# Patient Record
Sex: Female | Born: 1975
Health system: Southern US, Community
[De-identification: ages and names within clinical notes are randomized; demographics above are authoritative.]

## PROBLEM LIST (undated history)

## (undated) DIAGNOSIS — F988 Other specified behavioral and emotional disorders with onset usually occurring in childhood and adolescence: Secondary | ICD-10-CM

## (undated) DIAGNOSIS — B019 Varicella without complication: Secondary | ICD-10-CM

## (undated) DIAGNOSIS — U071 COVID-19: Secondary | ICD-10-CM

## (undated) DIAGNOSIS — F32A Depression, unspecified: Secondary | ICD-10-CM

## (undated) DIAGNOSIS — N393 Stress incontinence (female) (male): Secondary | ICD-10-CM

## (undated) DIAGNOSIS — M869 Osteomyelitis, unspecified: Secondary | ICD-10-CM

## (undated) DIAGNOSIS — J45909 Unspecified asthma, uncomplicated: Secondary | ICD-10-CM

## (undated) DIAGNOSIS — F329 Major depressive disorder, single episode, unspecified: Secondary | ICD-10-CM

## (undated) DIAGNOSIS — F419 Anxiety disorder, unspecified: Secondary | ICD-10-CM

## (undated) DIAGNOSIS — G7100 Muscular dystrophy, unspecified: Secondary | ICD-10-CM

## (undated) DIAGNOSIS — J309 Allergic rhinitis, unspecified: Secondary | ICD-10-CM

## (undated) DIAGNOSIS — S93326A Dislocation of tarsometatarsal joint of unspecified foot, initial encounter: Secondary | ICD-10-CM

## (undated) DIAGNOSIS — G600 Hereditary motor and sensory neuropathy: Secondary | ICD-10-CM

## (undated) HISTORY — DX: Osteomyelitis, unspecified: M86.9

## (undated) HISTORY — DX: Anxiety disorder, unspecified: F41.9

## (undated) HISTORY — DX: Unspecified asthma, uncomplicated: J45.909

## (undated) HISTORY — DX: Major depressive disorder, single episode, unspecified: F32.9

## (undated) HISTORY — DX: Other specified behavioral and emotional disorders with onset usually occurring in childhood and adolescence: F98.8

## (undated) HISTORY — DX: COVID-19: U07.1

## (undated) HISTORY — DX: Allergic rhinitis, unspecified: J30.9

## (undated) HISTORY — DX: Varicella without complication: B01.9

## (undated) HISTORY — DX: Hereditary motor and sensory neuropathy: G60.0

## (undated) HISTORY — DX: Dislocation of tarsometatarsal joint of unspecified foot, initial encounter: S93.326A

## (undated) HISTORY — DX: Depression, unspecified: F32.A

## (undated) HISTORY — DX: Muscular dystrophy, unspecified: G71.00

## (undated) HISTORY — DX: Stress incontinence (female) (male): N39.3

---
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
## 1993-01-18 HISTORY — PX: ECTOPIC PREGNANCY SURGERY: SHX613

## 2003-01-19 HISTORY — PX: TUBAL LIGATION: SHX77

## 2004-03-13 ENCOUNTER — Ambulatory Visit: Payer: Self-pay | Admitting: Internal Medicine

## 2004-07-19 ENCOUNTER — Emergency Department: Payer: Self-pay | Admitting: Emergency Medicine

## 2004-12-24 ENCOUNTER — Ambulatory Visit: Payer: Self-pay | Admitting: Unknown Physician Specialty

## 2005-01-25 ENCOUNTER — Ambulatory Visit: Payer: Self-pay | Admitting: Pain Medicine

## 2005-03-03 ENCOUNTER — Ambulatory Visit: Payer: Self-pay | Admitting: Pain Medicine

## 2007-02-22 ENCOUNTER — Emergency Department: Payer: Self-pay | Admitting: Emergency Medicine

## 2007-11-03 ENCOUNTER — Emergency Department: Payer: Self-pay | Admitting: Emergency Medicine

## 2008-06-05 ENCOUNTER — Emergency Department: Payer: Self-pay | Admitting: Emergency Medicine

## 2008-07-25 ENCOUNTER — Emergency Department: Payer: Self-pay | Admitting: Emergency Medicine

## 2008-08-01 ENCOUNTER — Emergency Department: Payer: Self-pay | Admitting: Unknown Physician Specialty

## 2008-11-21 ENCOUNTER — Ambulatory Visit: Payer: Self-pay | Admitting: Internal Medicine

## 2008-12-05 ENCOUNTER — Ambulatory Visit: Payer: Self-pay | Admitting: Internal Medicine

## 2008-12-20 ENCOUNTER — Ambulatory Visit: Payer: Self-pay | Admitting: Podiatrist

## 2009-03-31 ENCOUNTER — Ambulatory Visit: Payer: Self-pay | Admitting: Family Medicine

## 2009-04-18 HISTORY — PX: OTHER SURGICAL HISTORY: SHX169

## 2009-05-12 ENCOUNTER — Inpatient Hospital Stay: Payer: Self-pay | Admitting: Internal Medicine

## 2009-07-12 ENCOUNTER — Emergency Department: Payer: Self-pay | Admitting: Unknown Physician Specialty

## 2009-07-15 ENCOUNTER — Ambulatory Visit: Payer: Self-pay | Admitting: Podiatrist

## 2009-11-14 ENCOUNTER — Emergency Department: Payer: Self-pay | Admitting: Emergency Medicine

## 2010-03-17 ENCOUNTER — Other Ambulatory Visit: Payer: Self-pay | Admitting: Podiatrist

## 2010-03-17 DIAGNOSIS — M869 Osteomyelitis, unspecified: Secondary | ICD-10-CM

## 2010-03-17 DIAGNOSIS — S92309A Fracture of unspecified metatarsal bone(s), unspecified foot, initial encounter for closed fracture: Secondary | ICD-10-CM

## 2010-03-20 ENCOUNTER — Ambulatory Visit
Admission: RE | Admit: 2010-03-20 | Discharge: 2010-03-20 | Disposition: A | Discharge status: Home / Self Care | Payer: PRIVATE HEALTH INSURANCE | Source: Ambulatory Visit | Attending: Podiatrist | Admitting: Podiatrist

## 2010-03-20 DIAGNOSIS — M869 Osteomyelitis, unspecified: Secondary | ICD-10-CM

## 2010-03-20 DIAGNOSIS — S92309A Fracture of unspecified metatarsal bone(s), unspecified foot, initial encounter for closed fracture: Secondary | ICD-10-CM

## 2010-03-20 MED ORDER — GADOBENATE DIMEGLUMINE 529 MG/ML IV SOLN: 6.0000 mL | Freq: Once | INTRAVENOUS | Status: AC | PRN

## 2010-07-06 ENCOUNTER — Emergency Department: Payer: Self-pay

## 2010-07-16 ENCOUNTER — Ambulatory Visit: Payer: Self-pay

## 2010-07-21 ENCOUNTER — Ambulatory Visit: Payer: No Typology Code available for payment source | Admitting: Family Medicine

## 2010-07-21 DIAGNOSIS — Z0289 Encounter for other administrative examinations: Secondary | ICD-10-CM

## 2010-10-24 ENCOUNTER — Emergency Department: Payer: Self-pay | Admitting: Emergency Medicine

## 2010-12-20 ENCOUNTER — Emergency Department: Payer: Self-pay | Admitting: Emergency Medicine

## 2010-12-21 DIAGNOSIS — D499 Neoplasm of unspecified behavior of unspecified site: Secondary | ICD-10-CM | POA: Insufficient documentation

## 2010-12-21 DIAGNOSIS — G130 Paraneoplastic neuromyopathy and neuropathy: Secondary | ICD-10-CM

## 2010-12-21 DIAGNOSIS — L97519 Non-pressure chronic ulcer of other part of right foot with unspecified severity: Secondary | ICD-10-CM | POA: Insufficient documentation

## 2010-12-21 DIAGNOSIS — L97509 Non-pressure chronic ulcer of other part of unspecified foot with unspecified severity: Secondary | ICD-10-CM | POA: Insufficient documentation

## 2011-03-04 DIAGNOSIS — M2060 Acquired deformities of toe(s), unspecified, unspecified foot: Secondary | ICD-10-CM | POA: Insufficient documentation

## 2011-03-18 DIAGNOSIS — M1460 Charcot's joint, unspecified site: Secondary | ICD-10-CM | POA: Insufficient documentation

## 2011-03-18 DIAGNOSIS — S93326A Dislocation of tarsometatarsal joint of unspecified foot, initial encounter: Secondary | ICD-10-CM | POA: Insufficient documentation

## 2011-04-08 DIAGNOSIS — G622 Polyneuropathy due to other toxic agents: Secondary | ICD-10-CM

## 2011-04-08 DIAGNOSIS — G619 Inflammatory polyneuropathy, unspecified: Secondary | ICD-10-CM | POA: Insufficient documentation

## 2011-07-23 ENCOUNTER — Emergency Department: Payer: Self-pay | Admitting: Emergency Medicine

## 2011-07-23 LAB — CBC WITH DIFFERENTIAL/PLATELET
Basophil #: 0.1 10*3/uL (ref 0.0–0.1)
Basophil %: 0.6 %
Eosinophil #: 0 10*3/uL (ref 0.0–0.7)
Eosinophil %: 0.4 %
HCT: 38.7 % (ref 35.0–47.0)
HGB: 12.4 g/dL (ref 12.0–16.0)
Lymphocyte #: 2.3 10*3/uL (ref 1.0–3.6)
Lymphocyte %: 25.1 %
MCH: 29.3 pg (ref 26.0–34.0)
MCHC: 32.1 g/dL (ref 32.0–36.0)
MCV: 91 fL (ref 80–100)
Monocyte #: 0.4 x10 3/mm (ref 0.2–0.9)
Monocyte %: 4.8 %
Neutrophil #: 6.3 10*3/uL (ref 1.4–6.5)
Neutrophil %: 69.1 %
Platelet: 296 10*3/uL (ref 150–440)
RBC: 4.24 10*6/uL (ref 3.80–5.20)
RDW: 18.3 % — ABNORMAL HIGH (ref 11.5–14.5)
WBC: 9.1 10*3/uL (ref 3.6–11.0)

## 2011-07-23 LAB — BASIC METABOLIC PANEL
Anion Gap: 3 — ABNORMAL LOW (ref 7–16)
BUN: 4 mg/dL — ABNORMAL LOW (ref 7–18)
Calcium, Total: 8.7 mg/dL (ref 8.5–10.1)
Chloride: 103 mmol/L (ref 98–107)
Co2: 29 mmol/L (ref 21–32)
Creatinine: 0.6 mg/dL (ref 0.60–1.30)
EGFR (African American): >60
EGFR (Non-African Amer.): >60
Glucose: 115 mg/dL — ABNORMAL HIGH (ref 65–99)
Osmolality: 268 (ref 275–301)
Potassium: 3.4 mmol/L — ABNORMAL LOW (ref 3.5–5.1)
Sodium: 135 mmol/L — ABNORMAL LOW (ref 136–145)

## 2011-07-23 LAB — SEDIMENTATION RATE: Erythrocyte Sed Rate: 33 mm/hr — ABNORMAL HIGH (ref 0–20)

## 2011-08-04 ENCOUNTER — Emergency Department: Payer: Self-pay | Admitting: *Deleted

## 2011-08-04 ENCOUNTER — Ambulatory Visit: Payer: Self-pay | Admitting: Family Medicine

## 2011-09-30 DIAGNOSIS — Z79899 Other long term (current) drug therapy: Secondary | ICD-10-CM | POA: Insufficient documentation

## 2011-10-21 DIAGNOSIS — IMO0002 Reserved for concepts with insufficient information to code with codable children: Secondary | ICD-10-CM | POA: Insufficient documentation

## 2011-10-21 DIAGNOSIS — L03119 Cellulitis of unspecified part of limb: Secondary | ICD-10-CM

## 2011-10-21 DIAGNOSIS — L02619 Cutaneous abscess of unspecified foot: Secondary | ICD-10-CM | POA: Insufficient documentation

## 2011-10-22 ENCOUNTER — Emergency Department: Payer: Self-pay | Admitting: Internal Medicine

## 2011-10-22 LAB — CBC WITH DIFFERENTIAL/PLATELET
Basophil #: 0.1 10*3/uL (ref 0.0–0.1)
Basophil %: 1.3 %
Eosinophil #: 0.1 10*3/uL (ref 0.0–0.7)
Eosinophil %: 1 %
HCT: 36.2 % (ref 35.0–47.0)
HGB: 11.8 g/dL — ABNORMAL LOW (ref 12.0–16.0)
Lymphocyte #: 2.2 10*3/uL (ref 1.0–3.6)
Lymphocyte %: 29.4 %
MCH: 27.7 pg (ref 26.0–34.0)
MCHC: 32.7 g/dL (ref 32.0–36.0)
MCV: 85 fL (ref 80–100)
Monocyte #: 0.6 x10 3/mm (ref 0.2–0.9)
Monocyte %: 8.4 %
Neutrophil #: 4.4 10*3/uL (ref 1.4–6.5)
Neutrophil %: 59.9 %
Platelet: 411 10*3/uL (ref 150–440)
RBC: 4.27 10*6/uL (ref 3.80–5.20)
RDW: 17.3 % — ABNORMAL HIGH (ref 11.5–14.5)
WBC: 7.4 10*3/uL (ref 3.6–11.0)

## 2011-10-22 LAB — BASIC METABOLIC PANEL
Anion Gap: 9 (ref 7–16)
BUN: 4 mg/dL — ABNORMAL LOW (ref 7–18)
Calcium, Total: 8.9 mg/dL (ref 8.5–10.1)
Chloride: 104 mmol/L (ref 98–107)
Co2: 25 mmol/L (ref 21–32)
Creatinine: 0.53 mg/dL — ABNORMAL LOW (ref 0.60–1.30)
EGFR (African American): >60
EGFR (Non-African Amer.): >60
Glucose: 141 mg/dL — ABNORMAL HIGH (ref 65–99)
Osmolality: 275 (ref 275–301)
Potassium: 3.8 mmol/L (ref 3.5–5.1)
Sodium: 138 mmol/L (ref 136–145)

## 2012-06-25 ENCOUNTER — Emergency Department: Payer: Self-pay | Admitting: Unknown Physician Specialty

## 2012-08-11 ENCOUNTER — Emergency Department: Payer: Self-pay | Admitting: Emergency Medicine

## 2012-08-25 DIAGNOSIS — G8929 Other chronic pain: Secondary | ICD-10-CM | POA: Insufficient documentation

## 2012-08-25 DIAGNOSIS — M79673 Pain in unspecified foot: Secondary | ICD-10-CM | POA: Insufficient documentation

## 2012-09-18 ENCOUNTER — Emergency Department: Payer: Self-pay | Admitting: Emergency Medicine

## 2013-01-25 DIAGNOSIS — M545 Low back pain, unspecified: Secondary | ICD-10-CM | POA: Diagnosis not present

## 2013-01-25 DIAGNOSIS — G7109 Other specified muscular dystrophies: Secondary | ICD-10-CM | POA: Diagnosis not present

## 2013-01-25 DIAGNOSIS — M79609 Pain in unspecified limb: Secondary | ICD-10-CM | POA: Diagnosis not present

## 2013-01-25 DIAGNOSIS — F17200 Nicotine dependence, unspecified, uncomplicated: Secondary | ICD-10-CM | POA: Diagnosis not present

## 2013-01-25 DIAGNOSIS — Z79899 Other long term (current) drug therapy: Secondary | ICD-10-CM | POA: Diagnosis not present

## 2013-01-25 DIAGNOSIS — Z87891 Personal history of nicotine dependence: Secondary | ICD-10-CM | POA: Diagnosis not present

## 2013-02-07 DIAGNOSIS — F17200 Nicotine dependence, unspecified, uncomplicated: Secondary | ICD-10-CM | POA: Diagnosis not present

## 2013-02-07 DIAGNOSIS — M545 Low back pain, unspecified: Secondary | ICD-10-CM | POA: Diagnosis not present

## 2013-02-07 DIAGNOSIS — M79609 Pain in unspecified limb: Secondary | ICD-10-CM | POA: Diagnosis not present

## 2013-02-07 DIAGNOSIS — Z87891 Personal history of nicotine dependence: Secondary | ICD-10-CM | POA: Diagnosis not present

## 2013-02-07 DIAGNOSIS — Z79899 Other long term (current) drug therapy: Secondary | ICD-10-CM | POA: Diagnosis not present

## 2013-02-07 DIAGNOSIS — G7109 Other specified muscular dystrophies: Secondary | ICD-10-CM | POA: Diagnosis not present

## 2013-02-27 DIAGNOSIS — Z87891 Personal history of nicotine dependence: Secondary | ICD-10-CM | POA: Diagnosis not present

## 2013-02-27 DIAGNOSIS — Z79899 Other long term (current) drug therapy: Secondary | ICD-10-CM | POA: Diagnosis not present

## 2013-02-27 DIAGNOSIS — F17200 Nicotine dependence, unspecified, uncomplicated: Secondary | ICD-10-CM | POA: Diagnosis not present

## 2013-02-27 DIAGNOSIS — M79609 Pain in unspecified limb: Secondary | ICD-10-CM | POA: Diagnosis not present

## 2013-02-27 DIAGNOSIS — G7109 Other specified muscular dystrophies: Secondary | ICD-10-CM | POA: Diagnosis not present

## 2013-02-27 DIAGNOSIS — M545 Low back pain, unspecified: Secondary | ICD-10-CM | POA: Diagnosis not present

## 2013-03-19 ENCOUNTER — Ambulatory Visit: Payer: Self-pay | Admitting: Physician Assistant

## 2013-03-19 DIAGNOSIS — R059 Cough, unspecified: Secondary | ICD-10-CM | POA: Diagnosis not present

## 2013-03-19 DIAGNOSIS — J020 Streptococcal pharyngitis: Secondary | ICD-10-CM | POA: Diagnosis not present

## 2013-03-19 DIAGNOSIS — M869 Osteomyelitis, unspecified: Secondary | ICD-10-CM | POA: Diagnosis not present

## 2013-03-19 DIAGNOSIS — R05 Cough: Secondary | ICD-10-CM | POA: Diagnosis not present

## 2013-03-19 DIAGNOSIS — J069 Acute upper respiratory infection, unspecified: Secondary | ICD-10-CM | POA: Diagnosis not present

## 2013-03-19 DIAGNOSIS — Z79899 Other long term (current) drug therapy: Secondary | ICD-10-CM | POA: Diagnosis not present

## 2013-03-19 LAB — RAPID STREP-A WITH REFLX: Micro Text Report: POSITIVE

## 2013-03-21 DIAGNOSIS — M545 Low back pain, unspecified: Secondary | ICD-10-CM | POA: Diagnosis not present

## 2013-03-21 DIAGNOSIS — M79609 Pain in unspecified limb: Secondary | ICD-10-CM | POA: Diagnosis not present

## 2013-03-21 DIAGNOSIS — Z87891 Personal history of nicotine dependence: Secondary | ICD-10-CM | POA: Diagnosis not present

## 2013-03-21 DIAGNOSIS — G7109 Other specified muscular dystrophies: Secondary | ICD-10-CM | POA: Diagnosis not present

## 2013-03-21 DIAGNOSIS — F17200 Nicotine dependence, unspecified, uncomplicated: Secondary | ICD-10-CM | POA: Diagnosis not present

## 2013-03-21 DIAGNOSIS — Z79899 Other long term (current) drug therapy: Secondary | ICD-10-CM | POA: Diagnosis not present

## 2013-03-23 DIAGNOSIS — M79609 Pain in unspecified limb: Secondary | ICD-10-CM | POA: Diagnosis not present

## 2013-03-23 DIAGNOSIS — G7109 Other specified muscular dystrophies: Secondary | ICD-10-CM | POA: Diagnosis not present

## 2013-03-23 DIAGNOSIS — M545 Low back pain, unspecified: Secondary | ICD-10-CM | POA: Diagnosis not present

## 2013-03-23 DIAGNOSIS — Z79899 Other long term (current) drug therapy: Secondary | ICD-10-CM | POA: Diagnosis not present

## 2013-03-23 DIAGNOSIS — Z87891 Personal history of nicotine dependence: Secondary | ICD-10-CM | POA: Diagnosis not present

## 2013-03-23 DIAGNOSIS — F17200 Nicotine dependence, unspecified, uncomplicated: Secondary | ICD-10-CM | POA: Diagnosis not present

## 2013-04-02 DIAGNOSIS — M545 Low back pain, unspecified: Secondary | ICD-10-CM | POA: Diagnosis not present

## 2013-04-02 DIAGNOSIS — F17200 Nicotine dependence, unspecified, uncomplicated: Secondary | ICD-10-CM | POA: Diagnosis not present

## 2013-04-02 DIAGNOSIS — M79609 Pain in unspecified limb: Secondary | ICD-10-CM | POA: Diagnosis not present

## 2013-04-02 DIAGNOSIS — Z87891 Personal history of nicotine dependence: Secondary | ICD-10-CM | POA: Diagnosis not present

## 2013-04-02 DIAGNOSIS — Z79899 Other long term (current) drug therapy: Secondary | ICD-10-CM | POA: Diagnosis not present

## 2013-04-02 DIAGNOSIS — G894 Chronic pain syndrome: Secondary | ICD-10-CM | POA: Diagnosis not present

## 2013-04-02 DIAGNOSIS — G7109 Other specified muscular dystrophies: Secondary | ICD-10-CM | POA: Diagnosis not present

## 2013-04-05 DIAGNOSIS — G8929 Other chronic pain: Secondary | ICD-10-CM | POA: Diagnosis not present

## 2013-04-05 DIAGNOSIS — L989 Disorder of the skin and subcutaneous tissue, unspecified: Secondary | ICD-10-CM | POA: Diagnosis not present

## 2013-04-05 DIAGNOSIS — Z91199 Patient's noncompliance with other medical treatment and regimen due to unspecified reason: Secondary | ICD-10-CM | POA: Diagnosis not present

## 2013-04-05 DIAGNOSIS — L97509 Non-pressure chronic ulcer of other part of unspecified foot with unspecified severity: Secondary | ICD-10-CM | POA: Diagnosis not present

## 2013-04-05 DIAGNOSIS — Z9119 Patient's noncompliance with other medical treatment and regimen: Secondary | ICD-10-CM | POA: Diagnosis not present

## 2013-04-15 ENCOUNTER — Emergency Department: Payer: Self-pay | Admitting: Internal Medicine

## 2013-04-15 DIAGNOSIS — L98499 Non-pressure chronic ulcer of skin of other sites with unspecified severity: Secondary | ICD-10-CM | POA: Diagnosis not present

## 2013-04-15 DIAGNOSIS — R110 Nausea: Secondary | ICD-10-CM | POA: Diagnosis not present

## 2013-04-15 DIAGNOSIS — S91309A Unspecified open wound, unspecified foot, initial encounter: Secondary | ICD-10-CM | POA: Diagnosis not present

## 2013-04-15 DIAGNOSIS — L97509 Non-pressure chronic ulcer of other part of unspecified foot with unspecified severity: Secondary | ICD-10-CM | POA: Diagnosis not present

## 2013-04-15 LAB — CBC WITH DIFFERENTIAL/PLATELET
Basophil #: 0.1 10*3/uL (ref 0.0–0.1)
Basophil %: 1 %
Eosinophil #: 0.1 10*3/uL (ref 0.0–0.7)
Eosinophil %: 0.9 %
HCT: 37.9 % (ref 35.0–47.0)
HGB: 12.6 g/dL (ref 12.0–16.0)
Lymphocyte #: 3.6 10*3/uL (ref 1.0–3.6)
Lymphocyte %: 38.1 %
MCH: 26.4 pg (ref 26.0–34.0)
MCHC: 33.2 g/dL (ref 32.0–36.0)
MCV: 79 fL — ABNORMAL LOW (ref 80–100)
Monocyte #: 0.5 x10 3/mm (ref 0.2–0.9)
Monocyte %: 5.5 %
Neutrophil #: 5.1 10*3/uL (ref 1.4–6.5)
Neutrophil %: 54.5 %
Platelet: 277 10*3/uL (ref 150–440)
RBC: 4.77 10*6/uL (ref 3.80–5.20)
RDW: 19.9 % — ABNORMAL HIGH (ref 11.5–14.5)
WBC: 9.4 10*3/uL (ref 3.6–11.0)

## 2013-04-15 LAB — COMPREHENSIVE METABOLIC PANEL
Albumin: 3.2 g/dL — ABNORMAL LOW (ref 3.4–5.0)
Alkaline Phosphatase: 64 U/L
Anion Gap: 4 — ABNORMAL LOW (ref 7–16)
BUN: 5 mg/dL — ABNORMAL LOW (ref 7–18)
Bilirubin,Total: 0.4 mg/dL (ref 0.2–1.0)
Calcium, Total: 8.7 mg/dL (ref 8.5–10.1)
Chloride: 101 mmol/L (ref 98–107)
Co2: 28 mmol/L (ref 21–32)
Creatinine: 0.57 mg/dL — ABNORMAL LOW (ref 0.60–1.30)
EGFR (African American): >60
EGFR (Non-African Amer.): >60
Glucose: 96 mg/dL (ref 65–99)
Osmolality: 263 (ref 275–301)
Potassium: 3.7 mmol/L (ref 3.5–5.1)
SGOT(AST): 8 U/L — ABNORMAL LOW (ref 15–37)
SGPT (ALT): 13 U/L (ref 12–78)
Sodium: 133 mmol/L — ABNORMAL LOW (ref 136–145)
Total Protein: 7.5 g/dL (ref 6.4–8.2)

## 2013-04-18 DIAGNOSIS — G600 Hereditary motor and sensory neuropathy: Secondary | ICD-10-CM | POA: Diagnosis not present

## 2013-04-24 DIAGNOSIS — Z79899 Other long term (current) drug therapy: Secondary | ICD-10-CM | POA: Diagnosis not present

## 2013-04-24 DIAGNOSIS — Z87891 Personal history of nicotine dependence: Secondary | ICD-10-CM | POA: Diagnosis not present

## 2013-04-24 DIAGNOSIS — F17200 Nicotine dependence, unspecified, uncomplicated: Secondary | ICD-10-CM | POA: Diagnosis not present

## 2013-04-24 DIAGNOSIS — M79609 Pain in unspecified limb: Secondary | ICD-10-CM | POA: Diagnosis not present

## 2013-04-24 DIAGNOSIS — M545 Low back pain, unspecified: Secondary | ICD-10-CM | POA: Diagnosis not present

## 2013-04-24 DIAGNOSIS — G7109 Other specified muscular dystrophies: Secondary | ICD-10-CM | POA: Diagnosis not present

## 2013-04-28 DIAGNOSIS — M948X9 Other specified disorders of cartilage, unspecified sites: Secondary | ICD-10-CM | POA: Diagnosis not present

## 2013-04-28 DIAGNOSIS — B958 Unspecified staphylococcus as the cause of diseases classified elsewhere: Secondary | ICD-10-CM | POA: Diagnosis not present

## 2013-04-28 DIAGNOSIS — M79609 Pain in unspecified limb: Secondary | ICD-10-CM | POA: Diagnosis not present

## 2013-04-28 DIAGNOSIS — T797XXA Traumatic subcutaneous emphysema, initial encounter: Secondary | ICD-10-CM | POA: Diagnosis not present

## 2013-04-28 DIAGNOSIS — S91109A Unspecified open wound of unspecified toe(s) without damage to nail, initial encounter: Secondary | ICD-10-CM | POA: Diagnosis not present

## 2013-04-28 DIAGNOSIS — M869 Osteomyelitis, unspecified: Secondary | ICD-10-CM | POA: Diagnosis not present

## 2013-04-28 DIAGNOSIS — S98119A Complete traumatic amputation of unspecified great toe, initial encounter: Secondary | ICD-10-CM | POA: Diagnosis not present

## 2013-04-28 DIAGNOSIS — I739 Peripheral vascular disease, unspecified: Secondary | ICD-10-CM | POA: Diagnosis not present

## 2013-04-28 DIAGNOSIS — L98499 Non-pressure chronic ulcer of skin of other sites with unspecified severity: Secondary | ICD-10-CM | POA: Diagnosis not present

## 2013-04-28 DIAGNOSIS — F17200 Nicotine dependence, unspecified, uncomplicated: Secondary | ICD-10-CM | POA: Diagnosis not present

## 2013-04-28 DIAGNOSIS — Z79899 Other long term (current) drug therapy: Secondary | ICD-10-CM | POA: Diagnosis not present

## 2013-04-28 DIAGNOSIS — L089 Local infection of the skin and subcutaneous tissue, unspecified: Secondary | ICD-10-CM | POA: Diagnosis not present

## 2013-05-08 DIAGNOSIS — F17200 Nicotine dependence, unspecified, uncomplicated: Secondary | ICD-10-CM | POA: Diagnosis not present

## 2013-05-08 DIAGNOSIS — Z87891 Personal history of nicotine dependence: Secondary | ICD-10-CM | POA: Diagnosis not present

## 2013-05-08 DIAGNOSIS — Z79899 Other long term (current) drug therapy: Secondary | ICD-10-CM | POA: Diagnosis not present

## 2013-05-08 DIAGNOSIS — M545 Low back pain, unspecified: Secondary | ICD-10-CM | POA: Diagnosis not present

## 2013-05-08 DIAGNOSIS — M79609 Pain in unspecified limb: Secondary | ICD-10-CM | POA: Diagnosis not present

## 2013-05-08 DIAGNOSIS — G7109 Other specified muscular dystrophies: Secondary | ICD-10-CM | POA: Diagnosis not present

## 2013-06-13 DIAGNOSIS — M545 Low back pain, unspecified: Secondary | ICD-10-CM | POA: Diagnosis not present

## 2013-06-13 DIAGNOSIS — Z87891 Personal history of nicotine dependence: Secondary | ICD-10-CM | POA: Diagnosis not present

## 2013-06-13 DIAGNOSIS — M79609 Pain in unspecified limb: Secondary | ICD-10-CM | POA: Diagnosis not present

## 2013-06-13 DIAGNOSIS — G7109 Other specified muscular dystrophies: Secondary | ICD-10-CM | POA: Diagnosis not present

## 2013-06-13 DIAGNOSIS — Z79899 Other long term (current) drug therapy: Secondary | ICD-10-CM | POA: Diagnosis not present

## 2013-06-13 DIAGNOSIS — F17200 Nicotine dependence, unspecified, uncomplicated: Secondary | ICD-10-CM | POA: Diagnosis not present

## 2013-07-09 ENCOUNTER — Emergency Department: Payer: Self-pay | Admitting: Emergency Medicine

## 2013-07-09 DIAGNOSIS — S98119A Complete traumatic amputation of unspecified great toe, initial encounter: Secondary | ICD-10-CM | POA: Diagnosis not present

## 2013-07-09 DIAGNOSIS — G7109 Other specified muscular dystrophies: Secondary | ICD-10-CM | POA: Diagnosis present

## 2013-07-09 DIAGNOSIS — R110 Nausea: Secondary | ICD-10-CM | POA: Diagnosis not present

## 2013-07-09 DIAGNOSIS — R9431 Abnormal electrocardiogram [ECG] [EKG]: Secondary | ICD-10-CM | POA: Diagnosis not present

## 2013-07-09 DIAGNOSIS — F329 Major depressive disorder, single episode, unspecified: Secondary | ICD-10-CM | POA: Diagnosis present

## 2013-07-09 DIAGNOSIS — Z8614 Personal history of Methicillin resistant Staphylococcus aureus infection: Secondary | ICD-10-CM | POA: Diagnosis not present

## 2013-07-09 DIAGNOSIS — I4510 Unspecified right bundle-branch block: Secondary | ICD-10-CM | POA: Diagnosis not present

## 2013-07-09 DIAGNOSIS — L97409 Non-pressure chronic ulcer of unspecified heel and midfoot with unspecified severity: Secondary | ICD-10-CM | POA: Diagnosis not present

## 2013-07-09 DIAGNOSIS — M869 Osteomyelitis, unspecified: Secondary | ICD-10-CM | POA: Diagnosis not present

## 2013-07-09 DIAGNOSIS — I96 Gangrene, not elsewhere classified: Secondary | ICD-10-CM | POA: Diagnosis present

## 2013-07-09 DIAGNOSIS — G8918 Other acute postprocedural pain: Secondary | ICD-10-CM | POA: Diagnosis not present

## 2013-07-09 DIAGNOSIS — L089 Local infection of the skin and subcutaneous tissue, unspecified: Secondary | ICD-10-CM | POA: Diagnosis not present

## 2013-07-09 DIAGNOSIS — G600 Hereditary motor and sensory neuropathy: Secondary | ICD-10-CM | POA: Diagnosis not present

## 2013-07-09 DIAGNOSIS — F3289 Other specified depressive episodes: Secondary | ICD-10-CM | POA: Diagnosis present

## 2013-07-09 DIAGNOSIS — F17200 Nicotine dependence, unspecified, uncomplicated: Secondary | ICD-10-CM | POA: Diagnosis not present

## 2013-07-09 DIAGNOSIS — L97509 Non-pressure chronic ulcer of other part of unspecified foot with unspecified severity: Secondary | ICD-10-CM | POA: Diagnosis not present

## 2013-07-09 DIAGNOSIS — M948X9 Other specified disorders of cartilage, unspecified sites: Secondary | ICD-10-CM | POA: Diagnosis not present

## 2013-07-09 DIAGNOSIS — Z79899 Other long term (current) drug therapy: Secondary | ICD-10-CM | POA: Diagnosis not present

## 2013-07-09 DIAGNOSIS — L02619 Cutaneous abscess of unspecified foot: Secondary | ICD-10-CM | POA: Diagnosis present

## 2013-07-09 DIAGNOSIS — M79609 Pain in unspecified limb: Secondary | ICD-10-CM | POA: Diagnosis not present

## 2013-07-09 DIAGNOSIS — Z880 Allergy status to penicillin: Secondary | ICD-10-CM | POA: Diagnosis not present

## 2013-07-09 DIAGNOSIS — L03039 Cellulitis of unspecified toe: Secondary | ICD-10-CM | POA: Diagnosis present

## 2013-07-09 DIAGNOSIS — G8929 Other chronic pain: Secondary | ICD-10-CM | POA: Diagnosis present

## 2013-07-09 DIAGNOSIS — L03119 Cellulitis of unspecified part of limb: Secondary | ICD-10-CM | POA: Diagnosis not present

## 2013-07-09 LAB — CBC WITH DIFFERENTIAL/PLATELET
Basophil #: 0.1 10*3/uL (ref 0.0–0.1)
Basophil %: 1.4 %
Eosinophil #: 0 10*3/uL (ref 0.0–0.7)
Eosinophil %: 0.5 %
HCT: 37.6 % (ref 35.0–47.0)
HGB: 12.3 g/dL (ref 12.0–16.0)
Lymphocyte #: 2.5 10*3/uL (ref 1.0–3.6)
Lymphocyte %: 25.9 %
MCH: 25.4 pg — ABNORMAL LOW (ref 26.0–34.0)
MCHC: 32.6 g/dL (ref 32.0–36.0)
MCV: 78 fL — ABNORMAL LOW (ref 80–100)
Monocyte #: 0.5 x10 3/mm (ref 0.2–0.9)
Monocyte %: 4.9 %
Neutrophil #: 6.5 10*3/uL (ref 1.4–6.5)
Neutrophil %: 67.3 %
Platelet: 341 10*3/uL (ref 150–440)
RBC: 4.83 10*6/uL (ref 3.80–5.20)
RDW: 18.2 % — ABNORMAL HIGH (ref 11.5–14.5)
WBC: 9.6 10*3/uL (ref 3.6–11.0)

## 2013-07-09 LAB — COMPREHENSIVE METABOLIC PANEL
Albumin: 2.7 g/dL — ABNORMAL LOW (ref 3.4–5.0)
Alkaline Phosphatase: 75 U/L
Anion Gap: 3 — ABNORMAL LOW (ref 7–16)
BUN: 3 mg/dL — ABNORMAL LOW (ref 7–18)
Bilirubin,Total: 0.3 mg/dL (ref 0.2–1.0)
Calcium, Total: 9.2 mg/dL (ref 8.5–10.1)
Chloride: 104 mmol/L (ref 98–107)
Co2: 28 mmol/L (ref 21–32)
Creatinine: 0.56 mg/dL — ABNORMAL LOW (ref 0.60–1.30)
EGFR (African American): >60
EGFR (Non-African Amer.): >60
Glucose: 94 mg/dL (ref 65–99)
Osmolality: 266 (ref 275–301)
Potassium: 4.8 mmol/L (ref 3.5–5.1)
SGOT(AST): 13 U/L — ABNORMAL LOW (ref 15–37)
SGPT (ALT): 9 U/L — ABNORMAL LOW (ref 12–78)
Sodium: 135 mmol/L — ABNORMAL LOW (ref 136–145)
Total Protein: 7.6 g/dL (ref 6.4–8.2)

## 2013-07-11 DIAGNOSIS — Z720 Tobacco use: Secondary | ICD-10-CM | POA: Insufficient documentation

## 2013-07-16 DIAGNOSIS — M869 Osteomyelitis, unspecified: Secondary | ICD-10-CM

## 2013-07-16 HISTORY — DX: Osteomyelitis, unspecified: M86.9

## 2013-07-18 DIAGNOSIS — G988 Other disorders of nervous system: Secondary | ICD-10-CM | POA: Diagnosis not present

## 2013-07-18 DIAGNOSIS — E1159 Type 2 diabetes mellitus with other circulatory complications: Secondary | ICD-10-CM | POA: Diagnosis not present

## 2013-07-18 DIAGNOSIS — T8740 Infection of amputation stump, unspecified extremity: Secondary | ICD-10-CM | POA: Diagnosis not present

## 2013-07-18 DIAGNOSIS — F17200 Nicotine dependence, unspecified, uncomplicated: Secondary | ICD-10-CM | POA: Diagnosis not present

## 2013-07-18 DIAGNOSIS — S98119A Complete traumatic amputation of unspecified great toe, initial encounter: Secondary | ICD-10-CM | POA: Diagnosis not present

## 2013-07-18 DIAGNOSIS — G600 Hereditary motor and sensory neuropathy: Secondary | ICD-10-CM | POA: Diagnosis not present

## 2013-07-18 DIAGNOSIS — I798 Other disorders of arteries, arterioles and capillaries in diseases classified elsewhere: Secondary | ICD-10-CM | POA: Diagnosis not present

## 2013-07-19 DIAGNOSIS — G988 Other disorders of nervous system: Secondary | ICD-10-CM | POA: Diagnosis not present

## 2013-07-19 DIAGNOSIS — G600 Hereditary motor and sensory neuropathy: Secondary | ICD-10-CM | POA: Diagnosis not present

## 2013-07-19 DIAGNOSIS — F17200 Nicotine dependence, unspecified, uncomplicated: Secondary | ICD-10-CM | POA: Diagnosis not present

## 2013-07-19 DIAGNOSIS — E1159 Type 2 diabetes mellitus with other circulatory complications: Secondary | ICD-10-CM | POA: Diagnosis not present

## 2013-07-19 DIAGNOSIS — T8740 Infection of amputation stump, unspecified extremity: Secondary | ICD-10-CM | POA: Diagnosis not present

## 2013-07-19 DIAGNOSIS — I798 Other disorders of arteries, arterioles and capillaries in diseases classified elsewhere: Secondary | ICD-10-CM | POA: Diagnosis not present

## 2013-07-20 DIAGNOSIS — G600 Hereditary motor and sensory neuropathy: Secondary | ICD-10-CM | POA: Diagnosis not present

## 2013-07-20 DIAGNOSIS — E1159 Type 2 diabetes mellitus with other circulatory complications: Secondary | ICD-10-CM | POA: Diagnosis not present

## 2013-07-20 DIAGNOSIS — G988 Other disorders of nervous system: Secondary | ICD-10-CM | POA: Diagnosis not present

## 2013-07-20 DIAGNOSIS — I798 Other disorders of arteries, arterioles and capillaries in diseases classified elsewhere: Secondary | ICD-10-CM | POA: Diagnosis not present

## 2013-07-20 DIAGNOSIS — F17200 Nicotine dependence, unspecified, uncomplicated: Secondary | ICD-10-CM | POA: Diagnosis not present

## 2013-07-20 DIAGNOSIS — T8740 Infection of amputation stump, unspecified extremity: Secondary | ICD-10-CM | POA: Diagnosis not present

## 2013-07-23 DIAGNOSIS — G600 Hereditary motor and sensory neuropathy: Secondary | ICD-10-CM | POA: Diagnosis not present

## 2013-07-23 DIAGNOSIS — G988 Other disorders of nervous system: Secondary | ICD-10-CM | POA: Diagnosis not present

## 2013-07-23 DIAGNOSIS — E1159 Type 2 diabetes mellitus with other circulatory complications: Secondary | ICD-10-CM | POA: Diagnosis not present

## 2013-07-23 DIAGNOSIS — T8740 Infection of amputation stump, unspecified extremity: Secondary | ICD-10-CM | POA: Diagnosis not present

## 2013-07-23 DIAGNOSIS — I798 Other disorders of arteries, arterioles and capillaries in diseases classified elsewhere: Secondary | ICD-10-CM | POA: Diagnosis not present

## 2013-07-23 DIAGNOSIS — F17200 Nicotine dependence, unspecified, uncomplicated: Secondary | ICD-10-CM | POA: Diagnosis not present

## 2013-07-25 DIAGNOSIS — G988 Other disorders of nervous system: Secondary | ICD-10-CM | POA: Diagnosis not present

## 2013-07-25 DIAGNOSIS — F17200 Nicotine dependence, unspecified, uncomplicated: Secondary | ICD-10-CM | POA: Diagnosis not present

## 2013-07-25 DIAGNOSIS — G600 Hereditary motor and sensory neuropathy: Secondary | ICD-10-CM | POA: Diagnosis not present

## 2013-07-25 DIAGNOSIS — I798 Other disorders of arteries, arterioles and capillaries in diseases classified elsewhere: Secondary | ICD-10-CM | POA: Diagnosis not present

## 2013-07-25 DIAGNOSIS — E1159 Type 2 diabetes mellitus with other circulatory complications: Secondary | ICD-10-CM | POA: Diagnosis not present

## 2013-07-25 DIAGNOSIS — T8740 Infection of amputation stump, unspecified extremity: Secondary | ICD-10-CM | POA: Diagnosis not present

## 2013-07-27 DIAGNOSIS — G600 Hereditary motor and sensory neuropathy: Secondary | ICD-10-CM | POA: Diagnosis not present

## 2013-07-27 DIAGNOSIS — T8740 Infection of amputation stump, unspecified extremity: Secondary | ICD-10-CM | POA: Diagnosis not present

## 2013-07-27 DIAGNOSIS — E1159 Type 2 diabetes mellitus with other circulatory complications: Secondary | ICD-10-CM | POA: Diagnosis not present

## 2013-07-27 DIAGNOSIS — F17200 Nicotine dependence, unspecified, uncomplicated: Secondary | ICD-10-CM | POA: Diagnosis not present

## 2013-07-27 DIAGNOSIS — G988 Other disorders of nervous system: Secondary | ICD-10-CM | POA: Diagnosis not present

## 2013-07-27 DIAGNOSIS — I798 Other disorders of arteries, arterioles and capillaries in diseases classified elsewhere: Secondary | ICD-10-CM | POA: Diagnosis not present

## 2013-07-30 DIAGNOSIS — F17200 Nicotine dependence, unspecified, uncomplicated: Secondary | ICD-10-CM | POA: Diagnosis not present

## 2013-07-30 DIAGNOSIS — E1159 Type 2 diabetes mellitus with other circulatory complications: Secondary | ICD-10-CM | POA: Diagnosis not present

## 2013-07-30 DIAGNOSIS — T8740 Infection of amputation stump, unspecified extremity: Secondary | ICD-10-CM | POA: Diagnosis not present

## 2013-07-30 DIAGNOSIS — I798 Other disorders of arteries, arterioles and capillaries in diseases classified elsewhere: Secondary | ICD-10-CM | POA: Diagnosis not present

## 2013-07-30 DIAGNOSIS — G988 Other disorders of nervous system: Secondary | ICD-10-CM | POA: Diagnosis not present

## 2013-07-30 DIAGNOSIS — G600 Hereditary motor and sensory neuropathy: Secondary | ICD-10-CM | POA: Diagnosis not present

## 2013-08-01 DIAGNOSIS — L97509 Non-pressure chronic ulcer of other part of unspecified foot with unspecified severity: Secondary | ICD-10-CM | POA: Diagnosis not present

## 2013-08-03 DIAGNOSIS — E1159 Type 2 diabetes mellitus with other circulatory complications: Secondary | ICD-10-CM | POA: Diagnosis not present

## 2013-08-03 DIAGNOSIS — G988 Other disorders of nervous system: Secondary | ICD-10-CM | POA: Diagnosis not present

## 2013-08-03 DIAGNOSIS — I798 Other disorders of arteries, arterioles and capillaries in diseases classified elsewhere: Secondary | ICD-10-CM | POA: Diagnosis not present

## 2013-08-03 DIAGNOSIS — T8740 Infection of amputation stump, unspecified extremity: Secondary | ICD-10-CM | POA: Diagnosis not present

## 2013-08-03 DIAGNOSIS — G600 Hereditary motor and sensory neuropathy: Secondary | ICD-10-CM | POA: Diagnosis not present

## 2013-08-03 DIAGNOSIS — F17200 Nicotine dependence, unspecified, uncomplicated: Secondary | ICD-10-CM | POA: Diagnosis not present

## 2013-08-08 DIAGNOSIS — G600 Hereditary motor and sensory neuropathy: Secondary | ICD-10-CM | POA: Diagnosis not present

## 2013-08-08 DIAGNOSIS — T8789 Other complications of amputation stump: Secondary | ICD-10-CM | POA: Diagnosis not present

## 2013-08-08 DIAGNOSIS — M869 Osteomyelitis, unspecified: Secondary | ICD-10-CM | POA: Diagnosis not present

## 2013-08-08 DIAGNOSIS — G589 Mononeuropathy, unspecified: Secondary | ICD-10-CM | POA: Diagnosis not present

## 2013-08-08 DIAGNOSIS — L97509 Non-pressure chronic ulcer of other part of unspecified foot with unspecified severity: Secondary | ICD-10-CM | POA: Diagnosis not present

## 2013-08-13 DIAGNOSIS — F17200 Nicotine dependence, unspecified, uncomplicated: Secondary | ICD-10-CM | POA: Diagnosis not present

## 2013-08-13 DIAGNOSIS — G988 Other disorders of nervous system: Secondary | ICD-10-CM | POA: Diagnosis not present

## 2013-08-13 DIAGNOSIS — T8740 Infection of amputation stump, unspecified extremity: Secondary | ICD-10-CM | POA: Diagnosis not present

## 2013-08-13 DIAGNOSIS — E1159 Type 2 diabetes mellitus with other circulatory complications: Secondary | ICD-10-CM | POA: Diagnosis not present

## 2013-08-13 DIAGNOSIS — G600 Hereditary motor and sensory neuropathy: Secondary | ICD-10-CM | POA: Diagnosis not present

## 2013-08-13 DIAGNOSIS — I798 Other disorders of arteries, arterioles and capillaries in diseases classified elsewhere: Secondary | ICD-10-CM | POA: Diagnosis not present

## 2013-08-15 DIAGNOSIS — T8740 Infection of amputation stump, unspecified extremity: Secondary | ICD-10-CM | POA: Diagnosis not present

## 2013-08-16 DIAGNOSIS — L8990 Pressure ulcer of unspecified site, unspecified stage: Secondary | ICD-10-CM | POA: Diagnosis not present

## 2013-08-16 DIAGNOSIS — G5790 Unspecified mononeuropathy of unspecified lower limb: Secondary | ICD-10-CM | POA: Diagnosis not present

## 2013-08-16 DIAGNOSIS — T8789 Other complications of amputation stump: Secondary | ICD-10-CM | POA: Diagnosis not present

## 2013-08-16 DIAGNOSIS — L97509 Non-pressure chronic ulcer of other part of unspecified foot with unspecified severity: Secondary | ICD-10-CM | POA: Diagnosis not present

## 2013-08-16 DIAGNOSIS — L89899 Pressure ulcer of other site, unspecified stage: Secondary | ICD-10-CM | POA: Diagnosis not present

## 2013-08-16 DIAGNOSIS — Z8739 Personal history of other diseases of the musculoskeletal system and connective tissue: Secondary | ICD-10-CM | POA: Diagnosis not present

## 2013-08-16 DIAGNOSIS — Z8619 Personal history of other infectious and parasitic diseases: Secondary | ICD-10-CM | POA: Diagnosis not present

## 2013-08-16 DIAGNOSIS — G600 Hereditary motor and sensory neuropathy: Secondary | ICD-10-CM | POA: Diagnosis not present

## 2013-08-21 DIAGNOSIS — F17200 Nicotine dependence, unspecified, uncomplicated: Secondary | ICD-10-CM | POA: Diagnosis not present

## 2013-08-21 DIAGNOSIS — G8918 Other acute postprocedural pain: Secondary | ICD-10-CM | POA: Diagnosis not present

## 2013-08-21 DIAGNOSIS — G894 Chronic pain syndrome: Secondary | ICD-10-CM | POA: Diagnosis not present

## 2013-08-21 DIAGNOSIS — M79609 Pain in unspecified limb: Secondary | ICD-10-CM | POA: Diagnosis not present

## 2013-08-21 DIAGNOSIS — Z79899 Other long term (current) drug therapy: Secondary | ICD-10-CM | POA: Diagnosis not present

## 2013-08-21 DIAGNOSIS — Z87891 Personal history of nicotine dependence: Secondary | ICD-10-CM | POA: Diagnosis not present

## 2013-08-25 ENCOUNTER — Emergency Department: Payer: Self-pay | Admitting: Emergency Medicine

## 2013-08-25 DIAGNOSIS — Z880 Allergy status to penicillin: Secondary | ICD-10-CM | POA: Diagnosis not present

## 2013-08-25 DIAGNOSIS — S98119A Complete traumatic amputation of unspecified great toe, initial encounter: Secondary | ICD-10-CM | POA: Diagnosis not present

## 2013-08-25 DIAGNOSIS — M869 Osteomyelitis, unspecified: Secondary | ICD-10-CM | POA: Diagnosis not present

## 2013-08-25 DIAGNOSIS — M79609 Pain in unspecified limb: Secondary | ICD-10-CM | POA: Diagnosis not present

## 2013-08-25 DIAGNOSIS — S88919A Complete traumatic amputation of unspecified lower leg, level unspecified, initial encounter: Secondary | ICD-10-CM | POA: Diagnosis not present

## 2013-08-25 DIAGNOSIS — M7730 Calcaneal spur, unspecified foot: Secondary | ICD-10-CM | POA: Diagnosis not present

## 2013-08-25 DIAGNOSIS — M25579 Pain in unspecified ankle and joints of unspecified foot: Secondary | ICD-10-CM | POA: Diagnosis not present

## 2013-08-25 DIAGNOSIS — F17200 Nicotine dependence, unspecified, uncomplicated: Secondary | ICD-10-CM | POA: Diagnosis not present

## 2013-08-25 LAB — BASIC METABOLIC PANEL
Anion Gap: 6 — ABNORMAL LOW (ref 7–16)
BUN: 5 mg/dL — ABNORMAL LOW (ref 7–18)
Calcium, Total: 8.5 mg/dL (ref 8.5–10.1)
Chloride: 105 mmol/L (ref 98–107)
Co2: 26 mmol/L (ref 21–32)
Creatinine: 0.5 mg/dL — ABNORMAL LOW (ref 0.60–1.30)
EGFR (African American): >60
EGFR (Non-African Amer.): >60
Glucose: 98 mg/dL (ref 65–99)
Osmolality: 271 (ref 275–301)
Potassium: 3.8 mmol/L (ref 3.5–5.1)
Sodium: 137 mmol/L (ref 136–145)

## 2013-08-25 LAB — CBC WITH DIFFERENTIAL/PLATELET
Basophil #: 0.1 10*3/uL (ref 0.0–0.1)
Basophil %: 0.5 %
Eosinophil #: 0 10*3/uL (ref 0.0–0.7)
Eosinophil %: 0.1 %
HCT: 36.1 % (ref 35.0–47.0)
HGB: 11.6 g/dL — ABNORMAL LOW (ref 12.0–16.0)
Lymphocyte #: 1.4 10*3/uL (ref 1.0–3.6)
Lymphocyte %: 10.1 %
MCH: 24.5 pg — ABNORMAL LOW (ref 26.0–34.0)
MCHC: 32.2 g/dL (ref 32.0–36.0)
MCV: 76 fL — ABNORMAL LOW (ref 80–100)
Monocyte #: 0.6 x10 3/mm (ref 0.2–0.9)
Monocyte %: 4.4 %
Neutrophil #: 12.2 10*3/uL — ABNORMAL HIGH (ref 1.4–6.5)
Neutrophil %: 84.9 %
Platelet: 290 10*3/uL (ref 150–440)
RBC: 4.75 10*6/uL (ref 3.80–5.20)
RDW: 19 % — ABNORMAL HIGH (ref 11.5–14.5)
WBC: 14.3 10*3/uL — ABNORMAL HIGH (ref 3.6–11.0)

## 2013-08-27 DIAGNOSIS — L97509 Non-pressure chronic ulcer of other part of unspecified foot with unspecified severity: Secondary | ICD-10-CM | POA: Diagnosis not present

## 2013-08-27 DIAGNOSIS — G600 Hereditary motor and sensory neuropathy: Secondary | ICD-10-CM | POA: Diagnosis not present

## 2013-08-27 DIAGNOSIS — G8929 Other chronic pain: Secondary | ICD-10-CM | POA: Diagnosis not present

## 2013-08-27 DIAGNOSIS — F17200 Nicotine dependence, unspecified, uncomplicated: Secondary | ICD-10-CM | POA: Diagnosis not present

## 2013-08-30 DIAGNOSIS — F17200 Nicotine dependence, unspecified, uncomplicated: Secondary | ICD-10-CM | POA: Diagnosis not present

## 2013-08-30 DIAGNOSIS — A5211 Tabes dorsalis: Secondary | ICD-10-CM | POA: Diagnosis not present

## 2013-08-30 DIAGNOSIS — G894 Chronic pain syndrome: Secondary | ICD-10-CM | POA: Diagnosis not present

## 2013-08-30 DIAGNOSIS — Z79899 Other long term (current) drug therapy: Secondary | ICD-10-CM | POA: Diagnosis not present

## 2013-08-30 DIAGNOSIS — F4322 Adjustment disorder with anxiety: Secondary | ICD-10-CM | POA: Diagnosis not present

## 2013-08-30 DIAGNOSIS — R32 Unspecified urinary incontinence: Secondary | ICD-10-CM | POA: Diagnosis not present

## 2013-08-30 DIAGNOSIS — F321 Major depressive disorder, single episode, moderate: Secondary | ICD-10-CM | POA: Diagnosis not present

## 2013-09-12 ENCOUNTER — Ambulatory Visit: Payer: Self-pay | Admitting: Physician Assistant

## 2013-09-12 DIAGNOSIS — S93609A Unspecified sprain of unspecified foot, initial encounter: Secondary | ICD-10-CM | POA: Diagnosis not present

## 2013-09-12 DIAGNOSIS — M7730 Calcaneal spur, unspecified foot: Secondary | ICD-10-CM | POA: Diagnosis not present

## 2013-09-13 DIAGNOSIS — Z87891 Personal history of nicotine dependence: Secondary | ICD-10-CM | POA: Diagnosis not present

## 2013-09-13 DIAGNOSIS — G894 Chronic pain syndrome: Secondary | ICD-10-CM | POA: Diagnosis not present

## 2013-09-13 DIAGNOSIS — M79609 Pain in unspecified limb: Secondary | ICD-10-CM | POA: Diagnosis not present

## 2013-09-13 DIAGNOSIS — F17200 Nicotine dependence, unspecified, uncomplicated: Secondary | ICD-10-CM | POA: Diagnosis not present

## 2013-09-13 DIAGNOSIS — Z79899 Other long term (current) drug therapy: Secondary | ICD-10-CM | POA: Diagnosis not present

## 2013-09-13 DIAGNOSIS — G8918 Other acute postprocedural pain: Secondary | ICD-10-CM | POA: Diagnosis not present

## 2013-10-01 ENCOUNTER — Emergency Department: Payer: Self-pay | Admitting: Emergency Medicine

## 2013-10-01 DIAGNOSIS — K0510 Chronic gingivitis, plaque induced: Secondary | ICD-10-CM | POA: Diagnosis not present

## 2013-10-01 DIAGNOSIS — K089 Disorder of teeth and supporting structures, unspecified: Secondary | ICD-10-CM | POA: Diagnosis not present

## 2013-10-01 DIAGNOSIS — R6884 Jaw pain: Secondary | ICD-10-CM | POA: Diagnosis not present

## 2013-10-01 DIAGNOSIS — K0500 Acute gingivitis, plaque induced: Secondary | ICD-10-CM | POA: Diagnosis not present

## 2013-10-01 DIAGNOSIS — K0381 Cracked tooth: Secondary | ICD-10-CM | POA: Diagnosis not present

## 2013-10-01 DIAGNOSIS — K029 Dental caries, unspecified: Secondary | ICD-10-CM | POA: Diagnosis not present

## 2013-10-01 DIAGNOSIS — K0520 Aggressive periodontitis, unspecified: Secondary | ICD-10-CM | POA: Diagnosis not present

## 2013-10-01 DIAGNOSIS — Z880 Allergy status to penicillin: Secondary | ICD-10-CM | POA: Diagnosis not present

## 2013-10-01 DIAGNOSIS — K0530 Chronic periodontitis, unspecified: Secondary | ICD-10-CM | POA: Diagnosis not present

## 2013-10-10 DIAGNOSIS — M79609 Pain in unspecified limb: Secondary | ICD-10-CM | POA: Diagnosis not present

## 2013-10-10 DIAGNOSIS — Z79899 Other long term (current) drug therapy: Secondary | ICD-10-CM | POA: Diagnosis not present

## 2013-10-10 DIAGNOSIS — G8918 Other acute postprocedural pain: Secondary | ICD-10-CM | POA: Diagnosis not present

## 2013-10-10 DIAGNOSIS — G894 Chronic pain syndrome: Secondary | ICD-10-CM | POA: Diagnosis not present

## 2013-10-18 HISTORY — PX: OTHER SURGICAL HISTORY: SHX169

## 2013-11-10 ENCOUNTER — Emergency Department: Payer: Self-pay | Admitting: Emergency Medicine

## 2013-11-10 DIAGNOSIS — K029 Dental caries, unspecified: Secondary | ICD-10-CM | POA: Diagnosis not present

## 2013-11-10 DIAGNOSIS — A691 Other Vincent's infections: Secondary | ICD-10-CM | POA: Diagnosis not present

## 2013-11-10 DIAGNOSIS — Z720 Tobacco use: Secondary | ICD-10-CM | POA: Diagnosis not present

## 2013-11-10 DIAGNOSIS — F419 Anxiety disorder, unspecified: Secondary | ICD-10-CM | POA: Diagnosis not present

## 2013-11-10 DIAGNOSIS — R51 Headache: Secondary | ICD-10-CM | POA: Diagnosis not present

## 2013-11-10 DIAGNOSIS — R6884 Jaw pain: Secondary | ICD-10-CM | POA: Diagnosis not present

## 2013-11-10 DIAGNOSIS — K0381 Cracked tooth: Secondary | ICD-10-CM | POA: Diagnosis not present

## 2013-11-14 DIAGNOSIS — M79609 Pain in unspecified limb: Secondary | ICD-10-CM | POA: Diagnosis not present

## 2013-11-14 DIAGNOSIS — G8918 Other acute postprocedural pain: Secondary | ICD-10-CM | POA: Diagnosis not present

## 2013-11-14 DIAGNOSIS — Z79891 Long term (current) use of opiate analgesic: Secondary | ICD-10-CM | POA: Diagnosis not present

## 2013-11-14 DIAGNOSIS — G8929 Other chronic pain: Secondary | ICD-10-CM | POA: Diagnosis not present

## 2013-11-14 DIAGNOSIS — G894 Chronic pain syndrome: Secondary | ICD-10-CM | POA: Diagnosis not present

## 2013-12-04 DIAGNOSIS — M2042 Other hammer toe(s) (acquired), left foot: Secondary | ICD-10-CM | POA: Diagnosis not present

## 2013-12-04 DIAGNOSIS — M1460 Charcot's joint, unspecified site: Secondary | ICD-10-CM | POA: Diagnosis not present

## 2013-12-07 DIAGNOSIS — L03211 Cellulitis of face: Secondary | ICD-10-CM | POA: Diagnosis not present

## 2013-12-07 DIAGNOSIS — B029 Zoster without complications: Secondary | ICD-10-CM | POA: Diagnosis not present

## 2013-12-25 DIAGNOSIS — Z79899 Other long term (current) drug therapy: Secondary | ICD-10-CM | POA: Diagnosis not present

## 2013-12-25 DIAGNOSIS — G5792 Unspecified mononeuropathy of left lower limb: Secondary | ICD-10-CM | POA: Diagnosis not present

## 2013-12-25 DIAGNOSIS — M353 Polymyalgia rheumatica: Secondary | ICD-10-CM | POA: Diagnosis not present

## 2013-12-25 DIAGNOSIS — M2550 Pain in unspecified joint: Secondary | ICD-10-CM | POA: Diagnosis not present

## 2013-12-25 DIAGNOSIS — G5791 Unspecified mononeuropathy of right lower limb: Secondary | ICD-10-CM | POA: Diagnosis not present

## 2013-12-25 DIAGNOSIS — Z79891 Long term (current) use of opiate analgesic: Secondary | ICD-10-CM | POA: Diagnosis not present

## 2013-12-25 DIAGNOSIS — G894 Chronic pain syndrome: Secondary | ICD-10-CM | POA: Diagnosis not present

## 2013-12-25 DIAGNOSIS — M1990 Unspecified osteoarthritis, unspecified site: Secondary | ICD-10-CM | POA: Diagnosis not present

## 2014-01-22 DIAGNOSIS — Z79899 Other long term (current) drug therapy: Secondary | ICD-10-CM | POA: Diagnosis not present

## 2014-05-08 DIAGNOSIS — K219 Gastro-esophageal reflux disease without esophagitis: Secondary | ICD-10-CM | POA: Diagnosis not present

## 2014-05-08 DIAGNOSIS — M14672 Charcot's joint, left ankle and foot: Secondary | ICD-10-CM | POA: Diagnosis not present

## 2014-05-08 DIAGNOSIS — M14671 Charcot's joint, right ankle and foot: Secondary | ICD-10-CM | POA: Diagnosis not present

## 2014-05-08 DIAGNOSIS — N393 Stress incontinence (female) (male): Secondary | ICD-10-CM | POA: Diagnosis not present

## 2014-09-11 DIAGNOSIS — F1120 Opioid dependence, uncomplicated: Secondary | ICD-10-CM | POA: Insufficient documentation

## 2014-10-21 DIAGNOSIS — M545 Low back pain: Secondary | ICD-10-CM | POA: Diagnosis not present

## 2014-10-21 DIAGNOSIS — Z79899 Other long term (current) drug therapy: Secondary | ICD-10-CM | POA: Diagnosis not present

## 2014-10-21 DIAGNOSIS — G894 Chronic pain syndrome: Secondary | ICD-10-CM | POA: Diagnosis not present

## 2014-10-21 DIAGNOSIS — M4697 Unspecified inflammatory spondylopathy, lumbosacral region: Secondary | ICD-10-CM | POA: Diagnosis not present

## 2014-10-21 DIAGNOSIS — M792 Neuralgia and neuritis, unspecified: Secondary | ICD-10-CM | POA: Diagnosis not present

## 2014-11-18 DIAGNOSIS — G894 Chronic pain syndrome: Secondary | ICD-10-CM | POA: Diagnosis not present

## 2014-11-18 DIAGNOSIS — Z79899 Other long term (current) drug therapy: Secondary | ICD-10-CM | POA: Diagnosis not present

## 2014-11-18 DIAGNOSIS — M4697 Unspecified inflammatory spondylopathy, lumbosacral region: Secondary | ICD-10-CM | POA: Diagnosis not present

## 2014-11-18 DIAGNOSIS — M545 Low back pain: Secondary | ICD-10-CM | POA: Diagnosis not present

## 2014-11-18 DIAGNOSIS — M792 Neuralgia and neuritis, unspecified: Secondary | ICD-10-CM | POA: Diagnosis not present

## 2014-12-04 DIAGNOSIS — M14671 Charcot's joint, right ankle and foot: Secondary | ICD-10-CM | POA: Diagnosis not present

## 2014-12-04 DIAGNOSIS — M14672 Charcot's joint, left ankle and foot: Secondary | ICD-10-CM | POA: Diagnosis not present

## 2014-12-04 DIAGNOSIS — G894 Chronic pain syndrome: Secondary | ICD-10-CM | POA: Diagnosis not present

## 2014-12-26 ENCOUNTER — Ambulatory Visit (INDEPENDENT_AMBULATORY_CARE_PROVIDER_SITE_OTHER): Payer: Medicare Other | Admitting: Family Medicine

## 2014-12-26 ENCOUNTER — Encounter: Payer: Self-pay | Admitting: Family Medicine

## 2014-12-26 VITALS — BP 122/74 | HR 80 | Temp 99.6°F | Ht 67.0 in | Wt 156.0 lb

## 2014-12-26 DIAGNOSIS — F418 Other specified anxiety disorders: Secondary | ICD-10-CM

## 2014-12-26 DIAGNOSIS — Z13220 Encounter for screening for lipoid disorders: Secondary | ICD-10-CM

## 2014-12-26 DIAGNOSIS — E669 Obesity, unspecified: Secondary | ICD-10-CM | POA: Diagnosis not present

## 2014-12-26 DIAGNOSIS — G894 Chronic pain syndrome: Secondary | ICD-10-CM

## 2014-12-26 DIAGNOSIS — E611 Iron deficiency: Secondary | ICD-10-CM | POA: Insufficient documentation

## 2014-12-26 DIAGNOSIS — Z130 Encounter for screening for diseases of the blood and blood-forming organs and certain disorders involving the immune mechanism: Secondary | ICD-10-CM | POA: Diagnosis not present

## 2014-12-26 DIAGNOSIS — G600 Hereditary motor and sensory neuropathy: Secondary | ICD-10-CM

## 2014-12-26 DIAGNOSIS — Z862 Personal history of diseases of the blood and blood-forming organs and certain disorders involving the immune mechanism: Secondary | ICD-10-CM

## 2014-12-26 DIAGNOSIS — F419 Anxiety disorder, unspecified: Secondary | ICD-10-CM | POA: Insufficient documentation

## 2014-12-26 DIAGNOSIS — F329 Major depressive disorder, single episode, unspecified: Secondary | ICD-10-CM | POA: Diagnosis not present

## 2014-12-26 DIAGNOSIS — F32A Depression, unspecified: Secondary | ICD-10-CM

## 2014-12-26 DIAGNOSIS — N393 Stress incontinence (female) (male): Secondary | ICD-10-CM

## 2014-12-26 NOTE — Assessment & Plan Note (Addendum)
Reports long history of anxiety and depression. Depression is worsened recently. Discussed that she should not abruptly  discontinue Zoloft. she would like to try different medication. We will start her on 50 mg of Zoloft today as the start of a taper and continue this for 7 days and then have her take 25 mg of Zoloft per day for 7 days. She will then follow-up in the office. We will request records from her prior PCP prior to refilling her Ativan. She has enough to last her through the next week or so. She will contact [facility] to see if she knew individual therapy there, if she is unable to do this again give her the name of a local therapist. PHQ 9 score of 16 with very difficult. She has no SI or HI. She is given return precautions.

## 2014-12-26 NOTE — Assessment & Plan Note (Signed)
Symptoms consistent with stress incontinence. No other urinary symptoms. Benign abdominal exam. I did discuss doing a pelvic exam to look for other causes, the patient declined this today. She will consider seeing [name] in follow-up for that issue or going to her gynecologist for this. She is advised on Kegel exercises. Given return precautions.

## 2014-12-26 NOTE — Assessment & Plan Note (Signed)
Patient with history of anemia. Notes her hemoglobin has not been checked in a year. She is asymptomatic. She does note craving of ice that could indicate iron deficiency. We will check a CBC and iron panel.

## 2014-12-26 NOTE — Assessment & Plan Note (Addendum)
Patient with chronic pain in her feet related Charcot-Marie-Tooth. She had previously been on chronic narcotics though has been unable to get these filled recently and has been off of them for about a month. She's been taking Aleve in the interim 2 tablets over-the-counter twice a day. This is mildly beneficial. She will continue this. She has an appointment with pain management in January, though she would like to see if she can get in somewhere sooner. We will place referral to a pain clinic to see if she can get scheduled sooner than that. She will continue to follow with orthopedics for management of this issue. I discussed that she needed to monitor her feet closely For signs of wounds or ulcers. Suspect intermittent numbness in her feet and hands is related to this issue. Suspect numbness in her knee was positional in nature as it improved quickly with change in position and has not recurred. She will continue to monitor this. If she has persistent numbness or develops any new symptoms she will let us know. She's given return precautions.

## 2014-12-26 NOTE — Progress Notes (Signed)
Patient ID: [name], female   DOB: August 26, 1975, 39 y.o.   MRN: [id number]  [name], MD Phone: [phone]  [name] is a 39 y.o. female who presents today for a new patient visit.  Depression/anxiety: Patient notes she has intermittently dealt with depression and anxiety for a number of years. She notes this has worsened recently as her sister passed away from a pneumonia and then her best friend passed away as well. She notes this manifests as feelings of depression, decreased energy, anxiety, and difficulty focusing. She had been on Zoloft 100 mg daily, though states this was not helping and she stopped this 4 days ago. She also notes she had nausea related to the Zoloft. She's not had any nausea since stopping this. She denies SI and HI. She's been on Celexa previously and was unable to obtain an orgasm on this. She notes Prozac has helped in the past. She is taking Ativan 0.5 mg twice daily for her anxiety. She denies symptoms of withdrawal from Zoloft.  Chronic pain patient notes chronic pain in her feet. She has Charcot-Marie-Tooth syndrome and muscular dystrophy. 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 She has a chronic dislocation of the left midfoot. She notes intermittent tingling and numbness in her bilateral feet with this. She also has this in her bilateral hands. She does note a single episode of left knee isolated numbness that was positional and improved with change in position and was related to how she was sitting in the chair. She notes she is followed by [facility] orthopedics for her feet. Dr. Clemmie Krill (her former PCP) has been filling her chronic narcotics. She was put on Suboxone at [facility]. She notes this did not help her pain. She has not been taking her chronic narcotics in over a month.  Stress incontinence: Patient notes 7-8 year history of "weak bladder." She notes she leaks a small amount of urine occasionally when she coughs or  laughs or sneezes. She does not have any urgency, frequency, or dysuria. She notes she gave birth to a large child in her early 21s. She denies any prolapse. She started doing Kegel exercises 10 days ago, though has not noted any benefit yet. She notes she was prescribed oxybutynin previously for this with no benefit.  History of anemia: Patient reports a history of anemia in the past. She denies chest pain, shortness of breath, palpitations, and lightheadedness. She does note she craves ice. States she has not had blood work done in some time. She's not bleeding from anywhere.  Active Ambulatory Problems    Diagnosis Date Noted  . Anxiety and depression 12/26/2014  . Charcot-Marie-Tooth disease 12/26/2014  . Stress incontinence 12/26/2014  . History of anemia 12/26/2014   Resolved Ambulatory Problems    Diagnosis Date Noted  . No Resolved Ambulatory Problems   Past Medical History  Diagnosis Date  . Asthma   . Chickenpox   . Depression   . Anxiety   . Allergic rhinitis   . Lisfranc dislocation   . Osteomyelitis of foot ([place])   . Muscular dystrophy ([place])     Family History  Problem Relation Age of Onset  . Hyperlipidemia Father   . Hypertension Father   . Sudden death Sister     Pneumonia  . Diabetes Paternal Grandmother   . Muscular dystrophy      Mother, grandparents, other relatives on maternal side    Social History   Social History  . Marital  Status: Married    Spouse Name: N/A  . Number of Children: N/A  . Years of Education: N/A   Occupational History  . Not on file.   Social History Main Topics  . Smoking status: Current Every Day Smoker  . Smokeless tobacco: Not on file  . Alcohol Use: 0.0 oz/week    0 Standard drinks or equivalent per week     Comment: infrequent  . Drug Use: No  . Sexual Activity: Not on file   Other Topics Concern  . Not on file   Social History Narrative    ROS   General:  Negative for unexplained weight loss,  fever Skin: Negative for new or changing mole, sore that won't heal HEENT: Negative for trouble hearing, trouble seeing, ringing in ears, mouth sores, hoarseness, change in voice, dysphagia. CV:  Negative for chest pain, dyspnea, edema, palpitations Resp: Negative for cough, dyspnea, hemoptysis GI: Positive for nausea, Negative for vomiting, diarrhea, constipation, abdominal pain, melena, hematochezia. GU: Positive for stress incontinence, Negative for dysuria, urinary hesitance, hematuria, vaginal or penile discharge, polyuria, sexual difficulty, lumps in testicle or breasts MSK: Positive for joint aches, Negative for muscle cramps or aches Neuro: Negative for headaches, weakness, numbness, dizziness, passing out/fainting Psych: Positive for depression, anxiety, negative for memory problems  Objective  Physical Exam Filed Vitals:   12/26/14 1035  BP: 122/74  Pulse: 80  Temp: 99.6 F (37.6 C)    BP Readings from Last 3 Encounters:  12/26/14 122/74   Wt Readings from Last 3 Encounters:  12/26/14 156 lb (70.761 kg)    Physical Exam  Constitutional: She is well-developed, well-nourished, and in no distress.  HENT:  Head: Normocephalic and atraumatic.  Right Ear: External ear normal.  Left Ear: External ear normal.  Mouth/Throat: Oropharynx is clear and moist. No oropharyngeal exudate.  Eyes: Conjunctivae are normal. Pupils are equal, round, and reactive to light.  Neck: Neck supple.  Cardiovascular: Normal rate, regular rhythm and normal heart sounds.  Exam reveals no gallop and no friction rub.   No murmur heard. Pulmonary/Chest: Effort normal and breath sounds normal. No respiratory distress. She has no wheezes. She has no rales.  Abdominal: Soft. Bowel sounds are normal. She exhibits no distension. There is no tenderness. There is no rebound and no guarding.  Genitourinary:  Declined pelvic exam  Musculoskeletal: She exhibits no edema.  Bilateral feet with evidence of  great toe amputation, other toes are intact, there are no wounds or sores noted, there is palpable dislocation of the left midfoot, sensation is intact in bilateral feet, 2+ DP pulses bilaterally  Lymphadenopathy:    She has no cervical adenopathy.  Neurological: She is alert.  CN 2-12 intact, 5/5 strength in bilateral biceps, triceps, grip, quads, hamstrings, plantar and dorsiflexion, sensation to light touch intact in bilateral UE and LE, normal gait, 2+ patellar reflexes  Skin: Skin is warm and dry. She is not diaphoretic.  Psychiatric:  Mood depressed, affect normal     Assessment/Plan:   Anxiety and depression Reports long history of anxiety and depression. Depression is worsened recently. Discussed that she should not abruptly  discontinue Zoloft. she would like to try different medication. We will start her on 50 mg of Zoloft today as the start of a taper and continue this for 7 days and then have her take 25 mg of Zoloft per day for 7 days. She will then follow-up in the office. We will request records from her prior PCP prior  to refilling her Ativan. She has enough to last her through the next week or so. She will contact [facility] to see if she knew individual therapy there, if she is unable to do this again give her the name of a local therapist. PHQ 9 score of 16 with very difficult. She has no SI or HI. She is given return precautions.  Charcot-Marie-Tooth disease Patient with chronic pain in her feet related Charcot-Marie-Tooth. She had previously been on chronic narcotics though has been unable to get these filled recently and has been off of them for about a month. She's been taking Aleve in the interim 2 tablets over-the-counter twice a day. This is mildly beneficial. She will continue this. She has an appointment with pain management in January, though she would like to see if she can get in somewhere sooner. We will place referral to a pain clinic to see if she can  get scheduled sooner than that. She will continue to follow with orthopedics for management of this issue. I discussed that she needed to monitor her feet closely For signs of wounds or ulcers. Suspect intermittent numbness in her feet and hands is related to this issue. Suspect numbness in her knee was positional in nature as it improved quickly with change in position and has not recurred. She will continue to monitor this. If she has persistent numbness or develops any new symptoms she will let us know. She's given return precautions.  Stress incontinence Symptoms consistent with stress incontinence. No other urinary symptoms. Benign abdominal exam. I did discuss doing a pelvic exam to look for other causes, the patient declined this today. She will consider seeing [name] in follow-up for that issue or going to her gynecologist for this. She is advised on Kegel exercises. Given return precautions.  History of anemia Patient with history of anemia. Notes her hemoglobin has not been checked in a year. She is asymptomatic. She does note craving of ice that could indicate iron deficiency. We will check a CBC and iron panel.    Orders Placed This Encounter  Procedures  . Comp Met (CMET)    Standing Status: Future     Number of Occurrences:      Standing Expiration Date: 12/26/2015  . Lipid Profile    Standing Status: Future     Number of Occurrences:      Standing Expiration Date: 12/26/2015  . CBC    Standing Status: Future     Number of Occurrences:      Standing Expiration Date: 12/26/2015  . TSH    Standing Status: Future     Number of Occurrences:      Standing Expiration Date: 12/26/2015  . Ferritin    Standing Status: Future     Number of Occurrences:      Standing Expiration Date: 12/26/2015  . Iron and TIBC    Standing Status: Future     Number of Occurrences:      Standing Expiration Date: 12/26/2015  . Ambulatory referral to Pain Clinic    Referral Priority:  Routine    Referral  Type:  Consultation    Referral Reason:  Specialty Services Required    Requested Specialty:  Pain Medicine    Number of Visits Requested:  1    [name]

## 2014-12-26 NOTE — Progress Notes (Signed)
Pre visit review using our clinic review tool, if applicable. No additional management support is needed unless otherwise documented below in the visit note. 

## 2014-12-26 NOTE — Patient Instructions (Signed)
Nice to meet you. We will decrease the dose of her Zoloft 50 mg daily for 7 days and then 25 mg daily for 7 days to help minimize risk of withdrawal. If you develop any of the following symptoms please seek medical attention:nausea, vomiting, diarrhea, headaches, lightheadedness, dizziness, diminished appetite, sweating, chills, tremors, paresthesias, fatigue, somnolence, and sleep disturbances. If your depression gets worse or anxiety gets worse or become agitated please also seek medical attention. Please consider setting up an appointment with the gynecologist or myself for pelvic exam. Please monitor your pain. If you develop persistent numbness, or weakness, or any new or change in symptoms please seek medical attention. If you develop loss of bowel or bladder function, back pain, fever, numbness between her legs, or any new or changing symptoms please seek medical attention.

## 2014-12-27 ENCOUNTER — Encounter: Payer: Self-pay | Admitting: Family Medicine

## 2015-01-01 ENCOUNTER — Other Ambulatory Visit: Payer: Self-pay

## 2015-01-01 NOTE — Telephone Encounter (Signed)
Please advise, as you have not prescribed this medication to this patient.  thanks

## 2015-01-02 NOTE — Telephone Encounter (Signed)
Spoke with Dr. Eula Flax office, they received your request today for records.  Per there office, Patient was on Lorazepam 0.5mg  take tablet Q4hours  #20 for detoxic on Dec 3rd, 2016.  thanks

## 2015-01-02 NOTE — Telephone Encounter (Signed)
Can you confirm the dosing with the patients prior physicians office as she told me she was taking the medication twice daily. I advised the patient i would need to see records prior to refilling this. If we can get confirmation from her previous PCP or the pharmacy I will refill it. Thanks.

## 2015-01-03 MED ORDER — LORAZEPAM 0.5 MG PO TABS: 0.5000 mg | ORAL_TABLET | Freq: Two times a day (BID) | ORAL | Status: DC | PRN

## 2015-01-03 NOTE — Telephone Encounter (Signed)
Patient called the office and wanted a update on her prescription and referral status.  Please advise?

## 2015-01-03 NOTE — Telephone Encounter (Signed)
Spoke with the patient and explained.  She was fine with the short term prescription.

## 2015-01-03 NOTE — Telephone Encounter (Signed)
I will refill for 2 weeks. We'll need to discuss this further when the patient comes in the office.

## 2015-01-08 ENCOUNTER — Ambulatory Visit (INDEPENDENT_AMBULATORY_CARE_PROVIDER_SITE_OTHER): Payer: Medicare Other | Admitting: Family Medicine

## 2015-01-08 ENCOUNTER — Encounter: Payer: Self-pay | Admitting: Family Medicine

## 2015-01-08 VITALS — BP 112/66 | HR 86 | Temp 99.3°F | Ht 67.0 in | Wt 154.8 lb

## 2015-01-08 DIAGNOSIS — F32A Depression, unspecified: Secondary | ICD-10-CM

## 2015-01-08 DIAGNOSIS — K0889 Other specified disorders of teeth and supporting structures: Secondary | ICD-10-CM | POA: Insufficient documentation

## 2015-01-08 DIAGNOSIS — F418 Other specified anxiety disorders: Secondary | ICD-10-CM | POA: Diagnosis not present

## 2015-01-08 DIAGNOSIS — F329 Major depressive disorder, single episode, unspecified: Secondary | ICD-10-CM

## 2015-01-08 DIAGNOSIS — G894 Chronic pain syndrome: Secondary | ICD-10-CM | POA: Diagnosis not present

## 2015-01-08 DIAGNOSIS — Z862 Personal history of diseases of the blood and blood-forming organs and certain disorders involving the immune mechanism: Secondary | ICD-10-CM | POA: Diagnosis not present

## 2015-01-08 DIAGNOSIS — Z13220 Encounter for screening for lipoid disorders: Secondary | ICD-10-CM

## 2015-01-08 DIAGNOSIS — F419 Anxiety disorder, unspecified: Secondary | ICD-10-CM

## 2015-01-08 LAB — LIPID PANEL
Cholesterol: 222 mg/dL — ABNORMAL HIGH (ref 0–200)
HDL: 45.7 mg/dL (ref >39.00)
LDL Cholesterol: 141 mg/dL — ABNORMAL HIGH (ref 0–99)
NonHDL: 175.83
Total CHOL/HDL Ratio: 5
Triglycerides: 172 mg/dL — ABNORMAL HIGH (ref 0.0–149.0)
VLDL: 34.4 mg/dL (ref 0.0–40.0)

## 2015-01-08 LAB — CBC
HCT: 38.8 % (ref 36.0–46.0)
Hemoglobin: 12.4 g/dL (ref 12.0–15.0)
MCHC: 31.8 g/dL (ref 30.0–36.0)
MCV: 79.2 fl (ref 78.0–100.0)
Platelets: 277 10*3/uL (ref 150.0–400.0)
RBC: 4.91 Mil/uL (ref 3.87–5.11)
RDW: 23.3 % — ABNORMAL HIGH (ref 11.5–15.5)
WBC: 6.1 10*3/uL (ref 4.0–10.5)

## 2015-01-08 LAB — COMPREHENSIVE METABOLIC PANEL
ALT: 11 U/L (ref 0–35)
AST: 18 U/L (ref 0–37)
Albumin: 3.8 g/dL (ref 3.5–5.2)
Alkaline Phosphatase: 43 U/L (ref 39–117)
BUN: 6 mg/dL (ref 6–23)
CO2: 26 mEq/L (ref 19–32)
Calcium: 9.1 mg/dL (ref 8.4–10.5)
Chloride: 104 mEq/L (ref 96–112)
Creatinine, Ser: 0.45 mg/dL (ref 0.40–1.20)
GFR: 162.54 mL/min (ref >60.00)
Glucose, Bld: 77 mg/dL (ref 70–99)
Potassium: 4.7 mEq/L (ref 3.5–5.1)
Sodium: 138 mEq/L (ref 135–145)
Total Bilirubin: 0.3 mg/dL (ref 0.2–1.2)
Total Protein: 6.7 g/dL (ref 6.0–8.3)

## 2015-01-08 LAB — TSH: TSH: 1.11 u[IU]/mL (ref 0.35–4.50)

## 2015-01-08 LAB — FERRITIN: Ferritin: 3.9 ng/mL — ABNORMAL LOW (ref 10.0–291.0)

## 2015-01-08 MED ORDER — FLUOXETINE HCL 20 MG PO TABS: 20.0000 mg | ORAL_TABLET | Freq: Every day | ORAL | Status: DC

## 2015-01-08 MED ORDER — LORAZEPAM 0.5 MG PO TABS: 0.5000 mg | ORAL_TABLET | Freq: Two times a day (BID) | ORAL | Status: DC | PRN

## 2015-01-08 NOTE — Progress Notes (Signed)
Pre visit review using our clinic review tool, if applicable. No additional management support is needed unless otherwise documented below in the visit note. 

## 2015-01-08 NOTE — Patient Instructions (Signed)
Nice to see you. We will start you on prozac for depression and anxiety. It would be beneficial to see a therapist for this issue. Please contact the therapist that was provided to see about an appointment.  Please contact a dentist about your tooth.  If you develop thoughts of harming yourself or others, or develop redness or drainage around your teeth or swelling in your jaw please seek medical attention.

## 2015-01-08 NOTE — Assessment & Plan Note (Signed)
Patient has incredibly poor dentition. It appears that she has cracked part of her tooth off surrounding a filling in one of her right lower molars. She is advised that she needed to see a dentist for this. There is no signs of infection. She can take Aleve 440 mg twice a day as needed for pain. She is given contact information for the [facility] dental school and advised to contact local dentists to see if she can get seen. Given return precautions.

## 2015-01-08 NOTE — Assessment & Plan Note (Signed)
PHQ 9 score of 9 very difficult. GAD 7 score of 18, somewhat difficult. MDQ negative given answer to #3 no problem. Patient has tapered off Zoloft. We will change her to Prozac at this time. We will refill her Ativan for a two-week course. Discussed this was to take as needed. Patient states her goal is to not be on this long-term and hopefully have Prozac be the treatment answer. She is given the name of a local therapist contact as well. She is also advised to ask her group therapy instruct her if they did individual therapy. She is given return precautions.

## 2015-01-08 NOTE — Progress Notes (Signed)
Patient ID: [name], female   DOB: 1975/03/09, 39 y.o.   MRN: [id number]  [name], MD Phone: [phone]  [name] is a 39 y.o. female who presents today for follow-up.  Depression/anxiety: Patient notes these issues are stable. She is tapered off Zoloft. She noted no adverse effects with tapering off. She notes depression is stable. She easily becomes overwhelmed. She notes she can't stay focused. She denies SI and HI. She states she has been on Celexa in the past though could not tolerate this due to inability to have an orgasm. She's been on Prozac previously though is unsure why they took her off of this and felt as though this was a beneficial medication. Notes her anxiety has been pretty high lately and she is unsure why. No life changes. Note she has not been on Ativan since 3-4 days after last visit. She did not have any withdrawal symptoms. She notes the Ativan helped as needed. She is typically taking once a day  Tooth pain: Patient notes 2-3 days ago she ate some roasted walnuts and cracked a tooth. She notes she has poor dentition a baseline. She has had pain in her right lower teeth following this. No swelling, erythema, or drainage. She has taken 3-4 Aleve a day for this. Unable to get in to see a dentist.  PMH: Current smoker   ROS the history of present illness  Objective  Physical Exam Filed Vitals:   01/08/15 1104  BP: 112/66  Pulse: 86  Temp: 99.3 F (37.4 C)    Physical Exam  Constitutional: She is well-developed, well-nourished, and in no distress.  HENT:  Head: Normocephalic and atraumatic.  Mouth/Throat: Oropharynx is clear and moist. No oropharyngeal exudate.  Poor dentition, right lower molar second from posterior with filling down the middle and no tooth apparent on the lateral aspect, no pain on palpation with a tongue depressor, no drainage or swelling of the gums, no tenderness of the jaw  Neck: Neck supple.    Cardiovascular: Normal rate, regular rhythm and normal heart sounds.  Exam reveals no gallop and no friction rub.   No murmur heard. Pulmonary/Chest: Effort normal and breath sounds normal. No respiratory distress. She has no wheezes. She has no rales.  Lymphadenopathy:    She has no cervical adenopathy.  Neurological: She is alert. Gait normal.  Skin: She is not diaphoretic.  Psychiatric: Affect normal.  Mood anxious     Assessment/Plan: Please see individual problem list.  Anxiety and depression PHQ 9 score of 9 very difficult. GAD 7 score of 18, somewhat difficult. MDQ negative given answer to #3 no problem. Patient has tapered off Zoloft. We will change her to Prozac at this time. We will refill her Ativan for a two-week course. Discussed this was to take as needed. Patient states her goal is to not be on this long-term and hopefully have Prozac be the treatment answer. She is given the name of a local therapist contact as well. She is also advised to ask her group therapy instruct her if they did individual therapy. She is given return precautions.  Tooth pain Patient has incredibly poor dentition. It appears that she has cracked part of her tooth off surrounding a filling in one of her right lower molars. She is advised that she needed to see a dentist for this. There is no signs of infection. She can take Aleve 440 mg twice a day as needed for pain. She is given contact  information for the [facility] dental school and advised to contact local dentists to see if she can get seen. Given return precautions.    Meds ordered this encounter  Medications  . LORazepam (ATIVAN) 0.5 MG tablet    Sig: Take 1 tablet (0.5 mg total) by mouth 2 (two) times daily as needed for anxiety.    Dispense:  30 tablet    Refill:  0  . FLUoxetine (PROZAC) 20 MG tablet    Sig: Take 1 tablet (20 mg total) by mouth daily.    Dispense:  30 tablet    Refill:  3   Dragon voice recognition software was used during  the dictation process of this note. If any phrases or words seem inappropriate it is likely secondary to the translation process being inefficient.  [name]

## 2015-01-09 ENCOUNTER — Other Ambulatory Visit: Payer: Self-pay | Admitting: Family Medicine

## 2015-01-09 LAB — IRON AND TIBC
%SAT: 4 % — ABNORMAL LOW (ref 11–50)
Iron: 17 ug/dL — ABNORMAL LOW (ref 40–190)
TIBC: 442 ug/dL (ref 250–450)
UIBC: 425 ug/dL — ABNORMAL HIGH (ref 125–400)

## 2015-01-09 MED ORDER — FERROUS SULFATE 325 (65 FE) MG PO TABS: 325.0000 mg | ORAL_TABLET | Freq: Two times a day (BID) | ORAL | Status: DC

## 2015-01-14 ENCOUNTER — Encounter: Payer: Self-pay | Admitting: Family Medicine

## 2015-01-21 ENCOUNTER — Encounter: Payer: Self-pay | Admitting: Family Medicine

## 2015-01-22 ENCOUNTER — Other Ambulatory Visit: Payer: Self-pay | Admitting: Family Medicine

## 2015-01-24 ENCOUNTER — Telehealth: Payer: Self-pay | Admitting: Family Medicine

## 2015-01-24 ENCOUNTER — Other Ambulatory Visit: Payer: Self-pay | Admitting: Family Medicine

## 2015-01-24 DIAGNOSIS — G600 Hereditary motor and sensory neuropathy: Secondary | ICD-10-CM

## 2015-01-24 NOTE — Telephone Encounter (Signed)
Thanks, Melissa please advise?

## 2015-01-24 NOTE — Telephone Encounter (Signed)
Please advise as to refill. 

## 2015-01-24 NOTE — Telephone Encounter (Signed)
Pt called about the referral that she was referred to declined her as a pt. Pt would like to go to [facility] for pain medicine. Fax [phone] and phone number for them is [phone]. She was told that she would not be able to get in for 4-6 weeks. Call pt @ [phone]. Thank You!

## 2015-01-24 NOTE — Telephone Encounter (Signed)
Referral sent to [facility]. Sent to their work que [employer]) rehabilitation and pain

## 2015-01-24 NOTE — Telephone Encounter (Signed)
[facility] be referred to [facility]. I will place the referral.

## 2015-01-24 NOTE — Telephone Encounter (Signed)
Please advise 

## 2015-01-24 NOTE — Telephone Encounter (Signed)
We'll refill and fax.

## 2015-01-29 ENCOUNTER — Encounter: Payer: Self-pay | Admitting: Family Medicine

## 2015-01-30 ENCOUNTER — Other Ambulatory Visit: Payer: Self-pay | Admitting: Family Medicine

## 2015-01-30 DIAGNOSIS — G894 Chronic pain syndrome: Secondary | ICD-10-CM | POA: Diagnosis not present

## 2015-01-30 DIAGNOSIS — M545 Low back pain: Secondary | ICD-10-CM | POA: Diagnosis not present

## 2015-01-30 DIAGNOSIS — F331 Major depressive disorder, recurrent, moderate: Secondary | ICD-10-CM | POA: Diagnosis not present

## 2015-01-30 DIAGNOSIS — Z79891 Long term (current) use of opiate analgesic: Secondary | ICD-10-CM | POA: Diagnosis not present

## 2015-01-30 MED ORDER — LIDOCAINE HCL 2 % EX GEL: 1.0000 "application " | Freq: Two times a day (BID) | CUTANEOUS | Status: DC

## 2015-02-03 ENCOUNTER — Encounter: Payer: Self-pay | Admitting: Family Medicine

## 2015-02-07 ENCOUNTER — Other Ambulatory Visit: Payer: Self-pay | Admitting: Family Medicine

## 2015-02-07 NOTE — Telephone Encounter (Signed)
Patient needs to be evaluated prior to being prescribed this medication.

## 2015-02-07 NOTE — Telephone Encounter (Signed)
Patient will come for appointment on 02/17/15.

## 2015-02-07 NOTE — Telephone Encounter (Signed)
Patient is requesting a prescription for Phenergan. Patient stated that Dr. Clemmie Krill use to prescribe this for her. She has been feeling nauseated for a couple months per the patient. Patient already has appointment scheduled for 02/17/15. Advised patient that due to not having this on her medication list or problem list she may need to be seen first. Please advise

## 2015-02-10 ENCOUNTER — Ambulatory Visit: Admitting: Family Medicine

## 2015-02-13 DIAGNOSIS — M791 Myalgia: Secondary | ICD-10-CM | POA: Diagnosis not present

## 2015-02-13 DIAGNOSIS — G894 Chronic pain syndrome: Secondary | ICD-10-CM | POA: Diagnosis not present

## 2015-02-13 DIAGNOSIS — F331 Major depressive disorder, recurrent, moderate: Secondary | ICD-10-CM | POA: Diagnosis not present

## 2015-02-17 ENCOUNTER — Ambulatory Visit: Admitting: Family Medicine

## 2015-02-18 ENCOUNTER — Other Ambulatory Visit: Payer: Self-pay | Admitting: Family Medicine

## 2015-02-18 NOTE — Telephone Encounter (Signed)
Refill provided. Please inform the patient of this and that she will need to have office visits in the future for this. If she requests this again without an office visit I will not fill it.

## 2015-02-18 NOTE — Telephone Encounter (Signed)
Please advise on refill. Patient has rescheduled and canceled last 2 appointments.

## 2015-02-18 NOTE — Telephone Encounter (Signed)
Scheduled patient for follow up appointment on 02/26/15. Advised that we would not be able to refill medication without follow up appointment being made.

## 2015-02-26 ENCOUNTER — Ambulatory Visit (INDEPENDENT_AMBULATORY_CARE_PROVIDER_SITE_OTHER): Payer: Medicare Other | Admitting: Family Medicine

## 2015-02-26 ENCOUNTER — Encounter: Payer: Self-pay | Admitting: Family Medicine

## 2015-02-26 VITALS — BP 124/78 | HR 80 | Temp 99.1°F | Ht 67.0 in | Wt 152.0 lb

## 2015-02-26 DIAGNOSIS — J309 Allergic rhinitis, unspecified: Secondary | ICD-10-CM | POA: Diagnosis not present

## 2015-02-26 DIAGNOSIS — F32A Depression, unspecified: Secondary | ICD-10-CM

## 2015-02-26 DIAGNOSIS — F329 Major depressive disorder, single episode, unspecified: Secondary | ICD-10-CM

## 2015-02-26 DIAGNOSIS — E611 Iron deficiency: Secondary | ICD-10-CM

## 2015-02-26 DIAGNOSIS — N926 Irregular menstruation, unspecified: Secondary | ICD-10-CM | POA: Diagnosis not present

## 2015-02-26 DIAGNOSIS — F418 Other specified anxiety disorders: Secondary | ICD-10-CM

## 2015-02-26 DIAGNOSIS — F419 Anxiety disorder, unspecified: Secondary | ICD-10-CM

## 2015-02-26 IMAGING — CR DG FOOT COMPLETE 3+V*L*
3 series · 3 of 3 positions shown · non-contrast
Comparison: Plain films left foot 11/14/2009.

CLINICAL DATA: Left foot pain.

EXAM:
LEFT FOOT - COMPLETE 3+ VIEW

[foot ap]
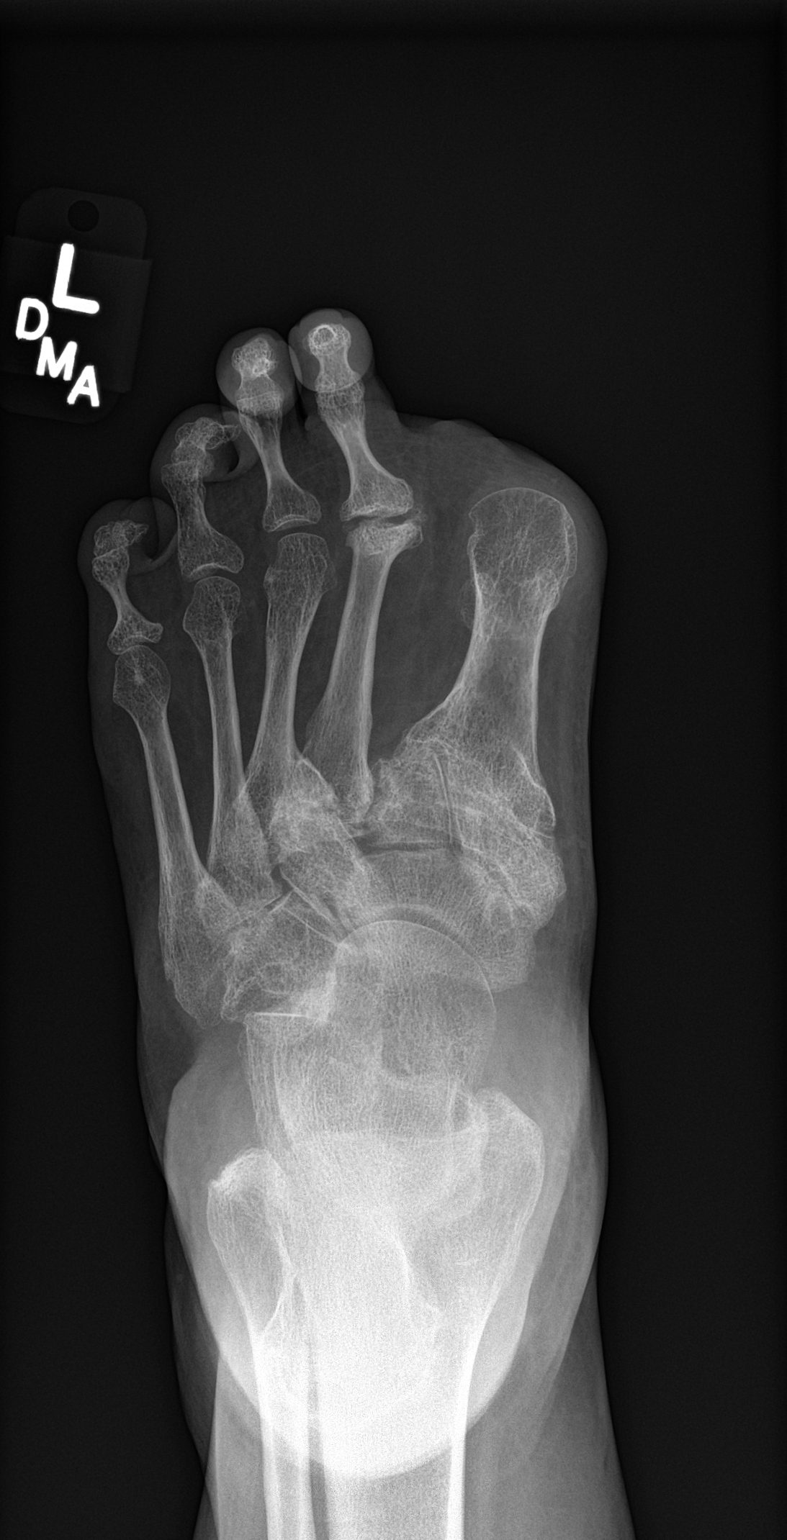

[foot obl]
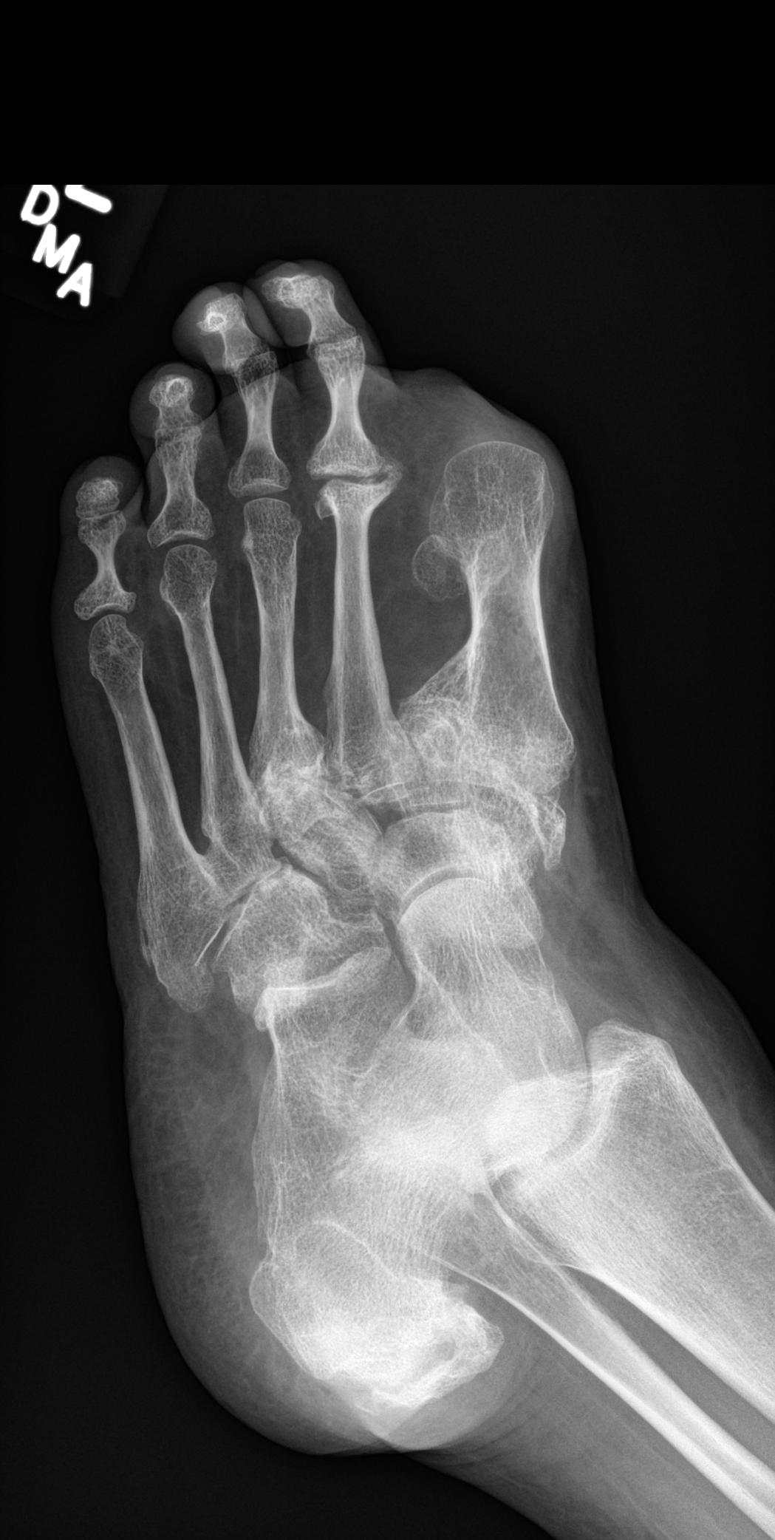

[foot lat]
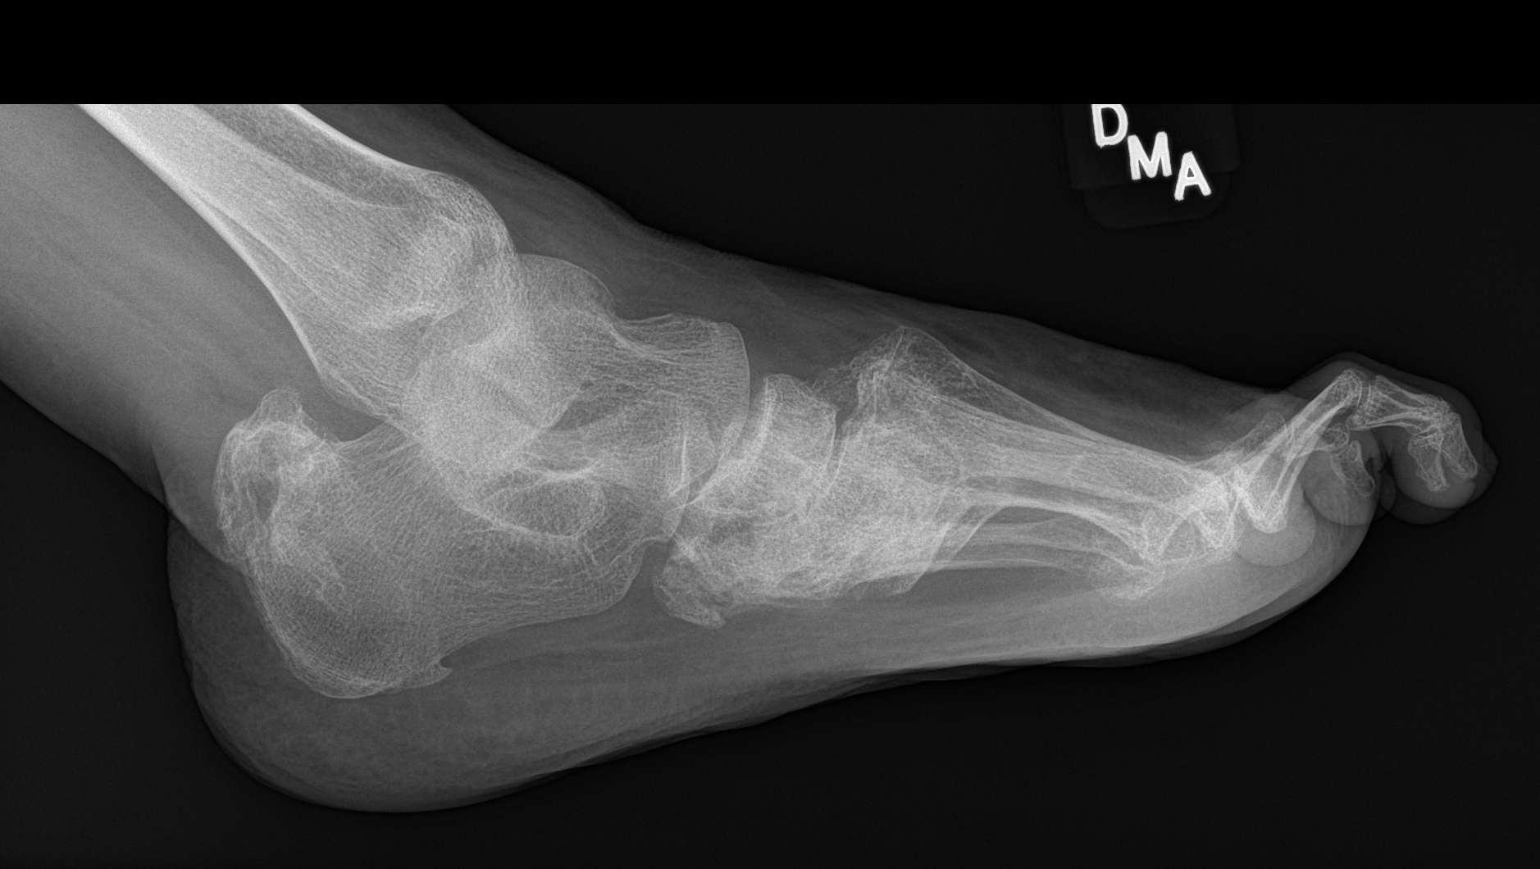

[3 of 3 positions shown; findings below may reference images not displayed]

FINDINGS: Again seen is postoperative change of amputation of the great toe.
No acute bony or joint abnormality is identified. There is
progressive flattening of the head of the second metatarsal
compatible with avascular necrosis. The bones of the midfoot
demonstrate a disorganized appearance with extensive sclerosis. The
patient has a very large dorsal calcaneal spur which is markedly
increased in size since the prior examination.
IMPRESSION: Negative for fracture or other acute abnormality.

Marked worsened Charcot change left midfoot.

Findings compatible with avascular necrosis of the second metatarsal
head with progressive flattening since the prior study.

Very large dorsal calcaneal spur has increased in size since the
prior study and could be due to prior trauma and/or chronic Achilles
tendinosis.

## 2015-02-26 MED ORDER — FLUOXETINE HCL 20 MG PO TABS: 40.0000 mg | ORAL_TABLET | Freq: Every day | ORAL | Status: DC

## 2015-02-26 MED ORDER — FLUTICASONE PROPIONATE 50 MCG/ACT NA SUSP: 2.0000 | Freq: Every day | NASAL | Status: DC

## 2015-02-26 NOTE — Assessment & Plan Note (Addendum)
Irregular periods over the last year. She is otherwise asymptomatic. She declined pelvic exam today and opted for her gynecologist to complete this. With hot flashes and period changes this could be consistent with perimenopause. Will have patient follow-up with her gynecologist for pelvic exam and further evaluation of this issue. Patient states she will contact them for an appointment. Given return precautions.

## 2015-02-26 NOTE — Assessment & Plan Note (Signed)
Anxiety mildly improved, depression mildly worsened. No SI or HI. We'll increase Prozac to 40 mg daily. Patient will continue to follow with psychiatry. She is advised to follow-up with the therapist. She was previously given information on therapist. Given return precautions.

## 2015-02-26 NOTE — Assessment & Plan Note (Addendum)
Increase Flonase to 2 sprays each nostril daily.

## 2015-02-26 NOTE — Patient Instructions (Signed)
Nice to see you. We will increase her Prozac to 40 mg daily. Please follow up with her gynecologist. Please take your iron supplement at night. You can try Flonase 2 sprays each nostril for your allergies. If you develop thoughts of harming herself or others, vomiting, diarrhea, abdominal pain, excessive vaginal bleeding, lightheadedness, increased heart rate, fatigue, trouble breathing, or any new or changing symptoms please seek medical attention.

## 2015-02-26 NOTE — Assessment & Plan Note (Signed)
Patient found be iron deficient with no anemia on last blood work. She's had some nausea with her iron tablet. No other symptoms with this. She wants to try taking it at night prior to going to bed to see if this helps. She will take with food. She'll continue to monitor. We will check a CBC and iron at her next visit.

## 2015-02-26 NOTE — Progress Notes (Signed)
Pre visit review using our clinic review tool, if applicable. No additional management support is needed unless otherwise documented below in the visit note. 

## 2015-02-26 NOTE — Progress Notes (Signed)
Patient ID: [name], female   DOB: 1975-06-03, 40 y.o.   MRN: [id number]  [name], MD Phone: [phone]  [name] is a 40 y.o. female who presents today for follow-up.  Anxiety/depression: Patient notes she has some days that are worse than others. She notes she just feels down. She's not increased the Prozac yet due to not having the supply to do this. She stopped taking Ativan several days ago. She had been taking at most 1 a day and had not been taking it every day. She felt it did not help. She denies SI and HI. She is followed by psychiatry. She is going to contact a therapist.  Iron deficiency: Patient previously found to be iron deficient. She was not anemic. She has been taking half a tablet of iron daily as it makes her nauseous. She has been taking it with food though this does not help. No vomiting, diarrhea, or abdominal pain. She notes Phenergan help with the nausea.  Patient also notes that she feels as though she is going into menopause. She notes her periods are not as regular as previously. She notes over the last 4 months she has had one period per month though they've been lasting 9 days. prior to that she could go up to 8 weeks between her periods. She notes hot flashes with this. The hotflashes can occur day and night. She notes some mood swings. She notes she started her current period 2 days ago. She denies excessive bleeding with this. Notes it is just prolonged. No palpitations, lightheadedness, or fatigue. No history of breast cancer, CAD, VTE, or liver disease. She notes no TB exposure. She notes no travel outside the country.  Allergic rhinitis: Patient also notes long history of allergies. She has postnasal drip. Clear rhinorrhea. No sneezing. Fevers. States she uses Flonase one spray each nostril daily for this. Sometimes uses a nasal saline rinse.  PMH: Current smoker.   ROS see history of present illness  Objective  Physical  Exam Filed Vitals:   02/26/15 1050  BP: 124/78  Pulse: 80  Temp: 99.1 F (37.3 C)    Physical Exam  Constitutional: She is well-developed, well-nourished, and in no distress.  HENT:  Head: Normocephalic and atraumatic.  Right Ear: External ear normal.  Left Ear: External ear normal.  Mouth/Throat: Oropharynx is clear and moist. No oropharyngeal exudate.  Eyes: Conjunctivae are normal. Pupils are equal, round, and reactive to light.  Neck: Neck supple.  Cardiovascular: Normal rate, regular rhythm and normal heart sounds.  Exam reveals no gallop and no friction rub.   No murmur heard. Pulmonary/Chest: Effort normal and breath sounds normal. No respiratory distress. She has no wheezes. She has no rales.  Abdominal: Soft. Bowel sounds are normal. She exhibits no distension. There is no tenderness. There is no rebound and no guarding.  Genitourinary:  Declined pelvic exam  Lymphadenopathy:    She has no cervical adenopathy.  Neurological: She is alert. Gait normal.  Skin: Skin is warm and dry. She is not diaphoretic.  Psychiatric:  Mood depressed, affect mildly flat     Assessment/Plan: Please see individual problem list.  Anxiety and depression Anxiety mildly improved, depression mildly worsened. No SI or HI. We'll increase Prozac to 40 mg daily. Patient will continue to follow with psychiatry. She is advised to follow-up with the therapist. She was previously given information on therapist. Given return precautions.  Iron deficiency Patient found be iron deficient with no anemia  on last blood work. She's had some nausea with her iron tablet. No other symptoms with this. She wants to try taking it at night prior to going to bed to see if this helps. She will take with food. She'll continue to monitor. We will check a CBC and iron at her next visit.  Irregular periods Irregular periods over the last year. She is otherwise asymptomatic. She declined pelvic exam today and opted for  her gynecologist to complete this. With hot flashes and period changes this could be consistent with perimenopause. Will have patient follow-up with her gynecologist for pelvic exam and further evaluation of this issue. Patient states she will contact them for an appointment. Given return precautions.  Allergic rhinitis Increase Flonase to 2 sprays each nostril daily.    Meds ordered this encounter  Medications  . FLUoxetine (PROZAC) 20 MG tablet    Sig: Take 2 tablets (40 mg total) by mouth daily.    Dispense:  60 tablet    Refill:  3  . fluticasone (FLONASE) 50 MCG/ACT nasal spray    Sig: Place 2 sprays into both nostrils daily.    Dispense:  16 g    Refill:  [place]

## 2015-03-06 ENCOUNTER — Encounter: Payer: Self-pay | Admitting: Family Medicine

## 2015-03-06 ENCOUNTER — Other Ambulatory Visit: Payer: Self-pay | Admitting: Surgical

## 2015-03-06 MED ORDER — VITAMIN D (ERGOCALCIFEROL) 1.25 MG (50000 UNIT) PO CAPS: 50000.0000 [IU] | ORAL_CAPSULE | ORAL | Status: DC

## 2015-03-13 DIAGNOSIS — F331 Major depressive disorder, recurrent, moderate: Secondary | ICD-10-CM | POA: Diagnosis not present

## 2015-03-13 DIAGNOSIS — G894 Chronic pain syndrome: Secondary | ICD-10-CM | POA: Diagnosis not present

## 2015-03-13 DIAGNOSIS — Z79891 Long term (current) use of opiate analgesic: Secondary | ICD-10-CM | POA: Diagnosis not present

## 2015-03-13 DIAGNOSIS — M79671 Pain in right foot: Secondary | ICD-10-CM | POA: Diagnosis not present

## 2015-03-25 ENCOUNTER — Encounter: Payer: Self-pay | Admitting: Family Medicine

## 2015-03-26 ENCOUNTER — Ambulatory Visit: Admitting: Family Medicine

## 2015-03-26 ENCOUNTER — Other Ambulatory Visit: Payer: Self-pay | Admitting: Family Medicine

## 2015-03-26 DIAGNOSIS — M792 Neuralgia and neuritis, unspecified: Secondary | ICD-10-CM | POA: Insufficient documentation

## 2015-03-26 DIAGNOSIS — G7100 Muscular dystrophy, unspecified: Secondary | ICD-10-CM | POA: Insufficient documentation

## 2015-03-26 DIAGNOSIS — L98499 Non-pressure chronic ulcer of skin of other sites with unspecified severity: Secondary | ICD-10-CM | POA: Insufficient documentation

## 2015-03-26 MED ORDER — LORATADINE 10 MG PO TABS: 10.0000 mg | ORAL_TABLET | Freq: Every day | ORAL | Status: DC

## 2015-03-26 NOTE — Telephone Encounter (Signed)
Pt called back after sending message via email about a prescription for a preventative medication. Pt feels the OTC meds do not work well for her. Please advise, thanks

## 2015-05-05 ENCOUNTER — Telehealth: Payer: Self-pay

## 2015-05-05 ENCOUNTER — Telehealth: Payer: Self-pay | Admitting: Family Medicine

## 2015-05-05 MED ORDER — MONTELUKAST SODIUM 10 MG PO TABS: 10.0000 mg | ORAL_TABLET | Freq: Every day | ORAL | Status: DC

## 2015-05-05 NOTE — Telephone Encounter (Signed)
Attempted to call patient, left a VM to return my call. Patient called [facility] on the 16th due to serious allergy problems.  She had been taking Flonase and Zyrtec.  Patient has a cough and drainage note.  Says that benadryl makes her loopy.  No other symptoms.  Was advised to follow up at the office in 24hours.

## 2015-05-05 NOTE — Addendum Note (Signed)
Addended by: Leone Haven on: 05/05/2015 02:04 PM   Modules accepted: Orders

## 2015-05-05 NOTE — Telephone Encounter (Signed)
Spoke with the patient, advised that new meds where at the pharmacy.

## 2015-05-05 NOTE — Telephone Encounter (Signed)
I will send in singulair for her to try. If she is having that much trouble she should follow-up in the office. Thanks.

## 2015-05-05 NOTE — Telephone Encounter (Signed)
Spoke with the patient, She has had issues with her allergies flaring up, she has been using her flonase, taking zyrtec and using a neti pot.  Drainage is clear, productive cough which is giving her a headache.  Is there anything you can advise to assist her more then she is using already? She is unable to come in for an appt as she is having work done at her home.

## 2015-05-05 NOTE — Telephone Encounter (Signed)
Patient returning your call.

## 2015-05-05 NOTE — Telephone Encounter (Signed)
Spoke with the patient.  She states that the medication name is dexachlorpheniramine that she used to be prescribed in the past.  Is this something you could prescribe for her?

## 2015-05-05 NOTE — Telephone Encounter (Signed)
Pt advised that she found some old medication that she took for the same issue that you spoke about. Pt wanted to know if she could still take the medication. Pt was advised not to take medication due to the medication being 42 yrs old. Please advise pt/msn

## 2015-05-06 NOTE — Telephone Encounter (Signed)
I would like her to try the singulair first, then if no improvement we could consider alternative treatments.

## 2015-05-15 DIAGNOSIS — F331 Major depressive disorder, recurrent, moderate: Secondary | ICD-10-CM | POA: Diagnosis not present

## 2015-05-15 DIAGNOSIS — G894 Chronic pain syndrome: Secondary | ICD-10-CM | POA: Diagnosis not present

## 2015-05-20 ENCOUNTER — Other Ambulatory Visit: Payer: Self-pay | Admitting: Family Medicine

## 2015-05-21 NOTE — Telephone Encounter (Signed)
Please advise on refill.

## 2015-05-21 NOTE — Telephone Encounter (Signed)
Sent to pharmacy 

## 2015-05-26 ENCOUNTER — Encounter: Payer: Medicare Other | Attending: Surgery | Admitting: Surgery

## 2015-05-26 DIAGNOSIS — F17218 Nicotine dependence, cigarettes, with other nicotine-induced disorders: Secondary | ICD-10-CM | POA: Diagnosis not present

## 2015-05-26 DIAGNOSIS — L89892 Pressure ulcer of other site, stage 2: Secondary | ICD-10-CM | POA: Diagnosis not present

## 2015-05-26 DIAGNOSIS — Z87891 Personal history of nicotine dependence: Secondary | ICD-10-CM | POA: Diagnosis not present

## 2015-05-26 DIAGNOSIS — L97511 Non-pressure chronic ulcer of other part of right foot limited to breakdown of skin: Secondary | ICD-10-CM | POA: Diagnosis not present

## 2015-05-26 DIAGNOSIS — J45909 Unspecified asthma, uncomplicated: Secondary | ICD-10-CM | POA: Diagnosis not present

## 2015-05-26 DIAGNOSIS — Z716 Tobacco abuse counseling: Secondary | ICD-10-CM | POA: Diagnosis not present

## 2015-05-26 DIAGNOSIS — L84 Corns and callosities: Secondary | ICD-10-CM | POA: Insufficient documentation

## 2015-05-26 DIAGNOSIS — G600 Hereditary motor and sensory neuropathy: Secondary | ICD-10-CM | POA: Insufficient documentation

## 2015-05-26 DIAGNOSIS — G710 Muscular dystrophy: Secondary | ICD-10-CM | POA: Insufficient documentation

## 2015-05-26 DIAGNOSIS — L97512 Non-pressure chronic ulcer of other part of right foot with fat layer exposed: Secondary | ICD-10-CM | POA: Insufficient documentation

## 2015-05-26 NOTE — Progress Notes (Signed)
LAMARA, NISSEN ([id number]) Visit Report for 05/26/2015 Allergy List Details Patient Name: [name], [name]. Date of Service: 05/26/2015 9:30 AM Medical Record Number: [id number] Patient Account Number: [id number] Date of Birth/Sex: March 01, 1975 (40 y.o. Female) Treating RN: Macarthur Critchley Primary Care Physician: Caryl Bis, ERIC Other Clinician: Referring Physician: Luetta Nutting, DAVID Treating Physician/Extender: Frann Rider in Treatment: 0 Allergies Active Allergies pencillin Reaction: hives Bactrim Reaction: breathing issues Allergy Notes Electronic Signature(s) Signed: 05/26/2015 3:24:57 PM By: Rebecca Eaton, RN, Sendra Entered By: Rebecca Eaton RN, Sendra on 05/26/2015 09:42:09 [name] ([id number]) -------------------------------------------------------------------------------- Arrival Information Details Patient Name: [name]. Date of Service: 05/26/2015 9:30 AM Medical Record Number: [id number] Patient Account Number: [id number] Date of Birth/Sex: [birth year] (40 y.o. Female) Treating RN: Macarthur Critchley Primary Care Physician: Caryl Bis, ERIC Other Clinician: Referring Physician: Luetta Nutting, DAVID Treating Physician/Extender: Frann Rider in Treatment: 0 Visit Information Patient Arrived: Ambulatory Arrival Time: 09:35 Accompanied By: self Transfer Assistance: None Patient Identification Verified: Yes Secondary Verification Process Yes Completed: Electronic Signature(s) Signed: 05/26/2015 3:24:57 PM By: Rebecca Eaton RN, Sendra Entered By: Rebecca Eaton RN, Sendra on 05/26/2015 09:36:29 [name] ([id number]) -------------------------------------------------------------------------------- Clinic Level of Care Assessment Details Patient Name: [name]. Date of Service: 05/26/2015 9:30 AM Medical Record Number: [id number] Patient Account Number: [id number] Date of Birth/Sex: 05-23-1975 (40 y.o. Female) Treating RN: Macarthur Critchley Primary Care  Physician: Caryl Bis, ERIC Other Clinician: Referring Physician: Luetta Nutting, DAVID Treating Physician/Extender: Frann Rider in Treatment: 0 Clinic Level of Care Assessment Items TOOL 1 Quantity Score X - Use when EandM and Procedure is performed on INITIAL visit 1 0 ASSESSMENTS - Nursing Assessment / Reassessment X - General Physical Exam (combine w/ comprehensive assessment (listed just 1 20 below) when performed on new pt. evals) X - Comprehensive Assessment (HX, ROS, Risk Assessments, Wounds Hx, etc.) 1 25 ASSESSMENTS - Wound and Skin Assessment / Reassessment []  - Dermatologic / Skin Assessment (not related to wound area) 0 ASSESSMENTS - Ostomy and/or Continence Assessment and Care []  - Incontinence Assessment and Management 0 []  - Ostomy Care Assessment and Management (repouching, etc.) 0 PROCESS - Coordination of Care X - Simple Patient / Family Education for ongoing care 1 15 []  - Complex (extensive) Patient / Family Education for ongoing care 0 X - Staff obtains Programmer, systems, Records, Test Results / Process Orders 1 10 []  - Staff telephones HHA, Nursing Homes / Clarify orders / etc 0 []  - Routine Transfer to another Facility (non-emergent condition) 0 []  - Routine Hospital Admission (non-emergent condition) 0 []  - New Admissions / Biomedical engineer / Ordering NPWT, Apligraf, etc. 0 []  - Emergency Hospital Admission (emergent condition) 0 PROCESS - Special Needs []  - Pediatric / Minor Patient Management 0 []  - Isolation Patient Management 0 [name], Kelly M. ([id number]) []  - Hearing / Language / Visual special needs 0 []  - Assessment of Community assistance (transportation, D/C planning, etc.) 0 []  - Additional assistance / Altered mentation 0 []  - Support Surface(s) Assessment (bed, cushion, seat, etc.) 0 INTERVENTIONS - Miscellaneous []  - External ear exam 0 []  - Patient Transfer (multiple staff / Civil Service fast streamer / Similar devices) 0 []  - Simple Staple / Suture  removal (25 or less) 0 []  - Complex Staple / Suture removal (26 or more) 0 []  - Hypo/Hyperglycemic Management (do not check if billed separately) 0 X - Ankle / Brachial Index (ABI) - do not check if billed separately 1 15 Has the patient been seen at the hospital within the last three years: Yes Total Score:  85 Level Of Care: New/Established - Level 3 Electronic Signature(s) Signed: 05/26/2015 3:24:57 PM By: Rebecca Eaton, RN, Sendra Entered By: Rebecca Eaton RN, Sendra on 05/26/2015 11:44:11 [name] ([id number]) -------------------------------------------------------------------------------- Encounter Discharge Information Details Patient Name: [name]. Date of Service: 05/26/2015 9:30 AM Medical Record Number: [id number] Patient Account Number: [id number] Date of Birth/Sex: 02-Oct-1975 (40 y.o. Female) Treating RN: Ahmed Prima Primary Care Physician: Caryl Bis, ERIC Other Clinician: Referring Physician: Luetta Nutting, DAVID Treating Physician/Extender: Frann Rider in Treatment: 0 Encounter Discharge Information Items Schedule Follow-up Appointment: No Medication Reconciliation completed No and provided to Patient/Care Zadok Holaway: Provided on Clinical Summary of Care: 05/26/2015 Form Type Recipient Paper Patient [place] Electronic Signature(s) Signed: 05/26/2015 10:29:18 AM By: Ruthine Dose Entered By: Ruthine Dose on 05/26/2015 10:29:18 [name], Kelly M. ([id number]) -------------------------------------------------------------------------------- Lower Extremity Assessment Details Patient Name: [name]. Date of Service: 05/26/2015 9:30 AM Medical Record Number: [id number] Patient Account Number: [id number] Date of Birth/Sex: [birth year] (40 y.o. Female) Treating RN: Macarthur Critchley Primary Care Physician: Caryl Bis, ERIC Other Clinician: Referring Physician: MARKS, DAVID Treating Physician/Extender: Frann Rider in Treatment: 0 Edema  Assessment Assessed: [Left: No] [Right: No] Edema: [Left: N] [Right: o] Vascular Assessment Pulses: Posterior Tibial Dorsalis Pedis Palpable: [Right:Yes] Extremity colors, hair growth, and conditions: Extremity Color: [Right:Normal] Hair Growth on Extremity: [Right:Yes] Temperature of Extremity: [Right:Warm] Capillary Refill: [Right:< 3 seconds] Toe Nail Assessment Left: Right: Thick: No Discolored: No Deformed: No Improper Length and Hygiene: No Notes patient is non compressiable Electronic Signature(s) Signed: 05/26/2015 3:24:57 PM By: Rebecca Eaton, RN, Sendra Entered By: Rebecca Eaton RN, Sendra on 05/26/2015 10:09:34 [name], Kelly M. ([id number]) -------------------------------------------------------------------------------- Multi Wound Chart Details Patient Name: [name]. Date of Service: 05/26/2015 9:30 AM Medical Record Number: [id number] Patient Account Number: [id number] Date of Birth/Sex: 05/04/1975 (40 y.o. Female) Treating RN: Macarthur Critchley Primary Care Physician: Caryl Bis, ERIC Other Clinician: Referring Physician: MARKS, DAVID Treating Physician/Extender: Frann Rider in Treatment: 0 Vital Signs Height(in): 67 Pulse(bpm): 78 Weight(lbs): 153 Blood Pressure 116/77 (mmHg): Body Mass Index(BMI): 24 Temperature(F): 98.8 Respiratory Rate 20 (breaths/min): Photos: [1:No Photos] [N/A:N/A] Wound Location: [1:Right Foot - Plantar, Medial] [N/A:N/A] Wounding Event: [1:Pressure Injury] [N/A:N/A] Primary Etiology: [1:Pressure Ulcer] [N/A:N/A] Date Acquired: [1:03/24/2015] [N/A:N/A] Weeks of Treatment: [1:0] [N/A:N/A] Wound Status: [1:Open] [N/A:N/A] Measurements L x W x D 0.6x0.8x1.1 [N/A:N/A] (cm) Area (cm) : [1:0.377] [N/A:N/A] Volume (cm) : [1:0.415] [N/A:N/A] Position 1 (o'clock): 6 Maximum Distance 1 1.2 (cm): Starting Position 1 [1:12] (o'clock): Ending Position 1 [1:12] (o'clock): Maximum Distance 1 0.4 (cm): Tunneling:  [1:Yes] [N/A:N/A] Undermining: [1:Yes] [N/A:N/A] Classification: [1:Category/Stage II] [N/A:N/A] Exudate Amount: [1:Medium] [N/A:N/A] Exudate Type: [1:Serosanguineous] [N/A:N/A] Exudate Color: [1:red, brown] [N/A:N/A] Wound Margin: [1:Flat and Intact] [N/A:N/A] Exposed Structures: [1:Fascia: No Fat: No] [N/A:N/A] Tendon: No Muscle: No Joint: No Bone: No Limited to Skin Breakdown Epithelialization: None N/A N/A Periwound Skin Texture: Edema: No N/A N/A Excoriation: No Induration: No Callus: No Crepitus: No Fluctuance: No Friable: No Rash: No Scarring: No Periwound Skin Maceration: Yes N/A N/A Moisture: Moist: No Dry/Scaly: No Periwound Skin Color: Atrophie Blanche: No N/A N/A Cyanosis: No Ecchymosis: No Erythema: No Hemosiderin Staining: No Mottled: No Pallor: No Rubor: No Tenderness on Yes N/A N/A Palpation: Wound Preparation: Ulcer Cleansing: N/A N/A Rinsed/Irrigated with Saline Topical Anesthetic Applied: Other: lidocaine 4% Treatment Notes Electronic Signature(s) Signed: 05/26/2015 3:24:57 PM By: Rebecca Eaton, RN, Sendra Entered By: Rebecca Eaton RN, Sendra on 05/26/2015 10:10:06 [name] ([id number]) -------------------------------------------------------------------------------- [place] Details Patient Name: [name]. Date of Service: 05/26/2015 9:30  AM Medical Record Number: [id number] Patient Account Number: [id number] Date of Birth/Sex: 10-18-1975 (40 y.o. Female) Treating RN: Macarthur Critchley Primary Care Physician: Caryl Bis, ERIC Other Clinician: Referring Physician: Luetta Nutting, DAVID Treating Physician/Extender: Frann Rider in Treatment: 0 Active Inactive Orientation to the Wound Care Program Nursing Diagnoses: Knowledge deficit related to the wound healing center program Goals: Patient/caregiver will verbalize understanding of the [facility] Program Date Initiated: 05/26/2015 Goal Status:  Active Interventions: Provide education on orientation to the wound center Notes: Osteomyelitis Nursing Diagnoses: Potential for infection: osteomyelitis Goals: Diagnostic evaluation for osteomyelitis completed as ordered Date Initiated: 05/26/2015 Goal Status: Active Patient/caregiver will verbalize understanding of disease process and disease management Date Initiated: 05/26/2015 Goal Status: Active Signs and symptoms for osteomyelitis will be recognized and promptly addressed Date Initiated: 05/26/2015 Goal Status: Active Interventions: Assess for signs and symptoms of osteomyelitis resolution every visit Provide education on osteomyelitis Screen for HBO Notes: [name], Kelly M. ([id number]) Pressure Nursing Diagnoses: Knowledge deficit related to management of pressures ulcers Goals: Patient will remain free from development of additional pressure ulcers Date Initiated: 05/26/2015 Goal Status: Active Patient/caregiver will verbalize risk factors for pressure ulcer development Date Initiated: 05/26/2015 Goal Status: Active Patient/caregiver will verbalize understanding of pressure ulcer management Date Initiated: 05/26/2015 Goal Status: Active Interventions: Assess offloading mechanisms upon admission and as needed Notes: Wound/Skin Impairment Nursing Diagnoses: Impaired tissue integrity Goals: Patient will demonstrate a reduced rate of smoking or cessation of smoking Date Initiated: 05/26/2015 Goal Status: Active Patient/caregiver will verbalize understanding of skin care regimen Date Initiated: 05/26/2015 Goal Status: Active Ulcer/skin breakdown will have a volume reduction of 30% by week 4 Date Initiated: 05/26/2015 Goal Status: Active Ulcer/skin breakdown will have a volume reduction of 50% by week 8 Date Initiated: 05/26/2015 Goal Status: Active Ulcer/skin breakdown will have a volume reduction of 80% by week 12 Date Initiated: 05/26/2015 Goal Status: Active Ulcer/skin  breakdown will heal within 14 weeks Date Initiated: 05/26/2015 Goal Status: Active [name], [name] ([id number]) Interventions: Assess patient/caregiver ability to obtain necessary supplies Assess patient/caregiver ability to perform ulcer/skin care regimen upon admission and as needed Assess ulceration(s) every visit Provide education on smoking Provide education on ulcer and skin care Screen for HBO Notes: Electronic Signature(s) Signed: 05/26/2015 3:24:57 PM By: Rebecca Eaton, RN, Sendra Entered By: Rebecca Eaton RN, Sendra on 05/26/2015 11:43:38 [name], Kelly M. ([id number]) -------------------------------------------------------------------------------- Pain Assessment Details Patient Name: [name]. Date of Service: 05/26/2015 9:30 AM Medical Record Number: [id number] Patient Account Number: [id number] Date of Birth/Sex: 1975/06/29 (40 y.o. Female) Treating RN: Macarthur Critchley Primary Care Physician: Caryl Bis, ERIC Other Clinician: Referring Physician: Luetta Nutting, DAVID Treating Physician/Extender: Frann Rider in Treatment: 0 Active Problems Location of Pain Severity and Description of Pain Patient Has Paino Yes Site Locations Rate the pain. Current Pain Level: 7 Tolerable Pain Level: 1 Character of Pain Describe the Pain: Larence Penning, Shooting, Throbbing Pain Management and Medication Current Pain Management: Medication: Yes Electronic Signature(s) Signed: 05/26/2015 3:24:57 PM By: Rebecca Eaton, RN, Sendra Entered By: Rebecca Eaton RN, Sendra on 05/26/2015 09:37:07 [name] ([id number]) -------------------------------------------------------------------------------- Patient/Caregiver Education Details Patient Name: [name]. Date of Service: 05/26/2015 9:30 AM Medical Record Number: [id number] Patient Account Number: [id number] Date of Birth/Gender: [birth year] (40 y.o. Female) Treating RN: Macarthur Critchley Primary Care Physician: Caryl Bis, ERIC Other  Clinician: Referring Physician: Luetta Nutting, DAVID Treating Physician/Extender: Frann Rider in Treatment: 0 Education Assessment Education Provided To: Patient Education Topics Provided Welcome To The [facility]: Handouts: Welcome To The Wound Care  Center Methods: Explain/Verbal Responses: State content correctly Wound/Skin Impairment: Handouts: Caring for Your Ulcer, Skin Care Do's and Dont's Methods: Explain/Verbal Responses: State content correctly Electronic Signature(s) Signed: 05/26/2015 3:24:57 PM By: Rebecca Eaton, RN, Sendra Entered By: Rebecca Eaton RN, Sendra on 05/26/2015 10:36:14 [name], [name] ([id number]) -------------------------------------------------------------------------------- Wound Assessment Details Patient Name: [name]. Date of Service: 05/26/2015 9:30 AM Medical Record Number: [id number] Patient Account Number: [id number] Date of Birth/Sex: 1975-03-30 (40 y.o. Female) Treating RN: Macarthur Critchley Primary Care Physician: Caryl Bis, ERIC Other Clinician: Referring Physician: MARKS, DAVID Treating Physician/Extender: Frann Rider in Treatment: 0 Wound Status Wound Number: 1 Primary Pressure Ulcer Etiology: Wound Location: Right Foot - Plantar, Medial Wound Open Wounding Event: Pressure Injury Status: Date Acquired: 03/24/2015 Comorbid Anemia, Asthma, History of pressure Weeks Of Treatment: 0 History: wounds, Neuropathy Clustered Wound: No Photos Photo Uploaded By: Alric Quan on 05/26/2015 16:33:58 Wound Measurements Length: (cm) 1.4 Width: (cm) 1 Depth: (cm) 0.6 Area: (cm) 1.1 Volume: (cm) 0.66 % Reduction in Area: -191.8% % Reduction in Volume: -59% Epithelialization: None Tunneling: Yes Position (o'clock): 6 Maximum Distance: (cm) 1.2 Undermining: Yes Starting Position (o'clock): 12 Ending Position (o'clock): 12 Maximum Distance: (cm) 0.4 Wound Description Classification: Category/Stage II Wound Margin:  Flat and Intact Exudate Amount: Medium Exudate Type: Serosanguineous [name], Kelly M. ([id number]) Exudate Color: red, brown Wound Bed Exposed Structure Fascia Exposed: No Fat Layer Exposed: No Tendon Exposed: No Muscle Exposed: No Joint Exposed: No Bone Exposed: No Limited to Skin Breakdown Periwound Skin Texture Texture Color No Abnormalities Noted: No No Abnormalities Noted: No Callus: No Atrophie Blanche: No Crepitus: No Cyanosis: No Excoriation: No Ecchymosis: No Fluctuance: No Erythema: No Friable: No Hemosiderin Staining: No Induration: No Mottled: No Localized Edema: No Pallor: No Rash: No Rubor: No Scarring: No Temperature / Pain Moisture Tenderness on Palpation: Yes No Abnormalities Noted: No Dry / Scaly: No Maceration: Yes Moist: No Wound Preparation Ulcer Cleansing: Rinsed/Irrigated with Saline Topical Anesthetic Applied: Other: lidocaine 4%, Treatment Notes Wound #1 (Right, Medial, Plantar Foot) 1. Cleansed with: Clean wound with Normal Saline 2. Anesthetic Topical Lidocaine 4% cream to wound bed prior to debridement 4. Dressing Applied: Aquacel Ag 5. Secondary Dressing Applied Gauze and Kerlix/Conform Notes xray ordered, vascular test ordered [name], [name] ([id number]) Electronic Signature(s) Signed: 05/26/2015 3:24:57 PM By: Rebecca Eaton RN, Sendra Entered By: Rebecca Eaton RN, Sendra on 05/26/2015 10:19:32 [name] ([id number]) -------------------------------------------------------------------------------- Vitals Details Patient Name: [name]. Date of Service: 05/26/2015 9:30 AM Medical Record Number: [id number] Patient Account Number: [id number] Date of Birth/Sex: 1975-05-03 (40 y.o. Female) Treating RN: Macarthur Critchley Primary Care Physician: Caryl Bis, ERIC Other Clinician: Referring Physician: MARKS, DAVID Treating Physician/Extender: Frann Rider in Treatment: 0 Vital Signs Time Taken:  09:52 Temperature (F): 98.8 Height (in): 67 Pulse (bpm): 78 Source: Stated Respiratory Rate (breaths/min): 20 Weight (lbs): 153 Blood Pressure (mmHg): 116/77 Source: Stated Reference Range: 80 - 120 mg / dl Body Mass Index (BMI): 24 Electronic Signature(s) Signed: 05/26/2015 3:24:57 PM By: Rebecca Eaton RN, Sendra Entered By: Rebecca Eaton RN, Sendra on 05/26/2015 09:53:31

## 2015-05-26 NOTE — Progress Notes (Signed)
WEATHERLY, LUVIANO ([id number]) Visit Report for 05/26/2015 Abuse/Suicide Risk Screen Details Patient Name: [name], [name]. Date of Service: 05/26/2015 9:30 AM Medical Record Number: [id number] Patient Account Number: [id number] Date of Birth/Sex: 04/17/1975 (40 y.o. Female) Treating RN: Macarthur Critchley Primary Care Physician: Caryl Bis, ERIC Other Clinician: Referring Physician: MARKS, DAVID Treating Physician/Extender: Frann Rider in Treatment: 0 Abuse/Suicide Risk Screen Items Answer ABUSE/SUICIDE RISK SCREEN: Has anyone close to you tried to hurt or harm you recentlyo No Do you feel uncomfortable with anyone in your familyo No Has anyone forced you do things that you didnot want to doo No Do you have any thoughts of harming yourselfo No Patient displays signs or symptoms of abuse and/or neglect. No Electronic Signature(s) Signed: 05/26/2015 3:24:57 PM By: Rebecca Eaton RN, Sendra Entered By: Rebecca Eaton RN, Sendra on 05/26/2015 09:46:37 Sherlean Foot, [name] ([id number]) -------------------------------------------------------------------------------- Activities of Daily Living Details Patient Name: [name]. Date of Service: 05/26/2015 9:30 AM Medical Record Number: [id number] Patient Account Number: [id number] Date of Birth/Sex: [birth year] (40 y.o. Female) Treating RN: Macarthur Critchley Primary Care Physician: Caryl Bis, ERIC Other Clinician: Referring Physician: Luetta Nutting, DAVID Treating Physician/Extender: Frann Rider in Treatment: 0 Activities of Daily Living Items Answer Activities of Daily Living (Please select one for each item) Drive Automobile Completely Able Take Medications Completely Able Use Telephone Completely Able Care for Appearance Completely Able Use Toilet Completely Able Bath / Shower Completely Able Dress Self Completely Able Feed Self Completely Able Walk Completely Able Get In / Out Bed Completely Able Housework Completely Able Prepare  Meals Completely [place] Completely Able Shop for Self Completely Able Electronic Signature(s) Signed: 05/26/2015 3:24:57 PM By: Rebecca Eaton, RN, Sendra Entered By: Rebecca Eaton RN, Sendra on 05/26/2015 09:46:46 Sherlean Foot, [name] ([id number]) -------------------------------------------------------------------------------- Education Assessment Details Patient Name: [name]. Date of Service: 05/26/2015 9:30 AM Medical Record Number: [id number] Patient Account Number: [id number] Date of Birth/Sex: 1975-01-20 (40 y.o. Female) Treating RN: Macarthur Critchley Primary Care Physician: Caryl Bis, ERIC Other Clinician: Referring Physician: Luetta Nutting, DAVID Treating Physician/Extender: Frann Rider in Treatment: 0 Primary Learner Assessed: Patient Learning Preferences/Education Level/Primary Language Learning Preference: Explanation, Demonstration Preferred Language: English Cognitive Barrier Assessment/Beliefs Language Barrier: No Translator Needed: No Memory Deficit: No Emotional Barrier: No Cultural/Religious Beliefs Affecting Medical No Care: Physical Barrier Assessment Impaired Vision: No Impaired Hearing: No Decreased Hand dexterity: No Knowledge/Comprehension Assessment Knowledge Level: High Comprehension Level: High Ability to understand written High instructions: Ability to understand verbal High instructions: Motivation Assessment Anxiety Level: Calm Cooperation: Cooperative Education Importance: Acknowledges Need Interest in Health Problems: Asks Questions Perception: Coherent Willingness to Engage in Self- High Management Activities: Readiness to Engage in Self- High Management Activities: Electronic Signature(s) Signed: 05/26/2015 3:24:57 PM By: Rebecca Eaton, RN, Roni Bread, Jacari M. ([id number]) Entered By: Rebecca Eaton RN, Sendra on 05/26/2015 09:47:04 [name]  ([id number]) -------------------------------------------------------------------------------- Fall Risk Assessment Details Patient Name: [name]. Date of Service: 05/26/2015 9:30 AM Medical Record Number: [id number] Patient Account Number: [id number] Date of Birth/Sex: July 08, 1975 (40 y.o. Female) Treating RN: Macarthur Critchley Primary Care Physician: Caryl Bis, ERIC Other Clinician: Referring Physician: MARKS, DAVID Treating Physician/Extender: Frann Rider in Treatment: 0 Fall Risk Assessment Items Have you had 2 or more falls in the last 12 monthso 0 No Have you had any fall that resulted in injury in the last 12 monthso 0 No FALL RISK ASSESSMENT: History of falling - immediate or within 3 months 0 No Secondary diagnosis 15 Yes Ambulatory aid None/bed rest/wheelchair/nurse 0 Yes Crutches/cane/walker 0 No Furniture  0 No IV Access/Saline Lock 0 No Gait/Training Normal/bed rest/immobile 0 Yes Weak 0 No Impaired 0 No Mental Status Oriented to own ability 0 Yes Electronic Signature(s) Signed: 05/26/2015 3:24:57 PM By: Rebecca Eaton, RN, Sendra Entered By: Rebecca Eaton RN, Sendra on 05/26/2015 09:47:17 Jent, Kristl M. ([id number]) -------------------------------------------------------------------------------- Foot Assessment Details Patient Name: [name]. Date of Service: 05/26/2015 9:30 AM Medical Record Number: [id number] Patient Account Number: [id number] Date of Birth/Sex: [birth year] (40 y.o. Female) Treating RN: Macarthur Critchley Primary Care Physician: Caryl Bis, ERIC Other Clinician: Referring Physician: MARKS, DAVID Treating Physician/Extender: Frann Rider in Treatment: 0 Foot Assessment Items Site Locations + = Sensation present, - = Sensation absent, C = Callus, U = Ulcer R = Redness, W = Warmth, M = Maceration, PU = Pre-ulcerative lesion F = Fissure, S = Swelling, D = Dryness Assessment Right: Left: Other Deformity: No No Prior Foot  Ulcer: No No Prior Amputation: No No Charcot Joint: No No Ambulatory Status: Ambulatory Without Help Gait: Steady Electronic Signature(s) Signed: 05/26/2015 3:24:57 PM By: Rebecca Eaton, RN, Sendra Entered By: Rebecca Eaton RN, Sendra on 05/26/2015 09:48:54 Yazdi, Shadae M. ([id number]) -------------------------------------------------------------------------------- Nutrition Risk Assessment Details Patient Name: [name]. Date of Service: 05/26/2015 9:30 AM Medical Record Number: [id number] Patient Account Number: [id number] Date of Birth/Sex: 02/08/1975 (40 y.o. Female) Treating RN: Macarthur Critchley Primary Care Physician: Caryl Bis, ERIC Other Clinician: Referring Physician: MARKS, DAVID Treating Physician/Extender: Frann Rider in Treatment: 0 Height (in): Weight (lbs): Body Mass Index (BMI): Nutrition Risk Assessment Items NUTRITION RISK SCREEN: I have an illness or condition that made me change the kind and/or 0 No amount of food I eat I eat fewer than two meals per day 0 No I eat few fruits and vegetables, or milk products 0 No I have three or more drinks of beer, liquor or wine almost every day 0 No I have tooth or mouth problems that make it hard for me to eat 0 No I don't always have enough money to buy the food I need 0 No I eat alone most of the time 0 No I take three or more different prescribed or over-the-counter drugs a 1 Yes day Without wanting to, I have lost or gained 10 pounds in the last six 0 No months I am not always physically able to shop, cook and/or feed myself 0 No Nutrition Protocols Good Risk Protocol 0 No interventions needed Moderate Risk Protocol Electronic Signature(s) Signed: 05/26/2015 3:24:57 PM By: Rebecca Eaton, RN, Sendra Entered By: Rebecca Eaton RN, Sendra on 05/26/2015 09:47:24

## 2015-05-29 NOTE — Progress Notes (Signed)
[name], [name] ([id number]) Visit Report for 05/26/2015 Chief Complaint Document Details Patient Name: [name]. Date of Service: 05/26/2015 9:30 AM Medical Record Number: [id number] Patient Account Number: [id number] Date of Birth/Sex: 1975/10/31 (40 y.o. Female) Treating RN: Ahmed Prima Primary Care Physician: Caryl Bis, ERIC Other Clinician: Referring Physician: MARKS, DAVID Treating Physician/Extender: Frann Rider in Treatment: 0 Information Obtained from: Patient Chief Complaint Patient presents to the wound care center for a consult due non healing wound to the right [name] at the base of her first metatarsal head where she's had a previous right big toe amputation and this has been there for about 2 months. Electronic Signature(s) Signed: 05/26/2015 10:27:15 AM By: Christin Fudge MD, FACS Entered By: Christin Fudge on 05/26/2015 10:27:15 [name] ([id number]) -------------------------------------------------------------------------------- Debridement Details Patient Name: [name]. Date of Service: 05/26/2015 9:30 AM Medical Record Number: [id number] Patient Account Number: [id number] Date of Birth/Sex: November 12, 1975 (40 y.o. Female) Treating RN: Carolyne Fiscal, Debi Primary Care Physician: Caryl Bis, ERIC Other Clinician: Referring Physician: MARKS, DAVID Treating Physician/Extender: Frann Rider in Treatment: 0 Debridement Performed for Wound #1 Right,Medial,Plantar [name] Assessment: Performed By: Physician Christin Fudge, MD Debridement: Debridement Pre-procedure Yes Verification/Time Out Taken: Start Time: 10:12 Pain Control: Lidocaine 4% Topical Solution Level: Skin/Subcutaneous Tissue Total Area Debrided (L x 0.6 (cm) x 0.8 (cm) = 0.48 (cm) W): Tissue and other Viable, Non-Viable, Callus, Fibrin/Slough, Skin, Subcutaneous material debrided: Instrument: Forceps, Scissors Bleeding: None End Time: 10:16 Procedural Pain: 7 Post  Procedural Pain: 7 Response to Treatment: Procedure was tolerated well Post Debridement Measurements of Total Wound Length: (cm) 1.4 Stage: Category/Stage II Width: (cm) 1 Depth: (cm) 0.6 Volume: (cm) 0.66 Post Procedure Diagnosis Same as Pre-procedure Notes the wound is on the plantar aspect of the head of the first metatarsal where she's had a previous amputation the areas covered with a large callus with a pinpoint opening and sharp debridement will be required with forceps and scissors. The measurements taken at the end of the procedure.. Electronic Signature(s) Signed: 05/26/2015 10:26:45 AM By: Christin Fudge MD, FACS Signed: 05/28/2015 5:26:12 PM By: Alric Quan Entered By: Christin Fudge on 05/26/2015 10:26:45 [name] ([id number]) [name], Kelly M. ([id number]) -------------------------------------------------------------------------------- HPI Details Patient Name: [name]. Date of Service: 05/26/2015 9:30 AM Medical Record Number: [id number] Patient Account Number: [id number] Date of Birth/Sex: 02/07/1975 (40 y.o. Female) Treating RN: Ahmed Prima Primary Care Physician: Caryl Bis, ERIC Other Clinician: Referring Physician: MARKS, DAVID Treating Physician/Extender: Frann Rider in Treatment: 0 History of Present Illness Location: right first metatarsal head plantar aspect Quality: Patient reports experiencing a dull pain to affected area(s). Severity: Patient states wound are getting worse. Duration: Patient has had the wound for > 2 months prior to seeking treatment at the wound center Timing: Pain in wound is Intermittent (comes and goes Context: The wound appeared gradually over time Modifying Factors: Other treatment(s) tried include:pain clinic for her narcotics Associated Signs and Symptoms: Patient reports having difficulty standing for long periods. HPI Description: 40 year old patient sent to [name] by her PCP 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 Her notes were reviewed and there was extensive history of opioid treatment and management by the [facility] pain clinic from where she has been terminated. X-ray of the right [name] [name] was done on 03/13/2015 and was suggestive of active infection and  possible osteomyelitis. her past surgical history includes amputation of the left big toe in 2011 and amputation of the right big toe in 2015. She was sitting the wound clinic at [facility] and we will try and obtain these notes. She is a smoker and smokes about 15 cigarettes a day. Electronic Signature(s) Signed: 05/26/2015 10:23:43 AM By: Christin Fudge MD, FACS Previous Signature: 05/26/2015 9:53:37 AM Version By: Christin Fudge MD, FACS Entered By: Christin Fudge on 05/26/2015 10:23:42 [name] ([id number]) -------------------------------------------------------------------------------- Physical Exam Details Patient Name: [name]. Date of Service: 05/26/2015 9:30 AM Medical Record Number: [id number] Patient Account Number: [id number] Date of Birth/Sex: 1975-05-04 (40 y.o. Female) Treating RN: Ahmed Prima Primary Care Physician: Caryl Bis, ERIC Other Clinician: Referring Physician: MARKS, DAVID Treating Physician/Extender: Frann Rider in Treatment: 0 Constitutional . Pulse regular. Respirations normal and unlabored. Afebrile. . Eyes Nonicteric. Reactive to light. Ears, Nose, Mouth, and Throat Lips, teeth, and gums WNL.Marland Kitchen Moist mucosa without lesions. Neck supple and nontender. No palpable supraclavicular or cervical adenopathy. Normal sized without goiter. Respiratory WNL. No retractions.. Cardiovascular Pedal Pulses WNL. noncompressible blood vessels both lower extremities. No clubbing, cyanosis or edema. Gastrointestinal  (GI) Abdomen without masses or tenderness.. No liver or spleen enlargement or tenderness.. Lymphatic No adneopathy. No adenopathy. No adenopathy. Musculoskeletal Adexa without tenderness or enlargement.. Digits and nails w/o clubbing, cyanosis, infection, petechiae, ischemia, or inflammatory conditions.. Integumentary (Hair, Skin) No suspicious lesions. No crepitus or fluctuance. No peri-wound warmth or erythema. No masses.Marland Kitchen Psychiatric Judgement and insight Intact.. No evidence of depression, anxiety, or agitation.. Notes the patient has a large callosity with an ulcerated area which undermines significantly and this required sharp debridement with a forcep and scissors down to the subcutaneous tissue with minimal bleeding controlled with pressure. Scissors and forceps were used. Electronic Signature(s) Signed: 05/26/2015 10:28:30 AM By: Christin Fudge MD, FACS Entered By: Christin Fudge on 05/26/2015 10:28:29 [name] ([id number]) -------------------------------------------------------------------------------- Physician Orders Details Patient Name: [name]. Date of Service: 05/26/2015 9:30 AM Medical Record Number: [id number] Patient Account Number: [id number] Date of Birth/Sex: 1975-03-26 (40 y.o. Female) Treating RN: Macarthur Critchley Primary Care Physician: Caryl Bis, ERIC Other Clinician: Referring Physician: Luetta Nutting, DAVID Treating Physician/Extender: Frann Rider in Treatment: 0 Verbal / Phone Orders: Yes Clinician: Macarthur Critchley Read Back and Verified: Yes Diagnosis Coding Wound Cleansing Wound #1 Right,Medial,Plantar [name] o Cleanse wound with mild soap and water Primary Wound Dressing Wound #1 Right,Medial,Plantar [name] o Aquacel Ag Secondary Dressing o Gauze and Kerlix/Conform Dressing Change Frequency Wound #1 Right,Medial,Plantar [name] o Change dressing every other day. Follow-up Appointments Wound #1 Right,Medial,Plantar [name] o  Return Appointment in 1 week. Off-Loading Wound #1 Right,Medial,Plantar [name] o Other: - darco front off loader Additional Orders / Instructions Wound #1 Right,Medial,Plantar [name] o Stop Smoking Consults o Vascular - arterial duplex studies-biateral at [facility] Electronic Signature(s) Signed: 05/26/2015 3:24:57 PM By: Ardean Larsen Signed: 05/26/2015 4:11:53 PM By: Christin Fudge MD, FACS Entered By: Rebecca Eaton RN, Sendra on 05/26/2015 10:16:40 [name], Kelly M. ([id number]) [name], Kelly M. ([id number]) -------------------------------------------------------------------------------- Problem List Details Patient Name: [name]. Date of Service: 05/26/2015 9:30 AM Medical Record Number: [id number] Patient Account Number: [id number] Date of Birth/Sex: June 18, 1975 (40 y.o. Female) Treating RN: Ahmed Prima Primary Care Physician: Caryl Bis, ERIC Other Clinician: Referring Physician: Luetta Nutting, DAVID Treating Physician/Extender: Frann Rider in Treatment: 0 Active Problems ICD-10 Encounter Code Description Active Date Diagnosis L97.512 Non-pressure chronic ulcer of other part of right [name] with 05/26/2015 Yes fat layer exposed  L84 Corns and callosities 05/26/2015 Yes G71.0 Muscular dystrophy 05/26/2015 Yes F17.218 Nicotine dependence, cigarettes, with other nicotine- 05/26/2015 Yes induced disorders Inactive Problems Resolved Problems Electronic Signature(s) Signed: 05/26/2015 10:25:19 AM By: Christin Fudge MD, FACS Entered By: Christin Fudge on 05/26/2015 10:25:19 [name], Kelly M. ([id number]) -------------------------------------------------------------------------------- Progress Note Details Patient Name: [name]. Date of Service: 05/26/2015 9:30 AM Medical Record Number: [id number] Patient Account Number: [id number] Date of Birth/Sex: 1975-11-29 (40 y.o. Female) Treating RN: Ahmed Prima Primary Care Physician: Caryl Bis, ERIC Other  Clinician: Referring Physician: MARKS, DAVID Treating Physician/Extender: Frann Rider in Treatment: 0 Subjective Chief Complaint Information obtained from Patient Patient presents to the wound care center for a consult due non healing wound to the right [name] at the base of her first metatarsal head where she's had a previous right big toe amputation and this has been there for about 2 months. History of Present Illness (HPI) The following HPI elements were documented for the patient's wound: Location: right first metatarsal head plantar aspect Quality: Patient reports experiencing a dull pain to affected area(s). Severity: Patient states wound are getting worse. Duration: Patient has had the wound for > 2 months prior to seeking treatment at the wound center Timing: Pain in wound is Intermittent (comes and goes Context: The wound appeared gradually over time Modifying Factors: Other treatment(s) tried include:pain clinic for her narcotics Associated Signs and Symptoms: Patient reports having difficulty standing for long periods. 40 year old patient sent to [name] by her PCP 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 Her notes were reviewed and there was extensive history of opioid treatment and management by the [facility] pain clinic from where she has been terminated. X-ray of the right [name] [name] was done on 03/13/2015 and was suggestive of active infection and possible osteomyelitis. her past surgical history includes amputation of the left big toe in 2011 and amputation of the right big toe in 2015. She was sitting the wound clinic at [facility] and we will try and obtain these notes. She is a smoker and smokes about 15 cigarettes a day. Wound History Patient presents with 1 open wound that has been present for  approximately 2 months. Patient has been treating wound in the following manner: neosporin. Laboratory tests have not been performed in the last month. Patient reportedly has tested positive for an antibiotic resistant organism. Patient reportedly has tested positive for osteomyelitis. Patient reportedly has had testing performed to evaluate circulation in the legs. Patient History Information obtained from Patient. ALVAH, DUTKIEWICZ ([id number]) Allergies pencillin (Reaction: hives), Bactrim (Reaction: breathing issues) Family History Diabetes, Hypertension - Father, No family history of Cancer, Heart Disease, Kidney Disease, Lung Disease, Seizures, Stroke, Thyroid Problems, Tuberculosis. Social History Current every day smoker, Marital Status - Married, Alcohol Use - Rarely, Drug Use - No History, Caffeine Use - Daily. Medical History Eyes Denies history of Cataracts, Glaucoma, Optic Neuritis Ear/Nose/Mouth/Throat Denies history of Chronic sinus problems/congestion, Middle ear problems Hematologic/Lymphatic Patient has history of Anemia Denies history of Hemophilia, Human Immunodeficiency Virus, Lymphedema, Sickle Cell Disease Respiratory Patient has history of Asthma Denies history of Chronic Obstructive Pulmonary Disease (COPD), Pneumothorax, Sleep Apnea, Tuberculosis Cardiovascular Denies history of Arrhythmia, Congestive Heart Failure, Coronary Artery Disease, Deep Vein Thrombosis, Hypertension, Hypotension, Myocardial Infarction, Peripheral Arterial Disease, Peripheral Venous Disease, Phlebitis Gastrointestinal Denies history of Cirrhosis , Colitis,  Crohn s, Hepatitis A, Hepatitis B, Hepatitis C Endocrine Denies history of Type I Diabetes, Type II Diabetes Genitourinary Denies history of End Stage Renal Disease Immunological Denies history of Lupus Erythematosus, Raynaud s, Scleroderma Integumentary (Skin) Patient has history of History of pressure wounds Denies  history of History of Burn Musculoskeletal Patient has history of Osteomyelitis Denies history of Gout, Rheumatoid Arthritis, Osteoarthritis Neurologic Patient has history of Neuropathy - inflammatory and toxic Denies history of Dementia, Quadriplegia, Paraplegia, Seizure Disorder Oncologic Denies history of Received Chemotherapy, Received Radiation Psychiatric Denies history of Anorexia/bulimia, Confinement Anxiety [name], Kelly M. ([id number]) Medical And Surgical History Notes Constitutional Symptoms (General Health) chronic pain, [name] pain, tooth pain, irregular periods, allergic rhinitis, continuous opioid dependence, long term drug therapy Integumentary (Skin) ulcer of [name] Musculoskeletal muscular dystrophy, acquired deformities of the toe, closed dislocation of trasometatarsal joint, , abscess or cellulitis of [name], Neurologic neuropathic arthritis, neuropathic pain, paraneoplastic neuropahty Psychiatric anxiety, depression Review of Systems (ROS) Constitutional Symptoms (General Health) The patient has no complaints or symptoms. Eyes The patient has no complaints or symptoms. Ear/Nose/Mouth/Throat The patient has no complaints or symptoms. Hematologic/Lymphatic The patient has no complaints or symptoms. Respiratory The patient has no complaints or symptoms. Cardiovascular The patient has no complaints or symptoms. Gastrointestinal Complains or has symptoms of Nausea. Denies complaints or symptoms of Frequent diarrhea, Vomiting. Endocrine The patient has no complaints or symptoms. Genitourinary The patient has no complaints or symptoms. Immunological The patient has no complaints or symptoms. Integumentary (Skin) Complains or has symptoms of Wounds. Denies complaints or symptoms of Bleeding or bruising tendency, Breakdown, Swelling. Musculoskeletal The patient has no complaints or symptoms. Neurologic The patient has no complaints or  symptoms. Oncologic The patient has no complaints or symptoms. [name], HARRI. ([id number]) Objective Constitutional Pulse regular. Respirations normal and unlabored. Afebrile. Vitals Time Taken: 9:52 AM, Height: 67 in, Source: Stated, Weight: 153 lbs, Source: Stated, BMI: 24, Temperature: 98.8 F, Pulse: 78 bpm, Respiratory Rate: 20 breaths/min, Blood Pressure: 116/77 mmHg. Eyes Nonicteric. Reactive to light. Ears, Nose, Mouth, and Throat Lips, teeth, and gums WNL.Marland Kitchen Moist mucosa without lesions. Neck supple and nontender. No palpable supraclavicular or cervical adenopathy. Normal sized without goiter. Respiratory WNL. No retractions.. Cardiovascular Pedal Pulses WNL. noncompressible blood vessels both lower extremities. No clubbing, cyanosis or edema. Gastrointestinal (GI) Abdomen without masses or tenderness.. No liver or spleen enlargement or tenderness.. Lymphatic No adneopathy. No adenopathy. No adenopathy. Musculoskeletal Adexa without tenderness or enlargement.. Digits and nails w/o clubbing, cyanosis, infection, petechiae, ischemia, or inflammatory conditions.Marland Kitchen Psychiatric Judgement and insight Intact.. No evidence of depression, anxiety, or agitation.. General Notes: the patient has a large callosity with an ulcerated area which undermines significantly and this required sharp debridement with a forcep and scissors down to the subcutaneous tissue with minimal bleeding controlled with pressure. Scissors and forceps were used. Integumentary (Hair, Skin) No suspicious lesions. No crepitus or fluctuance. No peri-wound warmth or erythema. No masses.. Wound #1 status is Open. Original cause of wound was Pressure Injury. The wound is located on the [place]. The wound measures 1.4cm length x 1cm width x 0.6cm depth; 1.1cm^2 area and 0.66cm^3 volume. The wound is limited to skin breakdown. Tunneling has been noted at 6:00 with a [place], Larosa M.  ([id number]) maximum distance of 1.2cm. Undermining begins at 12:00 and ends at 12:00 with a maximum distance of 0.4cm. There is a medium amount of serosanguineous drainage noted. The wound margin is flat and intact. The periwound skin appearance exhibited:  Maceration. The periwound skin appearance did not exhibit: Callus, Crepitus, Excoriation, Fluctuance, Friable, Induration, Localized Edema, Rash, Scarring, Dry/Scaly, Moist, Atrophie Blanche, Cyanosis, Ecchymosis, Hemosiderin Staining, Mottled, Pallor, Rubor, Erythema. The periwound has tenderness on palpation. 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 She has a plantar ulcer on the head of the right first metatarsal head. I have recommended: 1. Dressing changes with Aquacel Ag and offloading with Darco front offloading shoe. She should also bring her healing boot which she has at home and we will review this before during her another one. 2. X-ray of the right [name] 3. Arterial duplex studies of both lower extremities 4. Stopping smoking completely and have discussed this in great detail and spent over 3 minutes discussing the risks benefits alternatives and all the possible methods 5. Review of all her old notes and x-ray reports from her previous wound care She has had all questions answered and will be regular and compliant Procedures Wound #1 Wound #1 is a Pressure Ulcer located on the Right,Medial,Plantar [name] . There was a Skin/Subcutaneous Tissue Debridement [id number]) debridement with total area of  0.48 sq cm performed by Christin Fudge, MD. with the following instrument(s): Forceps and Scissors to remove Viable and Non-Viable tissue/material including Fibrin/Slough, Skin, Callus, and Subcutaneous after achieving pain control using Lidocaine 4% Topical Solution. A time out was conducted prior to the start of the procedure. There was no bleeding. The [name], Kelly M. ([id number]) procedure was tolerated well with a pain level of 7 throughout and a pain level of 7 following the procedure. Post Debridement Measurements: 1.4cm length x 1cm width x 0.6cm depth; 0.66cm^3 volume. Post debridement Stage noted as Category/Stage II. Post procedure Diagnosis Wound #1: Same as Pre-Procedure General Notes: the wound is on the plantar aspect of the head of the first metatarsal where she's had a previous amputation the areas covered with a large callus with a pinpoint opening and sharp debridement will be required with forceps and scissors. The measurements taken at the end of the procedure... Plan Wound Cleansing: Wound #1 Right,Medial,Plantar [name]: Cleanse wound with mild soap and water Primary Wound Dressing: Wound #1 Right,Medial,Plantar [name]: Aquacel Ag Secondary Dressing: Gauze and Kerlix/Conform Dressing Change Frequency: Wound #1 Right,Medial,Plantar [name]: Change dressing every other day. Follow-up Appointments: Wound #1 Right,Medial,Plantar [name]: Return Appointment in 1 week. Off-Loading: Wound #1 Right,Medial,Plantar [name]: Other: - darco front off loader Additional Orders / Instructions: Wound #1 Right,Medial,Plantar [name]: Stop Smoking Consults ordered were: Vascular - arterial duplex studies-biateral at [facility] 40 year old patient who has a nicotine addiction and goes to pain clinic for chronic problems with muscular dystrophy now comes to [name] with a history of bilateral great toe amputations in the past due to osteomyelitis. She has a plantar ulcer on the head of the right first  metatarsal head. I have recommended: DEARA, BRICCO. ([id number]) 1. Dressing changes with Aquacel Ag and offloading with Darco front offloading shoe. She should also bring her healing boot which she has at home and we will review this before during her another one. 2. X-ray of the right [name] 3. Arterial duplex studies of both  lower extremities 4. Stopping smoking completely and have discussed this in great detail and spent over 3 minutes discussing the risks benefits alternatives and all the possible methods 5. Review of all her old notes and x-ray reports from her previous wound care She has had all questions answered and will be regular and compliant Electronic Signature(s) Signed: 05/26/2015 2:47:19 PM By: Christin Fudge MD, FACS Previous Signature: 05/26/2015 10:32:48 AM Version By: Christin Fudge MD, FACS Entered By: Christin Fudge on 05/26/2015 14:47:19 [name], Kelly M. ([id number]) -------------------------------------------------------------------------------- ROS/PFSH Details Patient Name: [name]. Date of Service: 05/26/2015 9:30 AM Medical Record Number: [id number] Patient Account Number: [id number] Date of Birth/Sex: 07/26/1975 (40 y.o. Female) Treating RN: Macarthur Critchley Primary Care Physician: Caryl Bis, ERIC Other Clinician: Referring Physician: MARKS, DAVID Treating Physician/Extender: Frann Rider in Treatment: 0 Information Obtained From Patient Wound History Do you currently have one or more open woundso Yes How many open wounds do you currently haveo 1 Approximately how long have you had your woundso 2 months How have you been treating your wound(s) until nowo neosporin Has your wound(s) ever healed and then re-openedo No Have you had any lab work done in the past montho No Have you tested positive for an antibiotic resistant organism (MRSA, VRE)o Yes Have you tested positive for osteomyelitis (bone infection)o Yes Have you had any tests for  circulation on your legso Yes Gastrointestinal Complaints and Symptoms: Positive for: Nausea Negative for: Frequent diarrhea; Vomiting Medical History: Negative for: Cirrhosis ; Colitis; Crohnos; Hepatitis A; Hepatitis B; Hepatitis C Integumentary (Skin) Complaints and Symptoms: Positive for: Wounds Negative for: Bleeding or bruising tendency; Breakdown; Swelling Medical History: Positive for: History of pressure wounds Negative for: History of Burn Past Medical History Notes: ulcer of [name] Constitutional Symptoms (General Health) Complaints and Symptoms: No Complaints or Symptoms Medical History: Past Medical History Notes: chronic pain, [name] pain, tooth pain, irregular periods, allergic rhinitis, continuous opioid dependence, long [name], Kelly M. ([id number]) term drug therapy Eyes Complaints and Symptoms: No Complaints or Symptoms Medical History: Negative for: Cataracts; Glaucoma; Optic Neuritis Ear/Nose/Mouth/Throat Complaints and Symptoms: No Complaints or Symptoms Medical History: Negative for: Chronic sinus problems/congestion; Middle ear problems Hematologic/Lymphatic Complaints and Symptoms: No Complaints or Symptoms Medical History: Positive for: Anemia Negative for: Hemophilia; Human Immunodeficiency Virus; Lymphedema; Sickle Cell Disease Respiratory Complaints and Symptoms: No Complaints or Symptoms Medical History: Positive for: Asthma Negative for: Chronic Obstructive Pulmonary Disease (COPD); Pneumothorax; Sleep Apnea; Tuberculosis Cardiovascular Complaints and Symptoms: No Complaints or Symptoms Medical History: Negative for: Arrhythmia; Congestive Heart Failure; Coronary Artery Disease; Deep Vein Thrombosis; Hypertension; Hypotension; Myocardial Infarction; Peripheral Arterial Disease; Peripheral Venous Disease; Phlebitis Endocrine Complaints and Symptoms: No Complaints or Symptoms [name], Kelly M. ([id number]) Medical History: Negative  for: Type I Diabetes; Type II Diabetes Genitourinary Complaints and Symptoms: No Complaints or Symptoms Medical History: Negative for: End Stage Renal Disease Immunological Complaints and Symptoms: No Complaints or Symptoms Medical History: Negative for: Lupus Erythematosus; Raynaudos; Scleroderma Musculoskeletal Complaints and Symptoms: No Complaints or Symptoms Medical History: Positive for: Osteomyelitis Negative for: Gout; Rheumatoid Arthritis; Osteoarthritis Past Medical History Notes: muscular dystrophy, acquired deformities of the toe, closed dislocation of trasometatarsal joint, , abscess or cellulitis of [name], Neurologic Complaints and Symptoms: No Complaints or Symptoms Medical History: Positive for: Neuropathy - inflammatory and toxic Negative for: Dementia; Quadriplegia; Paraplegia; Seizure Disorder Past Medical History Notes: neuropathic arthritis, neuropathic pain, paraneoplastic neuropahty Oncologic Complaints and Symptoms: No Complaints or Symptoms Medical History: Negative for: Received Chemotherapy; Received Radiation Psychiatric  [name], [name] ([id number]) Medical History: Negative for: Anorexia/bulimia; Confinement Anxiety Past Medical History Notes: anxiety, depression Family and Social History Cancer: No; Diabetes: Yes; Heart Disease: No; Hypertension: Yes - Father; Kidney Disease: No; Lung Disease: No; Seizures: No; Stroke: No; Thyroid Problems: No; Tuberculosis: No; Current every day smoker; Marital Status - Married; Alcohol Use: Rarely; Drug Use: No History; Caffeine Use: Daily; Financial Concerns: No; Food, Clothing or Shelter Needs: No; Support System Lacking: No; Transportation Concerns: No; Advanced Directives: No; Patient does not want information on Advanced Directives; Do not resuscitate: No; Living Will: No; Medical Power of Attorney: No Physician Affirmation I have reviewed and agree with the above information. Electronic  Signature(s) Signed: 05/26/2015 3:24:57 PM By: Ardean Larsen Signed: 05/26/2015 4:11:53 PM By: Christin Fudge MD, FACS Previous Signature: 05/26/2015 9:50:04 AM Version By: Christin Fudge MD, FACS Entered By: Rebecca Eaton RN, Sendra on 05/26/2015 12:09:06 [name] ([id number]) -------------------------------------------------------------------------------- SuperBill Details Patient Name: [name]. Date of Service: 05/26/2015 Medical Record Number: [id number] Patient Account Number: [id number] Date of Birth/Sex: [birth year] (40 y.o. 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 L84 Corns and callosities G71.0 Muscular dystrophy F17.218 Nicotine dependence, cigarettes, with other nicotine-induced disorders Facility Procedures CPT4 Code Description: [id number] - WOUND CARE VISIT-LEV 3 EST PT Modifier: Quantity: 1 CPT4 Code Description: [id number] - DEB SUBQ TISSUE 20 SQ CM/< ICD-10 Description Diagnosis L97.512 Non-pressure chronic ulcer of other part of right foo L84 Corns and callosities Modifier: t with fat la Quantity: 1 yer exposed CPT4 Code Description: [id number] CESSATION 3-10MINS ICD-10 Description Diagnosis L97.512 Non-pressure chronic ulcer of other part of right foo F17.218 Nicotine dependence, cigarettes, with other nicotine- L84 Corns and callosities Modifier: t with fat la induced disor Quantity: 1 yer exposed ders Physician Procedures CPT4 Code Description: [id number] - WC PHYS LEVEL 4 - NEW PT ICD-10 Description Diagnosis L97.512 Non-pressure chronic ulcer of other part of right fo L84 Corns and callosities G71.0 Muscular dystrophy F17.218 Nicotine dependence,  cigarettes, with  other nicotine [name], Pairlee M. ([id number]) Modifier: 25 ot with fat lay -induced disord Quantity: 1 er exposed Special educational needs teacher) Signed: 05/26/2015 3:24:57 PM By: Ardean Larsen Signed: 05/26/2015 4:11:53 PM By: Christin Fudge MD, FACS Previous Signature: 05/26/2015 10:33:35 AM Version By: Christin Fudge MD, FACS Entered By: Rebecca Eaton RN, Sendra on 05/26/2015 11:44:55

## 2015-06-01 ENCOUNTER — Other Ambulatory Visit: Payer: Self-pay | Admitting: Family Medicine

## 2015-06-03 ENCOUNTER — Other Ambulatory Visit: Payer: Self-pay | Admitting: Surgery

## 2015-06-03 ENCOUNTER — Ambulatory Visit
Admission: RE | Admit: 2015-06-03 | Discharge: 2015-06-03 | Disposition: A | Discharge status: Home / Self Care | Payer: Medicare Other | Source: Ambulatory Visit | Attending: Surgery | Admitting: Surgery

## 2015-06-03 DIAGNOSIS — S91301A Unspecified open wound, right foot, initial encounter: Secondary | ICD-10-CM | POA: Insufficient documentation

## 2015-06-03 DIAGNOSIS — Z89411 Acquired absence of right great toe: Secondary | ICD-10-CM | POA: Diagnosis not present

## 2015-06-03 DIAGNOSIS — S91309D Unspecified open wound, unspecified foot, subsequent encounter: Secondary | ICD-10-CM

## 2015-06-03 DIAGNOSIS — M7989 Other specified soft tissue disorders: Secondary | ICD-10-CM | POA: Diagnosis not present

## 2015-06-05 ENCOUNTER — Ambulatory Visit: Admitting: Surgery

## 2015-06-05 DIAGNOSIS — M791 Myalgia: Secondary | ICD-10-CM | POA: Diagnosis not present

## 2015-06-05 DIAGNOSIS — M79671 Pain in right foot: Secondary | ICD-10-CM | POA: Diagnosis not present

## 2015-06-05 DIAGNOSIS — M25579 Pain in unspecified ankle and joints of unspecified foot: Secondary | ICD-10-CM | POA: Diagnosis not present

## 2015-06-05 DIAGNOSIS — M8660 Other chronic osteomyelitis, unspecified site: Secondary | ICD-10-CM | POA: Diagnosis not present

## 2015-06-05 DIAGNOSIS — G894 Chronic pain syndrome: Secondary | ICD-10-CM | POA: Diagnosis not present

## 2015-06-05 DIAGNOSIS — M869 Osteomyelitis, unspecified: Secondary | ICD-10-CM | POA: Diagnosis not present

## 2015-06-12 ENCOUNTER — Other Ambulatory Visit: Payer: Self-pay | Admitting: Surgery

## 2015-06-12 ENCOUNTER — Encounter: Payer: Medicare Other | Admitting: Surgery

## 2015-06-12 DIAGNOSIS — G710 Muscular dystrophy: Secondary | ICD-10-CM | POA: Diagnosis not present

## 2015-06-12 DIAGNOSIS — F17218 Nicotine dependence, cigarettes, with other nicotine-induced disorders: Secondary | ICD-10-CM | POA: Diagnosis not present

## 2015-06-12 DIAGNOSIS — J45909 Unspecified asthma, uncomplicated: Secondary | ICD-10-CM | POA: Diagnosis not present

## 2015-06-12 DIAGNOSIS — L84 Corns and callosities: Secondary | ICD-10-CM | POA: Diagnosis not present

## 2015-06-12 DIAGNOSIS — L97512 Non-pressure chronic ulcer of other part of right foot with fat layer exposed: Secondary | ICD-10-CM | POA: Diagnosis not present

## 2015-06-12 DIAGNOSIS — G600 Hereditary motor and sensory neuropathy: Secondary | ICD-10-CM | POA: Diagnosis not present

## 2015-06-12 DIAGNOSIS — L89892 Pressure ulcer of other site, stage 2: Secondary | ICD-10-CM | POA: Diagnosis not present

## 2015-06-12 NOTE — Progress Notes (Addendum)
EMY, TARANTINO ([id number]) Visit Report for 06/12/2015 Arrival Information Details Patient Name: [name], [name]. Date of Service: 06/12/2015 10:00 AM Medical Record Number: [id number] Patient Account Number: [id number] Date of Birth/Sex: 1975-03-14 (40 y.o. Female) Treating RN: Afful, RN, BSN, Velva Harman Primary Care Physician: Caryl Bis, ERIC Other Clinician: Referring Physician: Caryl Bis, ERIC Treating Physician/Extender: Frann Rider in Treatment: 2 Visit Information History Since Last Visit Added or deleted any medications: No Patient Arrived: Ambulatory Any new allergies or adverse reactions: No Arrival Time: 09:56 Had a fall or experienced change in No Accompanied By: self activities of daily living that may affect Transfer Assistance: None risk of falls: Patient Identification Verified: Yes Signs or symptoms of abuse/neglect since last No Secondary Verification Process Yes visito Completed: Has Dressing in Place as Prescribed: Yes Patient Requires Transmission-Based No Pain Present Now: No Precautions: Patient Has Alerts: No Electronic Signature(s) Signed: 06/12/2015 9:56:59 AM By: Regan Lemming BSN, RN Entered By: Regan Lemming on 06/12/2015 09:56:58 Maeder, Jeorgia M. ([id number]) -------------------------------------------------------------------------------- Encounter Discharge Information Details Patient Name: [name]. Date of Service: 06/12/2015 10:00 AM Medical Record Number: [id number] Patient Account Number: [id number] Date of Birth/Sex: Sep 12, 1975 (40 y.o. Female) Treating RN: Afful, RN, BSN, Velva Harman Primary Care Physician: Caryl Bis, ERIC Other Clinician: Referring Physician: Caryl Bis, ERIC Treating Physician/Extender: Frann Rider in Treatment: 2 Encounter Discharge Information Items Discharge Pain Level: 0 Discharge Condition: Stable Ambulatory Status: Ambulatory Discharge Destination: Home Transportation: Private  Auto Accompanied By: self Schedule Follow-up Appointment: No Medication Reconciliation completed and provided to Patient/Care No Ely Spragg: Provided on Clinical Summary of Care: 06/12/2015 Form Type Recipient Paper Patient [place] Electronic Signature(s) Signed: 06/12/2015 10:24:01 AM By: Ruthine Dose Entered By: Ruthine Dose on 06/12/2015 10:24:01 Lueras, Caley M. ([id number]) -------------------------------------------------------------------------------- Lower Extremity Assessment Details Patient Name: [name]. Date of Service: 06/12/2015 10:00 AM Medical Record Number: [id number] Patient Account Number: [id number] Date of Birth/Sex: 09-07-1975 (40 y.o. Female) Treating RN: Afful, RN, BSN, Velva Harman Primary Care Physician: Caryl Bis, ERIC Other Clinician: Referring Physician: Caryl Bis, ERIC Treating Physician/Extender: Frann Rider in Treatment: 2 Vascular Assessment Pulses: Posterior Tibial Dorsalis Pedis Palpable: [Right:Yes] Extremity colors, hair growth, and conditions: Extremity Color: [Right:Normal] Hair Growth on Extremity: [Right:No] Temperature of Extremity: [Right:Warm] Capillary Refill: [Right:< 3 seconds] Toe Nail Assessment Left: Right: Thick: No Discolored: No Deformed: No Improper Length and Hygiene: No Electronic Signature(s) Signed: 06/12/2015 9:57:35 AM By: Regan Lemming BSN, RN Entered By: Regan Lemming on 06/12/2015 09:57:34 Buehrle, Japji M. ([id number]) -------------------------------------------------------------------------------- Multi Wound Chart Details Patient Name: [name]. Date of Service: 06/12/2015 10:00 AM Medical Record Number: [id number] Patient Account Number: [id number] Date of Birth/Sex: 12-20-1975 (40 y.o. Female) Treating RN: Baruch Gouty, RN, BSN, Velva Harman Primary Care Physician: Caryl Bis, ERIC Other Clinician: Referring Physician: Caryl Bis, ERIC Treating Physician/Extender: Frann Rider in Treatment:  2 Vital Signs Height(in): 67 Pulse(bpm): 76 Weight(lbs): 153 Blood Pressure 121/76 (mmHg): Body Mass Index(BMI): 24 Temperature(F): 98.8 Respiratory Rate 19 (breaths/min): Photos: [1:No Photos] [N/A:N/A] Wound Location: [1:Right Foot - Plantar, Medial] [N/A:N/A] Wounding Event: [1:Pressure Injury] [N/A:N/A] Primary Etiology: [1:Pressure Ulcer] [N/A:N/A] Comorbid History: [1:Anemia, Asthma, History N/A of pressure wounds, Osteomyelitis, Neuropathy] Date Acquired: [1:03/24/2015] [N/A:N/A] Weeks of Treatment: [1:2] [N/A:N/A] Wound Status: [1:Open] [N/A:N/A] Measurements L x W x D 1x0.6x0.1 [N/A:N/A] (cm) Area (cm) : [1:0.471] [N/A:N/A] Volume (cm) : [1:0.047] [N/A:N/A] % Reduction in Area: [1:57.20%] [N/A:N/A] % Reduction in Volume: 92.90% [N/A:N/A] Classification: [1:Category/Stage II] [N/A:N/A] Exudate Amount: [1:Medium] [N/A:N/A] Exudate Type: [1:Serosanguineous] [N/A:N/A] Exudate Color: [1:red, brown] [N/A:N/A] Wound Margin: [1:Flat and  Intact] [N/A:N/A] Granulation Amount: [1:Large (67-100%)] [N/A:N/A] Necrotic Amount: [1:None Present (0%)] [N/A:N/A] Exposed Structures: [1:Fascia: No Fat: No Tendon: No Muscle: No Joint: No Bone: No] [N/A:N/A] Limited to Skin Breakdown Epithelialization: None N/A N/A Periwound Skin Texture: Edema: No N/A N/A Excoriation: No Induration: No Callus: No Crepitus: No Fluctuance: No Friable: No Rash: No Scarring: No Periwound Skin Moist: Yes N/A N/A Moisture: Maceration: No Dry/Scaly: No Periwound Skin Color: Atrophie Blanche: No N/A N/A Cyanosis: No Ecchymosis: No Erythema: No Hemosiderin Staining: No Mottled: No Pallor: No Rubor: No Tenderness on Yes N/A N/A Palpation: Wound Preparation: Ulcer Cleansing: N/A N/A Rinsed/Irrigated with Saline Topical Anesthetic Applied: Other: lidocaine 4% Treatment Notes Electronic Signature(s) Signed: 06/12/2015 2:43:02 PM By: Regan Lemming BSN, RN Entered By: Regan Lemming on  06/12/2015 10:09:28 [name] ([id number]) -------------------------------------------------------------------------------- [place] Details Patient Name: [name]. Date of Service: 06/12/2015 10:00 AM Medical Record Number: [id number] Patient Account Number: [id number] Date of Birth/Sex: 05-15-1975 (40 y.o. Female) Treating RN: Afful, RN, BSN, Velva Harman Primary Care Physician: Caryl Bis, ERIC Other Clinician: Referring Physician: Caryl Bis, ERIC Treating Physician/Extender: Frann Rider in Treatment: 2 Active Inactive Orientation to the Wound Care Program Nursing Diagnoses: Knowledge deficit related to the wound healing center program Goals: Patient/caregiver will verbalize understanding of the [place] [facility] Date Initiated: 05/26/2015 Goal Status: Active Interventions: Provide education on orientation to the wound center Notes: Osteomyelitis Nursing Diagnoses: Potential for infection: osteomyelitis Goals: Diagnostic evaluation for osteomyelitis completed as ordered Date Initiated: 05/26/2015 Goal Status: Active Patient/caregiver will verbalize understanding of disease process and disease management Date Initiated: 05/26/2015 Goal Status: Active Signs and symptoms for osteomyelitis will be recognized and promptly addressed Date Initiated: 05/26/2015 Goal Status: Active Interventions: Assess for signs and symptoms of osteomyelitis resolution every visit Provide education on osteomyelitis Screen for HBO Notes: Vandeventer, Nazli M. ([id number]) Pressure Nursing Diagnoses: Knowledge deficit related to management of pressures ulcers Goals: Patient will remain free from development of additional pressure ulcers Date Initiated: 05/26/2015 Goal Status: Active Patient/caregiver will verbalize risk factors for pressure ulcer development Date Initiated: 05/26/2015 Goal Status: Active Patient/caregiver will verbalize understanding of  pressure ulcer management Date Initiated: 05/26/2015 Goal Status: Active Interventions: Assess offloading mechanisms upon admission and as needed Notes: Wound/Skin Impairment Nursing Diagnoses: Impaired tissue integrity Goals: Patient will demonstrate a reduced rate of smoking or cessation of smoking Date Initiated: 05/26/2015 Goal Status: Active Patient/caregiver will verbalize understanding of skin care regimen Date Initiated: 05/26/2015 Goal Status: Active Ulcer/skin breakdown will have a volume reduction of 30% by week 4 Date Initiated: 05/26/2015 Goal Status: Active Ulcer/skin breakdown will have a volume reduction of 50% by week 8 Date Initiated: 05/26/2015 Goal Status: Active Ulcer/skin breakdown will have a volume reduction of 80% by week 12 Date Initiated: 05/26/2015 Goal Status: Active Ulcer/skin breakdown will heal within 14 weeks Date Initiated: 05/26/2015 Goal Status: Active [name], [name] ([id number]) Interventions: Assess patient/caregiver ability to obtain necessary supplies Assess patient/caregiver ability to perform ulcer/skin care regimen upon admission and as needed Assess ulceration(s) every visit Provide education on smoking Provide education on ulcer and skin care Screen for HBO Notes: Electronic Signature(s) Signed: 06/12/2015 2:43:02 PM By: Regan Lemming BSN, RN Entered By: Regan Lemming on 06/12/2015 10:09:15 Granzow, Maame M. ([id number]) -------------------------------------------------------------------------------- Pain Assessment Details Patient Name: [name]. Date of Service: 06/12/2015 10:00 AM Medical Record Number: [id number] Patient Account Number: [id number] Date of Birth/Sex: 03/07/1975 (40 y.o. Female) Treating RN: Afful, RN, BSN, Velva Harman Primary Care Physician: Caryl Bis, ERIC  Other Clinician: Referring Physician: Caryl Bis, ERIC Treating Physician/Extender: Frann Rider in Treatment: 2 Active Problems Location of Pain  Severity and Description of Pain Patient Has Paino No Site Locations Pain Management and Medication Current Pain Management: Electronic Signature(s) Signed: 06/12/2015 9:57:12 AM By: Regan Lemming BSN, RN Entered By: Regan Lemming on 06/12/2015 09:57:11 Parisien, Loida Jerilynn Mages ([id number]) -------------------------------------------------------------------------------- Patient/Caregiver Education Details Patient Name: [name]. Date of Service: 06/12/2015 10:00 AM Medical Record Number: [id number] Patient Account Number: [id number] Date of Birth/Gender: [birth year] (40 y.o. Female) Treating RN: Afful, RN, BSN, Velva Harman Primary Care Physician: Caryl Bis, ERIC Other Clinician: Referring Physician: Caryl Bis, ERIC Treating Physician/Extender: Frann Rider in Treatment: 2 Education Assessment Education Provided To: Patient Education Topics Provided Infection: Methods: Explain/Verbal Responses: State content correctly Smoking and Wound Healing: Methods: Explain/Verbal Responses: State content correctly Welcome To [facility]: Methods: Explain/Verbal Responses: State content correctly Wound/Skin Impairment: Methods: Explain/Verbal Responses: State content correctly Electronic Signature(s) Signed: 06/12/2015 2:43:02 PM By: Regan Lemming BSN, RN Entered By: Regan Lemming on 06/12/2015 10:13:08 Metts, Haydee M. ([id number]) -------------------------------------------------------------------------------- Wound Assessment Details Patient Name: [name]. Date of Service: 06/12/2015 10:00 AM Medical Record Number: [id number] Patient Account Number: [id number] Date of Birth/Sex: March 01, 1975 (40 y.o. Female) Treating RN: Afful, RN, BSN, [place] Primary Care Physician: Caryl Bis, ERIC Other Clinician: Referring Physician: Caryl Bis, ERIC Treating Physician/Extender: Frann Rider in Treatment: 2 Wound Status Wound Number: 1 Primary Pressure Ulcer Etiology: Wound  Location: Right Foot - Plantar, Medial Wound Open Wounding Event: Pressure Injury Status: Date Acquired: 03/24/2015 Comorbid Anemia, Asthma, History of pressure Weeks Of Treatment: 2 History: wounds, Osteomyelitis, Neuropathy Clustered Wound: No Photos Photo Uploaded By: Regan Lemming on 06/12/2015 13:28:40 Wound Measurements Length: (cm) 1 Width: (cm) 0.6 Depth: (cm) 0.1 Area: (cm) 0.471 Volume: (cm) 0.047 % Reduction in Area: 57.2% % Reduction in Volume: 92.9% Epithelialization: None Tunneling: No Undermining: Yes Starting Position (o'clock): 12 Ending Position (o'clock): 1 Maximum Distance: (cm) 2.5 Wound Description Classification: Category/Stage II Wound Margin: Flat and Intact Exudate Amount: Medium Exudate Type: Serosanguineous Exudate Color: red, brown Wound Bed Fasig, Riko M. ([id number]) Granulation Amount: Large (67-100%) Exposed Structure Necrotic Amount: None Present (0%) Fascia Exposed: No Fat Layer Exposed: No Tendon Exposed: No Muscle Exposed: No Joint Exposed: No Bone Exposed: No Limited to Skin Breakdown Periwound Skin Texture Texture Color No Abnormalities Noted: No No Abnormalities Noted: No Callus: No Atrophie Blanche: No Crepitus: No Cyanosis: No Excoriation: No Ecchymosis: No Fluctuance: No Erythema: No Friable: No Hemosiderin Staining: No Induration: No Mottled: No Localized Edema: No Pallor: No Rash: No Rubor: No Scarring: No Temperature / Pain Moisture Tenderness on Palpation: Yes No Abnormalities Noted: No Dry / Scaly: No Maceration: No Moist: Yes Wound Preparation Ulcer Cleansing: Rinsed/Irrigated with Saline Topical Anesthetic Applied: Other: lidocaine 4%, Treatment Notes Wound #1 (Right, Medial, Plantar Foot) 1. Cleansed with: Clean wound with Normal Saline 4. Dressing Applied: Iodoform packing Gauze 5. Secondary Dressing Applied Kerlix/Conform 7. Secured with Tape Notes MRI ordered Electronic  Signature(s) Signed: 06/12/2015 2:43:02 PM By: Regan Lemming BSN, RN Wandel, Marium M. ([id number]) Entered By: Regan Lemming on 06/12/2015 10:12:38 [name] ([id number]) -------------------------------------------------------------------------------- Vitals Details Patient Name: [name]. Date of Service: 06/12/2015 10:00 AM Medical Record Number: [id number] Patient Account Number: [id number] Date of Birth/Sex: 25-Jul-1975 (40 y.o. Female) Treating RN: Baruch Gouty, RN, BSN, Velva Harman Primary Care Physician: Caryl Bis, ERIC Other Clinician: Referring Physician: Caryl Bis, ERIC Treating Physician/Extender: Frann Rider in Treatment: 2 Vital Signs Time Taken: 10:02 Temperature (  F): 98.8 Height (in): 67 Pulse (bpm): 76 Weight (lbs): 153 Respiratory Rate (breaths/min): 19 Body Mass Index (BMI): 24 Blood Pressure (mmHg): 121/76 Reference Range: 80 - 120 mg / dl Electronic Signature(s) Signed: 06/12/2015 2:43:02 PM By: Regan Lemming BSN, RN Entered By: Regan Lemming on 06/12/2015 10:02:58

## 2015-06-12 NOTE — Progress Notes (Addendum)
[name] ([id number]) Visit Report for 06/12/2015 Chief Complaint Document Details Patient Name: [name] 06/12/2015 10:00 Date of Service: AM Medical Record [id number] Number: Patient Account Number: [id number] 04-18-1975 (40 y.o. Treating [name]: Baruch Gouty, [name], [name], [name] Date of Birth/Sex: Female) Other Clinician: Primary Care Physician: Caryl Bis, ERIC Treating [name] Referring Physician: Caryl Bis, ERIC Physician/Extender: Suella Grove in Treatment: 2 Information Obtained from: Patient Chief Complaint Patient presents to the wound care center for a consult due non healing wound to the right [name] at the base of her first metatarsal head where she's had a previous right big toe amputation and this has been there for about 2 months. Electronic Signature(s) Signed: 06/12/2015 11:12:47 AM By: Christin Fudge MD, FACS Entered By: Christin Fudge on 06/12/2015 11:12:47 [name] ([id number]) -------------------------------------------------------------------------------- Debridement Details Patient Name: [name]. 06/12/2015 10:00 Date of Service: AM Medical Record [id number] Number: Patient Account Number: [id number] 1975-08-23 (40 y.o. Treating [name]: Baruch Gouty, [name], [name], [name] Date of Birth/Sex: Female) Other Clinician: Primary Care Physician: Caryl Bis, ERIC Treating [name] Referring Physician: Caryl Bis, ERIC Physician/Extender: Suella Grove in Treatment: 2 Debridement Performed for Wound #1 Right,Medial,Plantar [name] Assessment: Performed By: Physician Christin Fudge, MD Debridement: Debridement Pre-procedure Yes Verification/Time Out Taken: Start Time: 10:05 Pain Control: Lidocaine 4% Topical Solution Level: Skin/Subcutaneous Tissue Total Area Debrided (L x 1 (cm) x 0.5 (cm) = 0.5 (cm) W): Tissue and other Non-Viable, Callus, Fibrin/Slough, Subcutaneous material debrided: Instrument: Forceps, Scissors Bleeding: Minimum Hemostasis Achieved:  Pressure End Time: 10:10 Procedural Pain: 0 Post Procedural Pain: 0 Response to Treatment: Procedure was tolerated well Post Debridement Measurements of Total Wound Length: (cm) 1 Stage: Category/Stage II Width: (cm) 1 Depth: (cm) 0.1 Volume: (cm) 0.079 Post Procedure Diagnosis Same as Pre-procedure Electronic Signature(s) Signed: 06/12/2015 11:12:41 AM By: Christin Fudge MD, FACS Signed: 06/12/2015 2:43:02 PM By: Regan Lemming BSN, [name] Entered By: Christin Fudge on 06/12/2015 11:12:41 [name], [name] ([id number]) -------------------------------------------------------------------------------- HPI Details Patient Name: [name]. 06/12/2015 10:00 Date of Service: AM Medical Record [id number] Number: Patient Account Number: [id number] [birth year] (40 y.o. Treating [name]: Afful, [name], [name], [name] Date of Birth/Sex: Female) Other Clinician: Primary Care Physician: Caryl Bis, ERIC Treating [name] Referring Physician: Caryl Bis, ERIC Physician/Extender: Suella Grove in Treatment: 2 History of Present Illness Location: right first metatarsal head plantar aspect Quality: Patient reports experiencing a dull pain to affected area(s). Severity: Patient states wound are getting worse. Duration: Patient has had the wound for > 2 months prior to seeking treatment at the wound center Timing: Pain in wound is Intermittent (comes and goes Context: The wound appeared gradually over time Modifying Factors: Other treatment(s) tried include:pain clinic for her narcotics Associated Signs and Symptoms: Patient reports having difficulty standing for long periods. HPI Description: 40 year old patient sent to [name] by her PCP 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 Her notes were reviewed and there was extensive history of opioid  treatment and management by the [facility] pain clinic from where she has been terminated. X-ray of the right [name] [name] was done on 03/13/2015 and was suggestive of active infection and possible osteomyelitis. her past surgical history includes amputation of the left big toe in 2011 and amputation of the right big toe in 2015. She was sitting the wound clinic at [facility] and we will try and obtain these notes. She is a smoker and  smokes about 15 cigarettes a day. 06/12/2015 - x-ray of the right [name] -- IMPRESSION:1. Soft tissue swelling of the plantar surface of the [name]. No underlying acute abnormality. 2. Amputation deformity again of the right great toe. Old healed bony deformity noted the right second metatarsal. No acute bony abnormality identified. We have not received any reports from the [facility] hospital yet. She continues to smoke at least 10 cigarettes a day Electronic Signature(s) Signed: 06/12/2015 11:13:29 AM By: Christin Fudge MD, FACS Previous Signature: 06/12/2015 10:05:35 AM Version By: Christin Fudge MD, FACS Entered By: Christin Fudge on 06/12/2015 11:13:29 [name] ([id number]) -------------------------------------------------------------------------------- Physical Exam Details Patient Name: [name]. 06/12/2015 10:00 Date of Service: AM Medical Record [id number] Number: Patient Account Number: [id number] [birth year] (40 y.o. Treating [name]: Baruch Gouty, [name], [name], [name] Date of Birth/Sex: Female) Other Clinician: Primary Care Physician: Caryl Bis, ERIC Treating [name] Referring Physician: Caryl Bis, ERIC Physician/Extender: Weeks in Treatment: 2 Constitutional . Pulse regular. Respirations normal and unlabored. Afebrile. . Eyes Nonicteric. Reactive to light. Ears, Nose, Mouth, and Throat Lips, teeth, and gums WNL.Marland Kitchen Moist mucosa without lesions. Neck supple and nontender. No palpable supraclavicular or cervical adenopathy. Normal sized without  goiter. Respiratory WNL. No retractions.. Breath sounds WNL, No rubs, rales, rhonchi, or wheeze.. Cardiovascular Heart rhythm and rate regular, no murmur or gallop.. Pedal Pulses WNL. No clubbing, cyanosis or edema. Lymphatic No adneopathy. No adenopathy. No adenopathy. Musculoskeletal Adexa without tenderness or enlargement.. Digits and nails w/o clubbing, cyanosis, infection, petechiae, ischemia, or inflammatory conditions.. Integumentary (Hair, Skin) No suspicious lesions. No crepitus or fluctuance. No peri-wound warmth or erythema. No masses.Marland Kitchen Psychiatric Judgement and insight Intact.. No evidence of depression, anxiety, or agitation.. Notes there is again significant undermining medially and after I sharply removed the callus and undermining there is a significant tunneling between the 1 and 4:00 position and this is about 2.5 cm going towards the dorsum of the [name] medially. I did not probe bone. Electronic Signature(s) Signed: 06/12/2015 11:14:35 AM By: Christin Fudge MD, FACS Entered By: Christin Fudge on 06/12/2015 11:14:35 [name] ([id number]) -------------------------------------------------------------------------------- Physician Orders Details Patient Name: [name]. 06/12/2015 10:00 Date of Service: AM Medical Record [id number] Number: Patient Account Number: [id number] 08-Sep-1975 (40 y.o. Treating [name]: Afful, [name], [name], [name] Date of Birth/Sex: Female) Other Clinician: Primary Care Physician: Caryl Bis, ERIC Treating Semaj Kham Referring Physician: Caryl Bis, ERIC Physician/Extender: Suella Grove in Treatment: 2 Verbal / Phone Orders: Yes Clinician: Afful, [name], [name], Rita Read Back and Verified: Yes Diagnosis Coding Wound Cleansing Wound #1 Right,Medial,Plantar [name] o Cleanse wound with mild soap and water Primary Wound Dressing Wound #1 Right,Medial,Plantar [name] o Iodoform packing Gauze - 1/4 inch Secondary Dressing o Gauze and  Kerlix/Conform Dressing Change Frequency Wound #1 Right,Medial,Plantar [name] o Change dressing every other day. Follow-up Appointments Wound #1 Right,Medial,Plantar [name] o Return Appointment in 1 week. Off-Loading Wound #1 Right,Medial,Plantar [name] o Other: - darco front off loader Additional Orders / Instructions Wound #1 Right,Medial,Plantar [name] o Stop Smoking Radiology o MRI, lower extremity without contrast Electronic Signature(s) Signed: 06/12/2015 2:43:02 PM By: Regan Lemming BSN, [name] [name], Kelly M. ([id number]) Signed: 06/12/2015 4:02:17 PM By: Christin Fudge MD, FACS Entered By: Regan Lemming on 06/12/2015 10:15:54 [name], [name] ([id number]) -------------------------------------------------------------------------------- Problem List Details Patient Name: [name], [name]. 06/12/2015 10:00 Date of Service: AM Medical Record [id number] Number: Patient Account Number: [id number] [birth year] (40 y.o. 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 06/12/2015 10:00 Date of Service: AM Medical  Record [id number] Number: Patient Account Number: [id number] 1975-12-08 (40 y.o. Treating [name]: Baruch Gouty, [name], [name], [name] Date of Birth/Sex: Female) Other Clinician: Primary Care Physician: Caryl Bis, ERIC Treating Tanner Yeley Referring Physician: Caryl Bis, ERIC Physician/Extender: Suella Grove in Treatment: 2 Subjective Chief Complaint Information obtained from Patient Patient presents to the wound care center for a consult due non healing wound to the right [name] at the base of her first metatarsal head where she's had a previous right big toe amputation and this has been there for about 2 months. History of Present Illness (HPI) The following HPI elements were documented for the patient's wound: Location: right first metatarsal head plantar aspect Quality: Patient reports experiencing a dull pain to affected area(s). Severity: Patient states wound are getting worse. Duration: Patient has had the wound for > 2 months prior to seeking treatment at the wound center Timing: Pain in wound is Intermittent (comes and goes Context: The wound appeared gradually over time Modifying Factors: Other treatment(s) tried include:pain clinic for her narcotics Associated Signs and Symptoms: Patient reports having difficulty standing for long periods. 40 year old patient sent to [name] by her PCP 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 Her notes were reviewed and there was extensive history of opioid treatment and management by the [facility] pain clinic from where she has been terminated. X-ray of the right [name] [name] was done on 03/13/2015 and was suggestive of active infection and possible osteomyelitis. her past surgical history includes amputation of the left big toe in 2011 and amputation of the right big toe in 2015. She was sitting the wound  clinic at [facility] and we will try and obtain these notes. She is a smoker and smokes about 15 cigarettes a day. 06/12/2015 - x-ray of the right [name] -- IMPRESSION:1. Soft tissue swelling of the plantar surface of the [name]. No underlying acute abnormality. 2. Amputation deformity again of the right great toe. Old healed bony deformity noted the right second metatarsal. No acute bony abnormality identified. We have not received any reports from the [facility] hospital yet. She continues to smoke at least 10 cigarettes a day [name], Kelly M. ([id number]) Objective Constitutional Pulse regular. Respirations normal and unlabored. Afebrile. Vitals Time Taken: 10:02 AM, Height: 67 in, Weight: 153 lbs, BMI: 24, Temperature: 98.8 F, Pulse: 76 bpm, Respiratory Rate: 19 breaths/min, Blood Pressure: 121/76 mmHg. Eyes Nonicteric. Reactive to light. Ears, Nose, Mouth, and Throat Lips, teeth, and gums WNL.Marland Kitchen Moist mucosa without lesions. Neck supple and nontender. No palpable supraclavicular or cervical adenopathy. Normal sized without goiter. Respiratory WNL. No retractions.. Breath sounds WNL, No rubs, rales, rhonchi, or wheeze.. Cardiovascular Heart rhythm and rate regular, no murmur or gallop.. Pedal Pulses WNL. No clubbing, cyanosis or edema. Lymphatic No adneopathy. No adenopathy. No adenopathy.  Musculoskeletal Adexa without tenderness or enlargement.. Digits and nails w/o clubbing, cyanosis, infection, petechiae, ischemia, or inflammatory conditions.Marland Kitchen Psychiatric Judgement and insight Intact.. No evidence of depression, anxiety, or agitation.. General Notes: there is again significant undermining medially and after I sharply removed the callus and undermining there is a significant tunneling between the 1 and 4:00 position and this is about 2.5 cm going towards the dorsum of the [name] medially. I did not probe bone. Integumentary (Hair, Skin) No suspicious lesions. No crepitus or fluctuance.  No peri-wound warmth or erythema. No masses.. Wound #1 status is Open. Original cause of wound was Pressure Injury. The wound is located on the Tok. The wound measures 1cm length x 0.6cm width x 0.1cm depth; 0.471cm^2 area and 0.047cm^3 volume. The wound is limited to skin breakdown. There is no tunneling noted, however, there is undermining starting at 12:00 and ending at 1:00 with a maximum distance of 2.5cm. There is a medium amount of serosanguineous drainage noted. The wound margin is flat and intact. There is large Wescoat, Kelly M. ([id number]) (67-100%) granulation within the wound bed. There is no necrotic tissue within the wound bed. The periwound skin appearance exhibited: Moist. The periwound skin appearance did not exhibit: Callus, Crepitus, Excoriation, Fluctuance, Friable, Induration, Localized Edema, Rash, Scarring, Dry/Scaly, Maceration, Atrophie Blanche, Cyanosis, Ecchymosis, Hemosiderin Staining, Mottled, Pallor, Rubor, Erythema. The periwound has tenderness on palpation. 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 There was a Skin/Subcutaneous Tissue Debridement [id number]) debridement with total area of 0.5 sq cm performed by Christin Fudge, MD. with the following instrument(s): Forceps and Scissors to remove Non-Viable tissue/material including Fibrin/Slough, Callus, and Subcutaneous after achieving pain control using Lidocaine 4% Topical Solution. A time out was conducted prior to the start of the procedure. A Minimum amount of bleeding was controlled with Pressure. The procedure was tolerated well with a pain level of 0 throughout and a pain  level of 0 following the procedure. Post Debridement Measurements: 1cm length x 1cm width x 0.1cm depth; 0.079cm^3 volume. Post debridement Stage noted as Category/Stage II. Post procedure Diagnosis Wound #1: Same as Pre-Procedure Plan Wound Cleansing: Wound #1 Right,Medial,Plantar [name]: Cleanse wound with mild soap and water Primary Wound Dressing: Wound #1 Right,Medial,Plantar [name]: [name], Kelly M. ([id number]) Iodoform packing Gauze - 1/4 inch Secondary Dressing: Gauze and Kerlix/Conform Dressing Change Frequency: Wound #1 Right,Medial,Plantar [name]: Change dressing every other day. Follow-up Appointments: Wound #1 Right,Medial,Plantar [name]: Return Appointment in 1 week. Off-Loading: Wound #1 Right,Medial,Plantar [name]: Other: - darco front off loader Additional Orders / Instructions: Wound #1 Right,Medial,Plantar [name]: Stop Smoking Radiology ordered were: MRI, lower extremity without contrast I have recommended: 1. Dressing changes with packing the wound with 1/4 inch iodoform gauze specially in the tract medially and offloading with Darco front offloading shoe. She should also bring her healing boot which she has at home and we will review this before during her another one. 2. X-ray of the right [name] -- though there is no evidence of osteomyelitis on the plain film I have recommended further testings with an MRI to look for osteomyelitis or myositis which is deep 3. Arterial duplex studies of both lower extremities -- appointment is in June 4. Stopping smoking completely and have discussed this  in great detail and spent over 3 minutes discussing the risks benefits alternatives and all the possible methods 5. Review of all her old notes and x-ray reports from her previous wound care -- we will again make an attempt to retrieve these from [facility] She has had all questions answered and will be regular and compliant Electronic Signature(s) Signed: 06/12/2015 11:16:27 AM By:  Christin Fudge MD, FACS Entered By: Christin Fudge on 06/12/2015 11:16:27 [name], Kelly M. ([id number]) -------------------------------------------------------------------------------- SuperBill Details Patient Name: [name]. Date of Service: 06/12/2015 Medical Record Patient Account Number: [id number] Number: Afful, [name], [name], Treating [name]: [birth year] (40 y.o. 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 L84 Corns and callosities G71.0 Muscular dystrophy F17.218 Nicotine dependence, cigarettes, with other nicotine-induced disorders Facility Procedures CPT4 Code Description: [id number] - DEB SUBQ TISSUE 20 SQ CM/< ICD-10 Description Diagnosis L97.512 Non-pressure chronic ulcer of other part of right fo L84 Corns and callosities G71.0 Muscular dystrophy Modifier: ot with fat la Quantity: 1 yer exposed Physician Procedures CPT4 Code Description: [id number] - WC PHYS LEVEL 3 - EST PT ICD-10 Description Diagnosis L97.512 Non-pressure chronic ulcer of other part of right fo L84 Corns and callosities G71.0 Muscular dystrophy F17.218 Nicotine dependence, cigarettes, with  other nicotine Modifier: 25 ot with fat lay -induced disord Quantity: 1 er exposed ers CPT4 Code Description: [id number] - WC PHYS SUBQ TISS 20 SQ CM ICD-10 Description Diagnosis L97.512 Non-pressure chronic ulcer of other part of right fo L84 Corns and callosities G71.0 Muscular dystrophy Kiernan, Izumi M. ([id number]) Modifier: ot with fat lay Quantity: 1 er exposed Electronic Signature(s) Signed: 06/12/2015 11:16:54 AM By: Christin Fudge MD, FACS Entered By: Christin Fudge on 06/12/2015 11:16:54

## 2015-06-19 ENCOUNTER — Encounter: Payer: Medicare Other | Attending: Surgery | Admitting: Surgery

## 2015-06-19 DIAGNOSIS — L97512 Non-pressure chronic ulcer of other part of right foot with fat layer exposed: Secondary | ICD-10-CM | POA: Insufficient documentation

## 2015-06-19 DIAGNOSIS — L02611 Cutaneous abscess of right foot: Secondary | ICD-10-CM | POA: Diagnosis not present

## 2015-06-19 DIAGNOSIS — F17218 Nicotine dependence, cigarettes, with other nicotine-induced disorders: Secondary | ICD-10-CM | POA: Diagnosis not present

## 2015-06-19 DIAGNOSIS — L03115 Cellulitis of right lower limb: Secondary | ICD-10-CM | POA: Insufficient documentation

## 2015-06-19 DIAGNOSIS — L84 Corns and callosities: Secondary | ICD-10-CM | POA: Insufficient documentation

## 2015-06-19 DIAGNOSIS — G710 Muscular dystrophy: Secondary | ICD-10-CM | POA: Diagnosis not present

## 2015-06-19 DIAGNOSIS — L89892 Pressure ulcer of other site, stage 2: Secondary | ICD-10-CM | POA: Diagnosis not present

## 2015-06-19 NOTE — Progress Notes (Addendum)
AUBRIEL, TOZIER ([id number]) Visit Report for 06/19/2015 Chief Complaint Document Details Patient Name: [name], [name]. Date of Service: 06/19/2015 8:00 AM Medical Record Patient Account Number: [id number] Number: Afful, RN, BSN, Treating RN: 08-15-1975 (40 y.o. [name] Date of Birth/Sex: Female) Other Clinician: Primary Care Physician: Caryl Bis, ERIC Treating Avondre Richens Referring Physician: Caryl Bis, ERIC Physician/Extender: Suella Grove in Treatment: 3 Information Obtained from: Patient Chief Complaint Patient presents to the wound care center for a consult due non healing wound to the right foot at the base of her first metatarsal head where she's had a previous right big toe amputation and this has been there for about 2 months. Electronic Signature(s) Signed: 06/19/2015 8:43:34 AM By: Christin Fudge MD, FACS Previous Signature: 06/19/2015 8:42:44 AM Version By: Christin Fudge MD, FACS Entered By: Christin Fudge on 06/19/2015 08:43:34 Chesney, Avacyn M. ([id number]) -------------------------------------------------------------------------------- HPI Details Patient Name: [name]. Date of Service: 06/19/2015 8:00 AM Medical Record Patient Account Number: [id number] Number: Afful, RN, BSN, Treating RN: 1975-03-09 (40 y.o. [name] Date of Birth/Sex: Female) Other Clinician: Primary Care Physician: Caryl Bis, ERIC Treating Christin Fudge Referring Physician: Caryl Bis, ERIC Physician/Extender: Suella Grove in Treatment: 3 History of Present Illness Location: right first metatarsal head plantar aspect Quality: Patient reports experiencing a dull pain to affected area(s). Severity: Patient states wound are getting worse. Duration: Patient has had the wound for > 2 months prior to seeking treatment at the wound center Timing: Pain in wound is Intermittent (comes and goes Context: The wound appeared gradually over time Modifying Factors: Other treatment(s) tried  include:pain clinic for her narcotics Associated Signs and Symptoms: Patient reports having difficulty standing for long periods. HPI Description: 40 year old patient sent to [name] by her PCP 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 Her notes were reviewed and there was extensive history of opioid treatment and management by the [facility] pain clinic from where she has been terminated. X-ray of the right foot foot was done on 03/13/2015 and was suggestive of active infection and possible osteomyelitis. her past surgical history includes amputation of the left big toe in 2011 and amputation of the right big toe in 2015. She was sitting the wound clinic at [facility] and we will try and obtain these notes. She is a smoker and smokes about 15 cigarettes a day. 06/12/2015 - x-ray of the right foot -- IMPRESSION:1. Soft tissue swelling of the plantar surface of the foot. No underlying acute abnormality. 2. Amputation deformity again of the right great toe. Old healed bony deformity noted the right second metatarsal. No acute bony abnormality identified. We have not received any reports from the [facility] hospital yet. She continues to smoke at least 10 cigarettes a day 06/19/2015 -- she has increasing pain in the right foot and also has had some low-grade fever. Her vascular test is scheduled for tomorrow and her MRIs not till next week. Electronic Signature(s) Signed: 06/19/2015 8:44:07 AM By: Christin Fudge MD, FACS Entered By: Christin Fudge on 06/19/2015 08:44:07 [name] ([id number]) -------------------------------------------------------------------------------- Physical Exam Details Patient Name: [name]. Date of Service: 06/19/2015 8:00 AM Medical Record Patient Account Number: [id number] Number: Afful,  RN, BSN, Treating RN: Sep 03, 1975 (40 y.o. [name] Date of Birth/Sex: Female) Other Clinician: Primary Care Physician: Caryl Bis, ERIC Treating Christin Fudge Referring Physician: Caryl Bis, ERIC Physician/Extender: Suella Grove in Treatment: 3 Constitutional .  Pulse regular. Respirations normal and unlabored. Afebrile. . Eyes Nonicteric. Reactive to light. Ears, Nose, Mouth, and Throat Lips, teeth, and gums WNL.Marland Kitchen Moist mucosa without lesions. Neck supple and nontender. No palpable supraclavicular or cervical adenopathy. Normal sized without goiter. Respiratory WNL. No retractions.. Breath sounds WNL, No rubs, rales, rhonchi, or wheeze.. Cardiovascular Heart rhythm and rate regular, no murmur or gallop.. Pedal Pulses WNL. No clubbing, cyanosis or edema. Lymphatic No adneopathy. No adenopathy. No adenopathy. Musculoskeletal Adexa without tenderness or enlargement.. Digits and nails w/o clubbing, cyanosis, infection, petechiae, ischemia, or inflammatory conditions.. Integumentary (Hair, Skin) No suspicious lesions. No crepitus or fluctuance. No peri-wound warmth or erythema. No masses.Marland Kitchen Psychiatric Judgement and insight Intact.. No evidence of depression, anxiety, or agitation.. Notes her right foot has cellulitis, swelling and warmth and I suspect she has a deep-seated abscess and possibly osteomyelitis of the right metatarsal No debridement was done today Electronic Signature(s) Signed: 06/19/2015 8:44:56 AM By: Christin Fudge MD, FACS Entered By: Christin Fudge on 06/19/2015 08:44:56 Vallo, Seda M. ([id number]) -------------------------------------------------------------------------------- Physician Orders Details Patient Name: [name]. Date of Service: 06/19/2015 8:00 AM Medical Record Patient Account Number: [id number] Number: Afful, RN, BSN, Treating RN: 06/30/1975 (40 y.o. [name] Date of Birth/Sex: Female) Other Clinician: Primary Care Physician: Caryl Bis,  ERIC Treating Christin Fudge Referring Physician: Caryl Bis, ERIC Physician/Extender: Suella Grove in Treatment: 3 Verbal / Phone Orders: Yes Clinician: Afful, RN, BSN, Rita Read Back and Verified: Yes Diagnosis Coding Wound Cleansing Wound #1 Right,Medial,Plantar Foot o Cleanse wound with mild soap and water Primary Wound Dressing Wound #1 Right,Medial,Plantar Foot o Iodoform packing Gauze - 1/4 inch Secondary Dressing o Gauze and Kerlix/Conform Dressing Change Frequency Wound #1 Right,Medial,Plantar Foot o Change dressing every other day. Follow-up Appointments Wound #1 Right,Medial,Plantar Foot o Other: - After ER visit...can cal to schedule. Off-Loading Wound #1 Right,Medial,Plantar Foot o Other: - darco front off loader Additional Orders / Instructions Wound #1 Right,Medial,Plantar Foot o Stop Smoking o Activity as tolerated o Other: - Patient to go to ER for further eval. Electronic Signature(s) Signed: 06/19/2015 4:11:18 PM By: Christin Fudge MD, FACS Signed: 06/19/2015 5:34:58 PM By: Regan Lemming BSN, RN Britain, Chelsi M. ([id number]) Entered By: Regan Lemming on 06/19/2015 08:47:32 Bordas, Alissandra M. ([id number]) -------------------------------------------------------------------------------- Problem List Details Patient Name: [name]. Date of Service: 06/19/2015 8:00 AM Medical Record Patient Account Number: [id number] Number: Afful, RN, BSN, Treating RN: January 10, 1976 (40 y.o. [name] Date of Birth/Sex: Female) Other Clinician: Primary Care Physician: Caryl Bis, ERIC Treating Christin Fudge Referring Physician: Caryl Bis, ERIC Physician/Extender: Suella Grove in Treatment: 3 Active Problems ICD-10 Encounter Code Description Active Date Diagnosis L97.512 Non-pressure chronic ulcer of other part of right foot with 05/26/2015 Yes fat layer exposed L84 Corns and callosities 05/26/2015 Yes G71.0 Muscular dystrophy 05/26/2015 Yes F17.218 Nicotine  dependence, cigarettes, with other nicotine- 05/26/2015 Yes induced disorders L02.611 Cutaneous abscess of right foot 06/19/2015 Yes L03.115 Cellulitis of right lower limb 06/19/2015 Yes Inactive Problems Resolved Problems Electronic Signature(s) Signed: 06/19/2015 8:43:22 AM By: Christin Fudge MD, FACS Previous Signature: 06/19/2015 8:42:38 AM Version By: Christin Fudge MD, FACS Entered By: Christin Fudge on 06/19/2015 08:43:22 Millett, Tennelle M. ([id number]) -------------------------------------------------------------------------------- Progress Note Details Patient Name: [name]. Date of Service: 06/19/2015 8:00 AM Medical Record Patient Account Number: [id number] Number: Afful, RN, BSN, Treating RN: Jan 30, 1975 (40 y.o. [name] Date of Birth/Sex: Female) Other Clinician: Primary Care Physician: Caryl Bis, ERIC Treating Christin Fudge Referring Physician: Caryl Bis, ERIC Physician/Extender: Suella Grove in Treatment: 3 Subjective Chief Complaint Information  obtained from Patient Patient presents to the wound care center for a consult due non healing wound to the right foot at the base of her first metatarsal head where she's had a previous right big toe amputation and this has been there for about 2 months. History of Present Illness (HPI) The following HPI elements were documented for the patient's wound: Location: right first metatarsal head plantar aspect Quality: Patient reports experiencing a dull pain to affected area(s). Severity: Patient states wound are getting worse. Duration: Patient has had the wound for > 2 months prior to seeking treatment at the wound center Timing: Pain in wound is Intermittent (comes and goes Context: The wound appeared gradually over time Modifying Factors: Other treatment(s) tried include:pain clinic for her narcotics Associated Signs and Symptoms: Patient reports having difficulty standing for long periods. 40 year old patient sent to [name] by  her PCP 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 Her notes were reviewed and there was extensive history of opioid treatment and management by the [facility] pain clinic from where she has been terminated. X-ray of the right foot foot was done on 03/13/2015 and was suggestive of active infection and possible osteomyelitis. her past surgical history includes amputation of the left big toe in 2011 and amputation of the right big toe in 2015. She was sitting the wound clinic at [facility] and we will try and obtain these notes. She is a smoker and smokes about 15 cigarettes a day. 06/12/2015 - x-ray of the right foot -- IMPRESSION:1. Soft tissue swelling of the plantar surface of the foot. No underlying acute abnormality. 2. Amputation deformity again of the right great toe. Old healed bony deformity noted the right second metatarsal. No acute bony abnormality identified. We have not received any reports from the [facility] hospital yet. She continues to smoke at least 10 cigarettes a day 06/19/2015 -- she has increasing pain in the right foot and also has had some low-grade fever. Her vascular test is scheduled for tomorrow and her MRIs not till next week. MELI, LOURO. ([id number]) Objective Constitutional Pulse regular. Respirations normal and unlabored. Afebrile. Vitals Time Taken: 8:22 AM, Height: 67 in, Weight: 153 lbs, BMI: 24, Temperature: 99.7 F, Pulse: 83 bpm, Respiratory Rate: 18 breaths/min, Blood Pressure: 111/71 mmHg. Eyes Nonicteric. Reactive to light. Ears, Nose, Mouth, and Throat Lips, teeth, and gums WNL.Marland Kitchen Moist mucosa without lesions. Neck supple and nontender. No palpable supraclavicular or cervical adenopathy. Normal sized without goiter. Respiratory WNL. No retractions.. Breath sounds WNL, No rubs, rales,  rhonchi, or wheeze.. Cardiovascular Heart rhythm and rate regular, no murmur or gallop.. Pedal Pulses WNL. No clubbing, cyanosis or edema. Lymphatic No adneopathy. No adenopathy. No adenopathy. Musculoskeletal Adexa without tenderness or enlargement.. Digits and nails w/o clubbing, cyanosis, infection, petechiae, ischemia, or inflammatory conditions.Marland Kitchen Psychiatric Judgement and insight Intact.. No evidence of depression, anxiety, or agitation.. General Notes: her right foot has cellulitis, swelling and warmth and I suspect she has a deep-seated abscess and possibly osteomyelitis of the right metatarsal No debridement was done today Integumentary (Hair, Skin) No suspicious lesions. No crepitus or fluctuance. No peri-wound warmth or erythema. No masses.. Wound #1 status is Open. Original cause of wound was Pressure Injury. The wound is located on the [place]. The wound measures 1.2cm length x 1cm width x 1.3cm depth; 0.942cm^2 area and  1.225cm^3 volume. The wound is limited to skin breakdown. There is no undermining noted, however, Kastens, Denetra M. ([id number]) there is tunneling at 12:00 with a maximum distance of 1.2cm. There is a medium amount of serosanguineous drainage noted. The wound margin is flat and intact. There is large (67-100%) granulation within the wound bed. There is no necrotic tissue within the wound bed. The periwound skin appearance exhibited: Callus, Moist. The periwound skin appearance did not exhibit: Crepitus, Excoriation, Fluctuance, Friable, Induration, Localized Edema, Rash, Scarring, Dry/Scaly, Maceration, Atrophie Blanche, Cyanosis, Ecchymosis, Hemosiderin Staining, Mottled, Pallor, Rubor, Erythema. The periwound has tenderness on palpation. Assessment Active Problems ICD-10 L97.512 - Non-pressure chronic ulcer of other part of right foot with fat layer exposed L84 - Corns and callosities G71.0 - Muscular dystrophy F17.218 - Nicotine  dependence, cigarettes, with other nicotine-induced disorders L02.611 - Cutaneous abscess of right foot L03.115 - Cellulitis of right lower limb The patient's right foot shows evidence of cellulitis and possibly a deep-seated abscess. I have recommended an urgent visit to the ER for further workup and appropriate surgery if indicated. She prefers to go back to [facility] where she's had all her previous surgery and I have asked her to get the ER as soon as possible. I would be happy to talk to the ER physician also send him my notes if requested. She understands the treatment plan and says she will be compliant. Plan Wound Cleansing: Wound #1 Right,Medial,Plantar Foot: Cleanse wound with mild soap and water Primary Wound Dressing: Wound #1 Right,Medial,Plantar Foot: Iodoform packing Gauze - 1/4 inch Secondary Dressing: Gauze and Kerlix/Conform Speir, Emmerie M. ([id number]) Dressing Change Frequency: Wound #1 Right,Medial,Plantar Foot: Change dressing every other day. Follow-up Appointments: Wound #1 Right,Medial,Plantar Foot: Other: - After ER visit...can cal to schedule. Off-Loading: Wound #1 Right,Medial,Plantar Foot: Other: - darco front off loader Additional Orders / Instructions: Wound #1 Right,Medial,Plantar Foot: Stop Smoking Activity as tolerated Other: - Patient to go to ER for further eval. The patient's right foot shows evidence of cellulitis and possibly a deep-seated abscess. I have recommended an urgent visit to the ER for further workup and appropriate surgery if indicated. She prefers to go back to [facility] where she's had all her previous surgery and I have asked her to get the ER as soon as possible. I would be happy to talk to the ER physician also send him my notes if requested. She understands the treatment plan and says she will be compliant. Electronic Signature(s) Signed: 06/19/2015 4:12:05 PM By: Christin Fudge MD, FACS Previous Signature: 06/19/2015 8:46:48 AM Version  By: Christin Fudge MD, FACS Entered By: Christin Fudge on 06/19/2015 16:12:05 [name] ([id number]) -------------------------------------------------------------------------------- SuperBill Details Patient Name: [name]. Date of Service: 06/19/2015 Medical Record Patient Account Number: [id number] Number: Afful, RN, BSN, Treating RN: [birth year] (40 y.o. [name] Date of Birth/Sex: Female) Other Clinician: Primary Care Physician: Caryl Bis, ERIC Treating Aliesha Dolata Referring Physician: Caryl Bis, ERIC Physician/Extender: Suella Grove in Treatment: 3 Diagnosis Coding ICD-10 Codes Code Description L97.512 Non-pressure chronic ulcer of other part of right foot with fat layer exposed L84 Corns and callosities G71.0 Muscular dystrophy F17.218 Nicotine dependence, cigarettes, with other nicotine-induced disorders L02.611 Cutaneous abscess of right foot L03.115 Cellulitis of right lower limb Physician Procedures CPT4 Code Description: [id number] - WC PHYS LEVEL 4 - EST PT ICD-10 Description Diagnosis L97.512 Non-pressure chronic ulcer of other part of right fo L84 Corns and callosities L03.115 Cellulitis of right lower limb L02.611 Cutaneous abscess of right  foot Modifier: ot with fat lay Quantity: 1 er exposed Electronic Signature(s) Signed: 06/19/2015 8:47:12 AM By: Christin Fudge MD, FACS Entered By: Christin Fudge on 06/19/2015 08:47:12

## 2015-06-20 DIAGNOSIS — M869 Osteomyelitis, unspecified: Secondary | ICD-10-CM | POA: Diagnosis not present

## 2015-06-20 DIAGNOSIS — Z720 Tobacco use: Secondary | ICD-10-CM | POA: Diagnosis not present

## 2015-06-20 DIAGNOSIS — R509 Fever, unspecified: Secondary | ICD-10-CM | POA: Diagnosis not present

## 2015-06-20 DIAGNOSIS — M7989 Other specified soft tissue disorders: Secondary | ICD-10-CM | POA: Diagnosis not present

## 2015-06-20 DIAGNOSIS — M86171 Other acute osteomyelitis, right ankle and foot: Secondary | ICD-10-CM | POA: Diagnosis present

## 2015-06-20 DIAGNOSIS — B9561 Methicillin susceptible Staphylococcus aureus infection as the cause of diseases classified elsewhere: Secondary | ICD-10-CM | POA: Diagnosis present

## 2015-06-20 DIAGNOSIS — L97519 Non-pressure chronic ulcer of other part of right foot with unspecified severity: Secondary | ICD-10-CM | POA: Diagnosis not present

## 2015-06-20 DIAGNOSIS — L02611 Cutaneous abscess of right foot: Secondary | ICD-10-CM | POA: Diagnosis not present

## 2015-06-20 DIAGNOSIS — G600 Hereditary motor and sensory neuropathy: Secondary | ICD-10-CM | POA: Diagnosis not present

## 2015-06-20 DIAGNOSIS — F17210 Nicotine dependence, cigarettes, uncomplicated: Secondary | ICD-10-CM | POA: Diagnosis not present

## 2015-06-20 DIAGNOSIS — Z880 Allergy status to penicillin: Secondary | ICD-10-CM | POA: Diagnosis not present

## 2015-06-20 DIAGNOSIS — L02619 Cutaneous abscess of unspecified foot: Secondary | ICD-10-CM | POA: Diagnosis not present

## 2015-06-20 DIAGNOSIS — Z89411 Acquired absence of right great toe: Secondary | ICD-10-CM | POA: Diagnosis not present

## 2015-06-20 DIAGNOSIS — R9431 Abnormal electrocardiogram [ECG] [EKG]: Secondary | ICD-10-CM | POA: Diagnosis not present

## 2015-06-20 DIAGNOSIS — R110 Nausea: Secondary | ICD-10-CM | POA: Diagnosis not present

## 2015-06-20 DIAGNOSIS — M25471 Effusion, right ankle: Secondary | ICD-10-CM | POA: Diagnosis not present

## 2015-06-20 DIAGNOSIS — S92321A Displaced fracture of second metatarsal bone, right foot, initial encounter for closed fracture: Secondary | ICD-10-CM | POA: Diagnosis not present

## 2015-06-20 DIAGNOSIS — Z89412 Acquired absence of left great toe: Secondary | ICD-10-CM | POA: Diagnosis not present

## 2015-06-20 DIAGNOSIS — I4510 Unspecified right bundle-branch block: Secondary | ICD-10-CM | POA: Diagnosis not present

## 2015-06-20 DIAGNOSIS — G8918 Other acute postprocedural pain: Secondary | ICD-10-CM | POA: Diagnosis not present

## 2015-06-20 DIAGNOSIS — L97512 Non-pressure chronic ulcer of other part of right foot with fat layer exposed: Secondary | ICD-10-CM | POA: Diagnosis not present

## 2015-06-20 DIAGNOSIS — G710 Muscular dystrophy: Secondary | ICD-10-CM | POA: Diagnosis not present

## 2015-06-20 DIAGNOSIS — G8929 Other chronic pain: Secondary | ICD-10-CM | POA: Diagnosis not present

## 2015-06-20 DIAGNOSIS — S91301A Unspecified open wound, right foot, initial encounter: Secondary | ICD-10-CM | POA: Diagnosis not present

## 2015-06-20 DIAGNOSIS — M009 Pyogenic arthritis, unspecified: Secondary | ICD-10-CM | POA: Diagnosis present

## 2015-06-20 DIAGNOSIS — M868X7 Other osteomyelitis, ankle and foot: Secondary | ICD-10-CM | POA: Diagnosis not present

## 2015-06-20 DIAGNOSIS — L03115 Cellulitis of right lower limb: Secondary | ICD-10-CM | POA: Diagnosis not present

## 2015-06-20 DIAGNOSIS — L03119 Cellulitis of unspecified part of limb: Secondary | ICD-10-CM | POA: Diagnosis not present

## 2015-06-20 NOTE — Progress Notes (Addendum)
[name], [name] ([id number]) Visit Report for 06/19/2015 Arrival Information Details Patient Name: [name], [name]. Date of Service: 06/19/2015 8:00 AM Medical Record Number: [id number] Patient Account Number: [id number] Date of Birth/Sex: [birth year] (40 y.o. Female) Treating RN: Afful, RN, BSN, Velva Harman Primary Care Physician: Caryl Bis, ERIC Other Clinician: Referring Physician: Caryl Bis, ERIC Treating Physician/Extender: Frann Rider in Treatment: 3 Visit Information History Since Last Visit Added or deleted any medications: No Patient Arrived: Ambulatory Any new allergies or adverse reactions: No Arrival Time: 08:14 Had a fall or experienced change in No Accompanied By: self activities of daily living that may affect Transfer Assistance: None risk of falls: Patient Identification Verified: Yes Signs or symptoms of abuse/neglect since last No Secondary Verification Process Yes visito Completed: Hospitalized since last visit: No Patient Requires Transmission-Based No Has Dressing in Place as Prescribed: Yes Precautions: Has Footwear/Offloading in Place as Yes Patient Has Alerts: No Prescribed: Right: Wedge Shoe Pain Present Now: No Electronic Signature(s) Signed: 06/19/2015 5:34:58 PM By: Regan Lemming BSN, RN Entered By: Regan Lemming on 06/19/2015 08:14:50 [name], Kelly M. ([id number]) -------------------------------------------------------------------------------- Clinic Level of Care Assessment Details Patient Name: [name]. Date of Service: 06/19/2015 8:00 AM Medical Record Number: [id number] Patient Account Number: [id number] Date of Birth/Sex: Jul 03, 1975 (40 y.o. Female) Treating RN: Afful, RN, BSN, [place] Primary Care Physician: Caryl Bis, ERIC Other Clinician: Referring Physician: Caryl Bis, ERIC Treating Physician/Extender: Frann Rider in Treatment: 3 Clinic Level of Care Assessment Items TOOL 4 Quantity Score []  - Use when only an  EandM is performed on FOLLOW-UP visit 0 ASSESSMENTS - Nursing Assessment / Reassessment X - Reassessment of Co-morbidities (includes updates in patient status) 1 10 X - Reassessment of Adherence to Treatment Plan 1 5 ASSESSMENTS - Wound and Skin Assessment / Reassessment X - Simple Wound Assessment / Reassessment - one wound 1 5 []  - Complex Wound Assessment / Reassessment - multiple wounds 0 []  - Dermatologic / Skin Assessment (not related to wound area) 0 ASSESSMENTS - Focused Assessment []  - Circumferential Edema Measurements - multi extremities 0 []  - Nutritional Assessment / Counseling / Intervention 0 []  - Lower Extremity Assessment (monofilament, tuning fork, pulses) 0 []  - Peripheral Arterial Disease Assessment (using hand held doppler) 0 ASSESSMENTS - Ostomy and/or Continence Assessment and Care []  - Incontinence Assessment and Management 0 []  - Ostomy Care Assessment and Management (repouching, etc.) 0 PROCESS - Coordination of Care []  - Simple Patient / Family Education for ongoing care 0 []  - Complex (extensive) Patient / Family Education for ongoing care 0 []  - Staff obtains Programmer, systems, Records, Test Results / Process Orders 0 []  - Staff telephones HHA, Nursing Homes / Clarify orders / etc 0 []  - Routine Transfer to another Facility (non-emergent condition) 0 [name], Kelly M. ([id number]) []  - Routine Hospital Admission (non-emergent condition) 0 []  - New Admissions / Biomedical engineer / Ordering NPWT, Apligraf, etc. 0 []  - Emergency Hospital Admission (emergent condition) 0 []  - Simple Discharge Coordination 0 []  - Complex (extensive) Discharge Coordination 0 PROCESS - Special Needs []  - Pediatric / Minor Patient Management 0 []  - Isolation Patient Management 0 []  - Hearing / Language / Visual special needs 0 []  - Assessment of Community assistance (transportation, D/C planning, etc.) 0 []  - Additional assistance / Altered mentation 0 []  - Support Surface(s)  Assessment (bed, cushion, seat, etc.) 0 INTERVENTIONS - Wound Cleansing / Measurement X - Simple Wound Cleansing - one wound 1 5 []  - Complex Wound Cleansing - multiple wounds 0 X -  Wound Imaging (photographs - any number of wounds) 1 5 []  - Wound Tracing (instead of photographs) 0 X - Simple Wound Measurement - one wound 1 5 []  - Complex Wound Measurement - multiple wounds 0 INTERVENTIONS - Wound Dressings X - Small Wound Dressing one or multiple wounds 1 10 []  - Medium Wound Dressing one or multiple wounds 0 []  - Large Wound Dressing one or multiple wounds 0 []  - Application of Medications - topical 0 []  - Application of Medications - injection 0 INTERVENTIONS - Miscellaneous []  - External ear exam 0 [name], Kelly M. ([id number]) []  - Specimen Collection (cultures, biopsies, blood, body fluids, etc.) 0 []  - Specimen(s) / Culture(s) sent or taken to Lab for analysis 0 []  - Patient Transfer (multiple staff / Harrel Lemon Lift / Similar devices) 0 []  - Simple Staple / Suture removal (25 or less) 0 []  - Complex Staple / Suture removal (26 or more) 0 []  - Hypo / Hyperglycemic Management (close monitor of Blood Glucose) 0 []  - Ankle / Brachial Index (ABI) - do not check if billed separately 0 X - Vital Signs 1 5 Has the patient been seen at the hospital within the last three years: Yes Total Score: 50 Level Of Care: New/Established - Level 2 Electronic Signature(s) Signed: 06/20/2015 3:49:00 PM By: Regan Lemming BSN, RN Entered By: Regan Lemming on 06/20/2015 15:49:00 [name], [name] ([id number]) -------------------------------------------------------------------------------- Encounter Discharge Information Details Patient Name: [name]. Date of Service: 06/19/2015 8:00 AM Medical Record Number: [id number] Patient Account Number: [id number] Date of Birth/Sex: 05-Nov-1975 (40 y.o. Female) Treating RN: Afful, RN, BSN, Velva Harman Primary Care Physician: Caryl Bis, ERIC Other  Clinician: Referring Physician: Caryl Bis, ERIC Treating Physician/Extender: Frann Rider in Treatment: 3 Encounter Discharge Information Items Discharge Pain Level: 0 Discharge Condition: Stable Ambulatory Status: Ambulatory Discharge Destination: Home Transportation: Private Auto Accompanied By: self Schedule Follow-up Appointment: No Medication Reconciliation completed and provided to Patient/Care No Jamareon Shimel: Provided on Clinical Summary of Care: 06/19/2015 Form Type Recipient Paper Patient [place] Electronic Signature(s) Signed: 06/19/2015 5:34:58 PM By: Regan Lemming BSN, RN Previous Signature: 06/19/2015 8:48:07 AM Version By: Ruthine Dose Entered By: Regan Lemming on 06/19/2015 08:48:35 [name], Kelly M. ([id number]) -------------------------------------------------------------------------------- Lower Extremity Assessment Details Patient Name: [name]. Date of Service: 06/19/2015 8:00 AM Medical Record Number: [id number] Patient Account Number: [id number] Date of Birth/Sex: 02/04/1975 (40 y.o. Female) Treating RN: Afful, RN, BSN, Velva Harman Primary Care Physician: Caryl Bis, ERIC Other Clinician: Referring Physician: Caryl Bis, ERIC Treating Physician/Extender: Frann Rider in Treatment: 3 Vascular Assessment Pulses: Posterior Tibial Dorsalis Pedis Palpable: [Right:Yes] Extremity colors, hair growth, and conditions: Extremity Color: [Right:Normal] Hair Growth on Extremity: [Right:No] Temperature of Extremity: [Right:Warm] Capillary Refill: [Right:< 3 seconds] Electronic Signature(s) Signed: 06/19/2015 5:34:58 PM By: Regan Lemming BSN, RN Entered By: Regan Lemming on 06/19/2015 08:21:30 [name], Kelly M. ([id number]) -------------------------------------------------------------------------------- Multi Wound Chart Details Patient Name: [name]. Date of Service: 06/19/2015 8:00 AM Medical Record Number: [id number] Patient Account Number:  [id number] Date of Birth/Sex: 09-Nov-1975 (40 y.o. Female) Treating RN: Baruch Gouty, RN, BSN, Velva Harman Primary Care Physician: Caryl Bis, ERIC Other Clinician: Referring Physician: Caryl Bis, ERIC Treating Physician/Extender: Frann Rider in Treatment: 3 Vital Signs Height(in): 67 Pulse(bpm): 83 Weight(lbs): 153 Blood Pressure 111/71 (mmHg): Body Mass Index(BMI): 24 Temperature(F): 99.7 Respiratory Rate 18 (breaths/min): Photos: [1:No Photos] [N/A:N/A] Wound Location: [1:Right Foot - Plantar, Medial] [N/A:N/A] Wounding Event: [1:Pressure Injury] [N/A:N/A] Primary Etiology: [1:Pressure Ulcer] [N/A:N/A] Comorbid History: [1:Anemia, Asthma, History N/A of pressure wounds, Osteomyelitis, Neuropathy] Date Acquired: [  1:03/24/2015] [N/A:N/A] Weeks of Treatment: [1:3] [N/A:N/A] Wound Status: [1:Open] [N/A:N/A] Measurements L x W x D 1.2x1x1.3 [N/A:N/A] (cm) Area (cm) : [1:0.942] [N/A:N/A] Volume (cm) : [1:1.225] [N/A:N/A] % Reduction in Area: [1:14.40%] [N/A:N/A] % Reduction in Volume: -85.60% [N/A:N/A] Position 1 (o'clock): 12 Maximum Distance 1 1.2 (cm): Tunneling: [1:Yes] [N/A:N/A] Classification: [1:Category/Stage II] [N/A:N/A] Exudate Amount: [1:Medium] [N/A:N/A] Exudate Type: [1:Serosanguineous] [N/A:N/A] Exudate Color: [1:red, brown] [N/A:N/A] Wound Margin: [1:Flat and Intact] [N/A:N/A] Granulation Amount: [1:Large (67-100%)] [N/A:N/A] Necrotic Amount: [1:None Present (0%)] [N/A:N/A] Exposed Structures: [1:Fascia: No Fat: No] [N/A:N/A] Tendon: No Muscle: No Joint: No Bone: No Limited to Skin Breakdown Epithelialization: None N/A N/A Periwound Skin Texture: Callus: Yes N/A N/A Edema: No Excoriation: No Induration: No Crepitus: No Fluctuance: No Friable: No Rash: No Scarring: No Periwound Skin Moist: Yes N/A N/A Moisture: Maceration: No Dry/Scaly: No Periwound Skin Color: Atrophie Blanche: No N/A N/A Cyanosis: No Ecchymosis: No Erythema:  No Hemosiderin Staining: No Mottled: No Pallor: No Rubor: No Tenderness on Yes N/A N/A Palpation: Wound Preparation: Ulcer Cleansing: N/A N/A Rinsed/Irrigated with Saline Topical Anesthetic Applied: Other: lidocaine 4% Treatment Notes Electronic Signature(s) Signed: 06/19/2015 5:34:58 PM By: Regan Lemming BSN, RN Entered By: Regan Lemming on 06/19/2015 08:33:35 [name] ([id number]) -------------------------------------------------------------------------------- [place] Details Patient Name: [name]. Date of Service: 06/19/2015 8:00 AM Medical Record Number: [id number] Patient Account Number: [id number] Date of Birth/Sex: 10/30/1975 (40 y.o. Female) Treating RN: Baruch Gouty, RN, BSN, Velva Harman Primary Care Physician: Caryl Bis, ERIC Other Clinician: Referring Physician: Caryl Bis, ERIC Treating Physician/Extender: Frann Rider in Treatment: 3 Active Inactive Electronic Signature(s) Signed: 09/16/2015 1:42:50 PM By: Gretta Cool RN, BSN, Kim RN, BSN Signed: 09/16/2015 3:27:27 PM By: Regan Lemming BSN, RN Previous Signature: 06/19/2015 5:34:58 PM Version By: Regan Lemming BSN, RN Entered By: Gretta Cool, RN, BSN, Kim on 06/26/2015 15:14:30 [name], [name] ([id number]) -------------------------------------------------------------------------------- Pain Assessment Details Patient Name: ARADIA, TORMEY. Date of Service: 06/19/2015 8:00 AM Medical Record Number: [id number] Patient Account Number: [id number] Date of Birth/Sex: 04-03-1975 (40 y.o. Female) Treating RN: Afful, RN, BSN, Velva Harman Primary Care Physician: Caryl Bis, ERIC Other Clinician: Referring Physician: Caryl Bis, ERIC Treating Physician/Extender: Frann Rider in Treatment: 3 Active Problems Location of Pain Severity and Description of Pain Patient Has Paino No Site Locations With Dressing Change: No Pain Management and Medication Current Pain Management: Electronic Signature(s) Signed:  06/19/2015 5:34:58 PM By: Regan Lemming BSN, RN Entered By: Regan Lemming on 06/19/2015 08:14:59 [name] ([id number]) -------------------------------------------------------------------------------- Patient/Caregiver Education Details Patient Name: [name]. Date of Service: 06/19/2015 8:00 AM Medical Record Number: [id number] Patient Account Number: [id number] Date of Birth/Gender: 11/05/1975 (40 y.o. Female) Treating RN: Afful, RN, BSN, Velva Harman Primary Care Physician: Caryl Bis, ERIC Other Clinician: Referring Physician: Caryl Bis, ERIC Treating Physician/Extender: Frann Rider in Treatment: 3 Education Assessment Education Provided To: Patient Education Topics Provided Infection: Methods: Explain/Verbal Responses: State content correctly Smoking and Wound Healing: Methods: Explain/Verbal Responses: State content correctly Welcome To [facility]: Methods: Explain/Verbal Responses: State content correctly Wound/Skin Impairment: Methods: Explain/Verbal Responses: State content correctly Electronic Signature(s) Signed: 06/19/2015 5:34:58 PM By: Regan Lemming BSN, RN Entered By: Regan Lemming on 06/19/2015 08:48:52 [name], Kelly M. ([id number]) -------------------------------------------------------------------------------- Wound Assessment Details Patient Name: [name]. Date of Service: 06/19/2015 8:00 AM Medical Record Number: [id number] Patient Account Number: [id number] Date of Birth/Sex: [birth year] (40 y.o. Female) Treating RN: Afful, RN, BSN, Velva Harman Primary Care Physician: Caryl Bis, ERIC Other Clinician: Referring Physician: Caryl Bis, ERIC Treating Physician/Extender: Frann Rider in Treatment:  3 Wound Status Wound Number: 1 Primary Pressure Ulcer Etiology: Wound Location: Right Foot - Plantar, Medial Wound Open Wounding Event: Pressure Injury Status: Date Acquired: 03/24/2015 Comorbid Anemia, Asthma, History of  pressure Weeks Of Treatment: 3 History: wounds, Osteomyelitis, Neuropathy Clustered Wound: No Photos Photo Uploaded By: Regan Lemming on 06/19/2015 14:57:27 Wound Measurements Length: (cm) 1.2 Width: (cm) 1 Depth: (cm) 1.3 Area: (cm) 0.942 Volume: (cm) 1.225 % Reduction in Area: 14.4% % Reduction in Volume: -85.6% Epithelialization: None Tunneling: Yes Position (o'clock): 12 Maximum Distance: (cm) 1.2 Undermining: No Wound Description Classification: Category/Stage II Wound Margin: Flat and Intact Exudate Amount: Medium Exudate Type: Serosanguineous Exudate Color: red, brown Wound Bed Granulation Amount: Large (67-100%) Exposed Structure [name], Kelly M. ([id number]) Granulation Quality: Hyper-granulation Fascia Exposed: No Necrotic Amount: None Present (0%) Fat Layer Exposed: No Tendon Exposed: No Muscle Exposed: No Joint Exposed: No Bone Exposed: No Limited to Skin Breakdown Periwound Skin Texture Texture Color No Abnormalities Noted: No No Abnormalities Noted: No Callus: Yes Atrophie Blanche: No Crepitus: No Cyanosis: No Excoriation: No Ecchymosis: No Fluctuance: No Erythema: No Friable: No Hemosiderin Staining: No Induration: No Mottled: No Localized Edema: No Pallor: No Rash: No Rubor: No Scarring: No Temperature / Pain Moisture Tenderness on Palpation: Yes No Abnormalities Noted: No Dry / Scaly: No Maceration: No Moist: Yes Wound Preparation Ulcer Cleansing: Rinsed/Irrigated with Saline Topical Anesthetic Applied: Other: lidocaine 4%, Electronic Signature(s) Signed: 06/19/2015 5:34:58 PM By: Regan Lemming BSN, RN Entered By: Regan Lemming on 06/19/2015 08:23:36 [name], [name] [name] ([id number]) -------------------------------------------------------------------------------- Vitals Details Patient Name: [name]. Date of Service: 06/19/2015 8:00 AM Medical Record Number: [id number] Patient Account Number: [id number] Date of Birth/Sex:  05-27-1975 (40 y.o. Female) Treating RN: Afful, RN, BSN, [place] Primary Care Physician: Caryl Bis, ERIC Other Clinician: Referring Physician: Caryl Bis, ERIC Treating Physician/Extender: Frann Rider in Treatment: 3 Vital Signs Time Taken: 08:22 Temperature (F): 99.7 Height (in): 67 Pulse (bpm): 83 Weight (lbs): 153 Respiratory Rate (breaths/min): 18 Body Mass Index (BMI): 24 Blood Pressure (mmHg): 111/71 Reference Range: 80 - 120 mg / dl Electronic Signature(s) Signed: 06/19/2015 5:34:58 PM By: Regan Lemming BSN, RN Entered By: Regan Lemming on 06/19/2015 08:22:57

## 2015-06-24 ENCOUNTER — Ambulatory Visit: Admission: RE | Admit: 2015-06-24 | Payer: Medicare Other | Source: Ambulatory Visit

## 2015-07-03 ENCOUNTER — Telehealth: Payer: Self-pay | Admitting: *Deleted

## 2015-07-03 ENCOUNTER — Ambulatory Visit: Admitting: Family Medicine

## 2015-07-03 ENCOUNTER — Other Ambulatory Visit: Payer: Self-pay | Admitting: Family Medicine

## 2015-07-03 DIAGNOSIS — M869 Osteomyelitis, unspecified: Secondary | ICD-10-CM

## 2015-07-03 NOTE — Telephone Encounter (Signed)
Patient has a 1130 appt today, she stated that she only wanted to get her labs done, and not see the provider. She currently has a wound vac on her foot,and bandages can not be removed. She has a home health nurse that changes the bandages. Please advise if she should be taken off the providers schedule and placed on the lab schedule.  Pt contact [phone]

## 2015-07-03 NOTE — Telephone Encounter (Signed)
Patient is just coming in for labs tomorrow instead of seeing Dr. Caryl Bis. Please place lab orders

## 2015-07-03 NOTE — Telephone Encounter (Signed)
Tried to call the patient to let her know that she would need to see the doctor today per Dr. Caryl Bis. Phone rang then went to busy signal.

## 2015-07-03 NOTE — Telephone Encounter (Signed)
Please advise, thanks.

## 2015-07-03 NOTE — Telephone Encounter (Signed)
Orders have been placed.

## 2015-07-04 ENCOUNTER — Other Ambulatory Visit

## 2015-07-08 DIAGNOSIS — L98491 Non-pressure chronic ulcer of skin of other sites limited to breakdown of skin: Secondary | ICD-10-CM | POA: Diagnosis not present

## 2015-07-09 ENCOUNTER — Other Ambulatory Visit: Payer: Self-pay | Admitting: Family Medicine

## 2015-07-09 NOTE — Telephone Encounter (Signed)
LM for patient to return call to schedule a follow up appointment.

## 2015-07-09 NOTE — Telephone Encounter (Signed)
Will refill for one month. Patient needs follow-up in the next month.

## 2015-07-09 NOTE — Telephone Encounter (Signed)
Please advise on refill. Patient has not been seen since 02/2015

## 2015-07-30 DIAGNOSIS — Z4802 Encounter for removal of sutures: Secondary | ICD-10-CM | POA: Diagnosis not present

## 2015-07-30 DIAGNOSIS — G8929 Other chronic pain: Secondary | ICD-10-CM | POA: Diagnosis not present

## 2015-07-30 DIAGNOSIS — Z89411 Acquired absence of right great toe: Secondary | ICD-10-CM | POA: Diagnosis not present

## 2015-07-30 DIAGNOSIS — Z720 Tobacco use: Secondary | ICD-10-CM | POA: Diagnosis not present

## 2015-07-30 DIAGNOSIS — L97519 Non-pressure chronic ulcer of other part of right foot with unspecified severity: Secondary | ICD-10-CM | POA: Diagnosis not present

## 2015-07-30 DIAGNOSIS — Z8619 Personal history of other infectious and parasitic diseases: Secondary | ICD-10-CM | POA: Diagnosis not present

## 2015-07-30 DIAGNOSIS — Z8739 Personal history of other diseases of the musculoskeletal system and connective tissue: Secondary | ICD-10-CM | POA: Diagnosis not present

## 2015-07-30 DIAGNOSIS — S92301A Fracture of unspecified metatarsal bone(s), right foot, initial encounter for closed fracture: Secondary | ICD-10-CM | POA: Diagnosis not present

## 2015-07-30 DIAGNOSIS — G600 Hereditary motor and sensory neuropathy: Secondary | ICD-10-CM | POA: Diagnosis not present

## 2015-07-30 DIAGNOSIS — Z872 Personal history of diseases of the skin and subcutaneous tissue: Secondary | ICD-10-CM | POA: Diagnosis not present

## 2015-07-30 DIAGNOSIS — G629 Polyneuropathy, unspecified: Secondary | ICD-10-CM | POA: Diagnosis not present

## 2015-07-31 DIAGNOSIS — M8660 Other chronic osteomyelitis, unspecified site: Secondary | ICD-10-CM | POA: Diagnosis not present

## 2015-07-31 DIAGNOSIS — M869 Osteomyelitis, unspecified: Secondary | ICD-10-CM | POA: Diagnosis not present

## 2015-07-31 DIAGNOSIS — G894 Chronic pain syndrome: Secondary | ICD-10-CM | POA: Diagnosis not present

## 2015-07-31 DIAGNOSIS — M25579 Pain in unspecified ankle and joints of unspecified foot: Secondary | ICD-10-CM | POA: Diagnosis not present

## 2015-07-31 DIAGNOSIS — M79671 Pain in right foot: Secondary | ICD-10-CM | POA: Diagnosis not present

## 2015-07-31 DIAGNOSIS — M791 Myalgia: Secondary | ICD-10-CM | POA: Diagnosis not present

## 2015-08-05 ENCOUNTER — Other Ambulatory Visit: Payer: Self-pay | Admitting: Family Medicine

## 2015-08-05 NOTE — Telephone Encounter (Signed)
Noted. Refill sent in. Will see in office for follow-up.

## 2015-08-05 NOTE — Telephone Encounter (Signed)
I called and scheduled patient to come in 08/07/15 for medication refill

## 2015-08-07 ENCOUNTER — Ambulatory Visit: Admitting: Family Medicine

## 2015-08-19 ENCOUNTER — Other Ambulatory Visit: Payer: Self-pay | Admitting: Family Medicine

## 2015-08-19 NOTE — Telephone Encounter (Signed)
Patient has canceled last several appointments. Need appointment for refills

## 2015-08-20 ENCOUNTER — Other Ambulatory Visit: Payer: Self-pay | Admitting: Family Medicine

## 2015-08-20 NOTE — Telephone Encounter (Signed)
Please advise on refill. Patient has not been seen since 02/2015. I did advise the last time she canceled appointment that we would not be able to refill any medications until she was seen again.

## 2015-08-20 NOTE — Telephone Encounter (Signed)
Patient needs follow-up for further refills. She has not been seen since February and had a recent surgery on her foot. I will not prescribe this medication unless she comes into the office.

## 2015-09-09 ENCOUNTER — Telehealth: Payer: Self-pay | Admitting: Family Medicine

## 2015-09-09 NOTE — Telephone Encounter (Signed)
Can we refill these? Patient has canceled or no showed all visits since February. Per Dr. Caryl Bis message no refills until office visit for follow up

## 2015-09-09 NOTE — Telephone Encounter (Signed)
Pt called and left a vm regarding Rx's. Please advise? Thank you!

## 2015-09-10 NOTE — Telephone Encounter (Signed)
Scheduled patient to come in on 09/17/15 at  8:15 AM. Advised patient how important is is to keep her appointments and that we would not be able to refill medications unless she comes to appointments. Patient verbalized understanding. She has enough medication to last until 09/18/15.

## 2015-09-10 NOTE — Telephone Encounter (Signed)
DR Lacinda Axon has refused these. Dr. Caryl Bis will be back in the clinic Monday. Patient needs a sooner appointment. Has canceled all appointments scheduled since February.

## 2015-09-17 ENCOUNTER — Ambulatory Visit (INDEPENDENT_AMBULATORY_CARE_PROVIDER_SITE_OTHER): Payer: Medicare Other | Admitting: Family Medicine

## 2015-09-17 ENCOUNTER — Encounter: Payer: Self-pay | Admitting: Family Medicine

## 2015-09-17 VITALS — BP 110/68 | HR 89 | Temp 99.0°F | Wt 147.0 lb

## 2015-09-17 DIAGNOSIS — J309 Allergic rhinitis, unspecified: Secondary | ICD-10-CM | POA: Diagnosis not present

## 2015-09-17 DIAGNOSIS — M869 Osteomyelitis, unspecified: Secondary | ICD-10-CM | POA: Diagnosis not present

## 2015-09-17 DIAGNOSIS — F32A Depression, unspecified: Secondary | ICD-10-CM

## 2015-09-17 DIAGNOSIS — F329 Major depressive disorder, single episode, unspecified: Secondary | ICD-10-CM

## 2015-09-17 DIAGNOSIS — Z23 Encounter for immunization: Secondary | ICD-10-CM | POA: Diagnosis not present

## 2015-09-17 DIAGNOSIS — B379 Candidiasis, unspecified: Secondary | ICD-10-CM | POA: Insufficient documentation

## 2015-09-17 DIAGNOSIS — E559 Vitamin D deficiency, unspecified: Secondary | ICD-10-CM | POA: Diagnosis not present

## 2015-09-17 DIAGNOSIS — F419 Anxiety disorder, unspecified: Secondary | ICD-10-CM

## 2015-09-17 DIAGNOSIS — F418 Other specified anxiety disorders: Secondary | ICD-10-CM

## 2015-09-17 LAB — VITAMIN D 25 HYDROXY (VIT D DEFICIENCY, FRACTURES): VITD: 65.89 ng/mL (ref 30.00–100.00)

## 2015-09-17 MED ORDER — FLUCONAZOLE 150 MG PO TABS: 150.0000 mg | ORAL_TABLET | ORAL | 0 refills | Status: DC

## 2015-09-17 MED ORDER — FLUOXETINE HCL 20 MG PO TABS: ORAL_TABLET | ORAL | 3 refills | Status: DC

## 2015-09-17 NOTE — Patient Instructions (Signed)
Nice to see you.  We will treat you for a yeast infection with diflucan. If your symptoms do not improve you need to follow-up. We will check your vitamin d. We will refill your prozac.  Please start using flonase daily and taking zyrtec or claritin for your allergies. Please monitor your foot and if you develop any new symptoms seek medical attention. If you develop abdominal pain, thoughts of hurting herself, shortness of breath, fevers, cough productive of blood, changes to her feet, or any new or changing symptoms please seek medical attention.

## 2015-09-17 NOTE — Assessment & Plan Note (Signed)
Check vitamin D level today 

## 2015-09-17 NOTE — Progress Notes (Signed)
Pre visit review using our clinic review tool, if applicable. No additional management support is needed unless otherwise documented below in the visit note. 

## 2015-09-17 NOTE — Progress Notes (Signed)
[name], MD Phone: [phone]  [name] is a 40 y.o. female who presents today for follow-up.  Yeast infection: Patient notes she had been on an antibiotic for right foot osteomyelitis. She has developed an itchy sensation in her vagina. No vaginal discharge. No dysuria. No abdominal pain. She is sexually active with just her husband. No history of STDs. Has had yeast infections in the past when she's been on antibiotics. She is no longer on antibiotics.   Depression: Patient notes she has her moods. Can get stressed out quite easily. Mostly has anxiety and stress as opposed to depression. Currently on Prozac. Sees a psychiatrist. Has not seen a therapist yet. No SI.  Cough: For a couple of days notes that she feels a little congested in her nose and has rhinorrhea. No postnasal drip. Notes her ears feel full. No shortness of breath. Nonproductive cough. No fevers. Does still smoke. Has not tried any medications for this. Does have a history of allergies.  Right foot osteomyelitis status post amputation: Patient notes the area is healed well. She finished her antibiotics recently. Is following with wound care for this.  Vitamin D deficiency: Notes she stop the 50,000 units of vitamin D supplement 2 weeks ago. He does this rechecked.  PMH: Smoker   ROS see history of present illness  Objective  Physical Exam Vitals:   09/17/15 0816  BP: 110/68  Pulse: 89  Temp: 99 F (37.2 C)    BP Readings from Last 3 Encounters:  09/17/15 110/68  02/26/15 124/78  01/08/15 112/66   Wt Readings from Last 3 Encounters:  09/17/15 147 lb (66.7 kg)  02/26/15 152 lb (68.9 kg)  01/08/15 154 lb 12.8 oz (70.2 kg)    Physical Exam  Constitutional: No distress.  HENT:  Head: Normocephalic.  Mouth/Throat: Oropharynx is clear and moist. No oropharyngeal exudate.  Normal TMs bilaterally  Eyes: Conjunctivae are normal. Pupils are equal, round, and reactive to light.  Neck:  Neck supple.  Cardiovascular: Normal rate, regular rhythm and normal heart sounds.   Pulmonary/Chest: Effort normal and breath sounds normal.  Genitourinary:  Genitourinary Comments: Patient declined pelvic exam  Musculoskeletal:  Right foot with great toe missing with scar along great toe aspect of anterior foot, no erythema or tenderness, 2+ DP pulse on the right side  Lymphadenopathy:    She has no cervical adenopathy.  Neurological: She is alert.  Skin: Skin is warm and dry. She is not diaphoretic.     Assessment/Plan: Please see individual problem list.  Allergic rhinitis Upper respiratory symptoms most consistent with allergic rhinitis. Patient will try Flonase and Claritin.  Osteomyelitis ([place]) Patient with osteomyelitis of her right foot status post amputation part of her distal right foot and antibiotic therapy. Appears to be healing well. Patient no signs of infection. She will continue to follow with the wound Center at [facility]. She's given return precautions.  Anxiety and depression Relatively well controlled. Prozac refilled. She is encouraged to follow up with a therapist and see her psychiatrist.  Yeast infection Patient declined pelvic exam today. Itching potentially related to yeast infection. We will give a trial of Diflucan and see if this is beneficial. If not beneficial she will need to follow-up for pelvic exam. Precautions.  Vitamin D deficiency Check vitamin D level today.   Orders Placed This Encounter  Procedures  . Flu Vaccine QUAD 36+ mos IM  . Vitamin D (25 hydroxy)    Meds ordered this encounter  Medications  . FLUoxetine (PROZAC) 20 MG tablet    Sig: TAKE 2 TABLETS (40 MG TOTAL) BY MOUTH DAILY.    Dispense:  60 tablet    Refill:  3  . fluconazole (DIFLUCAN) 150 MG tablet    Sig: Take 1 tablet (150 mg total) by mouth every 3 (three) days.    Dispense:  2 tablet    Refill:  0    [name], MD [place]

## 2015-09-17 NOTE — Assessment & Plan Note (Signed)
Relatively well controlled. Prozac refilled. She is encouraged to follow up with a therapist and see her psychiatrist.

## 2015-09-17 NOTE — Assessment & Plan Note (Signed)
Patient declined pelvic exam today. Itching potentially related to yeast infection. We will give a trial of Diflucan and see if this is beneficial. If not beneficial she will need to follow-up for pelvic exam. Precautions.

## 2015-09-17 NOTE — Assessment & Plan Note (Signed)
Upper respiratory symptoms most consistent with allergic rhinitis. Patient will try Flonase and Claritin.

## 2015-09-17 NOTE — Assessment & Plan Note (Signed)
Patient with osteomyelitis of her right foot status post amputation part of her distal right foot and antibiotic therapy. Appears to be healing well. Patient no signs of infection. She will continue to follow with the wound Center at [facility]. She's given return precautions.

## 2015-09-25 DIAGNOSIS — Z79891 Long term (current) use of opiate analgesic: Secondary | ICD-10-CM | POA: Diagnosis not present

## 2015-09-25 DIAGNOSIS — M791 Myalgia: Secondary | ICD-10-CM | POA: Diagnosis not present

## 2015-09-25 DIAGNOSIS — M79671 Pain in right foot: Secondary | ICD-10-CM | POA: Diagnosis not present

## 2015-09-25 DIAGNOSIS — M8660 Other chronic osteomyelitis, unspecified site: Secondary | ICD-10-CM | POA: Diagnosis not present

## 2015-09-25 DIAGNOSIS — M25579 Pain in unspecified ankle and joints of unspecified foot: Secondary | ICD-10-CM | POA: Diagnosis not present

## 2015-09-25 DIAGNOSIS — M869 Osteomyelitis, unspecified: Secondary | ICD-10-CM | POA: Diagnosis not present

## 2015-09-25 DIAGNOSIS — G894 Chronic pain syndrome: Secondary | ICD-10-CM | POA: Diagnosis not present

## 2015-10-01 ENCOUNTER — Telehealth: Payer: Self-pay | Admitting: Family Medicine

## 2015-10-01 NOTE — Telephone Encounter (Signed)
Pt called about wanting to see if Dr Caryl Bis would call in a Zpac and cough medicine? Pt states she is not feeling better. Pt was seen on 09/17/15.  [id number] [address], [place]  Call pt @ [phone]. Thank you!

## 2015-10-01 NOTE — Telephone Encounter (Signed)
Patient will need reevaluation for consideration of antibiotics.

## 2015-10-01 NOTE — Telephone Encounter (Signed)
Needs an appt, thanks.

## 2015-10-01 NOTE — Telephone Encounter (Signed)
Please advise thanks.

## 2015-10-02 NOTE — Telephone Encounter (Signed)
Ok. I called pt and left a vm to call the office. Thank you! °

## 2015-10-16 ENCOUNTER — Ambulatory Visit: Admitting: Family Medicine

## 2015-11-20 DIAGNOSIS — M791 Myalgia: Secondary | ICD-10-CM | POA: Diagnosis not present

## 2015-11-20 DIAGNOSIS — M79671 Pain in right foot: Secondary | ICD-10-CM | POA: Diagnosis not present

## 2015-11-20 DIAGNOSIS — K5903 Drug induced constipation: Secondary | ICD-10-CM | POA: Diagnosis not present

## 2015-11-20 DIAGNOSIS — G894 Chronic pain syndrome: Secondary | ICD-10-CM | POA: Diagnosis not present

## 2015-11-20 DIAGNOSIS — M8660 Other chronic osteomyelitis, unspecified site: Secondary | ICD-10-CM | POA: Diagnosis not present

## 2015-11-20 DIAGNOSIS — M25579 Pain in unspecified ankle and joints of unspecified foot: Secondary | ICD-10-CM | POA: Diagnosis not present

## 2015-11-20 DIAGNOSIS — M869 Osteomyelitis, unspecified: Secondary | ICD-10-CM | POA: Diagnosis not present

## 2015-11-28 ENCOUNTER — Other Ambulatory Visit: Payer: Self-pay | Admitting: Family Medicine

## 2015-12-01 NOTE — Telephone Encounter (Signed)
Not on current med list  Last office visit 09/17/15 To follow up 3 months

## 2015-12-01 NOTE — Telephone Encounter (Signed)
LM for patient to call and schedule a follow up appointment.

## 2015-12-01 NOTE — Telephone Encounter (Signed)
Please find out what the patient needs this for and get her scheduled for follow-up.

## 2015-12-07 ENCOUNTER — Other Ambulatory Visit: Payer: Self-pay | Admitting: Family Medicine

## 2015-12-08 NOTE — Telephone Encounter (Signed)
I refused the medication. Please call 1 more time in then please send her a letter to have her call to schedule follow-up.

## 2015-12-08 NOTE — Telephone Encounter (Signed)
Please advise on refill. We have attempted to call the patient to schedule follow up appointment without any luck.

## 2015-12-24 DIAGNOSIS — G629 Polyneuropathy, unspecified: Secondary | ICD-10-CM | POA: Diagnosis not present

## 2015-12-24 DIAGNOSIS — I96 Gangrene, not elsewhere classified: Secondary | ICD-10-CM | POA: Diagnosis not present

## 2015-12-24 DIAGNOSIS — Z8619 Personal history of other infectious and parasitic diseases: Secondary | ICD-10-CM | POA: Diagnosis not present

## 2015-12-24 DIAGNOSIS — Z8739 Personal history of other diseases of the musculoskeletal system and connective tissue: Secondary | ICD-10-CM | POA: Diagnosis not present

## 2015-12-24 DIAGNOSIS — G8929 Other chronic pain: Secondary | ICD-10-CM | POA: Diagnosis not present

## 2015-12-24 DIAGNOSIS — Z9889 Other specified postprocedural states: Secondary | ICD-10-CM | POA: Diagnosis not present

## 2015-12-24 DIAGNOSIS — Z89419 Acquired absence of unspecified great toe: Secondary | ICD-10-CM | POA: Diagnosis not present

## 2015-12-24 DIAGNOSIS — L97519 Non-pressure chronic ulcer of other part of right foot with unspecified severity: Secondary | ICD-10-CM | POA: Diagnosis not present

## 2015-12-24 DIAGNOSIS — G600 Hereditary motor and sensory neuropathy: Secondary | ICD-10-CM | POA: Diagnosis not present

## 2016-01-15 ENCOUNTER — Other Ambulatory Visit: Payer: Self-pay | Admitting: Family Medicine

## 2016-02-11 ENCOUNTER — Other Ambulatory Visit: Payer: Self-pay | Admitting: Family Medicine

## 2016-02-11 ENCOUNTER — Telehealth: Payer: Self-pay

## 2016-02-11 NOTE — Telephone Encounter (Signed)
Called patient and left a message to call back to schedule follow up fir further refills on prozac

## 2016-02-12 NOTE — Telephone Encounter (Signed)
Left message to return call 

## 2016-02-16 NOTE — Telephone Encounter (Signed)
Left message to return call 

## 2016-02-16 NOTE — Telephone Encounter (Signed)
Patient is scheduled   

## 2016-03-15 ENCOUNTER — Other Ambulatory Visit: Payer: Self-pay | Admitting: Family Medicine

## 2016-03-15 NOTE — Telephone Encounter (Signed)
Last OV 09/17/15 last filled 02/11/16 60 0rf

## 2016-04-01 ENCOUNTER — Ambulatory Visit (INDEPENDENT_AMBULATORY_CARE_PROVIDER_SITE_OTHER): Payer: Medicare Other | Admitting: Family Medicine

## 2016-04-01 ENCOUNTER — Encounter: Payer: Self-pay | Admitting: Family Medicine

## 2016-04-01 DIAGNOSIS — R634 Abnormal weight loss: Secondary | ICD-10-CM | POA: Insufficient documentation

## 2016-04-01 DIAGNOSIS — F418 Other specified anxiety disorders: Secondary | ICD-10-CM

## 2016-04-01 DIAGNOSIS — G600 Hereditary motor and sensory neuropathy: Secondary | ICD-10-CM | POA: Diagnosis not present

## 2016-04-01 DIAGNOSIS — F419 Anxiety disorder, unspecified: Secondary | ICD-10-CM

## 2016-04-01 DIAGNOSIS — F32A Depression, unspecified: Secondary | ICD-10-CM

## 2016-04-01 DIAGNOSIS — F329 Major depressive disorder, single episode, unspecified: Secondary | ICD-10-CM

## 2016-04-01 MED ORDER — FLUOXETINE HCL 20 MG PO TABS: 40.0000 mg | ORAL_TABLET | Freq: Every day | ORAL | 5 refills | Status: DC

## 2016-04-01 MED ORDER — LIDOCAINE HCL 2 % EX GEL: CUTANEOUS | 0 refills | Status: DC

## 2016-04-01 NOTE — Progress Notes (Signed)
[name], MD Phone: [phone]  [name] is a 41 y.o. female who presents today for follow up.  Anxiety/depression: Patient notes sometimes she has a flare of this and gets moody and depressed. Occurs a few times a week. She has dealt with some significant family changes as her brother has colon cancer and she thinks this is contributing. She has remained on Prozac. She does see a psychiatrist for pain management. She notes no SI or HI.  Patient has had multiple amputations in her feet due to Charcot-Marie-Tooth disease and osteomyelitis. She is currently on morphine and oxycodone through the psychiatrist at the orthopedic office. She also uses lidocaine jelly and gabapentin. Notes these things do help with her pain. She notes she has not had any infections recently.  Patient has lost some weight over the last year. She has made some fairly significant diet changes with cutting out sodas and eating quite a bit less fried foods. She has also been exercising by doing crunches and body weight exercises. She's been more active with her grandchildren as well. She feels well overall. She's not noticed any new symptoms. She's not up-to-date on cancer screening.  PMH: Smoker   ROS see history of present illness  Objective  Physical Exam Vitals:   04/01/16 0955  BP: 120/80  Pulse: 73  Temp: 98.6 F (37 C)    BP Readings from Last 3 Encounters:  04/01/16 120/80  09/17/15 110/68  02/26/15 124/78   Wt Readings from Last 3 Encounters:  04/01/16 138 lb 6.4 oz (62.8 kg)  09/17/15 147 lb (66.7 kg)  02/26/15 152 lb (68.9 kg)    Physical Exam  Constitutional: No distress.  Cardiovascular: Normal rate, regular rhythm and normal heart sounds.   Pulmonary/Chest: Effort normal and breath sounds normal.  Musculoskeletal:  Bilateral feet with evidence of amputations and well-healed scars, sensation intact, 5 out of 5 strength in plantar flexion and dorsiflexion, 2+ DP pulses  bilaterally, feet are warm and well-perfused, no signs of infection  Neurological: She is alert.  Skin: Skin is warm and dry. She is not diaphoretic.     Assessment/Plan: Please see individual problem list.  Charcot-Marie-Tooth disease She's been following with orthopedics for her feet. Following with pain management through orthopedics. Has done well on her current pain regimen. Needs a refill of her lidocaine jelly. Does not use this very often. Does help some with phantom pain and throbbing. Refill will be given. She'll continue to follow with pain management.  Anxiety and depression Some days are worse than others with her depression. No SI or HI. Discussed having her see a therapist though she declined and opted to continue to monitor and follow with the psychiatrist who manages her pain medications. She'll continue on Prozac.  Weight loss Patient has made some intentional changes to her diet and activity level that have resulted in about a 14 pound weight loss over the last year. She is in the normal range for her BMI. I encouraged continued diet and exercise. We'll have her return for a physical to get her up-to-date on cancer screening. She'll monitor her weight loss and if continues to lose weight without changing anything will let us know.   No orders of the defined types were placed in this encounter.   Meds ordered this encounter  Medications  . lidocaine (XYLOCAINE) 2 % jelly    Sig: APPLY 1 APPLICATION TOPICALLY 2 (TWO) TIMES DAILY PRN PAIN. TO THE AREAS OF PRIOR AMPUTATION IN  YOUR FEET    Dispense:  30 mL    Refill:  0  . FLUoxetine (PROZAC) 20 MG tablet    Sig: Take 2 tablets (40 mg total) by mouth daily.    Dispense:  60 tablet    Refill:  [place], MD [place]

## 2016-04-01 NOTE — Assessment & Plan Note (Signed)
She's been following with orthopedics for her feet. Following with pain management through orthopedics. Has done well on her current pain regimen. Needs a refill of her lidocaine jelly. Does not use this very often. Does help some with phantom pain and throbbing. Refill will be given. She'll continue to follow with pain management.

## 2016-04-01 NOTE — Assessment & Plan Note (Signed)
Patient has made some intentional changes to her diet and activity level that have resulted in about a 14 pound weight loss over the last year. She is in the normal range for her BMI. I encouraged continued diet and exercise. We'll have her return for a physical to get her up-to-date on cancer screening. She'll monitor her weight loss and if continues to lose weight without changing anything will let us know.

## 2016-04-01 NOTE — Assessment & Plan Note (Signed)
Some days are worse than others with her depression. No SI or HI. Discussed having her see a therapist though she declined and opted to continue to monitor and follow with the psychiatrist who manages her pain medications. She'll continue on Prozac.

## 2016-04-01 NOTE — Patient Instructions (Addendum)
Nice to see you. I'm glad you're doing well. Please continue to monitor your anxiety and depression. Please monitor your feet closely. Please monitor your weight as well. You can continue to exercise and watch her diet.

## 2016-04-01 NOTE — Progress Notes (Signed)
Pre visit review using our clinic review tool, if applicable. No additional management support is needed unless otherwise documented below in the visit note. 

## 2016-04-19 ENCOUNTER — Other Ambulatory Visit: Payer: Self-pay

## 2016-04-19 MED ORDER — FLUOXETINE HCL 20 MG PO TABS: 40.0000 mg | ORAL_TABLET | Freq: Every day | ORAL | 0 refills | Status: DC

## 2016-04-19 NOTE — Telephone Encounter (Signed)
Requested 90 day refill, sent to pharmacy

## 2016-04-22 DIAGNOSIS — Z79891 Long term (current) use of opiate analgesic: Secondary | ICD-10-CM | POA: Diagnosis not present

## 2016-04-22 DIAGNOSIS — M79671 Pain in right foot: Secondary | ICD-10-CM | POA: Diagnosis not present

## 2016-04-22 DIAGNOSIS — K5903 Drug induced constipation: Secondary | ICD-10-CM | POA: Diagnosis not present

## 2016-04-22 DIAGNOSIS — M8660 Other chronic osteomyelitis, unspecified site: Secondary | ICD-10-CM | POA: Diagnosis not present

## 2016-04-22 DIAGNOSIS — G894 Chronic pain syndrome: Secondary | ICD-10-CM | POA: Diagnosis not present

## 2016-04-22 DIAGNOSIS — M869 Osteomyelitis, unspecified: Secondary | ICD-10-CM | POA: Diagnosis not present

## 2016-04-22 DIAGNOSIS — M791 Myalgia: Secondary | ICD-10-CM | POA: Diagnosis not present

## 2016-04-22 DIAGNOSIS — M25579 Pain in unspecified ankle and joints of unspecified foot: Secondary | ICD-10-CM | POA: Diagnosis not present

## 2016-05-05 ENCOUNTER — Other Ambulatory Visit: Payer: Self-pay | Admitting: Family Medicine

## 2016-05-05 NOTE — Telephone Encounter (Signed)
Last OV 04/01/16 last filled 04/01/16 30 0rf

## 2016-05-28 ENCOUNTER — Telehealth: Payer: Self-pay | Admitting: Family Medicine

## 2016-05-28 NOTE — Telephone Encounter (Signed)
Left pt message asking to call Allison back directly at [phone] to schedule AWV. Thanks! °

## 2016-06-05 ENCOUNTER — Other Ambulatory Visit: Payer: Self-pay | Admitting: Family Medicine

## 2016-06-17 DIAGNOSIS — K5903 Drug induced constipation: Secondary | ICD-10-CM | POA: Diagnosis not present

## 2016-06-17 DIAGNOSIS — M8660 Other chronic osteomyelitis, unspecified site: Secondary | ICD-10-CM | POA: Diagnosis not present

## 2016-06-17 DIAGNOSIS — M25579 Pain in unspecified ankle and joints of unspecified foot: Secondary | ICD-10-CM | POA: Diagnosis not present

## 2016-06-17 DIAGNOSIS — M791 Myalgia: Secondary | ICD-10-CM | POA: Diagnosis not present

## 2016-06-17 DIAGNOSIS — G894 Chronic pain syndrome: Secondary | ICD-10-CM | POA: Diagnosis not present

## 2016-06-17 DIAGNOSIS — M79671 Pain in right foot: Secondary | ICD-10-CM | POA: Diagnosis not present

## 2016-06-17 DIAGNOSIS — M869 Osteomyelitis, unspecified: Secondary | ICD-10-CM | POA: Diagnosis not present

## 2016-06-24 NOTE — Telephone Encounter (Signed)
Scheduled 07/13/16 °

## 2016-07-13 ENCOUNTER — Ambulatory Visit

## 2016-07-18 ENCOUNTER — Other Ambulatory Visit: Payer: Self-pay | Admitting: Family Medicine

## 2016-08-10 ENCOUNTER — Other Ambulatory Visit: Payer: Self-pay | Admitting: Family Medicine

## 2016-08-29 ENCOUNTER — Other Ambulatory Visit: Payer: Self-pay | Admitting: Family Medicine

## 2016-10-02 ENCOUNTER — Other Ambulatory Visit: Payer: Self-pay | Admitting: Family Medicine

## 2016-11-09 ENCOUNTER — Telehealth: Payer: Self-pay | Admitting: Family Medicine

## 2016-11-09 NOTE — Telephone Encounter (Signed)
Pt called and stated that she she had emergency oral surgery on Friday and is on antibiotics, which now has developed a yeast infection. Pt is unable to get in touch with the oral surgeon and wanted to know if Dr. Caryl Bis would call something in for her. Please advise, thank you!  [id number] [place], [place]  Call pt @ [phone]

## 2016-11-09 NOTE — Telephone Encounter (Signed)
Advised patient to go to walk in clinic. Patient agreed.

## 2016-11-10 ENCOUNTER — Other Ambulatory Visit: Payer: Self-pay | Admitting: Family Medicine

## 2016-11-25 DIAGNOSIS — M869 Osteomyelitis, unspecified: Secondary | ICD-10-CM | POA: Diagnosis not present

## 2016-11-25 DIAGNOSIS — M8660 Other chronic osteomyelitis, unspecified site: Secondary | ICD-10-CM | POA: Diagnosis not present

## 2016-11-25 DIAGNOSIS — K047 Periapical abscess without sinus: Secondary | ICD-10-CM | POA: Diagnosis not present

## 2016-11-25 DIAGNOSIS — M791 Myalgia, unspecified site: Secondary | ICD-10-CM | POA: Diagnosis not present

## 2016-11-25 DIAGNOSIS — Z79891 Long term (current) use of opiate analgesic: Secondary | ICD-10-CM | POA: Diagnosis not present

## 2016-11-25 DIAGNOSIS — K5903 Drug induced constipation: Secondary | ICD-10-CM | POA: Diagnosis not present

## 2016-11-25 DIAGNOSIS — M25579 Pain in unspecified ankle and joints of unspecified foot: Secondary | ICD-10-CM | POA: Diagnosis not present

## 2016-11-25 DIAGNOSIS — G894 Chronic pain syndrome: Secondary | ICD-10-CM | POA: Diagnosis not present

## 2016-11-25 DIAGNOSIS — M79671 Pain in right foot: Secondary | ICD-10-CM | POA: Diagnosis not present

## 2017-02-09 ENCOUNTER — Other Ambulatory Visit: Payer: Self-pay | Admitting: Family Medicine

## 2017-02-10 NOTE — Telephone Encounter (Signed)
Last OV 04/01/16 last filled Fluoxetine 11/10/16 180 0rf Lidocaine 11/10/16 30 ml 0rf

## 2017-02-10 NOTE — Telephone Encounter (Signed)
Sent to pharmacy.  Please get the patient scheduled for follow-up.  Thanks.

## 2017-02-11 NOTE — Telephone Encounter (Signed)
scheduled

## 2017-02-24 DIAGNOSIS — Z79891 Long term (current) use of opiate analgesic: Secondary | ICD-10-CM | POA: Diagnosis not present

## 2017-02-24 DIAGNOSIS — G894 Chronic pain syndrome: Secondary | ICD-10-CM | POA: Diagnosis not present

## 2017-02-24 DIAGNOSIS — M8660 Other chronic osteomyelitis, unspecified site: Secondary | ICD-10-CM | POA: Diagnosis not present

## 2017-02-24 DIAGNOSIS — M79671 Pain in right foot: Secondary | ICD-10-CM | POA: Diagnosis not present

## 2017-02-24 DIAGNOSIS — M25579 Pain in unspecified ankle and joints of unspecified foot: Secondary | ICD-10-CM | POA: Diagnosis not present

## 2017-02-24 DIAGNOSIS — K047 Periapical abscess without sinus: Secondary | ICD-10-CM | POA: Diagnosis not present

## 2017-02-24 DIAGNOSIS — M869 Osteomyelitis, unspecified: Secondary | ICD-10-CM | POA: Diagnosis not present

## 2017-02-24 DIAGNOSIS — K5903 Drug induced constipation: Secondary | ICD-10-CM | POA: Diagnosis not present

## 2017-04-07 ENCOUNTER — Telehealth: Payer: Self-pay | Admitting: Family Medicine

## 2017-04-07 NOTE — Telephone Encounter (Signed)
Copied from [place]. Topic: Quick Communication - See Telephone Encounter >> Apr 07, 2017 11:27 AM Conception Chancy, NT wrote: CRM for notification. See Telephone encounter for: 04/07/17.  Patient is calling and states her husband has died suddenly and would like for something for anxiety to help calm her. Please advise.   [id number] [address], [place] [address]  Phone: [phone] Fax: [phone]

## 2017-04-07 NOTE — Telephone Encounter (Signed)
Please advise 

## 2017-04-07 NOTE — Telephone Encounter (Signed)
Unfortunately there is nothing that I can call in for treatment of acute anxiety without her being seen in the office. The medications typically used for acute anxiety are controlled substances and I will not fill that type of medication without the patient being seen in the office.

## 2017-04-07 NOTE — Telephone Encounter (Signed)
I am sorry to hear about her husband and please give her my condolences.  Unfortunately she would need to be seen in the office to discuss appropriate treatment for her anxiety.

## 2017-04-07 NOTE — Telephone Encounter (Signed)
Patient calling back upset, states she is unable to come to appt, does not have the time due to her husband death. States can nothing be called in?

## 2017-04-07 NOTE — Telephone Encounter (Signed)
Left message to return call, ok for pec to speak to patient and schedule an appointment

## 2017-04-08 ENCOUNTER — Ambulatory Visit: Payer: Medicare Other | Admitting: Family Medicine

## 2017-04-08 NOTE — Telephone Encounter (Signed)
Left message to return call., ok for pec to speak to patient regarding message below

## 2017-04-11 NOTE — Telephone Encounter (Signed)
Called patient and she verbalized  Understanding of needing to come in for an appointment,she states at this time she is not able to but will call to schedule an appointment later. She also states that she appreciates Dr. Caryl Bis offering the appointment. She would like to know if she could get her oxycodone refilled if possible.

## 2017-04-11 NOTE — Telephone Encounter (Signed)
I do not prescribe her pain medications.  It looks like these were possibly last filled by [facility] in 2016.  She should contact the provider that was previously prescribing them.

## 2017-04-18 NOTE — Telephone Encounter (Signed)
Please advise 

## 2017-04-18 NOTE — Telephone Encounter (Signed)
Informed patient

## 2017-10-18 ENCOUNTER — Ambulatory Visit (INDEPENDENT_AMBULATORY_CARE_PROVIDER_SITE_OTHER): Payer: Medicare Other | Admitting: Family Medicine

## 2017-10-18 ENCOUNTER — Encounter: Payer: Self-pay | Admitting: Family Medicine

## 2017-10-18 VITALS — BP 108/68 | HR 89 | Temp 101.2°F | Resp 20 | Ht 67.0 in | Wt 157.0 lb

## 2017-10-18 DIAGNOSIS — R509 Fever, unspecified: Secondary | ICD-10-CM | POA: Diagnosis not present

## 2017-10-18 DIAGNOSIS — R05 Cough: Secondary | ICD-10-CM

## 2017-10-18 DIAGNOSIS — J209 Acute bronchitis, unspecified: Secondary | ICD-10-CM

## 2017-10-18 DIAGNOSIS — R062 Wheezing: Secondary | ICD-10-CM

## 2017-10-18 DIAGNOSIS — R058 Other specified cough: Secondary | ICD-10-CM

## 2017-10-18 LAB — POCT INFLUENZA A/B
Influenza A, POC: NEGATIVE
Influenza B, POC: NEGATIVE

## 2017-10-18 MED ORDER — AZITHROMYCIN 250 MG PO TABS: ORAL_TABLET | ORAL | 0 refills | Status: DC

## 2017-10-18 MED ORDER — GUAIFENESIN 100 MG/5ML PO SOLN: 5.0000 mL | ORAL | 0 refills | Status: DC | PRN

## 2017-10-18 MED ORDER — PREDNISONE 10 MG PO TABS: 10.0000 mg | ORAL_TABLET | Freq: Every day | ORAL | 0 refills | Status: AC

## 2017-10-18 MED ORDER — GUAIFENESIN-CODEINE 100-10 MG/5ML PO SOLN: 5.0000 mL | ORAL | 0 refills | Status: DC | PRN

## 2017-10-18 NOTE — Patient Instructions (Signed)

## 2017-10-18 NOTE — Progress Notes (Signed)
Subjective:    Patient ID: [name], female    DOB: Jul 31, 1975, 42 y.o.   MRN: [id number]  HPI   Patient presents to clinic complaining of fever, cough, overall body aches, feeling rundown for 4 days.  Unknown if she has had any sick contacts.  She has been taking Tylenol as needed for fever.  Patient has not yet had a flu vaccine for this season.  Patient is an everyday cigarette smoker.  Patient Active Problem List   Diagnosis Date Noted  . Weight loss 04/01/2016  . Yeast infection 09/17/2015  . Vitamin D deficiency 09/17/2015  . Muscular dystrophy ([place]) 03/26/2015  . Neuropathic pain 03/26/2015  . Chronic skin ulcer ([place]) 03/26/2015  . Irregular periods 02/26/2015  . Allergic rhinitis 02/26/2015  . Tooth pain 01/08/2015  . Anxiety and depression 12/26/2014  . Charcot-Marie-Tooth disease 12/26/2014  . Stress incontinence 12/26/2014  . Iron deficiency 12/26/2014  . Continuous opioid dependence ([place]) 09/11/2014  . Osteomyelitis ([place]) 07/16/2013  . Current tobacco use 07/11/2013  . Chronic pain 08/25/2012  . Foot pain 08/25/2012  . Abscess or cellulitis of foot 10/21/2011  . Other long term (current) drug therapy 09/30/2011  . 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 Arthritis, neuropathic 03/18/2011  . Closed dislocation of tarsometatarsal joint 03/18/2011  . Acquired deformities of toe 03/04/2011  . 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 Ulcer of foot ([place]) 12/21/2010   Social History   Tobacco Use  . Smoking status: Current Every Day Smoker  . Smokeless tobacco: Never Used  Substance Use Topics  . Alcohol use: Yes    Alcohol/week: 0.0 standard drinks    Comment: infrequent   Review of Systems   Constitutional: +fatigue and fever/chills.  HENT: Negative for congestion, ear pain, sinus pain and sore throat.   Eyes: Negative.   Respiratory:  +cough, and wheezing (when laying or coughing).  Denies feeling SOB. Cardiovascular:  Negative for chest pain, palpitations and leg swelling.  Gastrointestinal: Negative for abdominal pain, diarrhea, nausea and vomiting.  Genitourinary: Negative for dysuria, frequency and urgency.  Musculoskeletal: Negative for arthralgias and myalgias.  Skin: Negative for color change, pallor and rash.  Neurological: Negative for syncope, light-headedness and headaches.  Psychiatric/Behavioral: The patient is not nervous/anxious.       Objective:   Physical Exam  Constitutional: She is oriented to person, place, and time.  FEBRILE 101.2   HENT:  Head: Normocephalic and atraumatic.  Mild post nasal drip present  Eyes: Conjunctivae and EOM are normal. Right eye exhibits no discharge. Left eye exhibits no discharge. No scleral icterus.  Neck: Neck supple. No tracheal deviation present.  Cardiovascular: Normal rate and regular rhythm.  Pulmonary/Chest: Effort normal. No stridor. No respiratory distress. She has wheezes. She has no rales.  Expiratory wheezes. Harsh sounding cough.   Musculoskeletal: She exhibits no edema.  Gait normal.   Neurological: She is alert and oriented to person, place, and time. No cranial nerve deficit.  Skin: Skin is warm and dry. No rash noted. She is not diaphoretic. No pallor.  Psychiatric: She has a normal mood and affect.  Nursing note and vitals reviewed.     Vitals:   10/18/17 1544  BP: 108/68  Pulse: 89  Resp: 20  Temp: (!) 101.2 F (38.4 C)  SpO2: 97%     Assessment & Plan:   Acute bronchitis/productive cough/fever& chills -- point-of-care influenza testing negative in clinic.  CXR declined in clinic today. Due to cough,  wheezing, fever we will treat this as a bacterial bronchitis.  Patient will take Z-Pak & prednisone burst.  She will also use Robitussin with codeine as needed to calm cough.  Also advised she can take over-the-counter Mucinex during the daytime to help calm cough as this medication is not drowsy.  Advised to alternate Tylenol  and Motrin to keep fever under control.  Offered prescription for albuterol inhaler to use as needed to help with wheezing, but patient declines prescription and states she has albuterol inhaler at home.  Advised to rest, increase fluid intake, do good handwashing.   Patient advised to call clinic if symptoms are not improving in the next few days; can do CXR then if needed.  Also advised to follow-up in 2 weeks to be sure her symptoms have resolved, and also to follow-up on her other chronic medical issues.

## 2017-10-19 ENCOUNTER — Encounter: Payer: Self-pay | Admitting: Family Medicine

## 2017-10-24 ENCOUNTER — Ambulatory Visit (INDEPENDENT_AMBULATORY_CARE_PROVIDER_SITE_OTHER): Payer: Medicare Other | Admitting: Family

## 2017-10-24 ENCOUNTER — Encounter: Payer: Self-pay | Admitting: Family

## 2017-10-24 VITALS — BP 124/82 | HR 80 | Temp 98.5°F | Resp 15 | Ht 67.0 in | Wt 157.0 lb

## 2017-10-24 DIAGNOSIS — F32A Depression, unspecified: Secondary | ICD-10-CM

## 2017-10-24 DIAGNOSIS — R058 Other specified cough: Secondary | ICD-10-CM

## 2017-10-24 DIAGNOSIS — F329 Major depressive disorder, single episode, unspecified: Secondary | ICD-10-CM | POA: Diagnosis not present

## 2017-10-24 DIAGNOSIS — R05 Cough: Secondary | ICD-10-CM | POA: Diagnosis not present

## 2017-10-24 DIAGNOSIS — G8929 Other chronic pain: Secondary | ICD-10-CM

## 2017-10-24 DIAGNOSIS — F419 Anxiety disorder, unspecified: Secondary | ICD-10-CM | POA: Diagnosis not present

## 2017-10-24 MED ORDER — GUAIFENESIN-CODEINE 100-10 MG/5ML PO SOLN: 5.0000 mL | Freq: Every evening | ORAL | 0 refills | Status: DC | PRN

## 2017-10-24 MED ORDER — ALBUTEROL SULFATE (2.5 MG/3ML) 0.083% IN NEBU
2.5000 mg | INHALATION_SOLUTION | Freq: Once | RESPIRATORY_TRACT | Status: AC
  Administered 2017-10-24: 2.5 mg via RESPIRATORY_TRACT

## 2017-10-24 MED ORDER — GUAIFENESIN ER 600 MG PO TB12: 1200.0000 mg | ORAL_TABLET | Freq: Two times a day (BID) | ORAL | 0 refills | Status: DC

## 2017-10-24 MED ORDER — ALBUTEROL SULFATE HFA 108 (90 BASE) MCG/ACT IN AERS: 2.0000 | INHALATION_SPRAY | Freq: Four times a day (QID) | RESPIRATORY_TRACT | 0 refills | Status: DC | PRN

## 2017-10-24 MED ORDER — FLUOXETINE HCL 20 MG PO TABS: 20.0000 mg | ORAL_TABLET | Freq: Every day | ORAL | 0 refills | Status: DC

## 2017-10-24 NOTE — Progress Notes (Signed)
Subjective:    Patient ID: [name], female    DOB: [birth year], 42 y.o.   MRN: [id number]  CC: [name] is a 42 y.o. female who presents today for follow up.   HPI: Patient here today as she continues to cough.  She was seen 6 days ago in our clinic and started on azithromycin, presdnisone. Cough is improved however wasn't sure if medications were long enough. Coughing spells are slightly better, coughs at night . No sinus pressure, sore throat, ear pain.   Fever has resolved.   No CP.   Thought had albuterol at home, she had run out so has not been using.  Wheezing a'  little.'  She is a current smoker  Husband passed away 6 months ago.   Depression- worsened. very tearful. Crying a lot. not on prozac as had a lot going on and 'forgot' to take the medication. endorses Anxiety. No hallucinations. Has NOT stayed up for days without sleep. No h/o bipolar.  Looking forward to having  More grandchildren. Family is supportive. No si/hi. No h/o suicide attempt  Chronic pain- would like a referral closer to home. Had been going to [facility] Dr Luetta Nutting for chronic foot pain s/p amputations. Doesn't have car right now, no electricity. Financial stress at this time.   On disability.  Working on getting benefits from late husband , has an Forensic psychologist.       negative flu in clinic.   HISTORY:  Past Medical History:  Diagnosis Date  . Allergic rhinitis   . Anxiety   . Asthma   . Charcot-Marie-Tooth disease   . Chickenpox   . Depression   . Lisfranc dislocation   . Muscular dystrophy ([place])   . Osteomyelitis of foot ([place])   . Stress incontinence    Past Surgical History:  Procedure Laterality Date  . ECTOPIC PREGNANCY SURGERY  1995  . Great toe amputation Left April 2011  . Great toe amputation Right October 2015  . TUBAL LIGATION  2005   Family History  Problem Relation Age of Onset  . Hyperlipidemia Father   . Hypertension Father   . Sudden death  Sister        Pneumonia  . Diabetes Paternal Grandmother   . Muscular dystrophy Unknown        Mother, grandparents, other relatives on maternal side    Allergies: Bee venom; Vancomycin; Penicillins; Sulfamethoxazole-trimethoprim; Tramadol; and Clindamycin Current Outpatient Medications on File Prior to Visit  Medication Sig Dispense Refill  . lidocaine (XYLOCAINE) 2 % jelly APPLY TO AFFECTED AREA TWICE A DAY FOR PAIN PRIOR AMPUTATION ON FEET (Patient not taking: Reported on 10/18/2017) 30 mL 0  . morphine (MS CONTIN) 30 MG 12 hr tablet Reported on 02/26/2015    . oxyCODONE (ROXICODONE) 15 MG immediate release tablet Reported on 01/08/2015    . pantoprazole (PROTONIX) 40 MG tablet Take by mouth.     No current facility-administered medications on file prior to visit.     Social History   Tobacco Use  . Smoking status: Current Every Day Smoker  . Smokeless tobacco: Never Used  Substance Use Topics  . Alcohol use: Yes    Alcohol/week: 0.0 standard drinks    Comment: infrequent  . Drug use: No    Review of Systems  Constitutional: Negative for chills and fever.  HENT: Positive for congestion. Negative for ear pain, sore throat and trouble swallowing.   Respiratory: Positive for cough. Negative for shortness  of breath and wheezing.   Cardiovascular: Negative for chest pain and palpitations.  Gastrointestinal: Negative for nausea and vomiting.  Musculoskeletal: Positive for arthralgias (BL feet. s/p amputation).  Psychiatric/Behavioral: Negative for confusion, dysphoric mood, hallucinations, sleep disturbance and suicidal ideas. The patient is nervous/anxious.       Objective:    BP 124/82 (BP Location: Left Arm, Patient Position: Sitting, Cuff Size: Normal)   Pulse 80   Temp 98.5 F (36.9 C) (Oral)   Resp 15   Ht 5\' 7"  (1.702 m)   Wt 157 lb (71.2 kg)   LMP 02/24/2017 (Approximate)   SpO2 98%   BMI 24.59 kg/m  BP Readings from Last 3 Encounters:  10/24/17 124/82    10/18/17 108/68  04/01/16 120/80   Wt Readings from Last 3 Encounters:  10/24/17 157 lb (71.2 kg)  10/18/17 157 lb (71.2 kg)  04/01/16 138 lb 6.4 oz (62.8 kg)    Physical Exam  Constitutional: She appears well-developed and well-nourished.  Eyes: Conjunctivae are normal.  Cardiovascular: Normal rate, regular rhythm, normal heart sounds and normal pulses.  Pulmonary/Chest: Effort normal and breath sounds normal. She has no wheezes. She has no rhonchi. She has no rales.  Neurological: She is alert.  Skin: Skin is warm and dry.  Psychiatric: She has a normal mood and affect. Her speech is normal and behavior is normal. Thought content normal.  tearful  Vitals reviewed. Patient felt significantly better after albuterol treatment. Lung sounds clear and increased      Assessment & Plan:   Problem List Items Addressed This Visit      Other   Anxiety and depression    Grieving recent loss of her husband.  We will restart Prozac.  Patient will trial 20 mg and then transition to her prior dose of 40 mg once a day.  Referral counseling as well.  Very close follow-up in 2 weeks.      Relevant Medications   FLUoxetine (PROZAC) 20 MG tablet   Other Relevant Orders   Ambulatory referral to Psychology   Chronic pain    Patient is under tremendous financial stress at this time.  She would like to be established with pain management closer to home.  Referral placed.  She will continue follow-up with her PCP.      Relevant Medications   FLUoxetine (PROZAC) 20 MG tablet   Other Relevant Orders   Ambulatory referral to Pain Clinic   Productive cough - Primary    Improved, SaO2 98%.  I am reassured this patient felt better after nebulizer today in the office.  I discussed with her that I suspect if she would have an albuterol inhaler at home, that she continue to improve.  she is afebrile, I suspect that she had adequate treatment with azithromycin, prednisone.  I have given her short course  of cough syrup, albuterol.  She will stay vigilant let me know if no improvement.  I looked up patient on [place] Controlled Substances Reporting System and saw no activity that raised concern of inappropriate use.        Relevant Medications   guaiFENesin-codeine 100-10 MG/5ML syrup   albuterol (PROVENTIL HFA;VENTOLIN HFA) 108 (90 Base) MCG/ACT inhaler   guaiFENesin (MUCINEX) 600 MG 12 hr tablet   albuterol (PROVENTIL) (2.5 MG/3ML) 0.083% nebulizer solution 2.5 mg (Completed)   Other Relevant Orders   PR INHAL RX, AIRWAY OBST/DX SPUTUM INDUCT       I have discontinued Javae M. Voorheis's gabapentin, fluticasone,  and azithromycin. I have also changed her FLUoxetine and guaiFENesin-codeine. Additionally, I am having her start on albuterol and guaiFENesin. Lastly, I am having her maintain her pantoprazole, morphine, oxyCODONE, and lidocaine. We administered albuterol.   Meds ordered this encounter  Medications  . FLUoxetine (PROZAC) 20 MG tablet    Sig: Take 1 tablet (20 mg total) by mouth daily.    Dispense:  180 tablet    Refill:  0    Patient needs to schedule a follow-up for further refills.    Order Specific Question:   Supervising Provider    Answer:   Crecencio Mc [[id number]]  . guaiFENesin-codeine 100-10 MG/5ML syrup    Sig: Take 5 mLs by mouth at bedtime as needed for cough.    Dispense:  30 mL    Refill:  0    Cancel plain robitussin Rx    Order Specific Question:   Supervising Provider    Answer:   Deborra Medina L [[id number]]  . albuterol (PROVENTIL HFA;VENTOLIN HFA) 108 (90 Base) MCG/ACT inhaler    Sig: Inhale 2 puffs into the lungs every 6 (six) hours as needed for wheezing or shortness of breath.    Dispense:  1 Inhaler    Refill:  0    Order Specific Question:   Supervising Provider    Answer:   Derrel Nip, TERESA L [[id number]]  . guaiFENesin (MUCINEX) 600 MG 12 hr tablet    Sig: Take 2 tablets (1,200 mg total) by mouth 2 (two) times daily.    Dispense:  28 tablet    Refill:  0     Order Specific Question:   Supervising Provider    Answer:   Deborra Medina L [[id number]]  . albuterol (PROVENTIL) (2.5 MG/3ML) 0.083% nebulizer solution 2.5 mg    Return precautions given.   Risks, benefits, and alternatives of the medications and treatment plan prescribed today were discussed, and patient expressed understanding.   Education regarding symptom management and diagnosis given to patient on AVS.  Continue to follow with Leone Haven, MD for routine health maintenance.   Jossie Demetrius Revel and I agreed with plan.   Mable Paris, FNP

## 2017-10-24 NOTE — Patient Instructions (Addendum)
Start mucinex as prescribed  Start albuterol  Use albuterol every 6 hours for first 24 hours to get good medication into the lungs and loosen congestion; after, you may use as needed and eventually stop all together when cough resolves.  Please take cough medication at night only as needed. As we discussed, I do not recommend dosing throughout the day as coughing is a protective mechanism . It also helps to break up thick mucous.  Do not take cough suppressants with alcohol as can lead to trouble breathing. Advise caution if taking cough suppressant and operating machinery ( i.e driving a car) as you may feel very tired.     Start back on prozac 20mg  once per day for ONE week.  Then following week, start your old dose, which is 40mg  once per day  Close follow up with me in 2 weeks  Today we discussed referrals, orders. COUNSELING   I have placed these orders in the system for you.  Please be sure to give [name] a call if you have not heard from our office regarding this. We should hear from [name] within ONE week with information regarding your appointment. If not, please let me know immediately.   Our hope is for gradual improvement of mood since starting medication; however this may take several weeks.   If you start to have unusual thoughts, thoughts of hurting yourself, or anyone else, please go immediately to the emergency department.    National Suicide Prevention Hotline - available 24 hours a day, 7 days a week.  631 330 5331  Major Depressive Disorder Major depressive disorder is a mental illness. It also may be called clinical depression or unipolar depression. Major depressive disorder usually causes feelings of sadness, hopelessness, or helplessness. Some people with this disorder do not feel particularly sad but lose interest in doing things they used to enjoy (anhedonia). Major depressive disorder also can cause physical symptoms. It can interfere with work, school, relationships,  and other normal everyday activities. The disorder varies in severity but is longer lasting and more serious than the sadness we all feel from time to time in our lives. Major depressive disorder often is triggered by stressful life events or major life changes. Examples of these triggers include divorce, loss of your job or home, a move, and the death of a family member or close friend. Sometimes this disorder occurs for no obvious reason at all. People who have family members with major depressive disorder or bipolar disorder are at higher risk for developing this disorder, with or without life stressors. Major depressive disorder can occur at any age. It may occur just once in your life (single episode major depressive disorder). It may occur multiple times (recurrent major depressive disorder). SYMPTOMS People with major depressive disorder have either anhedonia or depressed mood on nearly a daily basis for at least 2 weeks or longer. Symptoms of depressed mood include:  Feelings of sadness (blue or down in the dumps) or emptiness.  Feelings of hopelessness or helplessness.  Tearfulness or episodes of crying (may be observed by others).  Irritability (children and adolescents). In addition to depressed mood or anhedonia or both, people with this disorder have at least four of the following symptoms:  Difficulty sleeping or sleeping too much.   Significant change (increase or decrease) in appetite or weight.   Lack of energy or motivation.  Feelings of guilt and worthlessness.   Difficulty concentrating, remembering, or making decisions.  Unusually slow movement (psychomotor retardation) or  restlessness (as observed by others).   Recurrent wishes for death, recurrent thoughts of self-harm (suicide), or a suicide attempt. People with major depressive disorder commonly have persistent negative thoughts about themselves, other people, and the world. People with severe major depressive  disorder may experiencedistorted beliefs or perceptions about the world (psychotic delusions). They also may see or hear things that are not real (psychotic hallucinations). DIAGNOSIS Major depressive disorder is diagnosed through an assessment by your health care provider. Your health care provider will ask aboutaspects of your daily life, such as mood,sleep, and appetite, to see if you have the diagnostic symptoms of major depressive disorder. Your health care provider may ask about your medical history and use of alcohol or drugs, including prescription medicines. Your health care provider also may do a physical exam and blood work. This is because certain medical conditions and the use of certain substances can cause major depressive disorder-like symptoms (secondary depression). Your health care provider also may refer you to a mental health specialist for further evaluation and treatment. TREATMENT It is important to recognize the symptoms of major depressive disorder and seek treatment. The following treatments can be prescribed for this disorder:   Medicine. Antidepressant medicines usually are prescribed. Antidepressant medicines are thought to correct chemical imbalances in the brain that are commonly associated with major depressive disorder. Other types of medicine may be added if the symptoms do not respond to antidepressant medicines alone or if psychotic delusions or hallucinations occur.  Talk therapy. Talk therapy can be helpful in treating major depressive disorder by providing support, education, and guidance. Certain types of talk therapy also can help with negative thinking (cognitive behavioral therapy) and with relationship issues that trigger this disorder (interpersonal therapy). A mental health specialist can help determine which treatment is best for you. Most people with major depressive disorder do well with a combination of medicine and talk therapy. Treatments involving  electrical stimulation of the brain can be used in situations with extremely severe symptoms or when medicine and talk therapy do not work over time. These treatments include electroconvulsive therapy, transcranial magnetic stimulation, and vagal nerve stimulation.   This information is not intended to replace advice given to you by your health care provider. Make sure you discuss any questions you have with your health care provider.   Document Released: 05/01/2012 Document Revised: 01/25/2014 Document Reviewed: 05/01/2012 Elsevier Interactive Patient Education [employer].

## 2017-10-24 NOTE — Assessment & Plan Note (Signed)
Grieving recent loss of her husband.  We will restart Prozac.  Patient will trial 20 mg and then transition to her prior dose of 40 mg once a day.  Referral counseling as well.  Very close follow-up in 2 weeks.

## 2017-10-24 NOTE — Assessment & Plan Note (Addendum)
Improved, SaO2 98%.  I am reassured this patient felt better after nebulizer today in the office.  I discussed with her that I suspect if she would have an albuterol inhaler at home, that she continue to improve.  she is afebrile, I suspect that she had adequate treatment with azithromycin, prednisone.  I have given her short course of cough syrup, albuterol.  She will stay vigilant let me know if no improvement.  I looked up patient on [place] Controlled Substances Reporting System and saw no activity that raised concern of inappropriate use.

## 2017-10-24 NOTE — Assessment & Plan Note (Signed)
Patient is under tremendous financial stress at this time.  She would like to be established with pain management closer to home.  Referral placed.  She will continue follow-up with her PCP.

## 2017-10-26 ENCOUNTER — Ambulatory Visit: Admitting: Family

## 2017-11-07 ENCOUNTER — Ambulatory Visit: Payer: Self-pay | Admitting: Psychology

## 2017-12-13 DIAGNOSIS — K5903 Drug induced constipation: Secondary | ICD-10-CM | POA: Diagnosis not present

## 2017-12-13 DIAGNOSIS — F418 Other specified anxiety disorders: Secondary | ICD-10-CM | POA: Diagnosis not present

## 2017-12-13 DIAGNOSIS — G894 Chronic pain syndrome: Secondary | ICD-10-CM | POA: Diagnosis not present

## 2017-12-13 DIAGNOSIS — M79671 Pain in right foot: Secondary | ICD-10-CM | POA: Diagnosis not present

## 2017-12-13 DIAGNOSIS — M869 Osteomyelitis, unspecified: Secondary | ICD-10-CM | POA: Diagnosis not present

## 2017-12-13 DIAGNOSIS — K047 Periapical abscess without sinus: Secondary | ICD-10-CM | POA: Diagnosis not present

## 2017-12-13 DIAGNOSIS — M8660 Other chronic osteomyelitis, unspecified site: Secondary | ICD-10-CM | POA: Diagnosis not present

## 2017-12-13 DIAGNOSIS — M25571 Pain in right ankle and joints of right foot: Secondary | ICD-10-CM | POA: Diagnosis not present

## 2017-12-28 ENCOUNTER — Telehealth: Payer: Self-pay | Admitting: Family Medicine

## 2017-12-28 DIAGNOSIS — F329 Major depressive disorder, single episode, unspecified: Secondary | ICD-10-CM

## 2017-12-28 DIAGNOSIS — F419 Anxiety disorder, unspecified: Secondary | ICD-10-CM

## 2017-12-28 DIAGNOSIS — F32A Depression, unspecified: Secondary | ICD-10-CM

## 2017-12-28 NOTE — Telephone Encounter (Signed)
Copied from [place] [phone]. Topic: Quick Communication - Rx Refill/Question >> Dec 28, 2017  4:29 PM Reyne Dumas L wrote: Medication:  FLUoxetine (PROZAC) 20 MG tablet  Has the patient contacted their pharmacy? Pt states that physician told her to start with one tablet and increase to two.  Pt states that she is now taking two pills a day but the pharmacy isn't able to refill because the script is written for one a day and now she is out.  Pt states she has been out of medication for two days. (Agent: If no, request that the patient contact the pharmacy for the refill.) (Agent: If yes, when and what did the pharmacy advise?)  Preferred Pharmacy (with phone number or street name): [id number] [address], [place] [phone] (Phone) [phone] (Fax)  Agent: Please be advised that RX refills may take up to 3 business days. We ask that you follow-up with your pharmacy.

## 2017-12-29 MED ORDER — FLUOXETINE HCL 20 MG PO TABS: 40.0000 mg | ORAL_TABLET | Freq: Every day | ORAL | 0 refills | Status: DC

## 2017-12-29 NOTE — Telephone Encounter (Signed)
Sent to PCP for approval.  

## 2017-12-29 NOTE — Telephone Encounter (Signed)
Please advise on increase & I can send to pharmacy?

## 2017-12-29 NOTE — Telephone Encounter (Signed)
Note from visit with Joycelyn Schmid reviewed. Prozac refill sent to pharmacy. The patient has not seen me in >1 year. She needs to follow-up in the office with myself or with Joycelyn Schmid for her depression. She was supposed to follow-up 2 weeks after her most recent visit. Please get her scheduled. Thanks.

## 2017-12-29 NOTE — Telephone Encounter (Signed)
Called and spoke with patient. Pt advised and voiced understanding. Pt has been scheduled for a 1 month follow up appt.   Pt stated that her husband passed a year ago and she has kind of just ben off the grid pt stated.   Pt stated that she is seeing a [facility] grief counselor for help and they suggested for [facility] the [facility]. Pt stated that she has been without a car and just recently got one and is living off her disability check.

## 2017-12-30 NOTE — Telephone Encounter (Signed)
Noted. We will see her at follow-up.

## 2018-01-25 ENCOUNTER — Telehealth: Payer: Self-pay | Admitting: *Deleted

## 2018-01-25 NOTE — Telephone Encounter (Signed)
Copied from [place] [id number]. Topic: Appointment Scheduling - Scheduling Inquiry for Clinic >> Jan 25, 2018  7:16 AM Conception Chancy, NT wrote: Reason for CRM: patient is calling and requesting an appointment today for a head cold and anxiety due to husband passing away. She would like to know if any way she can be seen today. Please contact.

## 2018-01-25 NOTE — Telephone Encounter (Signed)
Noted.  Patient should keep the appointment with Lauren tomorrow.  If we have a cancellation today I could see her in the office today though otherwise she should keep her appointment for tomorrow.

## 2018-01-25 NOTE — Telephone Encounter (Signed)
Patient says she been sick for 3 days no fever or chills , just coughing spells especially bad at taking robitussin , and mucinex . Just recently lost husband having hard time dealing with the loss , cannot focus. Patient crying on phone.Patient stated " I cannot deal with this, I cannot handle it any longer, if we have an openning today let me know , I have scheduled with [place] tomorrow.

## 2018-01-25 NOTE — Telephone Encounter (Signed)
Tried to reach patient no answer or voicemail.

## 2018-01-26 ENCOUNTER — Ambulatory Visit (INDEPENDENT_AMBULATORY_CARE_PROVIDER_SITE_OTHER): Payer: Medicare Other | Admitting: Family Medicine

## 2018-01-26 ENCOUNTER — Ambulatory Visit (INDEPENDENT_AMBULATORY_CARE_PROVIDER_SITE_OTHER): Payer: Medicare Other

## 2018-01-26 ENCOUNTER — Encounter: Payer: Self-pay | Admitting: Family Medicine

## 2018-01-26 VITALS — BP 128/62 | HR 88 | Temp 99.0°F | Ht 67.0 in | Wt 156.6 lb

## 2018-01-26 DIAGNOSIS — F32A Depression, unspecified: Secondary | ICD-10-CM

## 2018-01-26 DIAGNOSIS — F4329 Adjustment disorder with other symptoms: Secondary | ICD-10-CM

## 2018-01-26 DIAGNOSIS — F17210 Nicotine dependence, cigarettes, uncomplicated: Secondary | ICD-10-CM

## 2018-01-26 DIAGNOSIS — F329 Major depressive disorder, single episode, unspecified: Secondary | ICD-10-CM | POA: Diagnosis not present

## 2018-01-26 DIAGNOSIS — R059 Cough, unspecified: Secondary | ICD-10-CM

## 2018-01-26 DIAGNOSIS — F419 Anxiety disorder, unspecified: Secondary | ICD-10-CM

## 2018-01-26 DIAGNOSIS — F4321 Adjustment disorder with depressed mood: Secondary | ICD-10-CM

## 2018-01-26 DIAGNOSIS — F4381 Prolonged grief disorder: Secondary | ICD-10-CM

## 2018-01-26 DIAGNOSIS — R05 Cough: Secondary | ICD-10-CM

## 2018-01-26 DIAGNOSIS — R0982 Postnasal drip: Secondary | ICD-10-CM

## 2018-01-26 DIAGNOSIS — Z23 Encounter for immunization: Secondary | ICD-10-CM | POA: Diagnosis not present

## 2018-01-26 MED ORDER — FLUOXETINE HCL 20 MG PO TABS: 60.0000 mg | ORAL_TABLET | Freq: Every day | ORAL | 2 refills | Status: DC

## 2018-01-26 MED ORDER — ALPRAZOLAM 0.25 MG PO TABS: 0.2500 mg | ORAL_TABLET | Freq: Two times a day (BID) | ORAL | 0 refills | Status: DC | PRN

## 2018-01-26 MED ORDER — BUSPIRONE HCL 10 MG PO TABS: 10.0000 mg | ORAL_TABLET | Freq: Two times a day (BID) | ORAL | 2 refills | Status: DC

## 2018-01-26 MED ORDER — LORATADINE 10 MG PO TABS: 10.0000 mg | ORAL_TABLET | Freq: Every day | ORAL | 5 refills | Status: DC

## 2018-01-26 NOTE — Progress Notes (Signed)
Subjective:    Patient ID: [name], female    DOB: [birth year], 43 y.o.   MRN: [id number]  HPI   Patient presents to clinic complaining of persistent cough for 3 months, and also having difficulty dealing with her husband's death.  Cough has been present since October 2019.  Patient states the cough mainly is dry, but is very annoying.  Patient is a current everyday smoker.  Also has some nasal congestion with postnasal drip.  Currently does not take any sort of allergy medicine.  She has used cough syrup, and Mucinex to help cough which do help somewhat, however cough never fully resolves.  Denies fever or chills.  Denies shortness of breath or wheezing.  Has an inhaler, but has not used it recently.   Patient's husband passed away unexpectedly in 2017/04/19.  Patient continues to have a difficult time dealing with his passing.  States she is not yet getting his [facility] benefits, so her pain the bills has been quite difficult.  Patient states her house is in foreclosure, and her car was repossessed.  Patient states she was able to get a small car with some money she had saved, but car broke down last week.  States she is not sure where she will go once the house foreclosure is complete.  Patient is extremely stressed, and tired.  Patient states she breaks down and cries often.  States she was trying to attend counseling, but since her car has broken down she has to have rides everywhere and currently is unable to do counseling.  Denies any SI or HI.  Patient states she feels like she just needs a sleep all the time, so she does not have to deal with everything going on in her life.  Patient requests flu vaccine today.  Patient Active Problem List   Diagnosis Date Noted  . Productive cough 10/24/2017  . Weight loss 04/01/2016  . Yeast infection 09/17/2015  . Vitamin D deficiency 09/17/2015  . Muscular dystrophy ([place]) 03/26/2015  . Neuropathic pain 03/26/2015  . Chronic skin ulcer ([place])  03/26/2015  . Irregular periods 02/26/2015  . Allergic rhinitis 02/26/2015  . Tooth pain 01/08/2015  . Anxiety and depression 12/26/2014  . Charcot-Marie-Tooth disease 12/26/2014  . Stress incontinence 12/26/2014  . Iron deficiency 12/26/2014  . Continuous opioid dependence ([place]) 09/11/2014  . Osteomyelitis ([place]) 07/16/2013  . Current tobacco use 07/11/2013  . Chronic pain 08/25/2012  . Foot pain 08/25/2012  . Abscess or cellulitis of foot 10/21/2011  . Other long term (current) drug therapy 09/30/2011  . 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 Arthritis, neuropathic 03/18/2011  . Closed dislocation of tarsometatarsal joint 03/18/2011  . Acquired deformities of toe 03/04/2011  . 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 Ulcer of foot ([place]) 12/21/2010   Social History   Tobacco Use  . Smoking status: Current Every Day Smoker  . Smokeless tobacco: Never Used  Substance Use Topics  . Alcohol use: Yes    Alcohol/week: 0.0 standard drinks    Comment: infrequent    Review of Systems  Constitutional: Negative for chills, fatigue and fever.  HENT: +nasal congestion, clear, for 2-3 months Eyes: Negative.   Respiratory: +cough for 2-3 months. Negative for shortness of breath and wheezing.   Cardiovascular: Negative for chest pain, palpitations and leg swelling.  Gastrointestinal: Negative for abdominal pain, diarrhea, nausea and vomiting.  Genitourinary: Negative for dysuria, frequency and urgency.  Musculoskeletal: Negative for arthralgias and myalgias.  Skin: Negative for color change, pallor and rash.  Neurological: Negative for syncope, light-headedness and headaches.  Psychiatric/Behavioral: The patient is nervous/anxious, very stressed, bereavement.       Objective:   Physical Exam Vitals signs and nursing note reviewed.  Constitutional:      Appearance: Normal appearance. She is not toxic-appearing.  HENT:     Nose:     Comments: +post nasal drip  with some clear-yellow nasal drainage    Mouth/Throat:     Mouth: Mucous membranes are moist.  Eyes:     General: No scleral icterus.    Extraocular Movements: Extraocular movements intact.     Pupils: Pupils are equal, round, and reactive to light.  Cardiovascular:     Rate and Rhythm: Normal rate and regular rhythm.  Pulmonary:     Effort: Pulmonary effort is normal. No respiratory distress.     Breath sounds: Normal breath sounds. No wheezing, rhonchi or rales.  Neurological:     Mental Status: She is alert and oriented to person, place, and time.     Gait: Gait normal.  Psychiatric:        Mood and Affect: Mood normal.        Thought Content: Thought content normal.     Comments: Tearful when discussing husband's death, and now having to deal with all of the bills and everything happening in her life due to her inability to financially sustain the lifestyle she had with her husband.     Vitals:   01/26/18 1340  BP: 128/62  Pulse: 88  Temp: 99 F (37.2 C)  SpO2: 97%      Wt Readings from Last 3 Encounters:  01/26/18 156 lb 9.6 oz (71 kg)  10/24/17 157 lb (71.2 kg)  10/18/17 157 lb (71.2 kg)    Assessment & Plan:    A total of 40  minutes were spent face-to-face with the patient during this encounter and over half of that time was spent on counseling and coordination of care. The patient was counseled on anxiety, depression, grief.   Anxiety and depression, grief reaction with prolonged bereavement - patient will continue Prozac, she will increase dose from 40 mg/day to 60 mg/day.  Patient also was interested in adding something on top of the Prozac to better help her depression and anxiety.  We will trial addition of BuSpar 10 mg twice daily.  Patient states there are times where her anxiety and crying outburst becomes very intense due to missing her husband and dealing with all of the stress, and would really like small dose of something to help as needed to get through  this.  Patient will use Xanax 0.25 mg as needed sparingly.  Advised patient that Xanax is not meant to be long-term, and is only meant to be used in times of great need for anxiety breakthrough.  Patient verbalizes understanding that this will not be a long-term prescription.  [facility] PMP registry checked.  Patient is interested in doing counseling, but states right now with her car being broken down she will be unable to get to appointments.  Prolonged cough, cigarette smoker, nasal congestion - I am wondering if the cough is related to chronic postnasal drip.  Patient will begin Claritin once per day and we will do chest x-ray in office today.  I also wonder if her long-term smoking is contributory to the prolonged cough and if patient possibly has COPD.  Chest x-ray will help [name] further investigate cough,  it is possible patient will need pulmonology referral for pulmonary function testing.  Flu vaccine given in clinic  Patient will keep regularly scheduled follow-up as planned at the end of January 2020.  Advised she can return to clinic sooner if any issues arise.

## 2018-02-05 ENCOUNTER — Other Ambulatory Visit: Payer: Self-pay | Admitting: Family Medicine

## 2018-02-05 DIAGNOSIS — F32A Depression, unspecified: Secondary | ICD-10-CM

## 2018-02-05 DIAGNOSIS — F419 Anxiety disorder, unspecified: Secondary | ICD-10-CM

## 2018-02-05 DIAGNOSIS — F329 Major depressive disorder, single episode, unspecified: Secondary | ICD-10-CM

## 2018-02-06 NOTE — Telephone Encounter (Signed)
Please contact the patient and see how she has been doing with the buspirone as well as increased dose of Prozac.  The Xanax was to be a short-term medication.  Depending on how she has been doing I will consider 1 additional refill though this will not be a long-term medication that will be repeatedly prescribed.

## 2018-02-07 NOTE — Telephone Encounter (Signed)
I called patient & made her aware. She stated that she would keep f/u appointments.

## 2018-02-07 NOTE — Telephone Encounter (Signed)
Short-term refill given.  She needs to keep her upcoming appointment.  Thanks.

## 2018-02-15 ENCOUNTER — Ambulatory Visit (INDEPENDENT_AMBULATORY_CARE_PROVIDER_SITE_OTHER): Payer: Medicare Other | Admitting: Family Medicine

## 2018-02-15 ENCOUNTER — Encounter: Payer: Self-pay | Admitting: Family Medicine

## 2018-02-15 ENCOUNTER — Ambulatory Visit (INDEPENDENT_AMBULATORY_CARE_PROVIDER_SITE_OTHER): Payer: Medicare Other

## 2018-02-15 VITALS — BP 118/80 | HR 82 | Temp 99.0°F | Resp 16 | Ht 67.5 in | Wt 161.8 lb

## 2018-02-15 VITALS — BP 118/80 | HR 82 | Temp 99.0°F | Ht 67.5 in | Wt 161.8 lb

## 2018-02-15 DIAGNOSIS — R051 Acute cough: Secondary | ICD-10-CM | POA: Insufficient documentation

## 2018-02-15 DIAGNOSIS — F32A Depression, unspecified: Secondary | ICD-10-CM

## 2018-02-15 DIAGNOSIS — Z0000 Encounter for general adult medical examination without abnormal findings: Secondary | ICD-10-CM | POA: Diagnosis not present

## 2018-02-15 DIAGNOSIS — F419 Anxiety disorder, unspecified: Secondary | ICD-10-CM

## 2018-02-15 DIAGNOSIS — E611 Iron deficiency: Secondary | ICD-10-CM

## 2018-02-15 DIAGNOSIS — Z800 Family history of malignant neoplasm of digestive organs: Secondary | ICD-10-CM | POA: Diagnosis not present

## 2018-02-15 DIAGNOSIS — F329 Major depressive disorder, single episode, unspecified: Secondary | ICD-10-CM | POA: Diagnosis not present

## 2018-02-15 DIAGNOSIS — R05 Cough: Secondary | ICD-10-CM | POA: Diagnosis not present

## 2018-02-15 DIAGNOSIS — R059 Cough, unspecified: Secondary | ICD-10-CM

## 2018-02-15 LAB — COMPREHENSIVE METABOLIC PANEL
ALT: 10 U/L (ref 0–35)
AST: 15 U/L (ref 0–37)
Albumin: 4.2 g/dL (ref 3.5–5.2)
Alkaline Phosphatase: 53 U/L (ref 39–117)
BUN: 10 mg/dL (ref 6–23)
CO2: 25 mEq/L (ref 19–32)
Calcium: 9.2 mg/dL (ref 8.4–10.5)
Chloride: 102 mEq/L (ref 96–112)
Creatinine, Ser: 0.48 mg/dL (ref 0.40–1.20)
GFR: 139.91 mL/min (ref >60.00)
Glucose, Bld: 88 mg/dL (ref 70–99)
Potassium: 4.2 mEq/L (ref 3.5–5.1)
Sodium: 134 mEq/L — ABNORMAL LOW (ref 135–145)
Total Bilirubin: 0.3 mg/dL (ref 0.2–1.2)
Total Protein: 7.2 g/dL (ref 6.0–8.3)

## 2018-02-15 LAB — CBC
HCT: 42.6 % (ref 36.0–46.0)
Hemoglobin: 14.2 g/dL (ref 12.0–15.0)
MCHC: 33.3 g/dL (ref 30.0–36.0)
MCV: 88.3 fl (ref 78.0–100.0)
Platelets: 321 10*3/uL (ref 150.0–400.0)
RBC: 4.83 Mil/uL (ref 3.87–5.11)
RDW: 16.8 % — ABNORMAL HIGH (ref 11.5–15.5)
WBC: 6.4 10*3/uL (ref 4.0–10.5)

## 2018-02-15 MED ORDER — HYDROXYZINE HCL 25 MG PO TABS: 25.0000 mg | ORAL_TABLET | Freq: Three times a day (TID) | ORAL | 0 refills | Status: DC | PRN

## 2018-02-15 NOTE — Assessment & Plan Note (Signed)
Prior history of iron deficiency without anemia.  This appears to have been in the setting of prolonged irregular menstrual cycles.  She has not had a menstrual cycle in 10 months.  We will recheck iron levels and a CBC.

## 2018-02-15 NOTE — Assessment & Plan Note (Signed)
Discussed that this could be COPD related given her chest x-ray findings.  Discussed evaluation for this through pulmonology or with PFTs though she deferred until she gets her anxiety and depression under better control.

## 2018-02-15 NOTE — Progress Notes (Signed)
[name], MD Phone: [phone]  [name] is a 43 y.o. female who presents today for f/u.  Patient lost to follow-up for greater than 12 months and recently reestablished.  CC: Depression, anxiety, cough  Depression/anxiety: Patient has had significant issues with this since her husband passed away 10 months ago.  This was sudden and she had to take him off of life support.  She has had additional issues since then with her house being foreclosed on and losing her car.  She notes she is never happy.  She cannot get herself together to get things together to move out of her house.  She cries a lot.  She notes no SI.  Her Prozac was increased to 60 mg daily and she was started on BuSpar which she already had some at home.  She notes these have not helped.  She took the Xanax and this was not beneficial.  She wonders about getting Adderall as she was on this previously and thinks she had testing done at [facility] years ago.  She would like to do counseling as well though she wants to wait until she has her living situation and transportation situation resolved.  She repeatedly notes that she wants something that will help her symptoms immediately.  Cough: Patient previously evaluated for cough and had a chest x-ray that revealed COPD with mild hyperinflation.  She has tried Claritin with little benefit.  She has tried albuterol with little benefit.  She continues to have some cough.  She would like to hold off on evaluation for this until her depression and anxiety is under better control.  Iron deficiency: Previously noted to be iron deficient without anemia.  She has not been taking iron supplementation.  She was unable to tolerate iron supplementation.  She does report a history of anemia for many years.  On review of care everywhere it appears she has been intermittently anemic going back to 2013.  She notes no bleeding from anywhere.  She has not had a menstrual cycle in 10 months.   Previously she did have irregular cycles that based on review of prior notes that would last up to 9 days.    Family history of colon cancer: She has not had a colonoscopy previously.  She notes her brother was recently diagnosed with colon cancer.  Social History   Tobacco Use  Smoking Status Current Every Day Smoker  Smokeless Tobacco Never Used     ROS see history of present illness  Objective  Physical Exam Vitals:   02/15/18 1109  BP: 118/80  Pulse: 82  Temp: 99 F (37.2 C)  SpO2: 98%    BP Readings from Last 3 Encounters:  02/15/18 118/80  02/15/18 118/80  01/26/18 128/62   Wt Readings from Last 3 Encounters:  02/15/18 161 lb 12.8 oz (73.4 kg)  02/15/18 161 lb 12.8 oz (73.4 kg)  01/26/18 156 lb 9.6 oz (71 kg)    Physical Exam Constitutional:      General: She is not in acute distress.    Appearance: She is not diaphoretic.  Cardiovascular:     Rate and Rhythm: Normal rate and regular rhythm.     Heart sounds: Normal heart sounds.  Pulmonary:     Effort: Pulmonary effort is normal.     Breath sounds: Normal breath sounds.  Skin:    General: Skin is warm and dry.  Neurological:     Mental Status: She is alert.  Psychiatric:  Comments: Mood depressed and anxious, affect flat, significantly tearful at times during our exam      Assessment/Plan: Please see individual problem list.  Anxiety and depression Patient with significant anxiety and depression following the loss of her husband.  She does not seem to have benefited from Prozac.  We will discuss with our clinical pharmacist and transition her to an alternative medication.  She will continue on BuSpar.  She requested something to make her feel better immediately and I did have a lengthy discussion with her that there is not a medication that will accomplish that.  I advised that it takes time for these medications to work appropriately.  We will trial hydroxyzine to see if that would be beneficial.   She is given return precautions.  She will follow-up in 2 to 3 weeks.  I did discuss having her do therapy or hospice counseling though she wants to hold off on that currently until she has her housing and car situation resolved.  Cough Discussed that this could be COPD related given her chest x-ray findings.  Discussed evaluation for this through pulmonology or with PFTs though she deferred until she gets her anxiety and depression under better control.  Iron deficiency Prior history of iron deficiency without anemia.  This appears to have been in the setting of prolonged irregular menstrual cycles.  She has not had a menstrual cycle in 10 months.  We will recheck iron levels and a CBC.   Family history of colon cancer Refer to GI for colonoscopy.   Orders Placed This Encounter  Procedures  . CBC  . Iron, TIBC and Ferritin Panel  . Comp Met (CMET)  . Ambulatory referral to Gastroenterology    Referral Priority:   Routine    Referral Type:   Consultation    Referral Reason:   Specialty Services Required    Number of Visits Requested:   1    Meds ordered this encounter  Medications  . hydrOXYzine (ATARAX/VISTARIL) 25 MG tablet    Sig: Take 1 tablet (25 mg total) by mouth 3 (three) times daily as needed for anxiety.    Dispense:  30 tablet    Refill:  0     [name], MD [place]

## 2018-02-15 NOTE — Progress Notes (Signed)
Subjective:   [name] is a 43 y.o. female who presents for an Initial Medicare Annual Wellness Visit.  Review of Systems    No ROS.  Medicare Wellness Visit. Additional risk factors are reflected in the social history.        Objective:    Today's Vitals   02/15/18 1047  BP: 118/80  Pulse: 82  Resp: 16  Temp: 99 F (37.2 C)  TempSrc: Oral  SpO2: 98%  Weight: 161 lb 12.8 oz (73.4 kg)  Height: 5' 7.5" (1.715 m)   Body mass index is 24.97 kg/m.  Advanced Directives 02/15/2018  Does Patient Have a Medical Advance Directive? No  Would patient like information on creating a medical advance directive? Yes (MAU/Ambulatory/Procedural Areas - Information given)    Current Medications (verified) Outpatient Encounter Medications as of 02/15/2018  Medication Sig  . albuterol (PROVENTIL HFA;VENTOLIN HFA) 108 (90 Base) MCG/ACT inhaler Inhale 2 puffs into the lungs every 6 (six) hours as needed for wheezing or shortness of breath.  . ALPRAZolam (XANAX) 0.25 MG tablet TAKE 1 TABLET (0.25 MG TOTAL) BY MOUTH 2 (TWO) TIMES DAILY AS NEEDED FOR ANXIETY.  . busPIRone (BUSPAR) 10 MG tablet Take 1 tablet (10 mg total) by mouth 2 (two) times daily.  Marland Kitchen FLUoxetine (PROZAC) 20 MG tablet Take 3 tablets (60 mg total) by mouth daily.  Marland Kitchen loratadine (CLARITIN) 10 MG tablet Take 1 tablet (10 mg total) by mouth daily.  . [DISCONTINUED] guaiFENesin (MUCINEX) 600 MG 12 hr tablet Take 2 tablets (1,200 mg total) by mouth 2 (two) times daily.  . [DISCONTINUED] lidocaine (XYLOCAINE) 2 % jelly APPLY TO AFFECTED AREA TWICE A DAY FOR PAIN PRIOR AMPUTATION ON FEET  . [DISCONTINUED] pantoprazole (PROTONIX) 40 MG tablet Take by mouth.   No facility-administered encounter medications on file as of 02/15/2018.     Allergies (verified) Bee venom; Vancomycin; Penicillins; Sulfamethoxazole-trimethoprim; Tramadol; and Clindamycin   History: Past Medical History:  Diagnosis Date  . Allergic rhinitis   .  Anxiety   . Asthma   . Charcot-Marie-Tooth disease   . Chickenpox   . Depression   . Lisfranc dislocation   . Muscular dystrophy ([place])   . Osteomyelitis of foot ([place])   . Stress incontinence    Past Surgical History:  Procedure Laterality Date  . ECTOPIC PREGNANCY SURGERY  1995  . Great toe amputation Left April 2011  . Great toe amputation Right October 2015  . TUBAL LIGATION  2005   Family History  Problem Relation Age of Onset  . Hyperlipidemia Father   . Hypertension Father   . Sudden death Sister        Pneumonia  . Diabetes Paternal Grandmother   . Muscular dystrophy Other        Mother, grandparents, other relatives on maternal side   Social History   Socioeconomic History  . Marital status: Married    Spouse name: Not on file  . Number of children: Not on file  . Years of education: Not on file  . Highest education level: Not on file  Occupational History  . Not on file  Social Needs  . Financial resource strain: Not hard at all  . Food insecurity:    Worry: Never true    Inability: Never true  . Transportation needs:    Medical: Not on file    Non-medical: Not on file  Tobacco Use  . Smoking status: Current Every Day Smoker  . Smokeless tobacco: Never  Used  Substance and Sexual Activity  . Alcohol use: Yes    Alcohol/week: 0.0 standard drinks    Comment: infrequent  . Drug use: No  . Sexual activity: Not Currently  Lifestyle  . Physical activity:    Days per week: 0 days    Minutes per session: Not on file  . Stress: Very much  Relationships  . Social connections:    Talks on phone: Not on file    Gets together: Not on file    Attends religious service: Not on file    Active member of club or organization: Not on file    Attends meetings of clubs or organizations: Not on file    Relationship status: Not on file  Other Topics Concern  . Not on file  Social History Narrative  . Not on file    Tobacco Counseling Ready to quit: Not  Answered Counseling given: Not Answered   Clinical Intake:  Pre-visit preparation completed: Yes        Diabetes: No  How often do you need to have someone help you when you read instructions, pamphlets, or other written materials from your doctor or pharmacy?: 1 - Never  Interpreter Needed?: No      Activities of Daily Living In your present state of health, do you have any difficulty performing the following activities: 02/15/2018  Hearing? N  Vision? N  Difficulty concentrating or making decisions? N  Walking or climbing stairs? N  Dressing or bathing? N  Doing errands, shopping? N  Preparing Food and eating ? N  Using the Toilet? N  In the past six months, have you accidently leaked urine? Y  Comment Managed with a daily liner  Do you have problems with loss of bowel control? N  Managing your Medications? N  Managing your Finances? N  Housekeeping or managing your Housekeeping? N  Some recent data might be hidden     Immunizations and Health Maintenance Immunization History  Administered Date(s) Administered  . Influenza,inj,Quad PF,6+ Mos 09/17/2015, 01/26/2018   Health Maintenance Due  Topic Date Due  . HIV Screening  03/15/1988  . TETANUS/TDAP  03/15/1992  . PAP SMEAR-Modifier  03/15/1994    Patient Care Team: Leone Haven, MD as PCP - General (Family Medicine)  Indicate any recent Medical Services you may have received from other than [facility] providers in the past year (date may be approximate).     Assessment:   This is a routine wellness examination for [name].  Follow up scheduled today with pcp regarding depression.   Health Screenings  TSH-01/08/15 (1.11) Glaucoma -none Hearing -demonstrates normal hearing in conversation Cholesterol -01/08/15 (222)  Social  Alcohol intake -yes, 2-3 drinks per week Smoking history- current Smokers in home? yes Illicit drug use? none Exercise -none, she plans to try and start walking Diet  -regular Sexually Active -not currently Multiple Partners -no  Safety  Patient feels safe at home. Currently spending most of her time at her parents home. Patient does have smoke detectors at home  Patient does wear sunscreen or protective clothing when in direct sunlight  Patient does wear seat belt when driving or riding with others.   Activities of Daily Living Patient can do their own household chores. Denies needing assistance with: driving, feeding themselves, getting from bed to chair, getting to the toilet, bathing/showering, dressing, managing money, climbing flight of stairs, or preparing meals.   Depression Screen Patient reports depression today.  Denies self harm or harm  towards others.  Tears easily due to the loss of her husband and changes that have come with that.  Same day appointment to see her doctor regarding talk therapy and medication changes.   Fall Screen Patient denies being afraid of falling or falling in the last year.   Memory Screen Patient denies problems with memory, misplacing items, and is able to balance checkbook/bank accounts.  Patient is alert, normal appearance, oriented to person/place/and time. Correctly identified the president of the [place], performed simple calculations and stated the months of the year in reverse. .   Immunizations The following Immunizations are up to date: Influenza.  Other Providers Patient Care Team: Leone Haven, MD as PCP - General (Family Medicine)  Hearing/Vision screen  Visual Acuity Screening   Right eye Left eye Both eyes  Without correction:   20/50  With correction:     Hearing Screening Comments: Patient is able to hear conversational tones without difficulty.  No issues reported.   Dietary issues and exercise activities discussed: Current Exercise Habits: The patient does not participate in regular exercise at present  Goals      Patient Stated   . Maintain Healthy Lifestyle (pt-stated)      Vision exam Dental exam Eat healthy Exercise      Depression Screen PHQ 2/9 Scores 02/15/2018 10/24/2017 02/26/2015 12/26/2014  PHQ - 2 Score 5 6 4 4   PHQ- 9 Score 16 22 13 16     Fall Risk Fall Risk  02/15/2018  Falls in the past year? 0    Cognitive Function:     6CIT Screen 02/15/2018  What Year? 0 points  What month? 0 points  What time? 0 points  Count back from 20 0 points  Months in reverse 0 points  Repeat phrase 0 points  Total Score 0    Screening Tests Health Maintenance  Topic Date Due  . HIV Screening  03/15/1988  . TETANUS/TDAP  03/15/1992  . PAP SMEAR-Modifier  03/15/1994  . INFLUENZA VACCINE  Completed     Plan:    End of life planning; Advance aging; Advanced directives discussed. Copy of current HCPOA/Living Will requested upon completion.    I have personally reviewed and noted the following in the patient's chart:   . Medical and social history . Use of alcohol, tobacco or illicit drugs  . Current medications and supplements . Functional ability and status . Nutritional status . Physical activity . Advanced directives . List of other physicians . Hospitalizations, surgeries, and ER visits in previous 12 months . Vitals . Screenings to include cognitive, depression, and falls . Referrals and appointments  In addition, I have reviewed and discussed with patient certain preventive protocols, quality metrics, and best practice recommendations. A written personalized care plan for preventive services as well as general preventive health recommendations were provided to patient.     Varney Biles, LPN   6/71/2458

## 2018-02-15 NOTE — Patient Instructions (Addendum)
  Ms. Schermerhorn , Thank you for taking time to come for your Medicare Wellness Visit. I appreciate your ongoing commitment to your health goals. Please review the following plan we discussed and let me know if I can assist you in the future.   These are the goals we discussed: Goals      Patient Stated   . Maintain Healthy Lifestyle (pt-stated)     Vision exam Dental exam Eat healthy Exercise       This is a list of the screening recommended for you and due dates:  Health Maintenance  Topic Date Due  . HIV Screening  03/15/1988  . Tetanus Vaccine  03/15/1992  . Pap Smear  03/15/1994  . Flu Shot  Completed

## 2018-02-15 NOTE — Assessment & Plan Note (Signed)
Refer to GI for colonoscopy.

## 2018-02-15 NOTE — Assessment & Plan Note (Signed)
Patient with significant anxiety and depression following the loss of her husband.  She does not seem to have benefited from Prozac.  We will discuss with our clinical pharmacist and transition her to an alternative medication.  She will continue on BuSpar.  She requested something to make her feel better immediately and I did have a lengthy discussion with her that there is not a medication that will accomplish that.  I advised that it takes time for these medications to work appropriately.  We will trial hydroxyzine to see if that would be beneficial.  She is given return precautions.  She will follow-up in 2 to 3 weeks.  I did discuss having her do therapy or hospice counseling though she wants to hold off on that currently until she has her housing and car situation resolved.

## 2018-02-15 NOTE — Patient Instructions (Addendum)
Nice to see you. We are going to check with our clinical pharmacist regarding getting you onto an alternative medicine and off of the Prozac.  Please try the hydroxyzine for your anxiety.  If you develop thoughts of harming yourself please seek medical attention immediately. We will check some lab work today and contact you with the results. We will refer you to GI for colonoscopy.

## 2018-02-16 ENCOUNTER — Telehealth: Payer: Self-pay | Admitting: Family Medicine

## 2018-02-16 DIAGNOSIS — F329 Major depressive disorder, single episode, unspecified: Secondary | ICD-10-CM

## 2018-02-16 DIAGNOSIS — F419 Anxiety disorder, unspecified: Secondary | ICD-10-CM

## 2018-02-16 DIAGNOSIS — F32A Depression, unspecified: Secondary | ICD-10-CM

## 2018-02-16 LAB — IRON,TIBC AND FERRITIN PANEL
%SAT: 29 % (calc) (ref 16–45)
Ferritin: 24 ng/mL (ref 16–232)
Iron: 122 ug/dL (ref 40–190)
TIBC: 417 mcg/dL (calc) (ref 250–450)

## 2018-02-16 NOTE — Progress Notes (Signed)
I have reviewed the above note and agree.  Tylan Briguglio, M.D.  

## 2018-02-16 NOTE — Telephone Encounter (Signed)
-----   Message from De Hollingshead, [facility] sent at 02/15/2018  3:22 PM EST ----- Hi  I'd decrease fluoxetine to 40 mg daily for a week. Then, 20 mg daily for a week while starting sertraline at 50 mg. Then, stop fluoxetine and increase sertraline to 100 mg daily. See how that goes for a few weeks, but then you still have some room to titrate sertraline. Could adding on bupropion on down the road, if she needs more activation.   Catie ----- Message ----- From: Leone Haven, MD Sent: 02/15/2018   2:50 PM EST To: De Hollingshead, [place] Catie,   This patient has had anxiety and depression since the loss of her husband about 10 months ago. She has been on prozac 60 mg for the past month and has not noticed a difference. I would like to change her to zoloft though wanted to see the best way to transition off prozac and on to zoloft given she is currently on porzac. Thanks.   Randall Hiss

## 2018-02-16 NOTE — Telephone Encounter (Signed)
Called and spoke with pt. Pt advised and stated that she would like to start the ZOLOFT send to [facility] in [place] on [address].   Sent to PCP.   Pt was asking if you are willing to send in the adderall?

## 2018-02-16 NOTE — Telephone Encounter (Signed)
Please let the patient know I heard back from our clinical pharmacist.  She suggested decreasing the Prozac to 40 mg daily for 1 week.  Then we would decrease to 20 mg daily for 1 week while starting Zoloft at 50 mg daily.  Then she would discontinue the Prozac and we would increase the Zoloft to 100 mg daily.  There would still be room to go up on the Zoloft after that if there was no benefit.  If the patient is willing to make these changes I can send in the Zoloft and we can start the taper on the Prozac.

## 2018-02-17 MED ORDER — FLUOXETINE HCL 20 MG PO TABS: ORAL_TABLET | ORAL | 0 refills | Status: DC

## 2018-02-17 MED ORDER — SERTRALINE HCL 50 MG PO TABS: ORAL_TABLET | ORAL | 3 refills | Status: DC

## 2018-02-17 NOTE — Telephone Encounter (Addendum)
Please let the patient know that I sent the Zoloft to the pharmacy.  Please reinforce the instructions for tapering that are listed previously in this phone note.  I am waiting to receive records from [facility] to determine if the Adderall is appropriate.  This is not a medication I prescribe to patient's without having documentation of an evaluation for ADHD.  If she thinks she had this evaluation somewhere else we can try to request records from there as well.  Thanks.

## 2018-02-17 NOTE — Addendum Note (Signed)
Addended by: Leone Haven on: 02/17/2018 09:12 AM   Modules accepted: Orders

## 2018-02-17 NOTE — Telephone Encounter (Signed)
Spoke with patient voiced understanding to PCP note, and to tapering of prozac and zoloft.

## 2018-02-21 ENCOUNTER — Ambulatory Visit: Payer: Medicare Other | Admitting: Family Medicine

## 2018-02-22 ENCOUNTER — Encounter: Payer: Self-pay | Admitting: Family Medicine

## 2018-02-22 ENCOUNTER — Ambulatory Visit (INDEPENDENT_AMBULATORY_CARE_PROVIDER_SITE_OTHER): Payer: Medicare Other | Admitting: Family Medicine

## 2018-02-22 VITALS — BP 132/84 | HR 100 | Temp 100.2°F | Resp 18 | Ht 67.0 in | Wt 158.5 lb

## 2018-02-22 DIAGNOSIS — L03114 Cellulitis of left upper limb: Secondary | ICD-10-CM

## 2018-02-22 DIAGNOSIS — S6992XA Unspecified injury of left wrist, hand and finger(s), initial encounter: Secondary | ICD-10-CM | POA: Diagnosis not present

## 2018-02-22 MED ORDER — DOXYCYCLINE HYCLATE 100 MG PO TABS: 100.0000 mg | ORAL_TABLET | Freq: Two times a day (BID) | ORAL | 0 refills | Status: DC

## 2018-02-22 NOTE — Progress Notes (Signed)
Subjective:    Patient ID: [name], female    DOB: May 23, 1975, 43 y.o.   MRN: [id number]  HPI   Patient presents to clinic complaining of left hand/wrist pain for 2 days.  States that she accidentally hit the back of hand on corner of cabinet.  At first thought her hand was okay, but then noticed it begin to swell and become painful.  Patient got a wrist brace from [facility] and that has helped to keep her wrist and hand in a stable position.  She has used over-the-counter Tylenol with some help in reducing pain.  Today she also noticed the skin has become slightly puffy and red in color.  Patient Active Problem List   Diagnosis Date Noted  . Cough 02/15/2018  . Family history of colon cancer 02/15/2018  . Vitamin D deficiency 09/17/2015  . Muscular dystrophy ([place]) 03/26/2015  . Neuropathic pain 03/26/2015  . Chronic skin ulcer ([place]) 03/26/2015  . Irregular periods 02/26/2015  . Allergic rhinitis 02/26/2015  . Tooth pain 01/08/2015  . Anxiety and depression 12/26/2014  . Charcot-Marie-Tooth disease 12/26/2014  . Stress incontinence 12/26/2014  . Iron deficiency 12/26/2014  . Continuous opioid dependence ([place]) 09/11/2014  . Current tobacco use 07/11/2013  . Chronic pain 08/25/2012  . Foot pain 08/25/2012  . Other long term (current) drug therapy 09/30/2011  . 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 Arthritis, neuropathic 03/18/2011  . Closed dislocation of tarsometatarsal joint 03/18/2011  . Acquired deformities of toe 03/04/2011  . 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 Smoking status: Current Every Day Smoker  . Smokeless tobacco: Never Used  Substance Use Topics  . Alcohol use: Yes    Alcohol/week: 0.0 standard drinks    Comment: infrequent   Review of Systems   Constitutional: Negative for chills, fatigue and fever.  HENT: Negative for congestion, ear pain, sinus pain and sore throat.   Eyes:  Negative.   Respiratory: Negative for cough, shortness of breath and wheezing.   Cardiovascular: Negative for chest pain, palpitations and leg swelling.  Gastrointestinal: Negative for abdominal pain, diarrhea, nausea and vomiting.  Genitourinary: Negative for dysuria, frequency and urgency.  Musculoskeletal: + left wrist and left hand pain  Skin: Negative for color change, pallor and rash.  Neurological: Negative for syncope, light-headedness and headaches.  Psychiatric/Behavioral: The patient is not nervous/anxious.       Objective:   Physical Exam Vitals signs and nursing note reviewed.  Constitutional:      General: She is not in acute distress.    Appearance: She is not toxic-appearing.  HENT:     Head: Normocephalic and atraumatic.  Cardiovascular:     Rate and Rhythm: Normal rate and regular rhythm.     Pulses: Normal pulses.     Heart sounds: Normal heart sounds.     Comments: +radial and ulnar pulses bilaterally Pulmonary:     Effort: Pulmonary effort is normal. No respiratory distress.     Breath sounds: Normal breath sounds.  Musculoskeletal:       Hands:     Comments: Area of pain, redness and swelling indicated by red circle on diagram.  Patient is able to wiggle all fingers and move wrist side to side and up and down, but these motions do cause pain.  Patient is resistant to do the motions because they are painful.  Skin:    Findings: Erythema (back of left hand. Small cut  in skin on back onf hand, most likely happened when hit cabinet corner.) present.  Neurological:     Mental Status: She is alert and oriented to person, place, and time.    Today's Vitals   02/22/18 1017  BP: 132/84  Pulse: 100  Resp: 18  Temp: 100.2 F (37.9 C)  TempSrc: Oral  SpO2: 98%  Weight: 158 lb 8 oz (71.9 kg)  Height: 5\' 7"  (1.702 m)   Body mass index is 24.82 kg/m.     Assessment & Plan:    Injury to left hand-we will get x-ray of left hand and left wrist.  Currently  our x-ray machine is down due to being serviced, so patient will return to clinic later in the day to get her x-ray done.  Advised to continue wearing wrist brace that she has been doing as this provides stabilization to joint and use Tylenol as needed for pain.  Cellulitis of left hand- due to redness and warmth of skin and patient having a temperature of 100.2, I will treat for cellulitis with doxycycline 100 mg twice daily.  Patient advised to monitor area for worsening redness.  We will base next step in plan of care on x-ray results.  Patient will return to clinic early next week for recheck on cellulitis.

## 2018-02-22 NOTE — Patient Instructions (Signed)
Currently our xray machine is being serviced  You can return to clinic later today to get xray  Or you can go to walk in clinic [facility] if you choose

## 2018-02-27 ENCOUNTER — Encounter: Payer: Self-pay | Admitting: *Deleted

## 2018-03-02 ENCOUNTER — Telehealth: Payer: Self-pay

## 2018-03-02 ENCOUNTER — Other Ambulatory Visit: Payer: Self-pay | Admitting: Family Medicine

## 2018-03-02 DIAGNOSIS — F32A Depression, unspecified: Secondary | ICD-10-CM

## 2018-03-02 DIAGNOSIS — F419 Anxiety disorder, unspecified: Secondary | ICD-10-CM

## 2018-03-02 DIAGNOSIS — F329 Major depressive disorder, single episode, unspecified: Secondary | ICD-10-CM

## 2018-03-02 NOTE — Telephone Encounter (Signed)
Called and spoke with patient to advise that I did get her message and I will let Dr. Caryl Bis know that she is tapering of the Prozac as advised to do so and [name] is wondering what the alternative will be.   Advised [name] that the doxycycline was Rx'd due to cellulitis per Philis Nettle notes from [place] on 02/22/2018 [name] voiced understanding.

## 2018-03-02 NOTE — Telephone Encounter (Signed)
Copied from [place] #[id number]. Topic: General - Other >> Mar 02, 2018  8:30 AM Judyann Munson wrote: Reason for CRM:  patient is calling to advise she has stop the medication  FLUoxetine (PROZAC) 20 MG tablet as advise from [facility]. and she would like to know what alterative  she will be taking. She is also wanting to know why she was prescribe doxycycline (VIBRA-TABS) 100 MG tablet. The patient would like a call back. Please advise

## 2018-03-02 NOTE — Telephone Encounter (Signed)
The hydroxyzine was supposed to take place of the Xanax for her anxiety.  Please find out if the hydroxyzine has been beneficial.  The Xanax is not a long-term medication and if the hydroxyzine has been beneficial we will stick with that.

## 2018-03-02 NOTE — Telephone Encounter (Signed)
I sent Zoloft to her pharmacy previously.  Please find out if she picked this up from the pharmacy.  If she has come off of the Prozac she can start on the Zoloft as prescribed at the pharmacy.  Thanks.

## 2018-03-02 NOTE — Telephone Encounter (Signed)
Called and spoke with pt. Pt advised. Pt has not picked up the Rx for zoloft but will plan to pick it up on Saturday since she should be done with taking her Prozac by then.

## 2018-03-03 NOTE — Telephone Encounter (Signed)
Short term refill sent to pharmacy

## 2018-03-10 ENCOUNTER — Telehealth: Payer: Self-pay | Admitting: Gastroenterology

## 2018-03-10 NOTE — Telephone Encounter (Signed)
Patient called to schedule a colonoscopy has referral. She was sent a letter that we could not get in touch with her. I have update her phone #'s.

## 2018-03-13 ENCOUNTER — Telehealth: Payer: Self-pay | Admitting: Gastroenterology

## 2018-03-13 NOTE — Telephone Encounter (Signed)
LVM returning patients call to schedule her colonoscopy.  Thanks Jesten Cappuccio 

## 2018-03-13 NOTE — Telephone Encounter (Signed)
LVM returning patients call to reschedule her colonoscopy.  Thanks [employer]

## 2018-03-13 NOTE — Telephone Encounter (Signed)
Pt is returning a call for Michelle °

## 2018-05-08 ENCOUNTER — Ambulatory Visit (INDEPENDENT_AMBULATORY_CARE_PROVIDER_SITE_OTHER): Payer: Medicare Other | Admitting: Family Medicine

## 2018-05-08 ENCOUNTER — Encounter: Payer: Self-pay | Admitting: Family Medicine

## 2018-05-08 ENCOUNTER — Other Ambulatory Visit: Payer: Self-pay

## 2018-05-08 ENCOUNTER — Ambulatory Visit: Payer: Self-pay | Admitting: *Deleted

## 2018-05-08 ENCOUNTER — Other Ambulatory Visit: Payer: Self-pay | Admitting: Family Medicine

## 2018-05-08 ENCOUNTER — Telehealth: Payer: Self-pay | Admitting: Family Medicine

## 2018-05-08 DIAGNOSIS — F329 Major depressive disorder, single episode, unspecified: Secondary | ICD-10-CM | POA: Diagnosis not present

## 2018-05-08 DIAGNOSIS — R41840 Attention and concentration deficit: Secondary | ICD-10-CM | POA: Diagnosis not present

## 2018-05-08 DIAGNOSIS — J309 Allergic rhinitis, unspecified: Secondary | ICD-10-CM | POA: Diagnosis not present

## 2018-05-08 DIAGNOSIS — F419 Anxiety disorder, unspecified: Secondary | ICD-10-CM

## 2018-05-08 DIAGNOSIS — F32A Depression, unspecified: Secondary | ICD-10-CM

## 2018-05-08 MED ORDER — FLUTICASONE PROPIONATE 50 MCG/ACT NA SUSP: 2.0000 | Freq: Every day | NASAL | 6 refills | Status: DC

## 2018-05-08 MED ORDER — CLONAZEPAM 0.5 MG PO TABS: 0.2500 mg | ORAL_TABLET | Freq: Two times a day (BID) | ORAL | 0 refills | Status: DC | PRN

## 2018-05-08 MED ORDER — SERTRALINE HCL 50 MG PO TABS: 150.0000 mg | ORAL_TABLET | Freq: Every day | ORAL | 1 refills | Status: DC

## 2018-05-08 NOTE — Assessment & Plan Note (Signed)
Patient reports attention issues.  I discussed that this could be contributed to by her anxiety and depression.  I advised her that we had not received any records from [facility] and that I would require her being retested or for [name] to obtain records prior to prescribing Adderall or any related medication.  I did discuss that if her anxiety and depression improved this issue may improve as well.  We will refer her for ADHD testing.

## 2018-05-08 NOTE — Progress Notes (Signed)
Virtual Visit via video Note  This visit type was conducted due to national recommendations for restrictions regarding the COVID-19 pandemic (e.g. social distancing).  This format is felt to be most appropriate for this patient at this time.  All issues noted in this document were discussed and addressed.  No physical exam was performed (except for noted visual exam findings with Video Visits).   I connected with [name] on 05/08/18 at  3:30 PM EDT by a video enabled telemedicine application and verified that I am speaking with the correct person using two identifiers. Location patient: home Location provider: work Persons participating in the virtual visit: patient, provider  I discussed the limitations, risks, security and privacy concerns of performing an evaluation and management service by telephone and the availability of in person appointments. I also discussed with the patient that there may be a patient responsible charge related to this service. The patient expressed understanding and agreed to proceed.  Reason for visit: same day visit  HPI: Anxiety/depression/attention deficit: Patient notes she continues to have some anxiety.  She does note some depression at times.  She notes no energy.  She is not able to focus.  Some days are better than others.  She does note that she has had a hard time as her husband passed away 13 months ago.  She had to take him off of life support.  She feels that nobody understands what she is having to go through.  She has had her house foreclosed on and her car repossessed.  She does have an online support group which has been beneficial.  She notes there has been no change to how she is feeling with regards to this.  No SI.  She does feel that she would benefit from being on a medication like Adderall for attention deficit disorder.  She notes she was on this previously and has been tested in the past.  She wonders if she can be prescribed this as she  thinks this will be beneficial.  Allergies: Patient notes her symptoms started 2 or so weeks ago.  She notes they occur typically this time every year.  She notes itchy watery eyes.  She has had some sneezing.  She notes some postnasal drip.  She is blowing some mucus out of her nose.  No fevers, chills, body aches, or ear pain.  She notes minimal cough that is minimally productive.  No shortness of breath though does have some trouble getting a full breath and at times.  She has not been using her inhaler.  She has been taking Claritin.  No travel or COVID-19 exposure.   ROS: See pertinent positives and negatives per HPI.  Past Medical History:  Diagnosis Date  . Allergic rhinitis   . Anxiety   . Asthma   . Charcot-Marie-Tooth disease   . Chickenpox   . Depression   . Lisfranc dislocation   . Muscular dystrophy ([place])   . Osteomyelitis ([place]) 07/16/2013  . Osteomyelitis of foot ([place])   . Stress incontinence     Past Surgical History:  Procedure Laterality Date  . ECTOPIC PREGNANCY SURGERY  1995  . Great toe amputation Left April 2011  . Great toe amputation Right October 2015  . TUBAL LIGATION  2005    Family History  Problem Relation Age of Onset  . Hyperlipidemia Father   . Hypertension Father   . Sudden death Sister        Pneumonia  . Diabetes Paternal  Grandmother   . Muscular dystrophy Other        Mother, grandparents, other relatives on maternal side    SOCIAL HX: Smoker   Current Outpatient Medications:  .  albuterol (PROVENTIL HFA;VENTOLIN HFA) 108 (90 Base) MCG/ACT inhaler, Inhale 2 puffs into the lungs every 6 (six) hours as needed for wheezing or shortness of breath., Disp: 1 Inhaler, Rfl: 0 .  hydrOXYzine (ATARAX/VISTARIL) 25 MG tablet, TAKE 1 TABLET (25 MG TOTAL) BY MOUTH 3 (THREE) TIMES DAILY AS NEEDED FOR ANXIETY., Disp: 30 tablet, Rfl: 0 .  loratadine (CLARITIN) 10 MG tablet, Take 1 tablet (10 mg total) by mouth daily., Disp: 30 tablet, Rfl: 5 .   sertraline (ZOLOFT) 50 MG tablet, Take 3 tablets (150 mg total) by mouth daily., Disp: 270 tablet, Rfl: 1 .  clonazePAM (KLONOPIN) 0.5 MG tablet, Take 0.5 tablets (0.25 mg total) by mouth 2 (two) times daily as needed for anxiety., Disp: 10 tablet, Rfl: 0 .  fluticasone (FLONASE) 50 MCG/ACT nasal spray, Place 2 sprays into both nostrils daily., Disp: 16 g, Rfl: 6  EXAM:  VITALS per patient if applicable: None.  GENERAL: alert, oriented, appears well, patient is tearful when discussing her anxiety and depression as well as her late husband  HEENT: atraumatic, conjunctiva clear, no obvious abnormalities on inspection of external nose and ears  NECK: normal movements of the head and neck  LUNGS: on inspection no signs of respiratory distress, breathing rate appears normal, no obvious gross SOB, gasping or wheezing  CV: no obvious cyanosis  MS: moves all visible extremities without noticeable abnormality  PSYCH/NEURO: pleasant and cooperative, no obvious depression or anxiety, speech and thought processing grossly intact  ASSESSMENT AND PLAN:  Discussed the following assessment and plan:  Anxiety and depression - Plan: Ambulatory referral to Psychology  Attention deficit - Plan: Ambulatory referral to Psychology  Allergic rhinitis, unspecified seasonality, unspecified trigger  Allergic rhinitis Symptoms are consistent with allergic rhinitis.  She will continue Claritin.  We will add Flonase.  Discussed appropriate use of albuterol if needed.  Given return precautions.  Anxiety and depression Patient continues to have symptoms of anxiety and depression.  This potentially could be contributing to her attention deficit issues.  I discussed having her see a therapist for counseling.  A referral was placed.  We will increase her Zoloft.  I discussed prescribing a small dosage and quantity of Klonopin to take only if absolutely needed.  Discussed risk of this medication.  She is given  return precautions.  Attention deficit Patient reports attention issues.  I discussed that this could be contributed to by her anxiety and depression.  I advised her that we had not received any records from [name] and that I would require her being retested or for [name] to obtain records prior to prescribing Adderall or any related medication.  I did discuss that if her anxiety and depression improved this issue may improve as well.  We will refer her for ADHD testing.    I discussed the assessment and treatment plan with the patient. The patient was provided an opportunity to ask questions and all were answered. The patient agreed with the plan and demonstrated an understanding of the instructions.   The patient was advised to call back or seek an in-person evaluation if the symptoms worsen or if the condition fails to improve as anticipated.   [name], MD

## 2018-05-08 NOTE — Telephone Encounter (Signed)
I placed a referral for psychology.  Could you please contact our behavioral health department and see if you could get her scheduled for a therapy visit and for an ADHD evaluation?  Thanks.

## 2018-05-08 NOTE — Telephone Encounter (Signed)
Please call the patient and get her set up for 53-month follow-up in the office.

## 2018-05-08 NOTE — Assessment & Plan Note (Signed)
Patient continues to have symptoms of anxiety and depression.  This potentially could be contributing to her attention deficit issues.  I discussed having her see a therapist for counseling.  A referral was placed.  We will increase her Zoloft.  I discussed prescribing a small dosage and quantity of Klonopin to take only if absolutely needed.  Discussed risk of this medication.  She is given return precautions.

## 2018-05-08 NOTE — Assessment & Plan Note (Signed)
Symptoms are consistent with allergic rhinitis.  She will continue Claritin.  We will add Flonase.  Discussed appropriate use of albuterol if needed.  Given return precautions.

## 2018-05-08 NOTE — Telephone Encounter (Signed)
Calls with allergies symptoms of runny nose and congestion/chest tightness with not getting a good breath in but not struggling to breath. Occasional cough with thick greenish phlegm. No fever/ear pain/throat pain. No headache over the last few days.Been occurring over the last 2 weeks takes claritin daily and uses neti pot that helps. Talking over medications she began crying mentioned she is struggling with the sudden loss of her husband last year. Cries daily was on .25 xanax that helped somewhat more than the hydroxyzine which does not help. Zoloft she feels has stopped helping her anxiety. Has not seen a Social worker. Routing to PCP for virtual appointment. Support given with encouragement to talk to a professional.   Reason for Disposition . [1] Taking antihistamines > 2 days AND [2] nasal allergy symptoms interfere with sleep, school, or work  Answer Assessment - Initial Assessment Questions 1. SYMPTOM: "What's the main symptom you're concerned about?" (e.g., runny nose, stuffiness, sneezing, itching)     Nasal congestion runny nose sneezing. 2. SEVERITY: "How bad is it?" "What does it keep you from doing?" (e.g., sleeping, working)      nothing 3. EYES: "Are the eyes also red, watery, and itchy?"     Itchy and water eyes 4. TRIGGER: "What pollen or other allergic substance do you think is causing the symptoms?"      unsure 5. TREATMENT: "What medicine are you using?" "What medicine worked best in the past?"     claritin 6. OTHER SYMPTOMS: "Do you have any other symptoms?" (e.g., coughing, difficulty breathing, wheezing)     Coughs up some hard, thick greenish phlegm-minimally. 7. PREGNANCY: "Is there any chance you are pregnant?" "When was your last menstrual period?"     na  Protocols used: NASAL ALLERGIES (HAY FEVER)-A-AH

## 2018-05-08 NOTE — Telephone Encounter (Signed)
Pt is scheduled for virtual appt with PCP @ 3:30 for today.

## 2018-05-08 NOTE — Telephone Encounter (Signed)
Requested medication (s) are due for refill today - yes  Requested medication (s) are on the active medication list -yes  Future visit scheduled -yes  Last refill: 03/03/18  Notes to clinic: Patient is requesting refill of non delegated Rx- sent for PCP review  Requested Prescriptions  Pending Prescriptions Disp Refills   ALPRAZolam (XANAX) 0.25 MG tablet 20 tablet 0    Sig: Take 1 tablet (0.25 mg total) by mouth 2 (two) times daily as needed for anxiety.     Not Delegated - Psychiatry:  Anxiolytics/Hypnotics Failed - 05/08/2018  9:31 AM      Failed - This refill cannot be delegated      Failed - Urine Drug Screen completed in last 360 days.      Passed - Valid encounter within last 6 months    Recent Outpatient Visits          2 months ago Hand injuries, left, initial encounter   [place] Guse, Jacquelynn Cree, FNP   2 months ago Anxiety and depression   [place], Angela Adam, MD   3 months ago Anxiety and depression   [facility], Jacquelynn Cree, FNP   6 months ago Productive cough   [facility] [place], Yvetta Coder, FNP   6 months ago Acute bronchitis, unspecified organism   [place] [facility]use, Jacquelynn Cree, FNP      Future Appointments            In 9 months O[place]Brien[place]Blaney, [place], LPN [place], [place]   In 9 months Caryl Bis, Angela Adam, MD [place], [facility]            Requested Prescriptions  Pending Prescriptions Disp Refills   ALPRAZolam (XANAX) 0.25 MG tablet 20 tablet 0    Sig: Take 1 tablet (0.25 mg total) by mouth 2 (two) times daily as needed for anxiety.     Not Delegated - Psychiatry:  Anxiolytics/Hypnotics Failed - 05/08/2018  9:31 AM      Failed - This refill cannot be delegated      Failed - Urine Drug Screen completed in last 360 days.      Passed - Valid encounter within last 6 months    Recent Outpatient  Visits          2 months ago Hand injuries, left, initial encounter   [place] [facility], Jacquelynn Cree, FNP   2 months ago Anxiety and depression   [place], MD   3 months ago Anxiety and depression   [place], FNP   6 months ago Productive cough   [facility] [place], Yvetta Coder, FNP   6 months ago Acute bronchitis, unspecified organism   [place] Guse, Jacquelynn Cree, FNP      Future Appointments            In 9 months O'Brien-Blaney, [place], LPN [place], [place]   In 9 months Caryl Bis, Angela Adam, MD [facility], [facility]

## 2018-05-09 NOTE — Telephone Encounter (Signed)
Patient notified and said this correct the clonazepam.

## 2018-05-09 NOTE — Telephone Encounter (Signed)
Called pt and left a VM to call back to be scheduled for a 2 month f/u CRM created and sent to [facility] pool.

## 2018-05-09 NOTE — Telephone Encounter (Signed)
Ok thank you 

## 2018-05-09 NOTE — Telephone Encounter (Signed)
Yes that's fine 

## 2018-05-09 NOTE — Telephone Encounter (Signed)
[name] is it OK if I send you these messages for scheduling?

## 2018-05-09 NOTE — Telephone Encounter (Signed)
I sent in clonazepam for her yesterday. I discussed that the clonazepam was my preferred medication for her at this time and that it would be a short term supply and short term treatment. It should be at her pharmacy.

## 2018-05-11 ENCOUNTER — Other Ambulatory Visit: Payer: Self-pay | Admitting: Family Medicine

## 2018-05-30 ENCOUNTER — Telehealth: Payer: Self-pay | Admitting: Family Medicine

## 2018-05-30 ENCOUNTER — Other Ambulatory Visit: Payer: Self-pay

## 2018-05-30 ENCOUNTER — Encounter: Payer: Self-pay | Admitting: Family Medicine

## 2018-05-30 ENCOUNTER — Ambulatory Visit (INDEPENDENT_AMBULATORY_CARE_PROVIDER_SITE_OTHER): Payer: Medicare Other | Admitting: Family Medicine

## 2018-05-30 DIAGNOSIS — Z800 Family history of malignant neoplasm of digestive organs: Secondary | ICD-10-CM | POA: Diagnosis not present

## 2018-05-30 DIAGNOSIS — F329 Major depressive disorder, single episode, unspecified: Secondary | ICD-10-CM | POA: Diagnosis not present

## 2018-05-30 DIAGNOSIS — F419 Anxiety disorder, unspecified: Secondary | ICD-10-CM | POA: Diagnosis not present

## 2018-05-30 DIAGNOSIS — F32A Depression, unspecified: Secondary | ICD-10-CM

## 2018-05-30 DIAGNOSIS — R5382 Chronic fatigue, unspecified: Secondary | ICD-10-CM

## 2018-05-30 MED ORDER — CLONAZEPAM 0.5 MG PO TABS: 0.2500 mg | ORAL_TABLET | Freq: Two times a day (BID) | ORAL | 0 refills | Status: DC | PRN

## 2018-05-30 NOTE — Assessment & Plan Note (Addendum)
Continues to have issues with depression.  We will work with our clinical pharmacist to transition the patient from Zoloft to [place].  We will refill her clonazepam.  We will refer her again for therapy.  She knows that they will be trying to contact her and she notes that she has cleared out her voicemail.  She is given return precautions.  Follow-up in 4 to 6 weeks.  Labs ordered to evaluate for other causes of fatigue.

## 2018-05-30 NOTE — Assessment & Plan Note (Signed)
I will send a message to the [place] at [facility] to try to contact the patient again.

## 2018-05-30 NOTE — Telephone Encounter (Addendum)
Please contact the patient and get her set up for follow-up in 4-6 weeks. Please also get her set up for labs in the next week or so. Please also let the patient know that I attempted to order a b12 and vitamin D test though it stated that these were not covered by medicare for the diagnoses that I attempted to use. We can order them though she would have to sign a waiver stating that she would be responsible for the cost of these tests if her insurance does not cover them.

## 2018-05-30 NOTE — Progress Notes (Signed)
Virtual Visit via video Note  This visit type was conducted due to national recommendations for restrictions regarding the COVID-19 pandemic (e.g. social distancing).  This format is felt to be most appropriate for this patient at this time.  All issues noted in this document were discussed and addressed.  No physical exam was performed (except for noted visual exam findi below and a can you hear me headache Ngs with Video Visits).   I connected with [name] today at  1:15 PM EDT by a video enabled telemedicine application and verified that I am speaking with the correct person using two identifiers. Location patient: home Location provider: work Persons participating in the virtual visit: patient, provider  I discussed the limitations, risks, security and privacy concerns of performing an evaluation and management service by telephone and the availability of in person appointments. I also discussed with the patient that there may be a patient responsible charge related to this service. The patient expressed understanding and agreed to proceed.   Reason for visit: follow-up  HPI: Depression: Patient notes this is not any better.  She does have some days that are better than others.  She is sad and has no energy.  She does.  She feels tired.  She does not feel that the Zoloft has helped at all.  She is currently taking Zoloft 150 mg daily.  She notes for the last several weeks she has been taking Remeron 2-3 times per week at night.  She previously got this prescription from a psychiatrist.  She takes the Klonopin less than once a day.  She notes no SI.  She does note she has been sleeping a lot and wakes up at night a lot.  She will go to bed at 6 PM and wake up at 3 or 4 AM at times.  She notes she did not get a call or message from behavioral health though it does appear that they did attempt to contact her.  Colon cancer screening: Patient notes she tried to call GI back to get a  colonoscopy set up.  She reports she did not receive a call back.  ROS: See pertinent positives and negatives per HPI.  Past Medical History:  Diagnosis Date   Allergic rhinitis    Anxiety    Asthma    Charcot-Marie-Tooth disease    Chickenpox    Depression    Lisfranc dislocation    Muscular dystrophy ([place])    Osteomyelitis ([place]) 07/16/2013   Osteomyelitis of foot ([place])    Stress incontinence     Past Surgical History:  Procedure Laterality Date   ECTOPIC PREGNANCY SURGERY  1995   Great toe amputation Left April 2011   Great toe amputation Right October 2015   TUBAL LIGATION  2005    Family History  Problem Relation Age of Onset   Hyperlipidemia Father    Hypertension Father    Sudden death Sister        Pneumonia   Diabetes Paternal Grandmother    Muscular dystrophy Other        Mother, grandparents, other relatives on maternal side    SOCIAL HX: Smoker.   Current Outpatient Medications:    albuterol (PROVENTIL HFA;VENTOLIN HFA) 108 (90 Base) MCG/ACT inhaler, Inhale 2 puffs into the lungs every 6 (six) hours as needed for wheezing or shortness of breath., Disp: 1 Inhaler, Rfl: 0   clonazePAM (KLONOPIN) 0.5 MG tablet, Take 0.5 tablets (0.25 mg total) by mouth 2 (two)  times daily as needed for anxiety., Disp: 10 tablet, Rfl: 0   fluticasone (FLONASE) 50 MCG/ACT nasal spray, Place 2 sprays into both nostrils daily., Disp: 16 g, Rfl: 6   loratadine (CLARITIN) 10 MG tablet, Take 1 tablet (10 mg total) by mouth daily., Disp: 30 tablet, Rfl: 5   mirtazapine (REMERON) 15 MG tablet, TAKE 1 TABLET BY MOUTH EVERYDAY AT BEDTIME, Disp: , Rfl:    sertraline (ZOLOFT) 50 MG tablet, TAKE 1 TABLET BY MOUTH EVERY DAY THEN INCREASE TO TAKE 2 TABLETS BY MOUTH EVERY DAY, Disp: 60 tablet, Rfl: 3   hydrOXYzine (ATARAX/VISTARIL) 25 MG tablet, TAKE 1 TABLET (25 MG TOTAL) BY MOUTH 3 (THREE) TIMES DAILY AS NEEDED FOR ANXIETY. (Patient not taking: Reported on  05/30/2018), Disp: 30 tablet, Rfl: 0  EXAM:  VITALS per patient if applicable: None  GENERAL: alert, appears well, intermittently tearful  HEENT: atraumatic, conjunttiva clear, no obvious abnormalities on inspection of external nose and ears  NECK: normal movements of the head and neck  LUNGS: on inspection no signs of respiratory distress, breathing rate appears normal, no obvious gross SOB, gasping or wheezing  CV: no obvious cyanosis  MS: moves all visible extremities without noticeable abnormality  PSYCH/NEURO: pleasant and cooperative, speech and thought processing grossly intact, affect flat, appears depressed  ASSESSMENT AND PLAN:  Discussed the following assessment and plan:  Chronic fatigue - Plan: TSH, Comp Met (CMET)  Anxiety and depression - Plan: Ambulatory referral to Psychology  Family history of colon cancer  Anxiety and depression Continues to have issues with depression.  We will work with our clinical pharmacist to transition the patient from Zoloft to [place].  We will refill her clonazepam.  We will refer her again for therapy.  She knows that they will be trying to contact her and she notes that she has cleared out her voicemail.  She is given return precautions.  Follow-up in 4 to 6 weeks.  Labs ordered to evaluate for other causes of fatigue.  Family history of colon cancer I will send a message to the CMA at GI to try to contact the patient again.    I discussed the assessment and treatment plan with the patient. The patient was provided an opportunity to ask questions and all were answered. The patient agreed with the plan and demonstrated an understanding of the instructions.   The patient was advised to call back or seek an in-person evaluation if the symptoms worsen or if the condition fails to improve as anticipated.   [name], MD

## 2018-05-30 NOTE — Telephone Encounter (Signed)
Delight Stare,   I spoke with Mrs Jeppsen today regarding her need for colonoscopy. She notes she is willing to proceed and has cleared her voicemail out so she should be easier to get in touch with. I wanted to see if you could contact her again to help arrange her colonoscopy. Thanks for your help.  [name], MD

## 2018-05-31 ENCOUNTER — Telehealth: Payer: Self-pay

## 2018-05-31 ENCOUNTER — Telehealth: Payer: Self-pay | Admitting: Gastroenterology

## 2018-05-31 ENCOUNTER — Other Ambulatory Visit: Payer: Self-pay

## 2018-05-31 DIAGNOSIS — Z800 Family history of malignant neoplasm of digestive organs: Secondary | ICD-10-CM

## 2018-05-31 DIAGNOSIS — Z01812 Encounter for preprocedural laboratory examination: Secondary | ICD-10-CM

## 2018-05-31 MED ORDER — PEG-KCL-NACL-NASULF-NA ASC-C 100 G PO SOLR: 1.0000 | Freq: Once | ORAL | 0 refills | Status: AC

## 2018-05-31 NOTE — Telephone Encounter (Signed)
Gastroenterology Pre-Procedure Review  Request Date: 06/27/18 Requesting Physician: Dr. Allen Norris  PATIENT REVIEW QUESTIONS: The patient responded to the following health history questions as indicated:    1. Are you having any GI issues? Change in bowel habits loose stool  2. Do you have a personal history of Polyps? No 3. Do you have a family history of Colon Cancer or Polyps? Brother Colon Cancer 4. Diabetes Mellitus?No 5. Joint replacements in the past 12 months?No 6. Major health problems in the past 3 months?No 7. Any artificial heart valves, MVP, or defibrillator?No    MEDICATIONS & ALLERGIES:    Patient reports the following regarding taking any anticoagulation/antiplatelet therapy:   Plavix, Coumadin, Eliquis, Xarelto, Lovenox, Pradaxa, Brilinta, or Effient? No AspirinNo  Patient confirms/reports the following medications:  Current Outpatient Medications  Medication Sig Dispense Refill  . albuterol (PROVENTIL HFA;VENTOLIN HFA) 108 (90 Base) MCG/ACT inhaler Inhale 2 puffs into the lungs every 6 (six) hours as needed for wheezing or shortness of breath. 1 Inhaler 0  . clonazePAM (KLONOPIN) 0.5 MG tablet Take 0.5 tablets (0.25 mg total) by mouth 2 (two) times daily as needed for anxiety. 10 tablet 0  . fluticasone (FLONASE) 50 MCG/ACT nasal spray Place 2 sprays into both nostrils daily. 16 g 6  . hydrOXYzine (ATARAX/VISTARIL) 25 MG tablet TAKE 1 TABLET (25 MG TOTAL) BY MOUTH 3 (THREE) TIMES DAILY AS NEEDED FOR ANXIETY. (Patient not taking: Reported on 05/30/2018) 30 tablet 0  . loratadine (CLARITIN) 10 MG tablet Take 1 tablet (10 mg total) by mouth daily. 30 tablet 5  . mirtazapine (REMERON) 15 MG tablet TAKE 1 TABLET BY MOUTH EVERYDAY AT BEDTIME    . sertraline (ZOLOFT) 50 MG tablet TAKE 1 TABLET BY MOUTH EVERY DAY THEN INCREASE TO TAKE 2 TABLETS BY MOUTH EVERY DAY 60 tablet 3   No current facility-administered medications for this visit.     Patient confirms/reports the following  allergies:  Allergies  Allergen Reactions  . Bee Venom Swelling  . Vancomycin Shortness Of Breath  . Penicillins     Other reaction(s): RASH  . Sulfamethoxazole-Trimethoprim Itching  . Tramadol Nausea Only    Other reaction(s): NAUSEA  . Clindamycin Nausea Only    And heartburn    No orders of the defined types were placed in this encounter.   AUTHORIZATION INFORMATION Primary Insurance: 1D#: Group #:  Secondary Insurance: 1D#: Group #:  SCHEDULE INFORMATION: Date: 06/27/18 Time: Location:

## 2018-05-31 NOTE — Telephone Encounter (Signed)
LVM for pt to call office to schedule her colonoscopy.  Referral reason: family history of colon cancer.  Thanks [employer]

## 2018-05-31 NOTE — Telephone Encounter (Signed)
Patient is returning [name]'s call to schedule colonoscopy.

## 2018-06-01 ENCOUNTER — Ambulatory Visit (INDEPENDENT_AMBULATORY_CARE_PROVIDER_SITE_OTHER): Payer: Medicare Other | Admitting: Psychology

## 2018-06-01 DIAGNOSIS — F4321 Adjustment disorder with depressed mood: Secondary | ICD-10-CM

## 2018-06-01 DIAGNOSIS — F411 Generalized anxiety disorder: Secondary | ICD-10-CM

## 2018-06-02 NOTE — Telephone Encounter (Signed)
Appointments have been scheduled.

## 2018-06-06 ENCOUNTER — Ambulatory Visit (INDEPENDENT_AMBULATORY_CARE_PROVIDER_SITE_OTHER): Payer: Medicare Other | Admitting: Psychology

## 2018-06-06 DIAGNOSIS — F4321 Adjustment disorder with depressed mood: Secondary | ICD-10-CM | POA: Diagnosis not present

## 2018-06-08 ENCOUNTER — Other Ambulatory Visit: Payer: Medicare Other

## 2018-06-12 ENCOUNTER — Other Ambulatory Visit: Payer: Self-pay | Admitting: Family Medicine

## 2018-06-12 DIAGNOSIS — S8992XA Unspecified injury of left lower leg, initial encounter: Secondary | ICD-10-CM | POA: Diagnosis not present

## 2018-06-12 DIAGNOSIS — S82832A Other fracture of upper and lower end of left fibula, initial encounter for closed fracture: Secondary | ICD-10-CM | POA: Diagnosis not present

## 2018-06-12 DIAGNOSIS — G600 Hereditary motor and sensory neuropathy: Secondary | ICD-10-CM | POA: Diagnosis not present

## 2018-06-12 DIAGNOSIS — W010XXA Fall on same level from slipping, tripping and stumbling without subsequent striking against object, initial encounter: Secondary | ICD-10-CM | POA: Diagnosis not present

## 2018-06-13 ENCOUNTER — Encounter: Payer: Self-pay | Admitting: Family Medicine

## 2018-06-13 ENCOUNTER — Telehealth: Payer: Self-pay | Admitting: Family Medicine

## 2018-06-13 NOTE — Telephone Encounter (Signed)
Noted.  Picture reviewed.  Appears similar to the description noted in the note from [place] [facility].  She needs to keep her appointment with the nurse practitioner tomorrow to determine appropriate pain management.  She will likely need to see podiatry urgently.  I will forward to Joycelyn Schmid so she is aware.

## 2018-06-13 NOTE — Telephone Encounter (Signed)
Pt broke her leg over the weekend. The pain medication that the ED gave her is not helping. She wanted Dr. Caryl Bis to give her something stronger. She also wanted Dr. Caryl Bis to look are her leg xrays.

## 2018-06-13 NOTE — Telephone Encounter (Signed)
Sorry to hear about her breaking her leg. I do not have access to the x-rays as they were done through [facility]. She will have to be seen to consider medication for the pain. It additionally appears that they referred her to podiatry. Please see if she heard anything from them regarding a referral. Thanks.

## 2018-06-13 NOTE — Telephone Encounter (Signed)
Refilled: 05/30/2018 #10 Last OV: 05/30/2018 Next OV: 07/14/2018

## 2018-06-13 NOTE — Telephone Encounter (Signed)
Patient has Virtual with NP for 06/14/18, patient has sent PCP picture of her foot through my chart , and no she has not heard from podiatry yet.  Patient says she has applied ice and still cannot keep from crying it hurts so bad.

## 2018-06-13 NOTE — Telephone Encounter (Signed)
I have reviewed the image.  She has an appointment scheduled for tomorrow.  She needs to be evaluated to determine appropriate pain treatment.

## 2018-06-14 ENCOUNTER — Telehealth: Payer: Self-pay | Admitting: *Deleted

## 2018-06-14 ENCOUNTER — Encounter: Payer: Self-pay | Admitting: Family

## 2018-06-14 ENCOUNTER — Ambulatory Visit: Admitting: Family

## 2018-06-14 ENCOUNTER — Telehealth: Payer: Self-pay

## 2018-06-14 ENCOUNTER — Ambulatory Visit (INDEPENDENT_AMBULATORY_CARE_PROVIDER_SITE_OTHER): Payer: Medicare Other | Admitting: Family

## 2018-06-14 DIAGNOSIS — M79672 Pain in left foot: Secondary | ICD-10-CM | POA: Diagnosis not present

## 2018-06-14 DIAGNOSIS — S82892A Other fracture of left lower leg, initial encounter for closed fracture: Secondary | ICD-10-CM | POA: Insufficient documentation

## 2018-06-14 NOTE — Patient Instructions (Addendum)
I am concerned in regard to your level of pain and swelling. I want you to be seen today in regards to swelling to ensure no blood clot, compartment syndrome , infection.   Please stay on ibuprofen around the clock and take with food. Icing and elevation are extremely important to reduce swelling.  Please go to walk in orthopedic clinic TODAY as we discussed.   Information below:  [facility] [address]  M-F 1-7:30pm  [phone]   Please call with any concerns.   Stay safe.

## 2018-06-14 NOTE — Telephone Encounter (Signed)
Called patient she did not want to cancel appointment she wanted appointment moved up. Due to pain in broken foot patient crying on phone.

## 2018-06-14 NOTE — Telephone Encounter (Signed)
Copied from [place] [id number]. Topic: General - Other >> Jun 14, 2018  7:27 AM Keene Breath wrote: Reason for CRM: Patient called to ask if there was an earlier appt. than her scheduled appt. for 2:30 today.  Please call patient if she can come in earlier.  CB# [phone] Moved appointment to 1:15

## 2018-06-14 NOTE — Progress Notes (Signed)
This visit type was conducted due to national recommendations for restrictions regarding the COVID-19 pandemic (e.g. social distancing).  This format is felt to be most appropriate for this patient at this time.  All issues noted in this document were discussed and addressed.  No physical exam was performed (except for noted visual exam findings with Video Visits). Virtual Visit via Video Note  I connected with@  on 06/14/18 at  1:15 PM EDT by a video enabled telemedicine application and verified that I am speaking with the correct person using two identifiers.  Location patient: home Location provider:work  Persons participating in the virtual visit: patient, provider  I discussed the limitations of evaluation and management by telemedicine and the availability of in person appointments. The patient expressed understanding and agreed to proceed.   HPI:  Left leg pain x 2 days, worsening.   Describes left top of foot and outer ankle. Swelling from top of foot up to knee. Swelling has worsened. Feels redness and heat from left foot.   Fell 2 days ago when taking trash out, slipped on wet floor of garage. 'Slid down.' No head injury, and no LOC. Heard a pop in left leg.   Had temp of 101F 2 days at urgent care. "doesn't feel sick' .   No fever, sob, chills, cough.  Pain not well controlled, not able to sleep.   [facility]12[facility]18 - closed fracture of distal end of left fibula.  Given [employer]. Non weight bearing.  Pending  Podiatry appointment, unsure in regards appointment.   Given 15 tabs of norco q8 which filled was 2 days ago. Has doubled the medication with no relief of pain.   Had followed with [facility]. Dr Holland Falling, in [place] in the past whom managed her pain in the past.    H/o muscular dystrophy, osteomyelitis ( left in 2011and right great toe amputation)  Widow since 14 months ago. Had nervous breakdown. Now following with Dr Cheryln Manly and feels  stable on current regimen. NO hi/si.   ROS: See pertinent positives and negatives per HPI.  Past Medical History:  Diagnosis Date  . Allergic rhinitis   . Anxiety   . Asthma   . Charcot-Marie-Tooth disease   . Chickenpox   . Depression   . Lisfranc dislocation   . Muscular dystrophy ([place])   . Osteomyelitis ([employer]) 07/16/2013  . Osteomyelitis of foot ([place])   . Stress incontinence     Past Surgical History:  Procedure Laterality Date  . ECTOPIC PREGNANCY SURGERY  1995  . Great toe amputation Left April 2011  . Great toe amputation Right October 2015  . TUBAL LIGATION  2005    Family History  Problem Relation Age of Onset  . Hyperlipidemia Father   . Hypertension Father   . Sudden death Sister        Pneumonia  . Diabetes Paternal Grandmother   . Muscular dystrophy Other        Mother, grandparents, other relatives on maternal side    SOCIAL HX: smoker   Current Outpatient Medications:  .  albuterol (PROVENTIL HFA;VENTOLIN HFA) 108 (90 Base) MCG/ACT inhaler, Inhale 2 puffs into the lungs every 6 (six) hours as needed for wheezing or shortness of breath., Disp: 1 Inhaler, Rfl: 0 .  fluticasone (FLONASE) 50 MCG/ACT nasal spray, Place 2 sprays into both nostrils daily., Disp: 16 g, Rfl: 6 .  HYDROcodone-acetaminophen (NORCO/VICODIN) 5-325 MG tablet, 1 TAP Q 8 HR PRN PAIN,  Disp: , Rfl:  .  loratadine (CLARITIN) 10 MG tablet, Take 1 tablet (10 mg total) by mouth daily., Disp: 30 tablet, Rfl: 5 .  mirtazapine (REMERON) 15 MG tablet, TAKE 1 TABLET BY MOUTH EVERYDAY AT BEDTIME, Disp: , Rfl:  .  sertraline (ZOLOFT) 50 MG tablet, TAKE 1 TABLET BY MOUTH EVERY DAY THEN INCREASE TO TAKE 2 TABLETS BY MOUTH EVERY DAY, Disp: 60 tablet, Rfl: 3 .  clonazePAM (KLONOPIN) 0.5 MG tablet, Take 0.5 tablets (0.25 mg total) by mouth 2 (two) times daily as needed for anxiety. (Patient not taking: Reported on 06/14/2018), Disp: 10 tablet, Rfl: 0 .  hydrOXYzine (ATARAX/VISTARIL) 25 MG tablet, TAKE 1  TABLET (25 MG TOTAL) BY MOUTH 3 (THREE) TIMES DAILY AS NEEDED FOR ANXIETY. (Patient not taking: Reported on 05/30/2018), Disp: 30 tablet, Rfl: 0  EXAM:  VITALS per patient if applicable:  GENERAL: alert, oriented, appears well and in no acute distress  HEENT: atraumatic, conjunttiva clear, no obvious abnormalities on inspection of external nose and ears  NECK: normal movements of the head and neck  LUNGS: on inspection no signs of respiratory distress, breathing rate appears normal, no obvious gross SOB, gasping or wheezing  CV: no obvious cyanosis  MS:  Left lower extremity: Obvious swelling, discoloration appreciated over video from dorsal aspect left foot to proximal to left knee. Scars seen as well. Bilateral great toes are amputated  PSYCH/NEURO: pleasant and cooperative, no obvious depression or anxiety, speech and thought processing grossly intact  ASSESSMENT AND PLAN:  Discussed the following assessment and plan:  Left foot pain  Problem List Items Addressed This Visit      Other   Left foot pain - Primary    Acute. Worsening over 2 days. Discussed limitations of virtual medicine and my concern for DVT as swelling extends up the leg. I also expressed concern for compartment syndrome and infection. I advised she needed to be seen today and have given patient information for walk in clinic [facility]. She stated she would go once she spoke with her daughter in regards to a ride. For pain, I advised ibuprofen, ice, elevation. We will follow.            I discussed the assessment and treatment plan with the patient. The patient was provided an opportunity to ask questions and all were answered. The patient agreed with the plan and demonstrated an understanding of the instructions.   The patient was advised to call back or seek an in-person evaluation if the symptoms worsen or if the condition fails to improve as anticipated.   Mable Paris, FNP

## 2018-06-14 NOTE — Telephone Encounter (Signed)
FYI   Copied from [place] [id number]. Topic: Quick Communication - See Telephone Encounter >> Jun 14, 2018  1:58 PM Nils Flack wrote: CRM for notification. See Telephone encounter for: 06/14/18 pt has appt tomorrow at 1045 Dr Cleda Mccreedy. She wanted to let you know.   She only wants to see Dr Caryl Bis, she does not want to see Joycelyn Schmid again.  Cb is [phone]

## 2018-06-14 NOTE — Telephone Encounter (Signed)
Noted. Discussed visit with Joycelyn Schmid previously.

## 2018-06-14 NOTE — Telephone Encounter (Signed)
Copied from [place] [id number]. Topic: Appointment Scheduling - Scheduling Inquiry for Clinic >> Jun 14, 2018  8:24 AM Virl Axe D wrote: Reason for CRM: Pt would like to cancel her lab appt for tomorrow 06/15/18. Please advise.

## 2018-06-14 NOTE — Assessment & Plan Note (Signed)
Acute. Worsening over 2 days. Discussed limitations of virtual medicine and my concern for DVT as swelling extends up the leg. I also expressed concern for compartment syndrome and infection. I advised she needed to be seen today and have given patient information for walk in clinic [facility]. She stated she would go once she spoke with her daughter in regards to a ride. For pain, I advised ibuprofen, ice, elevation. We will follow.

## 2018-06-14 NOTE — Progress Notes (Signed)
I called and spoke with patient. She still declined [facility] & stated that she has an appointment with Dr. Jens Som tomorrow morning at 10:45am. I advised the importance of keeping this appointment that you & Dr. Caryl Bis were concerned of DVT & compartment syndrome. I also advised ice, elevation & ibuprofen.

## 2018-06-15 ENCOUNTER — Other Ambulatory Visit: Payer: Medicare Other

## 2018-06-15 DIAGNOSIS — S82832A Other fracture of upper and lower end of left fibula, initial encounter for closed fracture: Secondary | ICD-10-CM | POA: Diagnosis not present

## 2018-06-15 DIAGNOSIS — M25572 Pain in left ankle and joints of left foot: Secondary | ICD-10-CM | POA: Diagnosis not present

## 2018-06-16 NOTE — Telephone Encounter (Signed)
Seen by podiatry 06/15/18

## 2018-06-22 ENCOUNTER — Other Ambulatory Visit: Admission: RE | Admit: 2018-06-22 | Source: Ambulatory Visit

## 2018-06-22 ENCOUNTER — Ambulatory Visit: Payer: Self-pay | Admitting: Psychology

## 2018-06-23 ENCOUNTER — Other Ambulatory Visit
Admission: RE | Admit: 2018-06-23 | Discharge: 2018-06-23 | Disposition: A | Discharge status: Home / Self Care | Payer: Medicare Other | Source: Ambulatory Visit | Attending: Gastroenterology | Admitting: Gastroenterology

## 2018-06-23 ENCOUNTER — Other Ambulatory Visit: Payer: Self-pay

## 2018-06-23 DIAGNOSIS — Z01812 Encounter for preprocedural laboratory examination: Secondary | ICD-10-CM | POA: Diagnosis not present

## 2018-06-23 DIAGNOSIS — Z1159 Encounter for screening for other viral diseases: Secondary | ICD-10-CM | POA: Insufficient documentation

## 2018-06-24 LAB — NOVEL CORONAVIRUS, NAA (HOSP ORDER, SEND-OUT TO REF LAB; TAT 18-24 HRS): SARS-CoV-2, NAA: NOT DETECTED

## 2018-06-27 ENCOUNTER — Encounter
Admission: RE | Disposition: A | Discharge status: Home / Self Care | Payer: Self-pay | Source: Home / Self Care | Attending: Gastroenterology

## 2018-06-27 ENCOUNTER — Encounter: Payer: Self-pay | Admitting: *Deleted

## 2018-06-27 ENCOUNTER — Encounter: Payer: Self-pay | Admitting: Anesthesiology

## 2018-06-27 ENCOUNTER — Ambulatory Visit
Admission: RE | Admit: 2018-06-27 | Discharge: 2018-06-27 | Disposition: A | Discharge status: Home / Self Care | Payer: Medicare Other | Attending: Gastroenterology | Admitting: Gastroenterology

## 2018-06-27 ENCOUNTER — Other Ambulatory Visit: Payer: Self-pay

## 2018-06-27 DIAGNOSIS — Z5309 Procedure and treatment not carried out because of other contraindication: Secondary | ICD-10-CM | POA: Insufficient documentation

## 2018-06-27 DIAGNOSIS — Z800 Family history of malignant neoplasm of digestive organs: Secondary | ICD-10-CM

## 2018-06-27 LAB — URINE DRUG SCREEN, QUALITATIVE (ARMC ONLY)
Amphetamines, Ur Screen: POSITIVE — AB
Barbiturates, Ur Screen: NOT DETECTED
Benzodiazepine, Ur Scrn: NOT DETECTED
Cannabinoid 50 Ng, Ur ~~LOC~~: NOT DETECTED
Cocaine Metabolite,Ur ~~LOC~~: POSITIVE — AB
MDMA (Ecstasy)Ur Screen: NOT DETECTED
Methadone Scn, Ur: NOT DETECTED
Opiate, Ur Screen: NOT DETECTED
Phencyclidine (PCP) Ur S: NOT DETECTED
Tricyclic, Ur Screen: NOT DETECTED

## 2018-06-27 LAB — POCT PREGNANCY, URINE: Preg Test, Ur: NEGATIVE

## 2018-06-27 SURGERY — COLONOSCOPY WITH PROPOFOL
Anesthesia: General

## 2018-06-27 MED ORDER — SODIUM CHLORIDE 0.9 % IV SOLN: INTRAVENOUS | Status: DC

## 2018-06-27 MED ORDER — PROPOFOL 500 MG/50ML IV EMUL
INTRAVENOUS | Status: AC
  Filled 2018-06-27: qty 50

## 2018-06-27 NOTE — Progress Notes (Signed)
Urine positive for cocaine. Procedure cancelled per anesthesia. Dr. Allen Norris spoke with patient and patient aware of test results and discharged to home

## 2018-06-28 ENCOUNTER — Ambulatory Visit (INDEPENDENT_AMBULATORY_CARE_PROVIDER_SITE_OTHER): Payer: Medicare Other | Admitting: Psychology

## 2018-06-28 DIAGNOSIS — F4323 Adjustment disorder with mixed anxiety and depressed mood: Secondary | ICD-10-CM | POA: Diagnosis not present

## 2018-07-03 ENCOUNTER — Other Ambulatory Visit: Payer: Self-pay | Admitting: Family Medicine

## 2018-07-05 ENCOUNTER — Ambulatory Visit (INDEPENDENT_AMBULATORY_CARE_PROVIDER_SITE_OTHER): Payer: Medicare Other | Admitting: Psychology

## 2018-07-05 DIAGNOSIS — F4320 Adjustment disorder, unspecified: Secondary | ICD-10-CM

## 2018-07-07 NOTE — Telephone Encounter (Signed)
Last fill 05/30/18 last OV 5/20

## 2018-07-12 ENCOUNTER — Ambulatory Visit: Payer: Medicare Other | Admitting: Psychology

## 2018-07-14 ENCOUNTER — Other Ambulatory Visit: Payer: Self-pay

## 2018-07-14 ENCOUNTER — Ambulatory Visit (INDEPENDENT_AMBULATORY_CARE_PROVIDER_SITE_OTHER): Payer: Medicare Other | Admitting: Family Medicine

## 2018-07-14 DIAGNOSIS — S82892D Other fracture of left lower leg, subsequent encounter for closed fracture with routine healing: Secondary | ICD-10-CM | POA: Diagnosis not present

## 2018-07-14 DIAGNOSIS — F419 Anxiety disorder, unspecified: Secondary | ICD-10-CM

## 2018-07-14 DIAGNOSIS — N393 Stress incontinence (female) (male): Secondary | ICD-10-CM | POA: Diagnosis not present

## 2018-07-14 DIAGNOSIS — R825 Elevated urine levels of drugs, medicaments and biological substances: Secondary | ICD-10-CM

## 2018-07-14 DIAGNOSIS — F329 Major depressive disorder, single episode, unspecified: Secondary | ICD-10-CM

## 2018-07-14 DIAGNOSIS — F32A Depression, unspecified: Secondary | ICD-10-CM

## 2018-07-14 NOTE — Progress Notes (Signed)
Virtual Visit via video Note  This visit type was conducted due to national recommendations for restrictions regarding the COVID-19 pandemic (e.g. social distancing).  This format is felt to be most appropriate for this patient at this time.  All issues noted in this document were discussed and addressed.  No physical exam was performed (except for noted visual exam findings with Video Visits).   I connected with [name] today at 11:30 AM EDT by a video enabled telemedicine application and verified that I am speaking with the correct person using two identifiers. Location patient: home Location provider: work  Persons participating in the virtual visit: patient, provider  I discussed the limitations, risks, security and privacy concerns of performing an evaluation and management service by telephone and the availability of in person appointments. I also discussed with the patient that there may be a patient responsible charge related to this service. The patient expressed understanding and agreed to proceed.  Reason for visit: Follow-up.  HPI: Depression/anxiety: She notes this continues to be an issue.  She does not feel that the Zoloft has been helpful and she has tapered down to 50 mg daily.  She notes a lack of energy.  She sleeps a lot.  She denies SI.  She has been seeing Dr. Cheryln Manly for therapy and that has been helpful.  She only takes the clonazepam if needed and does not take it every day.  She does note she has been taking the Remeron several nights a week though it makes her quite drowsy.  Positive urine drug screen: This was noted when done prior to her colonoscopy which had to be rescheduled and she has not rescheduled that yet.  She was positive for cocaine and amphetamines.  She notes this was a social use and that she does not use illicit substances typically.  Left ankle fracture: She continues to follow with podiatry.  She needs to reschedule her appointment with  them.  She is still wearing a boot and notes her pain is improving significantly.  She does take Aleve in the morning.  Urine incontinence: This has been going on for a long time.  She notes some dampness when she sleeping at times.  She also has stress incontinence with laughing and walking too fast.  She notes no stool incontinence.  No constipation.    ROS: See pertinent positives and negatives per HPI.  Past Medical History:  Diagnosis Date  . Allergic rhinitis   . Anxiety   . Asthma   . Charcot-Marie-Tooth disease   . Chickenpox   . Depression   . Lisfranc dislocation   . Muscular dystrophy ([place])   . Osteomyelitis ([place]) 07/16/2013  . Osteomyelitis of foot ([place])   . Stress incontinence     Past Surgical History:  Procedure Laterality Date  . ECTOPIC PREGNANCY SURGERY  1995  . Great toe amputation Left April 2011  . Great toe amputation Right October 2015  . TUBAL LIGATION  2005    Family History  Problem Relation Age of Onset  . Hyperlipidemia Father   . Hypertension Father   . Sudden death Sister        Pneumonia  . Diabetes Paternal Grandmother   . Muscular dystrophy Other        Mother, grandparents, other relatives on maternal side    SOCIAL HX: Smoker.   Current Outpatient Medications:  .  albuterol (PROVENTIL HFA;VENTOLIN HFA) 108 (90 Base) MCG/ACT inhaler, Inhale 2 puffs into the lungs  every 6 (six) hours as needed for wheezing or shortness of breath., Disp: 1 Inhaler, Rfl: 0 .  fluticasone (FLONASE) 50 MCG/ACT nasal spray, Place 2 sprays into both nostrils daily., Disp: 16 g, Rfl: 6 .  hydrOXYzine (ATARAX/VISTARIL) 25 MG tablet, TAKE 1 TABLET (25 MG TOTAL) BY MOUTH 3 (THREE) TIMES DAILY AS NEEDED FOR ANXIETY., Disp: 30 tablet, Rfl: 0 .  loratadine (CLARITIN) 10 MG tablet, Take 1 tablet (10 mg total) by mouth daily., Disp: 30 tablet, Rfl: 5 .  mirtazapine (REMERON) 15 MG tablet, TAKE 1 TABLET BY MOUTH EVERYDAY AT BEDTIME, Disp: , Rfl:  .  sertraline  (ZOLOFT) 50 MG tablet, TAKE 1 TABLET BY MOUTH EVERY DAY THEN INCREASE TO TAKE 2 TABLETS BY MOUTH EVERY DAY, Disp: 60 tablet, Rfl: 3  EXAM:  VITALS per patient if applicable: None.  GENERAL: alert, oriented, appears well and in no acute distress  HEENT: atraumatic, conjunttiva clear, no obvious abnormalities on inspection of external nose and ears  NECK: normal movements of the head and neck  LUNGS: on inspection no signs of respiratory distress, breathing rate appears normal, no obvious gross SOB, gasping or wheezing  CV: no obvious cyanosis  MS: moves all visible extremities without noticeable abnormality  PSYCH/NEURO: pleasant and cooperative, speech and thought processing grossly intact  ASSESSMENT AND PLAN:  Discussed the following assessment and plan:  Positive urine drug screen I advised the patient to not use illicit substances.  I discussed that given this positive drug screen I would no longer be able to prescribe clonazepam for her.  She verbalized understanding and noted that that was understandable.  Stress incontinence Refer to urology.  Anxiety and depression This continues to be an issue.  We will transition her to Lexapro.  She will discontinue the Zoloft and then the next day start on Lexapro.  She will continue to see her therapist.  The CMA contacted the patient's pharmacy to cancel the most recent clonazepam prescription.  She will discontinue use of Remeron.  Closed left ankle fracture Improving.  She will continue to follow with podiatry.  She will contact them to schedule follow-up.    I discussed the assessment and treatment plan with the patient. The patient was provided an opportunity to ask questions and all were answered. The patient agreed with the plan and demonstrated an understanding of the instructions.   The patient was advised to call back or seek an in-person evaluation if the symptoms worsen or if the condition fails to improve as  anticipated.    [name], MD

## 2018-07-15 ENCOUNTER — Encounter: Payer: Self-pay | Admitting: Family Medicine

## 2018-07-15 DIAGNOSIS — R825 Elevated urine levels of drugs, medicaments and biological substances: Secondary | ICD-10-CM | POA: Insufficient documentation

## 2018-07-15 MED ORDER — HYDROXYZINE HCL 25 MG PO TABS: 25.0000 mg | ORAL_TABLET | Freq: Three times a day (TID) | ORAL | 0 refills | Status: DC | PRN

## 2018-07-15 MED ORDER — ESCITALOPRAM OXALATE 10 MG PO TABS: 10.0000 mg | ORAL_TABLET | Freq: Every day | ORAL | 1 refills | Status: DC

## 2018-07-15 NOTE — Assessment & Plan Note (Signed)
I advised the patient to not use illicit substances.  I discussed that given this positive drug screen I would no longer be able to prescribe clonazepam for her.  She verbalized understanding and noted that that was understandable.

## 2018-07-15 NOTE — Assessment & Plan Note (Signed)
Refer to urology.  ?

## 2018-07-15 NOTE — Assessment & Plan Note (Signed)
Improving.  She will continue to follow with podiatry.  She will contact them to schedule follow-up.

## 2018-07-15 NOTE — Assessment & Plan Note (Addendum)
This continues to be an issue.  We will transition her to Lexapro.  She will discontinue the Zoloft and then the next day start on Lexapro.  She will continue to see her therapist.  The CMA contacted the patient's pharmacy to cancel the most recent clonazepam prescription.  She will discontinue use of Remeron.

## 2018-08-02 ENCOUNTER — Encounter: Payer: Self-pay | Admitting: Family

## 2018-08-02 ENCOUNTER — Ambulatory Visit (INDEPENDENT_AMBULATORY_CARE_PROVIDER_SITE_OTHER): Payer: Medicare Other | Admitting: Family

## 2018-08-02 ENCOUNTER — Other Ambulatory Visit: Payer: Self-pay

## 2018-08-02 DIAGNOSIS — Z20828 Contact with and (suspected) exposure to other viral communicable diseases: Secondary | ICD-10-CM | POA: Diagnosis not present

## 2018-08-02 DIAGNOSIS — F17210 Nicotine dependence, cigarettes, uncomplicated: Secondary | ICD-10-CM

## 2018-08-02 DIAGNOSIS — J329 Chronic sinusitis, unspecified: Secondary | ICD-10-CM | POA: Insufficient documentation

## 2018-08-02 DIAGNOSIS — J0100 Acute maxillary sinusitis, unspecified: Secondary | ICD-10-CM | POA: Diagnosis not present

## 2018-08-02 DIAGNOSIS — R059 Cough, unspecified: Secondary | ICD-10-CM

## 2018-08-02 DIAGNOSIS — R05 Cough: Secondary | ICD-10-CM

## 2018-08-02 MED ORDER — DOXYCYCLINE HYCLATE 100 MG PO TABS: 100.0000 mg | ORAL_TABLET | Freq: Two times a day (BID) | ORAL | 0 refills | Status: DC

## 2018-08-02 MED ORDER — FLUCONAZOLE 150 MG PO TABS: 150.0000 mg | ORAL_TABLET | Freq: Once | ORAL | 1 refills | Status: AC

## 2018-08-02 MED ORDER — LORATADINE 10 MG PO TABS: 10.0000 mg | ORAL_TABLET | Freq: Every day | ORAL | 5 refills | Status: DC

## 2018-08-02 NOTE — Patient Instructions (Addendum)
If congestion is thick., start mucinex with plenty of water. May start claritin when symptoms resolve.   Start doxycycline; avoid the sun as we discussed.   Ensure to take probiotics while on antibiotics and also for 2 weeks after completion. It is important to re-colonize the gut with good bacteria and also to prevent any diarrheal infections associated with antibiotic use.   Stay home as we cannot be sure this is not covid. Stay quarantined until complete resolutions of symptoms for 3 days and at least 7 days since symptom onset.   Let me know if you are not better   Sinusitis, Adult Sinusitis is soreness and swelling (inflammation) of your sinuses. Sinuses are hollow spaces in the bones around your face. They are located:  Around your eyes.  In the middle of your forehead.  Behind your nose.  In your cheekbones. Your sinuses and nasal passages are lined with a fluid called mucus. Mucus drains out of your sinuses. Swelling can trap mucus in your sinuses. This lets germs (bacteria, virus, or fungus) grow, which leads to infection. Most of the time, this condition is caused by a virus. What are the causes? This condition is caused by:  Allergies.  Asthma.  Germs.  Things that block your nose or sinuses.  Growths in the nose (nasal polyps).  Chemicals or irritants in the air.  Fungus (rare). What increases the risk? You are more likely to develop this condition if:  You have a weak body defense system (immune system).  You do a lot of swimming or diving.  You use nasal sprays too much.  You smoke. What are the signs or symptoms? The main symptoms of this condition are pain and a feeling of pressure around the sinuses. Other symptoms include:  Stuffy nose (congestion).  Runny nose (drainage).  Swelling and warmth in the sinuses.  Headache.  Toothache.  A cough that may get worse at night.  Mucus that collects in the throat or the back of the nose  (postnasal drip).  Being unable to smell and taste.  Being very tired (fatigue).  A fever.  Sore throat.  Bad breath. How is this diagnosed? This condition is diagnosed based on:  Your symptoms.  Your medical history.  A physical exam.  Tests to find out if your condition is short-term (acute) or long-term (chronic). Your doctor may: ? Check your nose for growths (polyps). ? Check your sinuses using a tool that has a light (endoscope). ? Check for allergies or germs. ? Do imaging tests, such as an MRI or CT scan. How is this treated? Treatment for this condition depends on the cause and whether it is short-term or long-term.  If caused by a virus, your symptoms should go away on their own within 10 days. You may be given medicines to relieve symptoms. They include: ? Medicines that shrink swollen tissue in the nose. ? Medicines that treat allergies (antihistamines). ? A spray that treats swelling of the nostrils. ? Rinses that help get rid of thick mucus in your nose (nasal saline washes).  If caused by bacteria, your doctor may wait to see if you will get better without treatment. You may be given antibiotic medicine if you have: ? A very bad infection. ? A weak body defense system.  If caused by growths in the nose, you may need to have surgery. Follow these instructions at home: Medicines  Take, use, or apply over-the-counter and prescription medicines only as told by your  doctor. These may include nasal sprays.  If you were prescribed an antibiotic medicine, take it as told by your doctor. Do not stop taking the antibiotic even if you start to feel better. Hydrate and humidify   Drink enough water to keep your pee (urine) pale yellow.  Use a cool mist humidifier to keep the humidity level in your home above 50%.  Breathe in steam for 10-15 minutes, 3-4 times a day, or as told by your doctor. You can do this in the bathroom while a hot shower is running.  Try  not to spend time in cool or dry air. Rest  Rest as much as you can.  Sleep with your head raised (elevated).  Make sure you get enough sleep each night. General instructions   Put a warm, moist washcloth on your face 3-4 times a day, or as often as told by your doctor. This will help with discomfort.  Wash your hands often with soap and water. If there is no soap and water, use hand sanitizer.  Do not smoke. Avoid being around people who are smoking (secondhand smoke).  Keep all follow-up visits as told by your doctor. This is important. Contact a doctor if:  You have a fever.  Your symptoms get worse.  Your symptoms do not get better within 10 days. Get help right away if:  You have a very bad headache.  You cannot stop throwing up (vomiting).  You have very bad pain or swelling around your face or eyes.  You have trouble seeing.  You feel confused.  Your neck is stiff.  You have trouble breathing. Summary  Sinusitis is swelling of your sinuses. Sinuses are hollow spaces in the bones around your face.  This condition is caused by tissues in your nose that become inflamed or swollen. This traps germs. These can lead to infection.  If you were prescribed an antibiotic medicine, take it as told by your doctor. Do not stop taking it even if you start to feel better.  Keep all follow-up visits as told by your doctor. This is important. This information is not intended to replace advice given to you by your health care provider. Make sure you discuss any questions you have with your health care provider. Document Released: 06/23/2007 Document Revised: 06/06/2017 Document Reviewed: 06/06/2017 Elsevier Patient Education  2020 [employer].

## 2018-08-02 NOTE — Progress Notes (Signed)
This visit type was conducted due to national recommendations for restrictions regarding the COVID-19 pandemic (e.g. social distancing).  This format is felt to be most appropriate for this patient at this time.  All issues noted in this document were discussed and addressed.  No physical exam was performed (except for noted visual exam findings with Video Visits). Virtual Visit via Video Note  I connected with@  on 08/02/18 at 11:00 AM EDT by a video enabled telemedicine application and verified that I am speaking with the correct person using two identifiers.  Location patient: home Location provider:work Persons participating in the virtual visit: patient, provider  I discussed the limitations of evaluation and management by telemedicine and the availability of in person appointments. The patient expressed understanding and agreed to proceed.   HPI:  CC: right sided sinus pressure 4 days, worsening.  Endorses nasal congestion, fatigue. No fever, chills, V, abdominal pain, dysuria, ear pain, sore throat. Teeth are not sore, no pain with eating.  Thick-Green congestion and 'some nausea' from constant drainage.  Using flonase and [employer]. Had been on claritin; needs refill.  No known covid contacts. Doesn't work. Stays home.  No recent antibiotics.   Gets yeast infection with antibiotic.   ROS: See pertinent positives and negatives per HPI.  Past Medical History:  Diagnosis Date  . Allergic rhinitis   . Anxiety   . Asthma   . Charcot-Marie-Tooth disease   . Chickenpox   . Depression   . Lisfranc dislocation   . Muscular dystrophy ([place])   . Osteomyelitis ([place]) 07/16/2013  . Osteomyelitis of foot ([place])   . Stress incontinence     Past Surgical History:  Procedure Laterality Date  . ECTOPIC PREGNANCY SURGERY  1995  . Great toe amputation Left April 2011  . Great toe amputation Right October 2015  . TUBAL LIGATION  2005    Family History  Problem Relation Age of  Onset  . Hyperlipidemia Father   . Hypertension Father   . Sudden death Sister        Pneumonia  . Diabetes Paternal Grandmother   . Muscular dystrophy Other        Mother, grandparents, other relatives on maternal side    SOCIAL HX: smoker   Current Outpatient Medications:  .  albuterol (PROVENTIL HFA;VENTOLIN HFA) 108 (90 Base) MCG/ACT inhaler, Inhale 2 puffs into the lungs every 6 (six) hours as needed for wheezing or shortness of breath., Disp: 1 Inhaler, Rfl: 0 .  doxycycline (VIBRA-TABS) 100 MG tablet, Take 1 tablet (100 mg total) by mouth 2 (two) times daily., Disp: 10 tablet, Rfl: 0 .  escitalopram (LEXAPRO) 10 MG tablet, Take 1 tablet (10 mg total) by mouth daily., Disp: 90 tablet, Rfl: 1 .  fluconazole (DIFLUCAN) 150 MG tablet, Take 1 tablet (150 mg total) by mouth once for 1 dose. Take one tablet PO once. If sxs persist, may take one tablet PO 3 days later., Disp: 2 tablet, Rfl: 1 .  fluticasone (FLONASE) 50 MCG/ACT nasal spray, Place 2 sprays into both nostrils daily., Disp: 16 g, Rfl: 6 .  hydrOXYzine (ATARAX/VISTARIL) 25 MG tablet, Take 1 tablet (25 mg total) by mouth 3 (three) times daily as needed for anxiety., Disp: 30 tablet, Rfl: 0 .  loratadine (CLARITIN) 10 MG tablet, Take 1 tablet (10 mg total) by mouth daily., Disp: 30 tablet, Rfl: 5 .  sertraline (ZOLOFT) 50 MG tablet, TAKE 1 TABLET BY MOUTH EVERY DAY THEN INCREASE TO TAKE 2  TABLETS BY MOUTH EVERY DAY, Disp: 60 tablet, Rfl: 3  EXAM:  VITALS per patient if applicable:  GENERAL: alert, oriented, appears well and in no acute distress  HEENT: atraumatic, conjunttiva clear, no obvious abnormalities on inspection of external nose and ears. When patient presses on right side of face, endorses right sided sinus tenderness. No facial swelling.   NECK: normal movements of the head and neck  LUNGS: on inspection no signs of respiratory distress, breathing rate appears normal, no obvious gross SOB, gasping or  wheezing  CV: no obvious cyanosis  MS: moves all visible extremities without noticeable abnormality  PSYCH/NEURO: pleasant and cooperative, no obvious depression or anxiety, speech and thought processing grossly intact  ASSESSMENT AND PLAN:  Discussed the following assessment and plan:  Problem List Items Addressed This Visit      Respiratory   Sinusitis - Primary    Well appearing. She does not look toxic in appearance. Based on unilateral appearance, suspect bacterial etiology. Start doxycycline. Advised to avoid the sun. She will will self quarantine as we cannot be sure not COVID. She declines COVID testing at this time. She will let me know how she is doing.      Relevant Medications   loratadine (CLARITIN) 10 MG tablet   doxycycline (VIBRA-TABS) 100 MG tablet   fluconazole (DIFLUCAN) 150 MG tablet    Other Visit Diagnoses    Cough in adult       Cigarette smoker             I discussed the assessment and treatment plan with the patient. The patient was provided an opportunity to ask questions and all were answered. The patient agreed with the plan and demonstrated an understanding of the instructions.   The patient was advised to call back or seek an in-person evaluation if the symptoms worsen or if the condition fails to improve as anticipated.   Mable Paris, FNP

## 2018-08-02 NOTE — Assessment & Plan Note (Signed)
Well appearing. She does not look toxic in appearance. Based on unilateral appearance, suspect bacterial etiology. Start doxycycline. Advised to avoid the sun. She will will self quarantine as we cannot be sure not COVID. She declines COVID testing at this time. She will let me know how she is doing.

## 2018-08-22 ENCOUNTER — Telehealth: Payer: Self-pay | Admitting: Family Medicine

## 2018-08-22 NOTE — Telephone Encounter (Signed)
Pt called and states that she must have left her medication escitalopram [place] the beach. Pt states that she tried to get medication refilled but it cost $900. Pt would like to know if anything can be done to help.  Pt states that she is experiencing light headedness. Please advise

## 2018-08-23 NOTE — Telephone Encounter (Signed)
Can you call her pharmacy and confirm what it would cost for her to get it refilled early?  Thanks.

## 2018-08-23 NOTE — Telephone Encounter (Signed)
Pt called and states that she must have left her medication escitalopram [place] the beach. Pt states that she tried to get medication refilled but it cost $900. Pt would like to know if anything can be done to help.  Pt states that she is experiencing light headedness. Please advise

## 2018-08-24 NOTE — Telephone Encounter (Signed)
I called the pharmacy and the pharmacist states this medication is $207 for a 90 day supply without insurance.  Breslin Burklow,cma

## 2018-08-24 NOTE — Telephone Encounter (Signed)
Patient  Called back to say that she would like some help from Dr Caryl Bis with getting her medication the escitalopram (LEXAPRO) 10 MG tablet she states that she may have [place] the bottle at the [place] while [place] was [place]. She states that it is very expensive to get a refill at this point. Please call patient at Ph# [phone]

## 2018-08-25 MED ORDER — CITALOPRAM HYDROBROMIDE 20 MG PO TABS: 20.0000 mg | ORAL_TABLET | Freq: Every day | ORAL | 3 refills | Status: DC

## 2018-08-25 NOTE — Telephone Encounter (Signed)
Patient checking on the status of message, patient concern going into the weekend without medication, informed patient PCP is clinic and received message.

## 2018-08-25 NOTE — Telephone Encounter (Signed)
Celexa sent to pharmacy.

## 2018-08-25 NOTE — Telephone Encounter (Signed)
We could change her to a different SSRI and see if they would cover that. Please see if she is ok making a change.

## 2018-08-25 NOTE — Telephone Encounter (Signed)
I called and spoke with the patient and she is ok with you changing the medication, she has been out all week. She wants it sent to [facility] on [address]  [name]

## 2018-08-30 ENCOUNTER — Ambulatory Visit (INDEPENDENT_AMBULATORY_CARE_PROVIDER_SITE_OTHER): Payer: Medicare Other | Admitting: Psychology

## 2018-08-30 DIAGNOSIS — F4323 Adjustment disorder with mixed anxiety and depressed mood: Secondary | ICD-10-CM

## 2018-09-11 ENCOUNTER — Encounter: Payer: Self-pay | Admitting: Urology

## 2018-09-11 ENCOUNTER — Other Ambulatory Visit: Payer: Self-pay

## 2018-09-11 ENCOUNTER — Ambulatory Visit (INDEPENDENT_AMBULATORY_CARE_PROVIDER_SITE_OTHER): Payer: Medicare Other | Admitting: Urology

## 2018-09-11 VITALS — BP 115/75 | HR 86 | Ht 67.0 in | Wt 164.0 lb

## 2018-09-11 DIAGNOSIS — N393 Stress incontinence (female) (male): Secondary | ICD-10-CM | POA: Diagnosis not present

## 2018-09-11 DIAGNOSIS — N3946 Mixed incontinence: Secondary | ICD-10-CM

## 2018-09-11 LAB — BLADDER SCAN AMB NON-IMAGING

## 2018-09-11 NOTE — Progress Notes (Signed)
09/11/2018 9:10 AM   [name] [birth year] [id number]  Referring provider: Leone Haven, MD [address] [place],  [place] [address]  No chief complaint on file.   HPI: I was consulted to assist the patient is urinary incontinence worsening over number years.  She leaks with coughing sneezing and carrying objects.  She does not necessarily leak with bending lifting.  Sometimes she might have urge incontinence.  She does have bedwetting moderate severity.  She wears 4 pads a day moderately wet  She gets up at least 3 times a night.  She is hourly frequency and cannot hold urination for 2 hours.  She denies a history of kidney stones bladder infections and previous GU surgery.  No neurologic issues.  No hysterectomy.  Regular bowel movements.  No medical treatment  Modifying factors: There are no other modifying factors  Associated signs and symptoms: There are no other associated signs and symptoms Aggravating and relieving factors: There are no other aggravating or relieving factors Severity: Moderate Duration: Persistent   PMH: Past Medical History:  Diagnosis Date  . Allergic rhinitis   . Anxiety   . Asthma   . Charcot-Marie-Tooth disease   . Chickenpox   . Depression   . Lisfranc dislocation   . Muscular dystrophy ([place])   . Osteomyelitis ([place]) 07/16/2013  . Osteomyelitis of foot ([place])   . Stress incontinence     Surgical History: Past Surgical History:  Procedure Laterality Date  . ECTOPIC PREGNANCY SURGERY  1995  . Great toe amputation Left April 2011  . Great toe amputation Right October 2015  . TUBAL LIGATION  2005    Home Medications:  Allergies as of 09/11/2018      Reactions   Bee Venom Swelling   Vancomycin Shortness Of Breath   Penicillins    Other reaction(s): RASH   Sulfamethoxazole-trimethoprim Itching   Tramadol Nausea Only   Other reaction(s): NAUSEA   Clindamycin Nausea Only   And heartburn      Medication  List       Accurate as of September 11, 2018  9:10 AM. If you have any questions, ask your nurse or doctor.        albuterol 108 (90 Base) MCG/ACT inhaler Commonly known as: VENTOLIN HFA Inhale 2 puffs into the lungs every 6 (six) hours as needed for wheezing or shortness of breath.   citalopram 20 MG tablet Commonly known as: CELEXA Take 1 tablet (20 mg total) by mouth daily.   doxycycline 100 MG tablet Commonly known as: VIBRA-TABS Take 1 tablet (100 mg total) by mouth 2 (two) times daily.   fluticasone 50 MCG/ACT nasal spray Commonly known as: FLONASE Place 2 sprays into both nostrils daily.   hydrOXYzine 25 MG tablet Commonly known as: ATARAX/VISTARIL Take 1 tablet (25 mg total) by mouth 3 (three) times daily as needed for anxiety.   loratadine 10 MG tablet Commonly known as: CLARITIN Take 1 tablet (10 mg total) by mouth daily.   sertraline 50 MG tablet Commonly known as: ZOLOFT TAKE 1 TABLET BY MOUTH EVERY DAY THEN INCREASE TO TAKE 2 TABLETS BY MOUTH EVERY DAY       Allergies:  Allergies  Allergen Reactions  . Bee Venom Swelling  . Vancomycin Shortness Of Breath  . Penicillins     Other reaction(s): RASH  . Sulfamethoxazole-Trimethoprim Itching  . Tramadol Nausea Only    Other reaction(s): NAUSEA  . Clindamycin Nausea Only    And  heartburn    Family History: Family History  Problem Relation Age of Onset  . Hyperlipidemia Father   . Hypertension Father   . Sudden death Sister        Pneumonia  . Diabetes Paternal Grandmother   . Muscular dystrophy Other        Mother, grandparents, other relatives on maternal side    Social History:  reports that she has been smoking. She has never used smokeless tobacco. She reports current alcohol use. She reports previous drug use.  ROS:                                        Physical Exam: There were no vitals taken for this visit.  Constitutional:  Alert and oriented, No acute  distress. HEENT: [place] AT, moist mucus membranes.  Trachea midline, no masses. Cardiovascular: No clubbing, cyanosis, or edema. Respiratory: Normal respiratory effort, no increased work of breathing. GI: Abdomen is soft, nontender, nondistended, no abdominal masses GU: Grade 2 hypermobility the bladder neck and a mild positive cough test.  Grade 1 cystocele or smaller Skin: No rashes, bruises or suspicious lesions. Lymph: No cervical or inguinal adenopathy. Neurologic: Grossly intact, no focal deficits, moving all 4 extremities. Psychiatric: Normal mood and affect.  Laboratory Data: Lab Results  Component Value Date   WBC 6.4 02/15/2018   HGB 14.2 02/15/2018   HCT 42.6 02/15/2018   MCV 88.3 02/15/2018   PLT 321.0 02/15/2018    Lab Results  Component Value Date   CREATININE 0.48 02/15/2018    No results found for: PSA  No results found for: TESTOSTERONE  No results found for: HGBA1C  Urinalysis No results found for: COLORURINE, APPEARANCEUR, LABSPEC, [place], GLUCOSEU, HGBUR, BILIRUBINUR, [place], PROTEINUR, UROBILINOGEN, NITRITE, LEUKOCYTESUR  Pertinent Imaging:   Assessment & Plan: Patient has mixed incontinence.  She has hourly frequency and bedwetting.  The role of urodynamics discussed.  It appears that she has had some issues with chronic pain and medications.  She has had some anxiety issues.  Urodynamics ordered and patient will return for follow-up  There are no diagnoses linked to this encounter.  No follow-ups on file.  Reece Packer, MD  [facility] [address] [place], [place] [address] [phone]

## 2018-09-12 LAB — URINALYSIS, COMPLETE
Bilirubin, UA: NEGATIVE
Glucose, UA: NEGATIVE
Ketones, UA: NEGATIVE
Leukocytes,UA: NEGATIVE
Nitrite, UA: NEGATIVE
Protein,UA: NEGATIVE
Specific Gravity, UA: <1.005 — ABNORMAL LOW (ref 1.005–1.030)
Urobilinogen, Ur: 0.2 mg/dL (ref 0.2–1.0)
pH, UA: 5.5 (ref 5.0–7.5)

## 2018-09-12 LAB — MICROSCOPIC EXAMINATION
Bacteria, UA: NONE SEEN
RBC: NONE SEEN /hpf (ref 0–2)

## 2018-09-17 ENCOUNTER — Other Ambulatory Visit: Payer: Self-pay | Admitting: Family Medicine

## 2018-09-18 ENCOUNTER — Ambulatory Visit (INDEPENDENT_AMBULATORY_CARE_PROVIDER_SITE_OTHER): Payer: Medicare Other | Admitting: Psychology

## 2018-09-18 DIAGNOSIS — F4323 Adjustment disorder with mixed anxiety and depressed mood: Secondary | ICD-10-CM

## 2018-09-24 ENCOUNTER — Other Ambulatory Visit: Payer: Self-pay | Admitting: Family Medicine

## 2018-09-24 DIAGNOSIS — F419 Anxiety disorder, unspecified: Secondary | ICD-10-CM

## 2018-09-24 DIAGNOSIS — F32A Depression, unspecified: Secondary | ICD-10-CM

## 2018-09-24 DIAGNOSIS — F329 Major depressive disorder, single episode, unspecified: Secondary | ICD-10-CM

## 2018-10-02 ENCOUNTER — Ambulatory Visit (INDEPENDENT_AMBULATORY_CARE_PROVIDER_SITE_OTHER): Payer: Medicare Other | Admitting: Psychology

## 2018-10-02 DIAGNOSIS — F4323 Adjustment disorder with mixed anxiety and depressed mood: Secondary | ICD-10-CM

## 2018-10-09 ENCOUNTER — Ambulatory Visit: Payer: Medicare Other | Admitting: Urology

## 2018-10-16 DIAGNOSIS — R350 Frequency of micturition: Secondary | ICD-10-CM | POA: Diagnosis not present

## 2018-10-16 DIAGNOSIS — N3944 Nocturnal enuresis: Secondary | ICD-10-CM | POA: Diagnosis not present

## 2018-10-16 DIAGNOSIS — N3946 Mixed incontinence: Secondary | ICD-10-CM | POA: Diagnosis not present

## 2018-10-18 ENCOUNTER — Other Ambulatory Visit: Payer: Self-pay | Admitting: Urology

## 2018-11-06 ENCOUNTER — Encounter: Payer: Self-pay | Admitting: Urology

## 2018-11-06 ENCOUNTER — Ambulatory Visit (INDEPENDENT_AMBULATORY_CARE_PROVIDER_SITE_OTHER): Payer: Medicare Other | Admitting: Urology

## 2018-11-06 ENCOUNTER — Other Ambulatory Visit: Payer: Self-pay

## 2018-11-06 VITALS — BP 99/66 | HR 101 | Ht 67.0 in | Wt 154.0 lb

## 2018-11-06 DIAGNOSIS — N3946 Mixed incontinence: Secondary | ICD-10-CM | POA: Diagnosis not present

## 2018-11-06 MED ORDER — MIRABEGRON ER 50 MG PO TB24: 50.0000 mg | ORAL_TABLET | Freq: Every day | ORAL | 3 refills | Status: DC

## 2018-11-06 NOTE — Progress Notes (Signed)
11/06/2018 10:03 AM   [name] 1975-08-17 [id number]  Referring provider: Leone Haven, MD [address] [place],   [address]  Chief Complaint  Patient presents with  . Results    UDS    HPI: I was consulted to assist the patient is urinary incontinence worsening over number years.  She leaks with coughing sneezing and carrying objects.  She does not necessarily leak with bending lifting.  Sometimes she might have urge incontinence.  She does have bedwetting moderate severity.  She wears 4 pads a day moderately wet  She gets up at least 3 times a night.  She is hourly frequency and cannot hold urination for 2 hours.  Grade 2 hypermobility the bladder neck and a mild positive cough test.  Grade 1 cystocele or smaller  Patient has mixed incontinence.  She has hourly frequency and bedwetting.  The role of urodynamics discussed.  It appears that she has had some issues with chronic pain and medications.  She has had some anxiety issues.  Urodynamics ordered and patient will return for follow-up  Today He comes in stable.  Incontinence stable. On urodynamics the patient did not void was catheterized for 275 mL.  Maximum bladder capacity was 500 mL.  Bladder was stable.  Her cough leak point pressure 100 mL was 90 cmH2O with moderate leakage.  At 300 mL her cough leak point pressure was 85 cmH2O with moderate severity.  During voluntary voiding she voided 500 mL with a maximum flow 14 mils per second.  She had to use abdominal straining to assist voiding.  The contraction was not well sustained.  Maximum voiding pressure is 8 cm of water.  EMG activity was within normal limits  Bladder neck descent at 1 or 2 cm.  The details of the urodynamics are signed and dictated  Patient did say that she has no pelvic pain but has a lot of leg pain and has had some nondiabetic I believe toe amputations because of a type of muscular dystrophy she reports    PMH:  Past Medical History:  Diagnosis Date  . Allergic rhinitis   . Anxiety   . Asthma   . Charcot-Marie-Tooth disease   . Chickenpox   . Depression   . Lisfranc dislocation   . Muscular dystrophy ([place])   . Osteomyelitis ([place]) 07/16/2013  . Osteomyelitis of foot ([place])   . Stress incontinence     Surgical History: Past Surgical History:  Procedure Laterality Date  . ECTOPIC PREGNANCY SURGERY  1995  . Great toe amputation Left April 2011  . Great toe amputation Right October 2015  . TUBAL LIGATION  2005    Home Medications:  Allergies as of 11/06/2018      Reactions   Bee Venom Swelling   Vancomycin Shortness Of Breath   Penicillins    Other reaction(s): RASH   Sulfamethoxazole-trimethoprim Itching   Tramadol Nausea Only   Other reaction(s): NAUSEA   Clindamycin Nausea Only   And heartburn      Medication List       Accurate as of November 06, 2018 10:03 AM. If you have any questions, ask your nurse or doctor.        STOP taking these medications   citalopram 20 MG tablet Commonly known as: CELEXA Stopped by: Reece Packer, MD   doxycycline 100 MG tablet Commonly known as: VIBRA-TABS Stopped by: Reece Packer, MD   sertraline 50 MG tablet Commonly known  as: ZOLOFT Stopped by: Reece Packer, MD     TAKE these medications   albuterol 108 (90 Base) MCG/ACT inhaler Commonly known as: VENTOLIN HFA Inhale 2 puffs into the lungs every 6 (six) hours as needed for wheezing or shortness of breath.   escitalopram 10 MG tablet Commonly known as: LEXAPRO   fluticasone 50 MCG/ACT nasal spray Commonly known as: FLONASE Place 2 sprays into both nostrils daily.   hydrOXYzine 25 MG tablet Commonly known as: ATARAX/VISTARIL TAKE 1 TABLET (25 MG TOTAL) BY MOUTH 3 (THREE) TIMES DAILY AS NEEDED FOR ANXIETY.   loratadine 10 MG tablet Commonly known as: CLARITIN Take 1 tablet (10 mg total) by mouth daily.   mirtazapine 15 MG tablet Commonly known as:  REMERON TAKE 1 TABLET BY MOUTH EVERYDAY AT BEDTIME       Allergies:  Allergies  Allergen Reactions  . Bee Venom Swelling  . Vancomycin Shortness Of Breath  . Penicillins     Other reaction(s): RASH  . Sulfamethoxazole-Trimethoprim Itching  . Tramadol Nausea Only    Other reaction(s): NAUSEA  . Clindamycin Nausea Only    And heartburn    Family History: Family History  Problem Relation Age of Onset  . Hyperlipidemia Father   . Hypertension Father   . Sudden death Sister        Pneumonia  . Diabetes Paternal Grandmother   . Muscular dystrophy Other        Mother, grandparents, other relatives on maternal side    Social History:  reports that she has been smoking. She has never used smokeless tobacco. She reports current alcohol use. She reports previous drug use.  ROS:                                        Physical Exam: BP 99/66   Pulse (!) 101   Ht 5\' 7"  (1.702 m)   Wt 154 lb (69.9 kg)   BMI 24.12 kg/m   Constitutional:  Alert and oriented, No acute distress.   Laboratory Data: Lab Results  Component Value Date   WBC 6.4 02/15/2018   HGB 14.2 02/15/2018   HCT 42.6 02/15/2018   MCV 88.3 02/15/2018   PLT 321.0 02/15/2018    Lab Results  Component Value Date   CREATININE 0.48 02/15/2018    No results found for: PSA  No results found for: TESTOSTERONE  No results found for: HGBA1C  Urinalysis    Component Value Date/Time   APPEARANCEUR Clear 09/11/2018 0921   GLUCOSEU Negative 09/11/2018 0921   BILIRUBINUR Negative 09/11/2018 0921   PROTEINUR Negative 09/11/2018 0921   NITRITE Negative 09/11/2018 0921   LEUKOCYTESUR Negative 09/11/2018 [id number]    Pertinent Imaging:   Assessment & Plan: Patient primarily has stress incontinence during the day with mild urge incontinence.  She does have bedwetting.  Importantly she also has hourly frequency.  She has a history of chronic neuropathic pain.  There was no evidence of  painful bladder during urodynamics.  Reassess in 5 to 6 weeks on Myrbetriq samples and prescription.  Eventually if we do not reach her treatment goal I will offer her surgery though an injectable might be something she would consider  There are no diagnoses linked to this encounter.  No follow-ups on file.  Reece Packer, MD  [facility] [address], [place], [place] [address] [phone]

## 2018-11-08 ENCOUNTER — Other Ambulatory Visit: Payer: Self-pay | Admitting: Family

## 2018-11-08 DIAGNOSIS — R058 Other specified cough: Secondary | ICD-10-CM

## 2018-11-08 DIAGNOSIS — R05 Cough: Secondary | ICD-10-CM

## 2018-11-22 ENCOUNTER — Ambulatory Visit (INDEPENDENT_AMBULATORY_CARE_PROVIDER_SITE_OTHER): Payer: Medicare Other | Admitting: Psychology

## 2018-11-22 DIAGNOSIS — F4323 Adjustment disorder with mixed anxiety and depressed mood: Secondary | ICD-10-CM

## 2018-11-24 ENCOUNTER — Other Ambulatory Visit: Payer: Self-pay

## 2018-11-27 ENCOUNTER — Encounter: Payer: Self-pay | Admitting: Family Medicine

## 2018-11-27 ENCOUNTER — Other Ambulatory Visit: Payer: Self-pay

## 2018-11-27 ENCOUNTER — Ambulatory Visit (INDEPENDENT_AMBULATORY_CARE_PROVIDER_SITE_OTHER): Payer: Medicare Other | Admitting: Family Medicine

## 2018-11-27 DIAGNOSIS — Z23 Encounter for immunization: Secondary | ICD-10-CM

## 2018-11-27 DIAGNOSIS — M7710 Lateral epicondylitis, unspecified elbow: Secondary | ICD-10-CM | POA: Insufficient documentation

## 2018-11-27 DIAGNOSIS — M7711 Lateral epicondylitis, right elbow: Secondary | ICD-10-CM

## 2018-11-27 MED ORDER — MELOXICAM 15 MG PO TABS: 15.0000 mg | ORAL_TABLET | Freq: Every day | ORAL | 0 refills | Status: DC | PRN

## 2018-11-27 NOTE — Patient Instructions (Signed)
Nice to see you. I believe you have tennis elbow and that is likely contributing to your discomfort.  You could also have strained muscle in your shoulder.  Please try the meloxicam.  Please take this with food.  If it irritates your stomach at all please discontinue it and let us know.  We could then try a topical medication. Please ice your elbow 2-3 times daily for 10 minutes at a time.

## 2018-11-27 NOTE — Assessment & Plan Note (Signed)
Patient is tender at her right elbow lateral epicondyle consistent with likely tennis elbow.  She may have also overused or strained muscles in her shoulder on the right as well.  Discussed trial of meloxicam.  Advised to take this with food.  She did note some mild stomach upset with the Aleve and I advised if she had any stomach irritation or discomfort with the meloxicam she needs to let us know and we can switch to a topical medication.  Discussed icing of her elbow 2-3 times daily for 10 minutes at a time.  Discussed risk of nerve damage with prolonged icing.  If not improving she will let us know.

## 2018-11-27 NOTE — Progress Notes (Signed)
  [name], MD Phone: [phone]  [name] is a 43 y.o. female who presents today for same-day visit.  Right shoulder, elbow, and wrist pain: Patient notes this started about 2 and half weeks ago.  She started crafting and woodworking around that time.  Notes the elbow pain is in the lateral epicondyle area.  She notes right shoulder discomfort as well.  She notes extending her elbow bothers her and range of motion at her right shoulder.  She notes no injury.  No numbness.  No tingling.  No weakness.  Aleve has been minimally beneficial.  She has been using Aspercreme with lidocaine which has been somewhat beneficial.  Social History   Tobacco Use  Smoking Status Current Every Day Smoker  Smokeless Tobacco Never Used     ROS see history of present illness  Objective  Physical Exam Vitals:   11/27/18 0929  BP: 130/80  Pulse: 93  Temp: (!) 96.9 F (36.1 C)  SpO2: 97%    BP Readings from Last 3 Encounters:  11/27/18 130/80  11/06/18 99/66  09/11/18 115/75   Wt Readings from Last 3 Encounters:  11/27/18 159 lb 1.9 oz (72.2 kg)  11/06/18 154 lb (69.9 kg)  09/11/18 164 lb (74.4 kg)    Physical Exam Constitutional:      General: She is not in acute distress. Pulmonary:     Effort: Pulmonary effort is normal.  Musculoskeletal:     Comments: Bilateral shoulders are symmetric, no tenderness bilateral shoulders, no tenderness of her bilateral wrist, there is tenderness at the right elbow lateral epicondyle area, no other elbow tenderness bilaterally, full range of motion bilateral shoulders and elbows with discomfort on active range of motion of her right shoulder, no discomfort of passive range of motion in either shoulder, pain on empty can testing at her elbow though no pain at her shoulder on empty can testing  Neurological:     Mental Status: She is alert.     Comments: 5/5 strength in bilateral biceps, triceps, grip, quads, hamstrings, plantar and  dorsiflexion, sensation to light touch intact in bilateral UE and LE      Assessment/Plan: Please see individual problem list.  Tennis elbow Patient is tender at her right elbow lateral epicondyle consistent with likely tennis elbow.  She may have also overused or strained muscles in her shoulder on the right as well.  Discussed trial of meloxicam.  Advised to take this with food.  She did note some mild stomach upset with the Aleve and I advised if she had any stomach irritation or discomfort with the meloxicam she needs to let us know and we can switch to a topical medication.  Discussed icing of her elbow 2-3 times daily for 10 minutes at a time.  Discussed risk of nerve damage with prolonged icing.  If not improving she will let us know.  No orders of the defined types were placed in this encounter.   Meds ordered this encounter  Medications  . meloxicam (MOBIC) 15 MG tablet    Sig: Take 1 tablet (15 mg total) by mouth daily as needed for pain.    Dispense:  10 tablet    Refill:  0     [name], MD [place]

## 2018-12-04 ENCOUNTER — Telehealth: Payer: Self-pay | Admitting: Family Medicine

## 2018-12-04 DIAGNOSIS — M1460 Charcot's joint, unspecified site: Secondary | ICD-10-CM

## 2018-12-04 NOTE — Telephone Encounter (Signed)
Patient calling and states that she was advised to call if the meloxicam (MOBIC) 15 MG tablet  Gives her any issues. States that she is getting stomach irritation. States that she was told that a topical could be sent to the pharmacy if this happened. Mentions that she has used lidocaine patches before and that has helped a lot with the pain in her right shoulder, elbow, and wrist. Please advise. [id number] Lorina Rabon, [address]  Rye Dorado,cma

## 2018-12-04 NOTE — Telephone Encounter (Signed)
Patient calling and states that she was advised to call if the meloxicam (MOBIC) 15 MG tablet  Gives her any issues. States that she is getting stomach irritation. States that she was told that a topical could be sent to the pharmacy if this happened. Mentions that she has used lidocaine patches before and that has helped a lot with the pain in her right shoulder, elbow, and wrist. Please advise. [facility] #[id number] Lorina Rabon, [place]

## 2018-12-05 MED ORDER — LIDOCAINE 5 % EX PTCH: 1.0000 | MEDICATED_PATCH | CUTANEOUS | 0 refills | Status: DC

## 2018-12-05 NOTE — Telephone Encounter (Signed)
Lidocaine patches sent in   Other OTC options that are good are: bengay, biofreeze, voltaren gel  LG

## 2018-12-05 NOTE — Telephone Encounter (Signed)
I called the patient and gave her the OVC medications she could try and she stated she has and that she has lidocaine that she is waiting for a approval from the insurance on in the meantime.  Nina,cma

## 2018-12-06 NOTE — Telephone Encounter (Signed)
Pt stated a prior authorization is needed for the Lidocaine. Pt requests call back

## 2018-12-07 NOTE — Telephone Encounter (Signed)
Patient requesting callback with  status update on prior authorization for lidocaine today

## 2018-12-07 NOTE — Telephone Encounter (Signed)
Pt returned call to advise that the Lidocaine patches are for her shoulder, elbow, and wrist.

## 2018-12-08 NOTE — Telephone Encounter (Signed)
Patient called back to check on prior authorization status for lidocaine patches.  Informed pt that prior authorization is still pending after speaking with Gae Bon.  Informed pt that she will receive a call once an approval or denial is received.

## 2018-12-11 ENCOUNTER — Telehealth: Payer: Self-pay

## 2018-12-11 NOTE — Telephone Encounter (Signed)
I called [employer] and started a prior authorization for Lidocaine 5% patch for this patient, Awaiting a response within 72 hours. Reference # [id number].  Shondell Fabel,cma

## 2018-12-13 ENCOUNTER — Telehealth: Payer: Self-pay

## 2018-12-13 NOTE — Telephone Encounter (Signed)
I called and left a message for the patient to return a call to me, The request for Lidocaine patches was denied and the patient could get OVC lidocaine patches 4%.  I asked the patient to return a call back to me.  Earsie Humm,cma

## 2018-12-18 ENCOUNTER — Ambulatory Visit: Payer: Medicare Other | Admitting: Urology

## 2019-01-02 ENCOUNTER — Other Ambulatory Visit: Payer: Self-pay | Admitting: Family Medicine

## 2019-01-02 DIAGNOSIS — F419 Anxiety disorder, unspecified: Secondary | ICD-10-CM

## 2019-01-02 DIAGNOSIS — F329 Major depressive disorder, single episode, unspecified: Secondary | ICD-10-CM

## 2019-01-02 DIAGNOSIS — F32A Depression, unspecified: Secondary | ICD-10-CM

## 2019-01-02 MED ORDER — MIRABEGRON ER 50 MG PO TB24: 50.0000 mg | ORAL_TABLET | Freq: Every day | ORAL | 3 refills | Status: DC

## 2019-01-02 NOTE — Telephone Encounter (Signed)
Please call the patient and see if she has continued to take this medication.  We previously switched her from Lexapro to Celexa.  It also appears that she may be taking Remeron currently.  Please see if she is taking that medication.  Thanks.

## 2019-01-24 ENCOUNTER — Ambulatory Visit (INDEPENDENT_AMBULATORY_CARE_PROVIDER_SITE_OTHER): Payer: Medicare Other | Admitting: Psychology

## 2019-01-24 DIAGNOSIS — F4323 Adjustment disorder with mixed anxiety and depressed mood: Secondary | ICD-10-CM | POA: Diagnosis not present

## 2019-01-25 ENCOUNTER — Ambulatory Visit: Payer: Medicare Other | Attending: Internal Medicine

## 2019-01-25 DIAGNOSIS — Z20822 Contact with and (suspected) exposure to covid-19: Secondary | ICD-10-CM

## 2019-01-27 LAB — NOVEL CORONAVIRUS, NAA: SARS-CoV-2, NAA: NOT DETECTED

## 2019-01-29 ENCOUNTER — Ambulatory Visit: Payer: Medicare Other | Admitting: Urology

## 2019-02-02 ENCOUNTER — Other Ambulatory Visit: Payer: Self-pay | Admitting: Family

## 2019-02-02 DIAGNOSIS — R058 Other specified cough: Secondary | ICD-10-CM

## 2019-02-02 DIAGNOSIS — R05 Cough: Secondary | ICD-10-CM

## 2019-02-06 ENCOUNTER — Ambulatory Visit (INDEPENDENT_AMBULATORY_CARE_PROVIDER_SITE_OTHER): Payer: Medicare Other | Admitting: Psychology

## 2019-02-06 DIAGNOSIS — F4323 Adjustment disorder with mixed anxiety and depressed mood: Secondary | ICD-10-CM

## 2019-02-12 ENCOUNTER — Ambulatory Visit (INDEPENDENT_AMBULATORY_CARE_PROVIDER_SITE_OTHER): Payer: Medicare Other | Admitting: Urology

## 2019-02-12 ENCOUNTER — Encounter: Payer: Self-pay | Admitting: Urology

## 2019-02-12 ENCOUNTER — Other Ambulatory Visit: Payer: Self-pay

## 2019-02-12 VITALS — BP 134/95 | HR 88 | Ht 67.0 in | Wt 159.0 lb

## 2019-02-12 DIAGNOSIS — N3946 Mixed incontinence: Secondary | ICD-10-CM | POA: Diagnosis not present

## 2019-02-12 NOTE — Progress Notes (Signed)
02/12/2019 9:59 AM   [name] 10/24/1975 [id number]  Referring provider: Leone Haven, MD [address] [place],   [address]  Chief Complaint  Patient presents with  . Urinary Incontinence    HPI: I was consulted to assist the patient is urinary incontinence worsening over number years. She leaks with coughing sneezing and carrying objects. She does not necessarily leak with bending lifting. Sometimes she might have urge incontinence. She does have bedwetting moderate severity. She wears 4 pads a day moderately wet  She gets up at least 3 times a night. She is hourly frequency and cannot hold urination for 2 hours.  Grade 2 hypermobility the bladder neck and a mild positive cough test. Grade 1 cystocele or smaller  Patient has mixed incontinence. She has hourly frequency and bedwetting.  It appears that she has had some issues with chronic pain and medications. She has had some anxiety issues.   On urodynamics the patient did not void was catheterized for 275 mL.  Maximum bladder capacity was 500 mL.  Bladder was stable.  Her cough leak point pressure 100 mL was 90 cmH2O with moderate leakage.  At 300 mL her cough leak point pressure was 85 cmH2O with moderate severity.  During voluntary voiding she voided 500 mL with a maximum flow 14 mils per second.  She had to use abdominal straining to assist voiding.  The contraction was not well sustained.  Maximum voiding pressure is 8 cm of water.   Bladder neck descent at 1 or 2 cm.  The details of the urodynamics are signed and dictated  Patient did say that she has no pelvic pain but has a lot of leg pain and has had some nondiabetic I believe toe amputations because of a type of muscular dystrophy she reports    Patient primarily has stress incontinence during the day with mild urge incontinence.  She does have bedwetting.  Importantly she also has hourly frequency.  She has a history of  chronic neuropathic pain.  There was no evidence of painful bladder during urodynamics.  Reassess in 5 to 6 weeks on Myrbetriq samples and prescription.  Eventually if we do not reach her treatment goal I will offer her surgery though an injectable might be something she would consider  Today Incontinence dramatically better.  Frequency persisting but improved.  Clinically not infected   PMH: Past Medical History:  Diagnosis Date  . Allergic rhinitis   . Anxiety   . Asthma   . Charcot-Marie-Tooth disease   . Chickenpox   . Depression   . Lisfranc dislocation   . Muscular dystrophy ([place])   . Osteomyelitis ([place]) 07/16/2013  . Osteomyelitis of foot ([place])   . Stress incontinence     Surgical History: Past Surgical History:  Procedure Laterality Date  . ECTOPIC PREGNANCY SURGERY  1995  . Great toe amputation Left April 2011  . Great toe amputation Right October 2015  . TUBAL LIGATION  2005    Home Medications:  Allergies as of 02/12/2019      Reactions   Bee Venom Swelling   Vancomycin Shortness Of Breath   Penicillins    Other reaction(s): RASH   Sulfamethoxazole-trimethoprim Itching   Tramadol Nausea Only   Other reaction(s): NAUSEA   Clindamycin Nausea Only   And heartburn      Medication List       Accurate as of February 12, 2019  9:59 AM. If you have any questions,  ask your nurse or doctor.        STOP taking these medications   lidocaine 5 % Commonly known as: Lidoderm Stopped by: Reece Packer, MD   meloxicam 15 MG tablet Commonly known as: MOBIC Stopped by: Reece Packer, MD     TAKE these medications   albuterol 108 (90 Base) MCG/ACT inhaler Commonly known as: VENTOLIN HFA INHALE 2 PUFFS EVERY 6 HOURS AS NEEDED FOR WHEEZE OR SHORTNESS OF BREATH   escitalopram 10 MG tablet Commonly known as: LEXAPRO TAKE 1 TABLET BY MOUTH EVERY DAY   fluticasone 50 MCG/ACT nasal spray Commonly known as: FLONASE Place 2 sprays into both nostrils  daily.   hydrOXYzine 25 MG tablet Commonly known as: ATARAX/VISTARIL TAKE 1 TABLET (25 MG TOTAL) BY MOUTH 3 (THREE) TIMES DAILY AS NEEDED FOR ANXIETY.   loratadine 10 MG tablet Commonly known as: CLARITIN Take 1 tablet (10 mg total) by mouth daily.   mirabegron ER 50 MG Tb24 tablet Commonly known as: MYRBETRIQ Take 1 tablet (50 mg total) by mouth daily.   mirtazapine 15 MG tablet Commonly known as: REMERON TAKE 1 TABLET BY MOUTH EVERYDAY AT BEDTIME       Allergies:  Allergies  Allergen Reactions  . Bee Venom Swelling  . Vancomycin Shortness Of Breath  . Penicillins     Other reaction(s): RASH  . Sulfamethoxazole-Trimethoprim Itching  . Tramadol Nausea Only    Other reaction(s): NAUSEA  . Clindamycin Nausea Only    And heartburn    Family History: Family History  Problem Relation Age of Onset  . Hyperlipidemia Father   . Hypertension Father   . Sudden death Sister        Pneumonia  . Diabetes Paternal Grandmother   . Muscular dystrophy Other        Mother, grandparents, other relatives on maternal side    Social History:  reports that she has been smoking. She has never used smokeless tobacco. She reports current alcohol use. She reports previous drug use.  ROS: UROLOGY Frequent Urination?: Yes Hard to postpone urination?: Yes Burning/pain with urination?: No Get up at night to urinate?: Yes Leakage of urine?: Yes Urine stream starts and stops?: No Trouble starting stream?: No Do you have to strain to urinate?: No Blood in urine?: No Urinary tract infection?: No Sexually transmitted disease?: No Injury to kidneys or bladder?: No Painful intercourse?: No Weak stream?: No Currently pregnant?: No Vaginal bleeding?: No Last menstrual period?: n  Gastrointestinal Nausea?: No Vomiting?: No Indigestion/heartburn?: No Diarrhea?: No Constipation?: No  Constitutional Fever: No Night sweats?: No Weight loss?: No Fatigue?: No  Skin Skin  rash/lesions?: No Itching?: No  Eyes Blurred vision?: No Double vision?: No  Ears/Nose/Throat Sore throat?: No Sinus problems?: No  Hematologic/Lymphatic Swollen glands?: No Easy bruising?: No  Cardiovascular Leg swelling?: No Chest pain?: No  Respiratory Cough?: No Shortness of breath?: No  Endocrine Excessive thirst?: No  Musculoskeletal Back pain?: No Joint pain?: No  Neurological Headaches?: No Dizziness?: No  Psychologic Depression?: Yes Anxiety?: Yes  Physical Exam: BP (!) 134/95   Pulse 88   Ht 5\' 7"  (1.702 m)   Wt 72.1 kg   BMI 24.90 kg/m   Constitutional:  Alert and oriented, No acute distress.  Laboratory Data: Lab Results  Component Value Date   WBC 6.4 02/15/2018   HGB 14.2 02/15/2018   HCT 42.6 02/15/2018   MCV 88.3 02/15/2018   PLT 321.0 02/15/2018    Lab Results  Component Value Date   CREATININE 0.48 02/15/2018    No results found for: PSA  No results found for: TESTOSTERONE  No results found for: HGBA1C  Urinalysis    Component Value Date/Time   APPEARANCEUR Clear 09/11/2018 0921   GLUCOSEU Negative 09/11/2018 0921   BILIRUBINUR Negative 09/11/2018 0921   PROTEINUR Negative 09/11/2018 0921   NITRITE Negative 09/11/2018 0921   LEUKOCYTESUR Negative 09/11/2018 [id number]    Pertinent Imaging:    Assessment & Plan: Reassess 4 months for durability  There are no diagnoses linked to this encounter.  No follow-ups on file.  Reece Packer, MD  [facility] [address] [place], [place] [address] [phone]

## 2019-02-19 ENCOUNTER — Ambulatory Visit (INDEPENDENT_AMBULATORY_CARE_PROVIDER_SITE_OTHER): Payer: Medicare Other | Admitting: Family Medicine

## 2019-02-19 ENCOUNTER — Encounter: Payer: Self-pay | Admitting: Family Medicine

## 2019-02-19 ENCOUNTER — Ambulatory Visit (INDEPENDENT_AMBULATORY_CARE_PROVIDER_SITE_OTHER): Payer: Medicare Other

## 2019-02-19 ENCOUNTER — Other Ambulatory Visit: Payer: Self-pay

## 2019-02-19 VITALS — Ht 67.0 in | Wt 159.0 lb

## 2019-02-19 DIAGNOSIS — R41840 Attention and concentration deficit: Secondary | ICD-10-CM

## 2019-02-19 DIAGNOSIS — F329 Major depressive disorder, single episode, unspecified: Secondary | ICD-10-CM

## 2019-02-19 DIAGNOSIS — F419 Anxiety disorder, unspecified: Secondary | ICD-10-CM | POA: Diagnosis not present

## 2019-02-19 DIAGNOSIS — Z0000 Encounter for general adult medical examination without abnormal findings: Secondary | ICD-10-CM | POA: Diagnosis not present

## 2019-02-19 DIAGNOSIS — F32A Depression, unspecified: Secondary | ICD-10-CM

## 2019-02-19 DIAGNOSIS — J989 Respiratory disorder, unspecified: Secondary | ICD-10-CM | POA: Diagnosis not present

## 2019-02-19 MED ORDER — HYDROXYZINE HCL 25 MG PO TABS: 25.0000 mg | ORAL_TABLET | Freq: Three times a day (TID) | ORAL | 0 refills | Status: DC | PRN

## 2019-02-19 NOTE — Patient Instructions (Addendum)
  [name] , Thank you for taking time to come for your Medicare Wellness Visit. I appreciate your ongoing commitment to your health goals. Please review the following plan we discussed and let me know if I can assist you in the future.   These are the goals we discussed: Goals      Patient Stated   . DIET - INCREASE WATER INTAKE (pt-stated)    . Maintain Healthy Lifestyle (pt-stated)     Stay active         This is a list of the screening recommended for you and due dates:  Health Maintenance  Topic Date Due  . HIV Screening  03/15/1988  . Tetanus Vaccine  03/15/1992  . Pap Smear  03/15/1994  . Flu Shot  Completed

## 2019-02-19 NOTE — Assessment & Plan Note (Signed)
We are going to refer her to psychology for ADHD testing.

## 2019-02-19 NOTE — Progress Notes (Signed)
Virtual Visit via video Note  This visit type was conducted due to national recommendations for restrictions regarding the COVID-19 pandemic (e.g. social distancing).  This format is felt to be most appropriate for this patient at this time.  All issues noted in this document were discussed and addressed.  No physical exam was performed (except for noted visual exam findings with Video Visits).   I connected with Frances Nickels today at 11:00 AM EST by a video enabled telemedicine application and verified that I am speaking with the correct person using two identifiers. Location patient: home Location provider: work  Persons participating in the virtual visit: patient, provider  I discussed the limitations, risks, security and privacy concerns of performing an evaluation and management service by telephone and the availability of in person appointments. I also discussed with the patient that there may be a patient responsible charge related to this service. The patient expressed understanding and agreed to proceed.  Reason for visit: follow-up  HPI: Anxiety/depression/history of ADHD: Patient notes this is overall better.  She has some days that are not as good as others.  She started to have more memories of when her husband was dying.  She notes she feels like she had blocked a lot of it out at the time.  She is taking Lexapro and Remeron.  She takes the Remeron as needed at night.  The Remeron does help her sleep.  She is taking the hydroxyzine less than once a day.  No SI or HI.  She is seeing Dr. Cheryln Manly for therapy and notes that has been very beneficial.  She notes her mind does wander and she has trouble focusing on things.  She notes Dr. Cheryln Manly has noted that as well.  She will frequently start multiple projects at one time and not finish them.  She does have a history of ADHD.  Respiratory illness: Patient noted she was not feeling well in early January.  She ended up getting  tested for COVID-19 and that was negative.  She notes no symptoms currently.  She does note a friend that she was around yesterday was exposed to somebody in their household who tested positive for COVID-19.  The patient's friend has no Covid symptoms and has not tested positive.  The patient found out about that today.   ROS: See pertinent positives and negatives per HPI.  Past Medical History:  Diagnosis Date  . Allergic rhinitis   . Anxiety   . Asthma   . Charcot-Marie-Tooth disease   . Chickenpox   . Depression   . Lisfranc dislocation   . Muscular dystrophy ([place])   . Osteomyelitis ([place]) 07/16/2013  . Osteomyelitis of foot ([place])   . Stress incontinence     Past Surgical History:  Procedure Laterality Date  . ECTOPIC PREGNANCY SURGERY  1995  . Great toe amputation Left April 2011  . Great toe amputation Right October 2015  . TUBAL LIGATION  2005    Family History  Problem Relation Age of Onset  . Hyperlipidemia Father   . Hypertension Father   . Sudden death Sister        Pneumonia  . Diabetes Paternal Grandmother   . Muscular dystrophy Other        Mother, grandparents, other relatives on maternal side    SOCIAL HX: Current smoker   Current Outpatient Medications:  .  albuterol (VENTOLIN HFA) 108 (90 Base) MCG/ACT inhaler, INHALE 2 PUFFS EVERY 6 HOURS AS NEEDED FOR WHEEZE  OR SHORTNESS OF BREATH, Disp: 18 g, Rfl: 1 .  escitalopram (LEXAPRO) 10 MG tablet, TAKE 1 TABLET BY MOUTH EVERY DAY, Disp: 90 tablet, Rfl: 1 .  fluticasone (FLONASE) 50 MCG/ACT nasal spray, Place 2 sprays into both nostrils daily., Disp: 16 g, Rfl: 6 .  hydrOXYzine (ATARAX/VISTARIL) 25 MG tablet, Take 1 tablet (25 mg total) by mouth 3 (three) times daily as needed for anxiety., Disp: 30 tablet, Rfl: 0 .  loratadine (CLARITIN) 10 MG tablet, Take 1 tablet (10 mg total) by mouth daily., Disp: 30 tablet, Rfl: 5 .  mirabegron ER (MYRBETRIQ) 50 MG TB24 tablet, Take 1 tablet (50 mg total) by mouth daily.,  Disp: 90 tablet, Rfl: 3 .  mirtazapine (REMERON) 15 MG tablet, TAKE 1 TABLET BY MOUTH EVERYDAY AT BEDTIME, Disp: , Rfl:   EXAM:  VITALS per patient if applicable:  GENERAL: alert, oriented, appears well and in no acute distress  HEENT: atraumatic, conjunttiva clear, no obvious abnormalities on inspection of external nose and ears  NECK: normal movements of the head and neck  LUNGS: on inspection no signs of respiratory distress, breathing rate appears normal, no obvious gross SOB, gasping or wheezing  CV: no obvious cyanosis  MS: moves all visible extremities without noticeable abnormality  PSYCH/NEURO: pleasant and cooperative, no obvious depression or anxiety, speech and thought processing grossly intact  ASSESSMENT AND PLAN:  Discussed the following assessment and plan:  Attention deficit We are going to refer her to psychology for ADHD testing.  Anxiety and depression Improved.  She will continue her current regimen.  She will continue to see her therapist.  We will request records from them.  Respiratory illness Symptoms have resolved.  She will monitor for any further symptoms.  She did have a negative COVID-19 testing.  She notes one of her friends was recently exposed to somebody in her friends household that tested positive for COVID-19.  I advised that since the patient has not been directly exposed to somebody with COVID-19 or COVID-19 symptoms there is no need for her to get tested at this time.  Discussed that if her friend tests positive or develops any symptoms then the patient should be tested for COVID-19 5 to 7 days after her exposure.  If she develops any symptoms she can be tested again as well.   Health maintenance: Patient will be scheduled for an appointment for Pap smear.  She notes it has been many years since her last Pap smear.  No orders of the defined types were placed in this encounter.   Meds ordered this encounter  Medications  . hydrOXYzine  (ATARAX/VISTARIL) 25 MG tablet    Sig: Take 1 tablet (25 mg total) by mouth 3 (three) times daily as needed for anxiety.    Dispense:  30 tablet    Refill:  0     I discussed the assessment and treatment plan with the patient. The patient was provided an opportunity to ask questions and all were answered. The patient agreed with the plan and demonstrated an understanding of the instructions.   The patient was advised to call back or seek an in-person evaluation if the symptoms worsen or if the condition fails to improve as anticipated.   [name], MD

## 2019-02-19 NOTE — Assessment & Plan Note (Signed)
Improved.  She will continue her current regimen.  She will continue to see her therapist.  We will request records from them.

## 2019-02-19 NOTE — Progress Notes (Signed)
Subjective:   [name] is a 44 y.o. female who presents for Medicare Annual (Subsequent) preventive examination.  Review of Systems:  No ROS.  Medicare Wellness Virtual Visit.  Visual/audio telehealth visit, UTA vital signs.   Ht/Wt provided.  See social history for additional risk factors.         Objective:     Vitals: Ht 5\' 7"  (1.702 m)   Wt 159 lb (72.1 kg)   BMI 24.90 kg/m   Body mass index is 24.9 kg/m.  Advanced Directives 02/19/2019 06/27/2018 02/15/2018  Does Patient Have a Medical Advance Directive? No No No  Would patient like information on creating a medical advance directive? No - Patient declined No - Guardian declined Yes (MAU/Ambulatory/Procedural Areas - Information given)    Tobacco Social History   Tobacco Use  Smoking Status Current Every Day Smoker  Smokeless Tobacco Never Used     Ready to quit: Not Answered Counseling given: Not Answered   Clinical Intake:  Pre-visit preparation completed: Yes        Diabetes: No  How often do you need to have someone help you when you read instructions, pamphlets, or other written materials from your doctor or pharmacy?: 1 - Never  Interpreter Needed?: No     Past Medical History:  Diagnosis Date  . Allergic rhinitis   . Anxiety   . Asthma   . Charcot-Marie-Tooth disease   . Chickenpox   . Depression   . Lisfranc dislocation   . Muscular dystrophy ([place])   . Osteomyelitis ([place]) 07/16/2013  . Osteomyelitis of foot ([place])   . Stress incontinence    Past Surgical History:  Procedure Laterality Date  . ECTOPIC PREGNANCY SURGERY  1995  . Great toe amputation Left April 2011  . Great toe amputation Right October 2015  . TUBAL LIGATION  2005   Family History  Problem Relation Age of Onset  . Hyperlipidemia Father   . Hypertension Father   . Sudden death Sister        Pneumonia  . Diabetes Paternal Grandmother   . Muscular dystrophy Other        Mother, grandparents, other  relatives on maternal side   Social History   Socioeconomic History  . Marital status: Married    Spouse name: Not on file  . Number of children: Not on file  . Years of education: Not on file  . Highest education level: Not on file  Occupational History  . Not on file  Tobacco Use  . Smoking status: Current Every Day Smoker  . Smokeless tobacco: Never Used  Substance and Sexual Activity  . Alcohol use: Yes    Alcohol/week: 0.0 standard drinks    Comment: infrequent  . Drug use: Not Currently  . Sexual activity: Not Currently  Other Topics Concern  . Not on file  Social History Narrative  . Not on file   Social Determinants of Health   Financial Resource Strain:   . Difficulty of Paying Living Expenses: Not on file  Food Insecurity:   . Worried About Charity fundraiser in the Last Year: Not on file  . Ran Out of Food in the Last Year: Not on file  Transportation Needs:   . Lack of Transportation (Medical): Not on file  . Lack of Transportation (Non-Medical): Not on file  Physical Activity:   . Days of Exercise per Week: Not on file  . Minutes of Exercise per Session: Not on file  Stress:   . Feeling of Stress : Not on file  Social Connections:   . Frequency of Communication with Friends and Family: Not on file  . Frequency of Social Gatherings with Friends and Family: Not on file  . Attends Religious Services: Not on file  . Active Member of Clubs or Organizations: Not on file  . Attends Archivist Meetings: Not on file  . Marital Status: Not on file    Outpatient Encounter Medications as of 02/19/2019  Medication Sig  . albuterol (VENTOLIN HFA) 108 (90 Base) MCG/ACT inhaler INHALE 2 PUFFS EVERY 6 HOURS AS NEEDED FOR WHEEZE OR SHORTNESS OF BREATH  . escitalopram (LEXAPRO) 10 MG tablet TAKE 1 TABLET BY MOUTH EVERY DAY  . fluticasone (FLONASE) 50 MCG/ACT nasal spray Place 2 sprays into both nostrils daily.  . hydrOXYzine (ATARAX/VISTARIL) 25 MG tablet  TAKE 1 TABLET (25 MG TOTAL) BY MOUTH 3 (THREE) TIMES DAILY AS NEEDED FOR ANXIETY.  Marland Kitchen loratadine (CLARITIN) 10 MG tablet Take 1 tablet (10 mg total) by mouth daily.  . mirabegron ER (MYRBETRIQ) 50 MG TB24 tablet Take 1 tablet (50 mg total) by mouth daily.  . mirtazapine (REMERON) 15 MG tablet TAKE 1 TABLET BY MOUTH EVERYDAY AT BEDTIME   No facility-administered encounter medications on file as of 02/19/2019.    Activities of Daily Living In your present state of health, do you have any difficulty performing the following activities: 02/19/2019  Hearing? N  Vision? N  Difficulty concentrating or making decisions? Y  Walking or climbing stairs? N  Dressing or bathing? N  Doing errands, shopping? N  Preparing Food and eating ? N  Using the Toilet? N  In the past six months, have you accidently leaked urine? N  Do you have problems with loss of bowel control? N  Managing your Medications? N  Managing your Finances? N  Housekeeping or managing your Housekeeping? N  Some recent data might be hidden    Patient Care Team: Leone Haven, MD as PCP - General (Family Medicine)    Assessment:   This is a routine wellness examination for [name].  Nurse connected with patient 02/19/19 at 10:30 AM EST by a telephone enabled telemedicine application and verified that I am speaking with the correct person using two identifiers. Patient stated full name and DOB. Patient gave permission to continue with virtual visit. Patient's location was at home and Nurse's location was at [place] office.   Patient is alert and oriented x3. Patient notes difficulty focusing or concentrating.  Health Maintenance Due: -Tdap vaccine- to be completed with doctor in visit or local pharmacy.   See completed HM at the end of note.   Eye: Visual acuity not assessed. Virtual visit. Followed by their ophthalmologist.  Dental: UTD  Hearing: Demonstrates normal hearing during visit.  Safety:  Patient feels  safe at home- yes Patient does have smoke detectors at home- yes Patient does wear sunscreen or protective clothing when in direct sunlight - yes Patient does wear seat belt when in a moving vehicle - yes Patient drives- yes Adequate lighting in walkways free from debris- yes Grab bars and handrails used as appropriate- yes Ambulates with an assistive device- no Cell phone on person when ambulating outside of the home- yes  Social: Alcohol intake - no      Smoking history- current Smokers in home? none Illicit drug use? none  Medication: Taking as directed and without issues.  Self managed - yes  Covid-19: Precautions and sickness symptoms discussed. Wears mask, social distancing, hand hygiene as appropriate.   Activities of Daily Living Patient denies needing assistance with: household chores, feeding themselves, getting from bed to chair, getting to the toilet, bathing/showering, dressing, managing money, or preparing meals.   Discussed the importance of a healthy diet, water intake and the benefits of aerobic exercise.   Physical activity- active around the home.  Diet:  Regular Water: fair intake Caffeine: 2 cups of coffee  Other Providers Patient Care Team: Leone Haven, MD as PCP - General (Family Medicine)  Exercise Activities and Dietary recommendations Current Exercise Habits: Home exercise routine, Intensity: Mild  Goals      Patient Stated   . DIET - INCREASE WATER INTAKE (pt-stated)    . Maintain Healthy Lifestyle (pt-stated)     Stay active         Fall Risk Fall Risk  02/19/2019 11/27/2018 02/15/2018  Falls in the past year? 0 0 0  Number falls in past yr: - 0 -  Follow up Falls evaluation completed Falls evaluation completed -   Timed Get Up and Go performed: no, virtual visit  Depression Screen PHQ 2/9 Scores 02/19/2019 11/27/2018 02/15/2018 10/24/2017  PHQ - 2 Score - 0 5 6  PHQ- 9 Score - - 16 22  Exception Documentation Other- indicate  reason in comment box - - -  Not completed (No Data) - - -     Cognitive Function     6CIT Screen 02/19/2019 02/15/2018  What Year? 0 points 0 points  What month? 0 points 0 points  What time? 0 points 0 points  Count back from 20 0 points 0 points  Months in reverse 0 points 0 points  Repeat phrase 0 points 0 points  Total Score 0 0    Immunization History  Administered Date(s) Administered  . Influenza,inj,Quad PF,6+ Mos 09/17/2015, 01/26/2018, 11/27/2018   Screening Tests Health Maintenance  Topic Date Due  . HIV Screening  03/15/1988  . TETANUS/TDAP  03/15/1992  . PAP SMEAR-Modifier  03/15/1994  . INFLUENZA VACCINE  Completed      Plan:   Keep all routine maintenance appointments.   Follow up with your doctor today at 11:00.  Medicare Attestation I have personally reviewed: The patient's medical and social history Their use of alcohol, tobacco or illicit drugs Their current medications and supplements The patient's functional ability including ADLs,fall risks, home safety risks, cognitive, and hearing and visual impairment Diet and physical activities Evidence for depression   I have reviewed and discussed with patient certain preventive protocols, quality metrics, and best practice recommendations.      Varney Biles, LPN  [id number]

## 2019-02-19 NOTE — Assessment & Plan Note (Signed)
Symptoms have resolved.  She will monitor for any further symptoms.  She did have a negative COVID-19 testing.  She notes one of her friends was recently exposed to somebody in her friends household that tested positive for COVID-19.  I advised that since the patient has not been directly exposed to somebody with COVID-19 or COVID-19 symptoms there is no need for her to get tested at this time.  Discussed that if her friend tests positive or develops any symptoms then the patient should be tested for COVID-19 5 to 7 days after her exposure.  If she develops any symptoms she can be tested again as well.

## 2019-02-20 NOTE — Progress Notes (Signed)
I left a voicemail for the staff of Dr. Cheryln Manly to return a call back to me to get notes on this patient per Dr. Caryl Bis.  Keyonte Cookston,cma

## 2019-02-20 NOTE — Progress Notes (Signed)
Emailed the medical release today to the patient's email for her to sign and she will upload it to [place] and send it back to [name].  Ilhan Madan,cma

## 2019-02-22 NOTE — Progress Notes (Signed)
I have reviewed the above note and agree.  Kayman Snuffer, M.D.  

## 2019-03-06 ENCOUNTER — Ambulatory Visit: Payer: Medicare Other | Admitting: Psychology

## 2019-03-12 ENCOUNTER — Telehealth: Payer: Self-pay | Admitting: Family Medicine

## 2019-03-12 NOTE — Telephone Encounter (Signed)
I would suggest she complete a visit to determine appropriate treatment.

## 2019-03-12 NOTE — Telephone Encounter (Signed)
15 minutes is ok if we are just going to talk about her sciatica.

## 2019-03-12 NOTE — Telephone Encounter (Signed)
Pt called wanting to get some relief  for her sciatic nerve pain and was wanting a suggestion on a back braces or exercise to help with the pain. [name],cma

## 2019-03-12 NOTE — Telephone Encounter (Signed)
Pt called wanting to get some relief  for her sciatic nerve pain and was wanting a suggestion on a back braces or exercise to help with the pain

## 2019-03-12 NOTE — Telephone Encounter (Signed)
Do you want her in a 15 min slot that is all that's available ? Oliviya Gilkison,cma

## 2019-03-13 NOTE — Telephone Encounter (Signed)
Patient will call back and schedule a virtual visit for her sciatica pain per the provider she can be put in a 15 min. Slot this week.  Osvaldo Lamping,cma

## 2019-03-19 ENCOUNTER — Ambulatory Visit (INDEPENDENT_AMBULATORY_CARE_PROVIDER_SITE_OTHER): Payer: Medicare Other | Admitting: Psychology

## 2019-03-19 DIAGNOSIS — F4323 Adjustment disorder with mixed anxiety and depressed mood: Secondary | ICD-10-CM

## 2019-04-03 ENCOUNTER — Ambulatory Visit: Admitting: Family Medicine

## 2019-04-06 ENCOUNTER — Ambulatory Visit (INDEPENDENT_AMBULATORY_CARE_PROVIDER_SITE_OTHER): Payer: Medicare Other | Admitting: Psychology

## 2019-04-06 DIAGNOSIS — F4323 Adjustment disorder with mixed anxiety and depressed mood: Secondary | ICD-10-CM

## 2019-04-11 ENCOUNTER — Telehealth: Payer: Self-pay | Admitting: Family Medicine

## 2019-04-11 NOTE — Telephone Encounter (Signed)
error 

## 2019-04-16 ENCOUNTER — Ambulatory Visit: Payer: Medicare Other | Admitting: Psychology

## 2019-04-16 ENCOUNTER — Telehealth: Payer: Self-pay | Admitting: Family Medicine

## 2019-04-16 NOTE — Telephone Encounter (Signed)
She really needs to be seen today for this issue. I would advise urgent care for evaluation to determine if she needs an [name] to rule out DVT or if there is some other cause for her symptoms. Please call her and advise her of this.

## 2019-04-16 NOTE — Telephone Encounter (Signed)
Pt called in and wanted appt with Dr. Caryl Bis but her lower leg (calf) is swollen and red. It started night before last. I transferred call to [facility] with Access Nurse.

## 2019-04-16 NOTE — Telephone Encounter (Signed)
I called and explained to the patient that she really needed to be evaluated for a DVT and she needed to go to either urgent care or the ER, Patient stated she is waiting on a ride and she is going to the ER.  Angelice Piech,cma

## 2019-04-16 NOTE — Telephone Encounter (Signed)
Patient Name: MARQUESHA BEOUGHER Gender: Female DOB: August 10, 1975 Age: 44 Y 1 M 3 D Return Phone Number: [phone] (Primary) Address: City/State/Zip: [place] [address] Client [facility]  Station Day - [place] Client Site [place] - Day Physician [name] - MD Contact Type Call Who Is Calling Patient / Member / Family / Caregiver Call Type Triage / Clinical Relationship To Patient Self Return Phone Number [phone] (Primary) Chief Complaint Leg Swelling And Edema Reason for Call Symptomatic / Request for [place] states her lower calf is swollen and red. It started night before last. Translation No Nurse Assessment Nurse: Rock Nephew, RN, Juliann Pulse Date/Time (Eastern Time): 04/16/2019 12:19:03 PM Confirm and document reason for call. If symptomatic, describe symptoms. ---Caller states her lower calf ( left leg ) is swollen and red. It started night before last. Has the patient had close contact with a person known or suspected to have the novel coronavirus illness OR traveled / lives in area with major community spread (including international travel) in the last 14 days from the onset of symptoms? * If Asymptomatic, screen for exposure and travel within the last 14 days. ---No Does the patient have any new or worsening symptoms? ---Yes Will a triage be completed? ---Yes Related visit to physician within the last 2 weeks? ---No Does the PT have any chronic conditions? (i.e. diabetes, asthma, this includes High risk factors for pregnancy, etc.) ---No Is the patient pregnant or possibly pregnant? (Ask all females between the ages of 22-55) ---No Is this a behavioral health or substance abuse call? ---No Guidelines Guideline Title Affirmed Question Affirmed Notes Nurse Date/Time Eilene Ghazi Time) Leg Swelling and Edema [1] Red area or streak [2] large (> 2 in. or 5 cm) Rock Nephew, RN, Juliann Pulse 04/16/2019  12:19:57 PM Disp. Time (Eastern Time) Disposition Final UserPLEASE NOTE: All timestamps contained within this report are represented as [place] Standard Time. CONFIDENTIALTY NOTICE: This fax transmission is intended only for the addressee. It contains information that is legally privileged, confidential or otherwise protected from use or disclosure. If you are not the intended recipient, you are strictly prohibited from reviewing, disclosing, copying using or disseminating any of this information or taking any action in reliance on or regarding this information. If you have received this fax in error, please notify us immediately by telephone so that we can arrange for its return to [name]. Phone: [phone], Toll-Free: [phone], Fax: [phone] Page: 2 of 2 Call Id: [id number] 04/16/2019 12:22:43 PM See HCP within 4 Hours (or PCP triage) Yes Rock Nephew, RN, Gara Kroner Disagree/Comply Disagree Caller Understands Yes PreDisposition Call Doctor Care Advice Given Per Guideline SEE HCP WITHIN 4 HOURS (OR PCP TRIAGE): * IF OFFICE WILL BE OPEN: You need to be seen within the next 3 or 4 hours. Call your doctor (or NP/PA) now or as soon as the office opens. CALL BACK IF: * You become worse. CARE ADVICE given per Leg Swelling and Edema (Adult) guideline. Referrals GO TO FACILITY REFUSED

## 2019-04-17 ENCOUNTER — Ambulatory Visit (INDEPENDENT_AMBULATORY_CARE_PROVIDER_SITE_OTHER): Payer: Medicare Other | Admitting: Psychology

## 2019-04-17 DIAGNOSIS — F4323 Adjustment disorder with mixed anxiety and depressed mood: Secondary | ICD-10-CM | POA: Diagnosis not present

## 2019-05-08 ENCOUNTER — Other Ambulatory Visit: Payer: Self-pay

## 2019-05-08 ENCOUNTER — Ambulatory Visit (INDEPENDENT_AMBULATORY_CARE_PROVIDER_SITE_OTHER): Payer: Medicare Other | Admitting: Family Medicine

## 2019-05-08 ENCOUNTER — Other Ambulatory Visit (HOSPITAL_COMMUNITY)
Admission: RE | Admit: 2019-05-08 | Discharge: 2019-05-08 | Disposition: A | Discharge status: Home / Self Care | Payer: Medicare Other | Source: Ambulatory Visit | Attending: Family Medicine | Admitting: Family Medicine

## 2019-05-08 ENCOUNTER — Encounter: Payer: Self-pay | Admitting: Family Medicine

## 2019-05-08 VITALS — BP 105/70 | HR 85 | Temp 95.9°F | Ht 67.0 in | Wt 154.8 lb

## 2019-05-08 DIAGNOSIS — R825 Elevated urine levels of drugs, medicaments and biological substances: Secondary | ICD-10-CM

## 2019-05-08 DIAGNOSIS — Z1151 Encounter for screening for human papillomavirus (HPV): Secondary | ICD-10-CM | POA: Insufficient documentation

## 2019-05-08 DIAGNOSIS — Z124 Encounter for screening for malignant neoplasm of cervix: Secondary | ICD-10-CM | POA: Insufficient documentation

## 2019-05-08 DIAGNOSIS — Z800 Family history of malignant neoplasm of digestive organs: Secondary | ICD-10-CM

## 2019-05-08 DIAGNOSIS — G7100 Muscular dystrophy, unspecified: Secondary | ICD-10-CM | POA: Diagnosis not present

## 2019-05-08 DIAGNOSIS — G600 Hereditary motor and sensory neuropathy: Secondary | ICD-10-CM | POA: Diagnosis not present

## 2019-05-08 DIAGNOSIS — Z1231 Encounter for screening mammogram for malignant neoplasm of breast: Secondary | ICD-10-CM

## 2019-05-08 NOTE — Assessment & Plan Note (Signed)
Needs to reestablish with orthopedics.  Will refer her locally.

## 2019-05-08 NOTE — Patient Instructions (Signed)
Nice to see you. Please call to schedule your mammogram. We will contact you with your Pap smear results. Please call to reschedule your colonoscopy.  This is very important given your family history of colon cancer.

## 2019-05-08 NOTE — Assessment & Plan Note (Signed)
Patient's brother had colon cancer.  I discussed the importance of her having this completed.

## 2019-05-08 NOTE — Progress Notes (Signed)
[name], MD Phone: [phone]  [name] is a 44 y.o. female who presents today for f/u.  Pap smear needed. Mammogram needed.  Family history of colon cancer: patient has not had a colonoscopy yet. She notes she needs to call to schedule this.  Charcot-Marie-Tooth/muscular dystrophy: Patient reports a history of these things.  She has not seen a specialist in quite some time.  She was previously following with orthopedics and a pain specialist.  She wonders about bracing options for her feet.  Social History   Tobacco Use  Smoking Status Current Every Day Smoker  Smokeless Tobacco Never Used     ROS see history of present illness  Objective  Physical Exam Vitals:   05/08/19 1254  BP: 105/70  Pulse: 85  Temp: (!) 95.9 F (35.5 C)  SpO2: 96%    BP Readings from Last 3 Encounters:  05/08/19 105/70  02/12/19 (!) 134/95  11/27/18 130/80   Wt Readings from Last 3 Encounters:  05/08/19 154 lb 12.8 oz (70.2 kg)  02/19/19 157 lb (71.2 kg)  02/12/19 159 lb (72.1 kg)    Physical Exam Constitutional:      General: She is not in acute distress. Genitourinary:    Comments: [name] CMA served as chaperone, normal labia, normal vaginal mucosa, normal-appearing cervix, no cervical motion tenderness, no adnexal masses or tenderness Neurological:     Mental Status: She is alert.      Assessment/Plan: Please see individual problem list.  Family history of colon cancer Patient's brother had colon cancer.  I discussed the importance of her having this completed.  Positive urine drug screen Patient requested a refill of Valium which she has gotten previously.  I discussed that given her prior positive urine drug screen I would not provide any additional controlled substances.  Cervical cancer screening Pap smear completed.  Breast cancer screening by mammogram Mammogram ordered.  Muscular dystrophy ([place]) Needs to establish with a local  specialist.  Referral to neurology placed.  Charcot-Marie-Tooth disease Needs to reestablish with orthopedics.  Will refer her locally.   Orders Placed This Encounter  Procedures  . MM 3D SCREEN BREAST BILATERAL    Standing Status:   Future    Standing Expiration Date:   07/07/2020    Order Specific Question:   Reason for Exam (SYMPTOM  OR DIAGNOSIS REQUIRED)    Answer:   Breast cancer screening    Order Specific Question:   Is the patient pregnant?    Answer:   No    Order Specific Question:   Preferred imaging location?    Answer:   [facility]  . Ambulatory referral to Neurology    Referral Priority:   Routine    Referral Type:   Consultation    Referral Reason:   Specialty Services Required    Requested Specialty:   Neurology    Number of Visits Requested:   1  . Ambulatory referral to Orthopedic Surgery    Referral Priority:   Routine    Referral Type:   Surgical    Referral Reason:   Specialty Services Required    Requested Specialty:   Orthopedic Surgery    Number of Visits Requested:   1    No orders of the defined types were placed in this encounter.   This visit occurred during the SARS-CoV-2 public health emergency.  Safety protocols were in place, including screening questions prior to the visit, additional usage of staff PPE, and extensive cleaning  of exam room while observing appropriate contact time as indicated for disinfecting solutions.    [name], MD [place]

## 2019-05-08 NOTE — Assessment & Plan Note (Signed)
Needs to establish with a local specialist.  Referral to neurology placed.

## 2019-05-08 NOTE — Assessment & Plan Note (Signed)
Patient requested a refill of Valium which she has gotten previously.  I discussed that given her prior positive urine drug screen I would not provide any additional controlled substances.

## 2019-05-08 NOTE — Assessment & Plan Note (Signed)
Mammogram ordered

## 2019-05-08 NOTE — Assessment & Plan Note (Signed)
Pap smear completed

## 2019-05-09 ENCOUNTER — Telehealth: Payer: Self-pay | Admitting: Family Medicine

## 2019-05-09 NOTE — Telephone Encounter (Signed)
Lvm to schedule 3m follow up °

## 2019-05-10 ENCOUNTER — Telehealth: Payer: Self-pay

## 2019-05-10 LAB — CYTOLOGY - PAP
Comment: NEGATIVE
Diagnosis: NEGATIVE
High risk HPV: NEGATIVE

## 2019-05-10 NOTE — Telephone Encounter (Signed)
That is fine with me. Please call the patient and let her know that and then we can refer her back to [facility].

## 2019-05-11 ENCOUNTER — Telehealth: Payer: Self-pay | Admitting: Family Medicine

## 2019-05-11 NOTE — Telephone Encounter (Signed)
Called [facility] to let them know that it is being recommended that she see some one at [facility] or [facility] due to her medical history, 05/10/19." [facility] said about 2 hours ago

## 2019-05-11 NOTE — Telephone Encounter (Signed)
I called and left a VM for the patient to call back to inform her that [facility] called and stated that due to her medical history she needed to be referred to a bigger facility and Dr. Caryl Bis stated he would refer the patient back to [facility] if she was willing to go.  Yessica Putnam,cma

## 2019-05-14 ENCOUNTER — Telehealth: Payer: Self-pay | Admitting: Family Medicine

## 2019-05-14 ENCOUNTER — Ambulatory Visit
Admission: EM | Admit: 2019-05-14 | Discharge: 2019-05-14 | Disposition: A | Discharge status: Home / Self Care | Payer: Medicare Other | Attending: Emergency Medicine | Admitting: Emergency Medicine

## 2019-05-14 ENCOUNTER — Other Ambulatory Visit: Payer: Self-pay

## 2019-05-14 DIAGNOSIS — Z20822 Contact with and (suspected) exposure to covid-19: Secondary | ICD-10-CM | POA: Diagnosis not present

## 2019-05-14 DIAGNOSIS — R05 Cough: Secondary | ICD-10-CM

## 2019-05-14 DIAGNOSIS — G7100 Muscular dystrophy, unspecified: Secondary | ICD-10-CM

## 2019-05-14 DIAGNOSIS — R059 Cough, unspecified: Secondary | ICD-10-CM

## 2019-05-14 LAB — POC SARS CORONAVIRUS 2 AG -  ED: SARS Coronavirus 2 Ag: NEGATIVE

## 2019-05-14 NOTE — Telephone Encounter (Signed)
I called and spoke with the patient and explained that she would need to go back to [facility] to see a neurologist and she was okay with that.  Orval Dortch,cma

## 2019-05-14 NOTE — Telephone Encounter (Signed)
-----   Message from Ashley Jacobs sent at 05/09/2019  4:54 PM EDT ----- Regarding: RE: referral Neurology. ----- Message ----- From: Leone Haven, MD Sent: 05/09/2019   5:55 AM EDT To: Ashley Jacobs Subject: RE: referral                                   Which referral was this response for? Orthopedics or Neurology? ----- Message ----- From: Ashley Jacobs Sent: 05/08/2019   4:41 PM EDT To: Leone Haven, MD Subject: referral                                       Good afternoon!  Per ofc We do not see this Dx thank you for the referral

## 2019-05-14 NOTE — ED Triage Notes (Addendum)
Pt is here with a cough, nasal congestion since Thursday, her doctor sent her here today. Pt states she has a hx of allergies, pt has taken Flonase to relieve discomfort.

## 2019-05-14 NOTE — Telephone Encounter (Signed)
Please let the patient know that the local neurologist does not see patients with muscular dystrophy. I would recommend that we send her back to [facility] for this and I have placed a referral. Thanks.

## 2019-05-14 NOTE — ED Provider Notes (Signed)
[name]    CSN: [id number] Arrival date & time: 05/14/19  1224      History   Chief Complaint Chief Complaint  Patient presents with  . Cough  . Nasal Congestion    HPI Analy D[place] [name] is a 44 y.o. female.   Patient presents with request for a COVID test; she was sent here by her PCP.  She reports cough and nasal congestion x 4-5 days.  She denies fever, chills, sore throat, shortness of breath, vomiting, diarrhea, rash, or other symptoms.  Treatment attempted at home with Flonase.  Patient is a current everyday smoker.  The history is provided by the patient.    Past Medical History:  Diagnosis Date  . Allergic rhinitis   . Allergies   . Allergies 1980  . Anxiety   . Asthma   . Charcot-Marie-Tooth disease   . Chickenpox   . Depression   . Lisfranc dislocation   . Muscular dystrophy ([place])   . Osteomyelitis ([place]) 07/16/2013  . Osteomyelitis of foot ([place])   . Stress incontinence     Patient Active Problem List   Diagnosis Date Noted  . Cervical cancer screening 05/08/2019  . Breast cancer screening by mammogram 05/08/2019  . Respiratory illness 02/19/2019  . Tennis elbow 11/27/2018  . Positive urine drug screen 07/15/2018  . Closed left ankle fracture 06/14/2018  . Attention deficit 05/08/2018  . Family history of colon cancer 02/15/2018  . Vitamin D deficiency 09/17/2015  . Muscular dystrophy ([place]) 03/26/2015  . Neuropathic pain 03/26/2015  . Irregular periods 02/26/2015  . Allergic rhinitis 02/26/2015  . Tooth pain 01/08/2015  . Anxiety and depression 12/26/2014  . Charcot-Marie-Tooth disease 12/26/2014  . Stress incontinence 12/26/2014  . Iron deficiency 12/26/2014  . Current tobacco use 07/11/2013  . Chronic pain 08/25/2012  . Foot pain 08/25/2012  . Other long term (current) drug therapy 09/30/2011  . 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 Arthritis, neuropathic 03/18/2011  . Closed dislocation of tarsometatarsal  joint 03/18/2011  . Acquired deformities of toe 03/04/2011  . 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 ECTOPIC PREGNANCY SURGERY  1995  . Great toe amputation Left April 2011  . Great toe amputation Right October 2015  . TUBAL LIGATION  2005    OB History   No obstetric history on file.      Home Medications    Prior to Admission medications   Medication Sig Start Date End Date Taking? Authorizing Provider  albuterol (VENTOLIN HFA) 108 (90 Base) MCG/ACT inhaler INHALE 2 PUFFS EVERY 6 HOURS AS NEEDED FOR WHEEZE OR SHORTNESS OF BREATH 02/05/19   Burnard Hawthorne, FNP  escitalopram (LEXAPRO) 10 MG tablet TAKE 1 TABLET BY MOUTH EVERY DAY 01/03/19   Leone Haven, MD  fluticasone (FLONASE) 50 MCG/ACT nasal spray Place 2 sprays into both nostrils daily. 05/08/18   Leone Haven, MD  hydrOXYzine (ATARAX/VISTARIL) 25 MG tablet Take 1 tablet (25 mg total) by mouth 3 (three) times daily as needed for anxiety. 02/19/19   Leone Haven, MD  loratadine (CLARITIN) 10 MG tablet Take 1 tablet (10 mg total) by mouth daily. 08/02/18   Burnard Hawthorne, FNP  mirabegron ER (MYRBETRIQ) 50 MG TB24 tablet Take 1 tablet (50 mg total) by mouth daily. 01/02/19   Bjorn Loser, MD  mirtazapine (REMERON) 15 MG tablet TAKE 1 TABLET BY MOUTH EVERYDAY AT BEDTIME 07/16/18  [provider]    Family History Family History  Problem Relation Age of Onset  . Hyperlipidemia Father   . Hypertension Father   . Sudden death Sister        Pneumonia  . Diabetes Paternal Grandmother   . Muscular dystrophy Other        Mother, grandparents, other relatives on maternal side  . Healthy Mother     Social History Social History   Tobacco Use  . Smoking status: Current Every Day Smoker    Types: Cigarettes  . Smokeless tobacco: Never Used  Substance Use Topics  . Alcohol use: Yes    Alcohol/week: 0.0 standard drinks     Comment: infrequent  . Drug use: Not Currently     Allergies   Bee venom, Vancomycin, Penicillins, Sulfamethoxazole-trimethoprim, Tramadol, and Clindamycin   Review of Systems Review of Systems  Constitutional: Negative for chills and fever.  HENT: Positive for congestion. Negative for ear pain and sore throat.   Eyes: Negative for pain and visual disturbance.  Respiratory: Positive for cough. Negative for shortness of breath.   Cardiovascular: Negative for chest pain and palpitations.  Gastrointestinal: Negative for abdominal pain, diarrhea, nausea and vomiting.  Genitourinary: Negative for dysuria and hematuria.  Musculoskeletal: Negative for arthralgias and back pain.  Skin: Negative for color change and rash.  Neurological: Negative for seizures and syncope.  All other systems reviewed and are negative.    Physical Exam Triage Vital Signs ED Triage Vitals  Enc Vitals Group     BP      Pulse      Resp      Temp      Temp src      SpO2      Weight      Height      Head Circumference      Peak Flow      Pain Score      Pain Loc      Pain Edu?      Excl. in [place]?    No data found.  Updated Vital Signs BP 117/79 (BP Location: Left Arm)   Pulse 88   Temp 99.6 F (37.6 C) (Oral)   Resp 18   SpO2 93%   Visual Acuity Right Eye Distance:   Left Eye Distance:   Bilateral Distance:    Right Eye Near:   Left Eye Near:    Bilateral Near:     Physical Exam Vitals and nursing note reviewed.  Constitutional:      General: She is not in acute distress.    Appearance: She is well-developed.  HENT:     Head: Normocephalic and atraumatic.     Mouth/Throat:     Mouth: Mucous membranes are moist.  Eyes:     Conjunctiva/sclera: Conjunctivae normal.  Cardiovascular:     Rate and Rhythm: Normal rate and regular rhythm.     Heart sounds: No murmur.  Pulmonary:     Effort: Pulmonary effort is normal. No respiratory distress.     Breath sounds: Normal breath  sounds. No wheezing.  Abdominal:     Palpations: Abdomen is soft.     Tenderness: There is no abdominal tenderness. There is no guarding or rebound.  Musculoskeletal:     Cervical back: Neck supple.  Skin:    General: Skin is warm and dry.     Findings: No rash.  Neurological:     General: No focal deficit present.  Mental Status: She is alert and oriented to person, place, and time.  Psychiatric:        Mood and Affect: Mood normal.        Behavior: Behavior normal.      UC Treatments / Results  Labs (all labs ordered are listed, but only abnormal results are displayed) Labs Reviewed  NOVEL CORONAVIRUS, NAA  POC SARS CORONAVIRUS 2 AG -  ED    EKG   Radiology No results found.  Procedures Procedures (including critical care time)  Medications Ordered in UC Medications - No data to display  Initial Impression / Assessment and Plan / UC Course  I have reviewed the triage vital signs and the nursing notes.  Pertinent labs & imaging results that were available during my care of the patient were reviewed by me and considered in my medical decision making (see chart for details).   Cough.  POC COVID negative; PCR pending.  Instructed patient to self quarantine until the test result is back and to take Tylenol as needed for fever/discomfort.  Instructed patient to go to the emergency department if develops high fever, shortness of breath, severe diarrhea, or other concerning symptoms.  Patient agrees with plan of care.    Final Clinical Impressions(s) / UC Diagnoses   Final diagnoses:  Cough     Discharge Instructions     Your COVID test is pending.  You should self quarantine until the test result is back.    Take Tylenol as needed for fever or discomfort.  Rest and keep yourself hydrated.    Go to the emergency department if you develop shortness of breath, severe diarrhea, high fever not relieved by Tylenol or ibuprofen, or other concerning symptoms.        ED Prescriptions    None     PDMP not reviewed this encounter.   Sharion Balloon, NP 05/14/19 1314

## 2019-05-14 NOTE — Discharge Instructions (Addendum)
Your COVID test is pending.  You should self quarantine until the test result is back.    Take Tylenol as needed for fever or discomfort.  Rest and keep yourself hydrated.    Go to the emergency department if you develop shortness of breath, severe diarrhea, high fever not relieved by Tylenol or ibuprofen, or other concerning symptoms.    

## 2019-05-15 ENCOUNTER — Encounter: Payer: Self-pay | Admitting: Family Medicine

## 2019-05-16 ENCOUNTER — Other Ambulatory Visit: Payer: Self-pay | Admitting: Family

## 2019-05-16 DIAGNOSIS — R05 Cough: Secondary | ICD-10-CM

## 2019-05-16 DIAGNOSIS — R058 Other specified cough: Secondary | ICD-10-CM

## 2019-05-16 LAB — NOVEL CORONAVIRUS, NAA: SARS-CoV-2, NAA: NOT DETECTED

## 2019-05-16 LAB — SARS-COV-2, NAA 2 DAY TAT

## 2019-05-16 MED ORDER — DOXYCYCLINE HYCLATE 100 MG PO TABS: 100.0000 mg | ORAL_TABLET | Freq: Two times a day (BID) | ORAL | 0 refills | Status: DC

## 2019-05-17 MED ORDER — FLUCONAZOLE 150 MG PO TABS
150.0000 mg | ORAL_TABLET | Freq: Once | ORAL | 0 refills | Status: AC
Start: 2019-05-17 — End: 2019-05-17

## 2019-05-17 NOTE — Addendum Note (Signed)
Addended by: Leone Haven on: 05/17/2019 08:47 AM   Modules accepted: Orders

## 2019-05-18 NOTE — Telephone Encounter (Signed)
I called and informed the patient and she is ok with going to [facility] and the provider was notified.  Trey Bebee,cma

## 2019-05-21 DIAGNOSIS — H04123 Dry eye syndrome of bilateral lacrimal glands: Secondary | ICD-10-CM | POA: Diagnosis not present

## 2019-06-09 DIAGNOSIS — Z03818 Encounter for observation for suspected exposure to other biological agents ruled out: Secondary | ICD-10-CM | POA: Diagnosis not present

## 2019-06-09 DIAGNOSIS — Z20828 Contact with and (suspected) exposure to other viral communicable diseases: Secondary | ICD-10-CM | POA: Diagnosis not present

## 2019-06-11 ENCOUNTER — Ambulatory Visit: Payer: Medicare Other | Admitting: Urology

## 2019-06-22 ENCOUNTER — Ambulatory Visit: Payer: Medicare Other | Admitting: Psychology

## 2019-06-25 ENCOUNTER — Ambulatory Visit (INDEPENDENT_AMBULATORY_CARE_PROVIDER_SITE_OTHER): Payer: Medicare Other | Admitting: Psychology

## 2019-06-25 DIAGNOSIS — F4323 Adjustment disorder with mixed anxiety and depressed mood: Secondary | ICD-10-CM

## 2019-07-11 ENCOUNTER — Encounter: Payer: Self-pay | Admitting: Family Medicine

## 2019-07-24 ENCOUNTER — Ambulatory Visit: Payer: Medicare Other | Admitting: Psychology

## 2019-08-02 ENCOUNTER — Ambulatory Visit (INDEPENDENT_AMBULATORY_CARE_PROVIDER_SITE_OTHER): Payer: Medicare Other | Admitting: Psychology

## 2019-08-02 DIAGNOSIS — F4323 Adjustment disorder with mixed anxiety and depressed mood: Secondary | ICD-10-CM | POA: Diagnosis not present

## 2019-08-08 ENCOUNTER — Ambulatory Visit (INDEPENDENT_AMBULATORY_CARE_PROVIDER_SITE_OTHER): Payer: Medicare Other | Admitting: Psychology

## 2019-08-08 DIAGNOSIS — F4323 Adjustment disorder with mixed anxiety and depressed mood: Secondary | ICD-10-CM | POA: Diagnosis not present

## 2019-08-10 ENCOUNTER — Other Ambulatory Visit: Payer: Self-pay | Admitting: Family Medicine

## 2019-08-10 DIAGNOSIS — F419 Anxiety disorder, unspecified: Secondary | ICD-10-CM

## 2019-08-16 ENCOUNTER — Ambulatory Visit
Admission: RE | Admit: 2019-08-16 | Discharge: 2019-08-16 | Disposition: A | Discharge status: Home / Self Care | Payer: Medicare Other | Source: Ambulatory Visit | Attending: Family Medicine | Admitting: Family Medicine

## 2019-08-16 DIAGNOSIS — Z1231 Encounter for screening mammogram for malignant neoplasm of breast: Secondary | ICD-10-CM | POA: Diagnosis not present

## 2019-09-20 ENCOUNTER — Ambulatory Visit: Payer: Medicare Other | Admitting: Psychology

## 2019-09-21 ENCOUNTER — Ambulatory Visit (INDEPENDENT_AMBULATORY_CARE_PROVIDER_SITE_OTHER): Payer: Medicare Other | Admitting: Psychology

## 2019-09-21 DIAGNOSIS — F4323 Adjustment disorder with mixed anxiety and depressed mood: Secondary | ICD-10-CM

## 2019-10-04 ENCOUNTER — Other Ambulatory Visit: Payer: Self-pay | Admitting: Family Medicine

## 2019-10-04 DIAGNOSIS — R058 Other specified cough: Secondary | ICD-10-CM

## 2019-10-06 DIAGNOSIS — Z20822 Contact with and (suspected) exposure to covid-19: Secondary | ICD-10-CM | POA: Diagnosis not present

## 2019-10-23 ENCOUNTER — Ambulatory Visit (INDEPENDENT_AMBULATORY_CARE_PROVIDER_SITE_OTHER): Payer: Medicare Other | Admitting: Psychology

## 2019-10-23 DIAGNOSIS — F4323 Adjustment disorder with mixed anxiety and depressed mood: Secondary | ICD-10-CM | POA: Diagnosis not present

## 2019-10-29 ENCOUNTER — Encounter: Payer: Self-pay | Admitting: Family Medicine

## 2019-10-29 ENCOUNTER — Other Ambulatory Visit: Payer: Self-pay

## 2019-10-29 ENCOUNTER — Telehealth (INDEPENDENT_AMBULATORY_CARE_PROVIDER_SITE_OTHER): Payer: Medicare Other | Admitting: Family Medicine

## 2019-10-29 DIAGNOSIS — F419 Anxiety disorder, unspecified: Secondary | ICD-10-CM | POA: Diagnosis not present

## 2019-10-29 DIAGNOSIS — F32A Depression, unspecified: Secondary | ICD-10-CM | POA: Diagnosis not present

## 2019-10-29 DIAGNOSIS — Z800 Family history of malignant neoplasm of digestive organs: Secondary | ICD-10-CM | POA: Diagnosis not present

## 2019-10-29 DIAGNOSIS — R41840 Attention and concentration deficit: Secondary | ICD-10-CM | POA: Diagnosis not present

## 2019-10-29 MED ORDER — ESCITALOPRAM OXALATE 20 MG PO TABS: 20.0000 mg | ORAL_TABLET | Freq: Every day | ORAL | 1 refills | Status: DC

## 2019-10-29 NOTE — Assessment & Plan Note (Signed)
History of this in the past.  She was referred for testing previously though has not had this completed.  We will place another referral to see if there is an alternative location for her to be tested

## 2019-10-29 NOTE — Assessment & Plan Note (Signed)
Patient has yet to have a colonoscopy.  Discussed the importance of this.  We will place another referral.

## 2019-10-29 NOTE — Progress Notes (Signed)
Virtual Visit via video Note  This visit type was conducted due to national recommendations for restrictions regarding the COVID-19 pandemic (e.g. social distancing).  This format is felt to be most appropriate for this patient at this time.  All issues noted in this document were discussed and addressed.  No physical exam was performed (except for noted visual exam findings with Video Visits).   I connected with [name] today at  2:15 PM EDT by a video enabled telemedicine application and verified that I am speaking with the correct person using two identifiers. Location patient: home Location provider: work  Persons participating in the virtual visit: patient, provider  I discussed the limitations, risks, security and privacy concerns of performing an evaluation and management service by telephone and the availability of in person appointments. I also discussed with the patient that there may be a patient responsible charge related to this service. The patient expressed understanding and agreed to proceed.  Reason for visit: f/u.  HPI: Anxiety/depression: Patient notes she has a lot going on in her life right now.  Her husband previously passed away and his birthday and their anniversary were recently and she is also having to move out of the house that they lived in together for 20 years.  She notes all that is difficult for her.  She continues to cry most days.  She continues to see Dr. Cheryln Manly for therapy and notes that is very helpful.  She wonders if the medication she is on is just not doing quite as much as it used to.  No SI.  History of ADHD: She notes there was a year-long waiting list for ADHD testing through [facility].  Family history of colon cancer: This was in her brother.  She has yet to have her colonoscopy.  No blood in her stool.   ROS: See pertinent positives and negatives per HPI.  Past Medical History:  Diagnosis Date  . Allergic rhinitis   . Allergies     . Allergies 1980  . Anxiety   . Asthma   . Charcot-Marie-Tooth disease   . Chickenpox   . Depression   . Lisfranc dislocation   . Muscular dystrophy ([place])   . Osteomyelitis ([place]) 07/16/2013  . Osteomyelitis of foot ([place])   . Stress incontinence     Past Surgical History:  Procedure Laterality Date  . ECTOPIC PREGNANCY SURGERY  1995  . Great toe amputation Left April 2011  . Great toe amputation Right October 2015  . TUBAL LIGATION  2005    Family History  Problem Relation Age of Onset  . Hyperlipidemia Father   . Hypertension Father   . Sudden death Sister        Pneumonia  . Diabetes Paternal Grandmother   . Muscular dystrophy Other        Mother, grandparents, other relatives on maternal side  . Healthy Mother   . Breast cancer Neg Hx     SOCIAL HX: Smoker   Current Outpatient Medications:  .  albuterol (VENTOLIN HFA) 108 (90 Base) MCG/ACT inhaler, INHALE 2 PUFFS EVERY 6 HOURS AS NEEDED FOR WHEEZE OR SHORTNESS OF BREATH, Disp: 18 each, Rfl: 1 .  escitalopram (LEXAPRO) 20 MG tablet, Take 1 tablet (20 mg total) by mouth daily., Disp: 90 tablet, Rfl: 1 .  fluticasone (FLONASE) 50 MCG/ACT nasal spray, Place 2 sprays into both nostrils daily., Disp: 16 g, Rfl: 6 .  hydrOXYzine (ATARAX/VISTARIL) 25 MG tablet, Take 1 tablet (25 mg  total) by mouth 3 (three) times daily as needed for anxiety., Disp: 30 tablet, Rfl: 0 .  loratadine (CLARITIN) 10 MG tablet, Take 1 tablet (10 mg total) by mouth daily., Disp: 30 tablet, Rfl: 5 .  mirabegron ER (MYRBETRIQ) 50 MG TB24 tablet, Take 1 tablet (50 mg total) by mouth daily., Disp: 90 tablet, Rfl: 3 .  mirtazapine (REMERON) 15 MG tablet, TAKE 1 TABLET BY MOUTH EVERYDAY AT BEDTIME, Disp: , Rfl:   EXAM:  VITALS per patient if applicable:  GENERAL: alert, oriented, appears well and in no acute distress  HEENT: atraumatic, conjunttiva clear, no obvious abnormalities on inspection of external nose and ears  NECK: normal movements of  the head and neck  LUNGS: on inspection no signs of respiratory distress, breathing rate appears normal, no obvious gross SOB, gasping or wheezing  CV: no obvious cyanosis  MS: moves all visible extremities without noticeable abnormality  PSYCH/NEURO: pleasant and cooperative, no obvious depression or anxiety, speech and thought processing grossly intact  ASSESSMENT AND PLAN:  Discussed the following assessment and plan:  Problem List Items Addressed This Visit    Anxiety and depression    Worsened recently with several triggers and life changes.  Discussed continuing with therapy.  We will increase her Lexapro to 20 mg once daily.  Advised to let us know if she notices any side effects with this or if she has worsening depression.  We will see her back in 2 months.      Relevant Medications   escitalopram (LEXAPRO) 20 MG tablet   Attention deficit    History of this in the past.  She was referred for testing previously though has not had this completed.  We will place another referral to see if there is an alternative location for her to be tested      Relevant Orders   Ambulatory referral to Psychology   Family history of colon cancer    Patient has yet to have a colonoscopy.  Discussed the importance of this.  We will place another referral.      Relevant Orders   Ambulatory referral to Gastroenterology    Other Visit Diagnoses    Anxiety disorder, unspecified       Relevant Medications   escitalopram (LEXAPRO) 20 MG tablet       I discussed the assessment and treatment plan with the patient. The patient was provided an opportunity to ask questions and all were answered. The patient agreed with the plan and demonstrated an understanding of the instructions.   The patient was advised to call back or seek an in-person evaluation if the symptoms worsen or if the condition fails to improve as anticipated.   [name], MD

## 2019-10-29 NOTE — Assessment & Plan Note (Signed)
Worsened recently with several triggers and life changes.  Discussed continuing with therapy.  We will increase her Lexapro to 20 mg once daily.  Advised to let us know if she notices any side effects with this or if she has worsening depression.  We will see her back in 2 months.

## 2019-11-02 ENCOUNTER — Ambulatory Visit (INDEPENDENT_AMBULATORY_CARE_PROVIDER_SITE_OTHER): Payer: Medicare Other | Admitting: Psychology

## 2019-11-02 DIAGNOSIS — F4323 Adjustment disorder with mixed anxiety and depressed mood: Secondary | ICD-10-CM | POA: Diagnosis not present

## 2019-11-07 ENCOUNTER — Encounter: Payer: Self-pay | Admitting: *Deleted

## 2019-11-08 ENCOUNTER — Telehealth: Payer: Self-pay | Admitting: Family Medicine

## 2019-11-08 NOTE — Telephone Encounter (Signed)
Rejection Reason - Patient did not respond" [facility] said on Nov 08, 2019 10:23 AM  Mailed "unable to contact" letter to home and referring provider.

## 2019-11-09 NOTE — Telephone Encounter (Signed)
Patient needs to call GI to set up a colonoscopy. They have been trying to get in touch with her to set up an appointment for colonoscopy.

## 2019-11-20 NOTE — Telephone Encounter (Signed)
I called and LVM for the patient to call back. [name],cma   

## 2019-11-30 DIAGNOSIS — Z20822 Contact with and (suspected) exposure to covid-19: Secondary | ICD-10-CM | POA: Diagnosis not present

## 2019-12-05 ENCOUNTER — Ambulatory Visit (INDEPENDENT_AMBULATORY_CARE_PROVIDER_SITE_OTHER): Payer: Medicare Other | Admitting: Psychology

## 2019-12-05 DIAGNOSIS — F4323 Adjustment disorder with mixed anxiety and depressed mood: Secondary | ICD-10-CM | POA: Diagnosis not present

## 2019-12-17 NOTE — Telephone Encounter (Signed)
Left message to call back  

## 2019-12-18 ENCOUNTER — Ambulatory Visit (INDEPENDENT_AMBULATORY_CARE_PROVIDER_SITE_OTHER): Payer: Medicare Other | Admitting: Psychology

## 2019-12-18 DIAGNOSIS — F329 Major depressive disorder, single episode, unspecified: Secondary | ICD-10-CM | POA: Diagnosis not present

## 2019-12-24 NOTE — Telephone Encounter (Signed)
LMTCB and also letter sent to patient

## 2019-12-26 ENCOUNTER — Telehealth: Payer: Self-pay

## 2019-12-26 NOTE — Telephone Encounter (Signed)
I called and informed the patient that she would need to be evaluated, she scheduled a VV with the provider becaseu she does have a cough.  [name],cma

## 2019-12-26 NOTE — Telephone Encounter (Signed)
Pt states that she no longer has a podiatrist. Her right great toe amputation site is cracked and she needs ointment for it. There are no appts available at this time for this week. Please call back to advise

## 2019-12-26 NOTE — Telephone Encounter (Signed)
Patient stated that her rt foot had a toe amputated years ago and it is starting to crack really bad, like a callus or dry skin and she wanted to know if you could suggest or prescribe an ointment to put on it.  Please advise.  Taeler Winning,cma

## 2019-12-26 NOTE — Telephone Encounter (Signed)
She really would need to have an evaluation for this to help determine appropriate treatment. I can see her in the office or we could have her see podiatry for it.

## 2019-12-28 ENCOUNTER — Telehealth: Admitting: Family Medicine

## 2019-12-31 ENCOUNTER — Encounter: Payer: Self-pay | Admitting: Family Medicine

## 2019-12-31 ENCOUNTER — Other Ambulatory Visit: Payer: Self-pay

## 2019-12-31 ENCOUNTER — Telehealth (INDEPENDENT_AMBULATORY_CARE_PROVIDER_SITE_OTHER): Payer: Medicare Other | Admitting: Family Medicine

## 2019-12-31 DIAGNOSIS — R41840 Attention and concentration deficit: Secondary | ICD-10-CM

## 2019-12-31 DIAGNOSIS — F32A Depression, unspecified: Secondary | ICD-10-CM | POA: Diagnosis not present

## 2019-12-31 DIAGNOSIS — M2060 Acquired deformities of toe(s), unspecified, unspecified foot: Secondary | ICD-10-CM | POA: Diagnosis not present

## 2019-12-31 DIAGNOSIS — F419 Anxiety disorder, unspecified: Secondary | ICD-10-CM | POA: Diagnosis not present

## 2019-12-31 NOTE — Progress Notes (Signed)
Virtual Visit via telephone Note  This visit type was conducted due to national recommendations for restrictions regarding the COVID-19 pandemic (e.g. social distancing).  This format is felt to be most appropriate for this patient at this time.  All issues noted in this document were discussed and addressed.  No physical exam was performed (except for noted visual exam findings with Video Visits).   I connected with [name] today at  4:00 PM EST by a video enabled telemedicine application or telephone and verified that I am speaking with the correct person using two identifiers. Location patient: home Location provider: work  Persons participating in the virtual visit: patient, provider  I discussed the limitations, risks, security and privacy concerns of performing an evaluation and management service by telephone and the availability of in person appointments. I also discussed with the patient that there may be a patient responsible charge related to this service. The patient expressed understanding and agreed to proceed.  Interactive audio and video telecommunications were attempted between this provider and patient, however failed, due to patient having technical difficulties OR patient did not have access to video capability.  We continued and completed visit with audio only.   Reason for visit: Follow-up.  HPI: History of amputation: Patient has a distant history of amputation of part of her foot.  She notes at the amputation site her skin has started to callus and cracks some.  She notes no signs of infection such as erythema, drainage, or fevers.  No bleeding.  She tried Aquaphor on it.  She wonders what else she can try.  Anxiety/depression: Patient continues to have issues with both of these.  She notes the Lexapro has not been as much of the help recently.  She still is sad about her husband passing away.  She also notes there was somebody that presented themselves as an  Garment/textile technologist of [employer] and subsequently signed the deed house over to themselves from the patient's house.  She does see a therapist weekly notes has been quite beneficial.  No SI.  She also notes she is not been able to get in for ADHD testing.   ROS: See pertinent positives and negatives per HPI.  Past Medical History:  Diagnosis Date  . Allergic rhinitis   . Allergies   . Allergies 1980  . Anxiety   . Asthma   . Charcot-Marie-Tooth disease   . Chickenpox   . Depression   . Lisfranc dislocation   . Muscular dystrophy ([place])   . Osteomyelitis ([place]) 07/16/2013  . Osteomyelitis of foot ([place])   . Stress incontinence     Past Surgical History:  Procedure Laterality Date  . ECTOPIC PREGNANCY SURGERY  1995  . Great toe amputation Left April 2011  . Great toe amputation Right October 2015  . TUBAL LIGATION  2005    Family History  Problem Relation Age of Onset  . Hyperlipidemia Father   . Hypertension Father   . Sudden death Sister        Pneumonia  . Diabetes Paternal Grandmother   . Muscular dystrophy Other        Mother, grandparents, other relatives on maternal side  . Healthy Mother   . Breast cancer Neg Hx     SOCIAL HX: Smoker   Current Outpatient Medications:  .  albuterol (VENTOLIN HFA) 108 (90 Base) MCG/ACT inhaler, INHALE 2 PUFFS EVERY 6 HOURS AS NEEDED FOR WHEEZE OR SHORTNESS OF BREATH, Disp: 18 each, Rfl: 1 .  escitalopram (LEXAPRO) 20 MG tablet, Take 1 tablet (20 mg total) by mouth daily., Disp: 90 tablet, Rfl: 1 .  fluticasone (FLONASE) 50 MCG/ACT nasal spray, Place 2 sprays into both nostrils daily., Disp: 16 g, Rfl: 6 .  hydrOXYzine (ATARAX/VISTARIL) 25 MG tablet, Take 1 tablet (25 mg total) by mouth 3 (three) times daily as needed for anxiety., Disp: 30 tablet, Rfl: 0 .  loratadine (CLARITIN) 10 MG tablet, Take 1 tablet (10 mg total) by mouth daily., Disp: 30 tablet, Rfl: 5 .  mirabegron ER (MYRBETRIQ) 50 MG TB24 tablet, Take 1 tablet (50 mg total) by mouth  daily., Disp: 90 tablet, Rfl: 3 .  busPIRone (BUSPAR) 7.5 MG tablet, Take 1 tablet (7.5 mg total) by mouth 2 (two) times daily., Disp: 60 tablet, Rfl: 1  EXAM: This was a telephone visit and thus no physical exam was completed.  ASSESSMENT AND PLAN:  Discussed the following assessment and plan:  Problem List Items Addressed This Visit    Acquired deformities of toe    Patient with history of amputation.  She has callus forming with some skin cracking.  Discussed alternating Aquaphor with triple antibiotic ointment over-the-counter.  She can use a gauze pad over the site to be changed several times daily as well.  If she has any worsening she will let us know right away.  If it is not improving she will let us know as well so we can refer to podiatry.      Anxiety and depression    Chronic ongoing issue.  She will continue Lexapro 20 mg once daily.  We will add on BuSpar 7.5 mg twice daily.  She will continue with her therapist.  Follow-up in 6 weeks.      Attention deficit    Patient notes the wait list is too long for the psychologist with [facility] for ADHD testing.  We will try to refer her elsewhere.      Relevant Orders   Ambulatory referral to Psychology       I discussed the assessment and treatment plan with the patient. The patient was provided an opportunity to ask questions and all were answered. The patient agreed with the plan and demonstrated an understanding of the instructions.   The patient was advised to call back or seek an in-person evaluation if the symptoms worsen or if the condition fails to improve as anticipated.  I provided 13 minutes of non-face-to-face time during this encounter.   [name], MD

## 2020-01-01 ENCOUNTER — Ambulatory Visit (INDEPENDENT_AMBULATORY_CARE_PROVIDER_SITE_OTHER): Payer: Medicare Other | Admitting: Psychology

## 2020-01-01 ENCOUNTER — Telehealth: Payer: Self-pay | Admitting: Family Medicine

## 2020-01-01 ENCOUNTER — Telehealth: Payer: Self-pay

## 2020-01-01 DIAGNOSIS — F329 Major depressive disorder, single episode, unspecified: Secondary | ICD-10-CM | POA: Diagnosis not present

## 2020-01-01 MED ORDER — BUSPIRONE HCL 7.5 MG PO TABS: 7.5000 mg | ORAL_TABLET | Freq: Two times a day (BID) | ORAL | 1 refills | Status: DC

## 2020-01-01 NOTE — Telephone Encounter (Signed)
Pt called and states that PCP was going to send in buspirone but it has not been sent in. Please call pt to advise

## 2020-01-01 NOTE — Telephone Encounter (Signed)
lft msg with ofc to follow up on referral I will receive a call back once the referral coord comes back from lunch.

## 2020-01-01 NOTE — Telephone Encounter (Signed)
Pt called and states that PCP was going to send in buspirone but it has not been sent in. Please call pt to advise.  Lenzy Kerschner,cma

## 2020-01-01 NOTE — Telephone Encounter (Signed)
Sent to pharmacy. Please make sure she is no longer taking the remeron. If she is still taking it please see how often she is taking it as we will need to discontinue it and potentially taper off of it.

## 2020-01-02 NOTE — Telephone Encounter (Signed)
I called and spoke with the patient and informed her that her RX was sent to the pharmacy and patient stated she has not taken Remeron in a while.  Crit Obremski,cma

## 2020-01-02 NOTE — Telephone Encounter (Signed)
Spoke with patient about below and she states she has not taken remeron in a long time and does not plan on starting back

## 2020-01-02 NOTE — Telephone Encounter (Signed)
Noted. Please let her know that she should not restart the remeron at any point while on the lexapro or buspar.

## 2020-01-07 NOTE — Assessment & Plan Note (Signed)
Chronic ongoing issue.  She will continue Lexapro 20 mg once daily.  We will add on BuSpar 7.5 mg twice daily.  She will continue with her therapist.  Follow-up in 6 weeks.

## 2020-01-07 NOTE — Assessment & Plan Note (Signed)
Patient with history of amputation.  She has callus forming with some skin cracking.  Discussed alternating Aquaphor with triple antibiotic ointment over-the-counter.  She can use a gauze pad over the site to be changed several times daily as well.  If she has any worsening she will let us know right away.  If it is not improving she will let us know as well so we can refer to podiatry.

## 2020-01-07 NOTE — Assessment & Plan Note (Signed)
Patient notes the wait list is too long for the psychologist with [facility] for ADHD testing.  We will try to refer her elsewhere.

## 2020-01-14 ENCOUNTER — Telehealth: Payer: Self-pay | Admitting: Family Medicine

## 2020-01-14 NOTE — Telephone Encounter (Signed)
There was a referral requested on 10/29/2019 for ADHD at [facility] per [name]:  I received a return call from [facility] stated she spoke with [name] around 12/1 [name] was supposed to have sent in the NP paperwork. [name] will be reached back out.  Please advise and Thank you!

## 2020-01-14 NOTE — Telephone Encounter (Signed)
That is fine with me as long as they are going to reach back out to her. Thanks.

## 2020-01-15 ENCOUNTER — Ambulatory Visit: Payer: Medicare Other | Admitting: Psychology

## 2020-01-15 ENCOUNTER — Telehealth: Payer: Self-pay | Admitting: Family Medicine

## 2020-01-15 NOTE — Telephone Encounter (Signed)
lft vm for pt to call ofc regarding calling to sch appt at [facility].

## 2020-01-16 NOTE — Telephone Encounter (Signed)
Pt returned your call about scheduling appt at [facility]

## 2020-01-21 NOTE — Telephone Encounter (Signed)
Returned pt call and lft vm to call ofc.

## 2020-01-22 ENCOUNTER — Other Ambulatory Visit: Payer: Self-pay | Admitting: Family Medicine

## 2020-01-22 ENCOUNTER — Telehealth: Payer: Self-pay | Admitting: Family Medicine

## 2020-01-22 DIAGNOSIS — F419 Anxiety disorder, unspecified: Secondary | ICD-10-CM

## 2020-01-22 NOTE — Telephone Encounter (Signed)
Patient was returning call for referral 

## 2020-01-22 NOTE — Telephone Encounter (Signed)
Patient called in refill for escitalopram (LEXAPRO) 20 MG tablet

## 2020-01-28 ENCOUNTER — Other Ambulatory Visit: Payer: Self-pay

## 2020-01-28 ENCOUNTER — Encounter: Payer: Self-pay | Admitting: Urology

## 2020-01-28 ENCOUNTER — Ambulatory Visit (INDEPENDENT_AMBULATORY_CARE_PROVIDER_SITE_OTHER): Payer: Medicare Other | Admitting: Urology

## 2020-01-28 VITALS — BP 125/75 | HR 53

## 2020-01-28 DIAGNOSIS — N3946 Mixed incontinence: Secondary | ICD-10-CM

## 2020-01-28 MED ORDER — MIRABEGRON ER 50 MG PO TB24: 50.0000 mg | ORAL_TABLET | Freq: Every day | ORAL | 3 refills | Status: DC

## 2020-01-28 NOTE — Progress Notes (Signed)
01/28/2020 11:38 AM   [name] [birth year] [id number]  Referring provider: Leone Haven, MD [address] [place],  [place] [address]  No chief complaint on file.   HPI: I was consulted to assist the patient is urinary incontinence worsening over number years. She leaks with coughing sneezing and carrying objects. She does not necessarily leak with bending lifting. Sometimes she might have urge incontinence. She does have bedwetting moderate severity. She wears 4 pads a day moderately wet  She gets up at least 3 times a night. She is hourly frequency and cannot hold urination for 2 hours.  Grade 2 hypermobility the bladder neck and a mild positive cough test. Grade 1 cystocele or smaller  Patient has mixed incontinence. She has hourly frequency and bedwetting.  It appears that she has had some issues with chronic pain and medications. She has had some anxiety issues.   On urodynamics the patient did not void was catheterized for 275 mL. Maximum bladder capacity was 500 mL. Bladder was stable. Her cough leak point pressure 100 mL was 90 cmH2O with moderate leakage. At 300 mL her cough leak point pressure was 85 cmH2O with moderate severity. During voluntary voiding she voided 500 mL with a maximum flow 14 mils per second. She had to use abdominal straining to assist voiding. The contraction was not well sustained. Maximum voiding pressure is 8 cm of water.  Bladder neck descent at 1 or 2 cm. The details of the urodynamics are signed and dictated  Patient did say that she has no pelvic pain but has a lot of leg pain and has had some nondiabetic I believe toe amputations because of a type of muscular dystrophy she reports    Patient primarily has stress incontinence during the day with mild urge incontinence. She does have bedwetting. Importantly she also has hourly frequency.She has a history of chronic neuropathic pain. There was  no evidence of painful bladder during urodynamics. Reassess in 5 to 6 weeks on Myrbetriq samples and prescription. Eventually if we do not reach her treatment goal I will offer her surgery though an injectable might be something she would consider  Today Incontinence dramatically better.  Frequency persisting but improved.  Clinically not infected Mixed incontinence dramatically better and very happy.  No infection   PMH: Past Medical History:  Diagnosis Date  . Allergic rhinitis   . Allergies   . Allergies 1980  . Anxiety   . Asthma   . Charcot-Marie-Tooth disease   . Chickenpox   . Depression   . Lisfranc dislocation   . Muscular dystrophy ([place])   . Osteomyelitis ([place]) 07/16/2013  . Osteomyelitis of foot ([place])   . Stress incontinence     Surgical History: Past Surgical History:  Procedure Laterality Date  . ECTOPIC PREGNANCY SURGERY  1995  . Great toe amputation Left April 2011  . Great toe amputation Right October 2015  . TUBAL LIGATION  2005    Home Medications:  Allergies as of 01/28/2020      Reactions   Bee Venom Swelling   Vancomycin Shortness Of Breath   Penicillins    Other reaction(s): RASH   Sulfamethoxazole-trimethoprim Itching   Tramadol Nausea Only   Other reaction(s): NAUSEA   Clindamycin Nausea Only   And heartburn      Medication List       Accurate as of January 28, 2020 11:38 AM. If you have any questions, ask your nurse or doctor.  albuterol 108 (90 Base) MCG/ACT inhaler Commonly known as: VENTOLIN HFA INHALE 2 PUFFS EVERY 6 HOURS AS NEEDED FOR WHEEZE OR SHORTNESS OF BREATH   busPIRone 7.5 MG tablet Commonly known as: BUSPAR Take 1 tablet (7.5 mg total) by mouth 2 (two) times daily.   escitalopram 20 MG tablet Commonly known as: LEXAPRO Take 1 tablet (20 mg total) by mouth daily.   fluticasone 50 MCG/ACT nasal spray Commonly known as: FLONASE Place 2 sprays into both nostrils daily.   hydrOXYzine 25 MG tablet Commonly  known as: ATARAX/VISTARIL Take 1 tablet (25 mg total) by mouth 3 (three) times daily as needed for anxiety.   loratadine 10 MG tablet Commonly known as: CLARITIN Take 1 tablet (10 mg total) by mouth daily.   mirabegron ER 50 MG Tb24 tablet Commonly known as: MYRBETRIQ Take 1 tablet (50 mg total) by mouth daily.       Allergies:  Allergies  Allergen Reactions  . Bee Venom Swelling  . Vancomycin Shortness Of Breath  . Penicillins     Other reaction(s): RASH  . Sulfamethoxazole-Trimethoprim Itching  . Tramadol Nausea Only    Other reaction(s): NAUSEA  . Clindamycin Nausea Only    And heartburn    Family History: Family History  Problem Relation Age of Onset  . Hyperlipidemia Father   . Hypertension Father   . Sudden death Sister        Pneumonia  . Diabetes Paternal Grandmother   . Muscular dystrophy Other        Mother, grandparents, other relatives on maternal side  . Healthy Mother   . Breast cancer Neg Hx     Social History:  reports that she has been smoking cigarettes. She has never used smokeless tobacco. She reports current alcohol use. She reports previous drug use.  ROS:                                        Physical Exam: LMP 02/24/2017 (Approximate)   Constitutional:  Alert and oriented, No acute distress.   Laboratory Data: Lab Results  Component Value Date   WBC 6.4 02/15/2018   HGB 14.2 02/15/2018   HCT 42.6 02/15/2018   MCV 88.3 02/15/2018   PLT 321.0 02/15/2018    Lab Results  Component Value Date   CREATININE 0.48 02/15/2018    No results found for: PSA  No results found for: TESTOSTERONE  No results found for: HGBA1C  Urinalysis    Component Value Date/Time   APPEARANCEUR Clear 09/11/2018 0921   GLUCOSEU Negative 09/11/2018 0921   BILIRUBINUR Negative 09/11/2018 0921   PROTEINUR Negative 09/11/2018 0921   NITRITE Negative 09/11/2018 0921   LEUKOCYTESUR Negative 09/11/2018 1601    Pertinent  Imaging:   Assessment & Plan: 90x3 sent to pharmacy and I will see her in 1 year  There are no diagnoses linked to this encounter.  No follow-ups on file.  Reece Packer, MD  [facility] [address] [place], [place] [address] [phone]

## 2020-01-29 ENCOUNTER — Ambulatory Visit (INDEPENDENT_AMBULATORY_CARE_PROVIDER_SITE_OTHER): Payer: Medicare Other | Admitting: Psychology

## 2020-01-29 DIAGNOSIS — F329 Major depressive disorder, single episode, unspecified: Secondary | ICD-10-CM

## 2020-01-30 ENCOUNTER — Other Ambulatory Visit: Payer: Self-pay | Admitting: Family Medicine

## 2020-02-12 ENCOUNTER — Ambulatory Visit (INDEPENDENT_AMBULATORY_CARE_PROVIDER_SITE_OTHER): Payer: Medicare Other

## 2020-02-12 ENCOUNTER — Encounter: Payer: Self-pay | Admitting: Internal Medicine

## 2020-02-12 ENCOUNTER — Ambulatory Visit (INDEPENDENT_AMBULATORY_CARE_PROVIDER_SITE_OTHER): Payer: Medicare Other | Admitting: Internal Medicine

## 2020-02-12 ENCOUNTER — Other Ambulatory Visit: Payer: Self-pay

## 2020-02-12 ENCOUNTER — Ambulatory Visit: Admitting: Family Medicine

## 2020-02-12 VITALS — BP 102/70 | HR 89 | Temp 97.6°F | Ht 67.0 in | Wt 170.4 lb

## 2020-02-12 DIAGNOSIS — Z13818 Encounter for screening for other digestive system disorders: Secondary | ICD-10-CM | POA: Diagnosis not present

## 2020-02-12 DIAGNOSIS — E538 Deficiency of other specified B group vitamins: Secondary | ICD-10-CM

## 2020-02-12 DIAGNOSIS — Z114 Encounter for screening for human immunodeficiency virus [HIV]: Secondary | ICD-10-CM

## 2020-02-12 DIAGNOSIS — Z0000 Encounter for general adult medical examination without abnormal findings: Secondary | ICD-10-CM

## 2020-02-12 DIAGNOSIS — F419 Anxiety disorder, unspecified: Secondary | ICD-10-CM

## 2020-02-12 DIAGNOSIS — E559 Vitamin D deficiency, unspecified: Secondary | ICD-10-CM

## 2020-02-12 DIAGNOSIS — Z1329 Encounter for screening for other suspected endocrine disorder: Secondary | ICD-10-CM | POA: Diagnosis not present

## 2020-02-12 DIAGNOSIS — M545 Low back pain, unspecified: Secondary | ICD-10-CM

## 2020-02-12 DIAGNOSIS — Z113 Encounter for screening for infections with a predominantly sexual mode of transmission: Secondary | ICD-10-CM

## 2020-02-12 DIAGNOSIS — M25561 Pain in right knee: Secondary | ICD-10-CM | POA: Diagnosis not present

## 2020-02-12 DIAGNOSIS — Z1389 Encounter for screening for other disorder: Secondary | ICD-10-CM

## 2020-02-12 DIAGNOSIS — M25551 Pain in right hip: Secondary | ICD-10-CM | POA: Diagnosis not present

## 2020-02-12 DIAGNOSIS — M1611 Unilateral primary osteoarthritis, right hip: Secondary | ICD-10-CM | POA: Diagnosis not present

## 2020-02-12 DIAGNOSIS — Z13220 Encounter for screening for lipoid disorders: Secondary | ICD-10-CM | POA: Diagnosis not present

## 2020-02-12 DIAGNOSIS — E611 Iron deficiency: Secondary | ICD-10-CM | POA: Diagnosis not present

## 2020-02-12 DIAGNOSIS — Z23 Encounter for immunization: Secondary | ICD-10-CM

## 2020-02-12 DIAGNOSIS — M47812 Spondylosis without myelopathy or radiculopathy, cervical region: Secondary | ICD-10-CM | POA: Diagnosis not present

## 2020-02-12 DIAGNOSIS — M1612 Unilateral primary osteoarthritis, left hip: Secondary | ICD-10-CM | POA: Diagnosis not present

## 2020-02-12 DIAGNOSIS — M7989 Other specified soft tissue disorders: Secondary | ICD-10-CM | POA: Diagnosis not present

## 2020-02-12 MED ORDER — HYDROCODONE-ACETAMINOPHEN 5-325 MG PO TABS: 1.0000 | ORAL_TABLET | Freq: Two times a day (BID) | ORAL | 0 refills | Status: DC | PRN
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
## 2020-02-12 NOTE — Patient Instructions (Addendum)
Pick up pain medication  Referred [facility] ortho   Knee Exercises Ask your health care provider which exercises are safe for you. Do exercises exactly as told by your health care provider and adjust them as directed. It is normal to feel mild stretching, pulling, tightness, or discomfort as you do these exercises. Stop right away if you feel sudden pain or your pain gets worse. Do not begin these exercises until told by your health care provider. Stretching and range-of-motion exercises These exercises warm up your muscles and joints and improve the movement and flexibility of your knee. These exercises also help to relieve pain and swelling. Knee extension, prone 1. Lie on your abdomen (prone position) on a bed. 2. Place your left / right knee just beyond the edge of the surface so your knee is not on the bed. You can put a towel under your left / right thigh just above your kneecap for comfort. 3. Relax your leg muscles and allow gravity to straighten your knee (extension). You should feel a stretch behind your left / right knee. 4. Hold this position for __________ seconds. 5. Scoot up so your knee is supported between repetitions. Repeat __________ times. Complete this exercise __________ times a day. Knee flexion, active 1. Lie on your back with both legs straight. If this causes back discomfort, bend your left / right knee so your foot is flat on the floor. 2. Slowly slide your left / right heel back toward your buttocks. Stop when you feel a gentle stretch in the front of your knee or thigh (flexion). 3. Hold this position for __________ seconds. 4. Slowly slide your left / right heel back to the starting position. Repeat __________ times. Complete this exercise __________ times a day.   Quadriceps stretch, prone 1. Lie on your abdomen on a firm surface, such as a bed or padded floor. 2. Bend your left / right knee and hold your ankle. If you cannot reach your ankle or pant leg,  loop a belt around your foot and grab the belt instead. 3. Gently pull your heel toward your buttocks. Your knee should not slide out to the side. You should feel a stretch in the front of your thigh and knee (quadriceps). 4. Hold this position for __________ seconds. Repeat __________ times. Complete this exercise __________ times a day.   Hamstring, supine 1. Lie on your back (supine position). 2. Loop a belt or towel over the ball of your left / right foot. The ball of your foot is on the walking surface, right under your toes. 3. Straighten your left / right knee and slowly pull on the belt to raise your leg until you feel a gentle stretch behind your knee (hamstring). ? Do not let your knee bend while you do this. ? Keep your other leg flat on the floor. 4. Hold this position for __________ seconds. Repeat __________ times. Complete this exercise __________ times a day. Strengthening exercises These exercises build strength and endurance in your knee. Endurance is the ability to use your muscles for a long time, even after they get tired. Quadriceps, isometric This exercise stretches the muscles in front of your thigh (quadriceps) without moving your knee joint (isometric). 1. Lie on your back with your left / right leg extended and your other knee bent. Put a rolled towel or small pillow under your knee if told by your health care provider. 2. Slowly tense the muscles in the front of your left /  right thigh. You should see your kneecap slide up toward your hip or see increased dimpling just above the knee. This motion will push the back of the knee toward the floor. 3. For __________ seconds, hold the muscle as tight as you can without increasing your pain. 4. Relax the muscles slowly and completely. Repeat __________ times. Complete this exercise __________ times a day.   Straight leg raises This exercise stretches the muscles in front of your thigh (quadriceps) and the muscles that move  your hips (hip flexors). 1. Lie on your back with your left / right leg extended and your other knee bent. 2. Tense the muscles in the front of your left / right thigh. You should see your kneecap slide up or see increased dimpling just above the knee. Your thigh may even shake a bit. 3. Keep these muscles tight as you raise your leg 4-6 inches (10-15 cm) off the floor. Do not let your knee bend. 4. Hold this position for __________ seconds. 5. Keep these muscles tense as you lower your leg. 6. Relax your muscles slowly and completely after each repetition. Repeat __________ times. Complete this exercise __________ times a day. Hamstring, isometric 1. Lie on your back on a firm surface. 2. Bend your left / right knee about __________ degrees. 3. Dig your left / right heel into the surface as if you are trying to pull it toward your buttocks. Tighten the muscles in the back of your thighs (hamstring) to "dig" as hard as you can without increasing any pain. 4. Hold this position for __________ seconds. 5. Release the tension gradually and allow your muscles to relax completely for __________ seconds after each repetition. Repeat __________ times. Complete this exercise __________ times a day. Hamstring curls If told by your health care provider, do this exercise while wearing ankle weights. Begin with __________ lb weights. Then increase the weight by 1 lb (0.5 kg) increments. Do not wear ankle weights that are more than __________ lb. 1. Lie on your abdomen with your legs straight. 2. Bend your left / right knee as far as you can without feeling pain. Keep your hips flat against the floor. 3. Hold this position for __________ seconds. 4. Slowly lower your leg to the starting position. Repeat __________ times. Complete this exercise __________ times a day.   Squats This exercise strengthens the muscles in front of your thigh and knee (quadriceps). 1. Stand in front of a table, with your feet  and knees pointing straight ahead. You may rest your hands on the table for balance but not for support. 2. Slowly bend your knees and lower your hips like you are going to sit in a chair. ? Keep your weight over your heels, not over your toes. ? Keep your lower legs upright so they are parallel with the table legs. ? Do not let your hips go lower than your knees. ? Do not bend lower than told by your health care provider. ? If your knee pain increases, do not bend as low. 3. Hold the squat position for __________ seconds. 4. Slowly push with your legs to return to standing. Do not use your hands to pull yourself to standing. Repeat __________ times. Complete this exercise __________ times a day. Wall slides This exercise strengthens the muscles in front of your thigh and knee (quadriceps). 1. Lean your back against a smooth wall or door, and walk your feet out 18-24 inches (46-61 cm) from it. 2. Place your  feet hip-width apart. 3. Slowly slide down the wall or door until your knees bend __________ degrees. Keep your knees over your heels, not over your toes. Keep your knees in line with your hips. 4. Hold this position for __________ seconds. Repeat __________ times. Complete this exercise __________ times a day.   Straight leg raises This exercise strengthens the muscles that rotate the leg at the hip and move it away from your body (hip abductors). 1. Lie on your side with your left / right leg in the top position. Lie so your head, shoulder, knee, and hip line up. You may bend your bottom knee to help you keep your balance. 2. Roll your hips slightly forward so your hips are stacked directly over each other and your left / right knee is facing forward. 3. Leading with your heel, lift your top leg 4-6 inches (10-15 cm). You should feel the muscles in your outer hip lifting. ? Do not let your foot drift forward. ? Do not let your knee roll toward the ceiling. 4. Hold this position for  __________ seconds. 5. Slowly return your leg to the starting position. 6. Let your muscles relax completely after each repetition. Repeat __________ times. Complete this exercise __________ times a day.   Straight leg raises This exercise stretches the muscles that move your hips away from the front of the pelvis (hip extensors). 1. Lie on your abdomen on a firm surface. You can put a pillow under your hips if that is more comfortable. 2. Tense the muscles in your buttocks and lift your left / right leg about 4-6 inches (10-15 cm). Keep your knee straight as you lift your leg. 3. Hold this position for __________ seconds. 4. Slowly lower your leg to the starting position. 5. Let your leg relax completely after each repetition. Repeat __________ times. Complete this exercise __________ times a day. This information is not intended to replace advice given to you by your health care provider. Make sure you discuss any questions you have with your health care provider. Document Revised: 10/25/2017 Document Reviewed: 10/25/2017 Elsevier Patient Education  2021 [employer].
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
## 2020-02-13 ENCOUNTER — Encounter: Payer: Self-pay | Admitting: Internal Medicine

## 2020-02-13 ENCOUNTER — Ambulatory Visit (INDEPENDENT_AMBULATORY_CARE_PROVIDER_SITE_OTHER): Payer: Medicare Other | Admitting: Psychology

## 2020-02-13 DIAGNOSIS — F329 Major depressive disorder, single episode, unspecified: Secondary | ICD-10-CM | POA: Diagnosis not present

## 2020-02-14 ENCOUNTER — Ambulatory Visit: Admitting: Family Medicine

## 2020-02-19 ENCOUNTER — Telehealth: Payer: Self-pay
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
## 2020-02-20 ENCOUNTER — Telehealth: Payer: Self-pay

## 2020-02-20 ENCOUNTER — Ambulatory Visit: Payer: Medicare Other

## 2020-02-20 NOTE — Telephone Encounter (Signed)
Left message for patient to return call back.  

## 2020-02-20 NOTE — Telephone Encounter (Signed)
She will need to be seen for this. If she can't wait until Thursday she can go to urgent care.

## 2020-02-20 NOTE — Telephone Encounter (Signed)
Unable to successfully reach patient for scheduled awv. No answer. Left message to call office and reschedule.

## 2020-02-20 NOTE — Telephone Encounter (Signed)
Called and LVM for the patien to call back to discuss option.  [name],cma

## 2020-02-20 NOTE — Telephone Encounter (Signed)
Pt rescheduled to 02/22/20 @ 1pm.

## 2020-02-20 NOTE — Telephone Encounter (Signed)
Pt said she will keep her appt with Dr. Olivia Mackie on tomorrow. I went ahead and let her know what Dr. Caryl Bis said. She said if you need anything else you can call her back.

## 2020-02-21 ENCOUNTER — Encounter: Payer: Self-pay | Admitting: Internal Medicine

## 2020-02-21 ENCOUNTER — Ambulatory Visit: Payer: Medicare Other | Admitting: Psychology

## 2020-02-21 ENCOUNTER — Ambulatory Visit (INDEPENDENT_AMBULATORY_CARE_PROVIDER_SITE_OTHER): Payer: Medicare Other | Admitting: Internal Medicine

## 2020-02-21 VITALS — Temp 100.5°F | Ht 67.0 in | Wt 156.0 lb

## 2020-02-21 DIAGNOSIS — R058 Other specified cough: Secondary | ICD-10-CM | POA: Diagnosis not present

## 2020-02-21 DIAGNOSIS — R5383 Other fatigue: Secondary | ICD-10-CM

## 2020-02-21 DIAGNOSIS — R0789 Other chest pain: Secondary | ICD-10-CM

## 2020-02-21 DIAGNOSIS — J441 Chronic obstructive pulmonary disease with (acute) exacerbation: Secondary | ICD-10-CM

## 2020-02-21 DIAGNOSIS — R0781 Pleurodynia: Secondary | ICD-10-CM

## 2020-02-21 DIAGNOSIS — R079 Chest pain, unspecified: Secondary | ICD-10-CM | POA: Diagnosis not present

## 2020-02-21 DIAGNOSIS — U071 COVID-19: Secondary | ICD-10-CM

## 2020-02-21 DIAGNOSIS — R509 Fever, unspecified: Secondary | ICD-10-CM | POA: Diagnosis not present

## 2020-02-21 MED ORDER — AZITHROMYCIN 250 MG PO TABS: ORAL_TABLET | ORAL | 0 refills | Status: DC

## 2020-02-21 MED ORDER — DICLOFENAC SODIUM 1 % EX GEL: 2.0000 g | Freq: Four times a day (QID) | CUTANEOUS | 2 refills | Status: DC
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
## 2020-02-21 NOTE — Patient Instructions (Signed)
Discuseed otc=over the counter voltaren gel or lidocaine/salonpas pain patches Use a pillow with coughing Ice/heat to soothe Please get Xrays of your chest and sternum  Costochondritis  Costochondritis is inflammation of the tissue (cartilage) that connects the ribs to the breastbone (sternum). This causes pain in the front of the chest. The pain usually starts slowly and involves more than one rib. What are the causes? The exact cause of this condition is not always known. It results from stress on the cartilage where your ribs attach to your sternum. The cause of this stress could be:  Chest injury.  Exercise or activity, such as lifting.  Severe coughing. What increases the risk? You are more likely to develop this condition if you:  Are female.  Are 15-68 years old.  Recently started a new exercise or work activity.  Have low levels of vitamin D.  Have a condition that makes you cough frequently. What are the signs or symptoms? The main symptom of this condition is chest pain. The pain:  Usually starts gradually and can be sharp or dull.  Gets worse with deep breathing, coughing, or exercise.  Gets better with rest.  May be worse when you press on the affected area of your ribs and sternum. How is this diagnosed? This condition is diagnosed based on your symptoms, your medical history, and a physical exam. Your health care provider will check for pain when pressing on your sternum. You may also have tests to rule out other causes of chest pain. These may include:  A chest X-ray to check for lung problems.  An ECG (electrocardiogram) to see if you have a heart problem that could be causing the pain.  An imaging scan to rule out a chest or rib fracture. How is this treated? This condition usually goes away on its own over time. Your health care provider may prescribe an NSAID, such as ibuprofen, to reduce pain and inflammation. Treatment may also include:  Resting  and avoiding activities that make pain worse.  Applying heat or ice to the area to reduce pain and inflammation.  Doing exercises to stretch your chest muscles. If these treatments do not help, your health care provider may inject a numbing medicine at the sternum-rib connection to help relieve the pain. Follow these instructions at home: Managing pain, stiffness, and swelling  If directed, put ice on the painful area. To do this: ? Put ice in a plastic bag. ? Place a towel between your skin and the bag. ? Leave the ice on for 20 minutes, 2-3 times a day.  If directed, apply heat to the affected area as often as told by your health care provider. Use the heat source that your health care provider recommends, such as a moist heat pack or a heating pad. ? Place a towel between your skin and the heat source. ? Leave the heat on for 20-30 minutes. ? Remove the heat if your skin turns bright red. This is especially important if you are unable to feel pain, heat, or cold. You may have a greater risk of getting burned.      Activity  Rest as told by your health care provider. Avoid activities that make pain worse. This includes any activities that use chest, abdominal, and side muscles.  Do not lift anything that is heavier than 10 lb (4.5 kg), or the limit that you are told, until your health care provider says that it is safe.  Return to your normal  activities as told by your health care provider. Ask your health care provider what activities are safe for you. General instructions  Take over-the-counter and prescription medicines only as told by your health care provider.  Keep all follow-up visits as told by your health care provider. This is important. Contact a health care provider if:  You have chills or a fever.  Your pain does not go away or it gets worse.  You have a cough that does not go away. Get help right away if:  You have shortness of breath.  You have severe chest  pain that is not relieved by medicines, heat, or ice. These symptoms may represent a serious problem that is an emergency. Do not wait to see if the symptoms will go away. Get medical help right away. Call your local emergency services (911 in the [place] Do not drive yourself to the hospital.  Summary  Costochondritis is inflammation of the tissue (cartilage) that connects the ribs to the breastbone (sternum).  This condition causes pain in the front of the chest.  Costochondritis results from stress on the cartilage where your ribs attach to your sternum.  Treatment may include medicines, rest, heat or ice, and exercises. This information is not intended to replace advice given to you by your health care provider. Make sure you discuss any questions you have with your health care provider. Document Revised: 11/17/2018 Document Reviewed: 11/17/2018 Elsevier Patient Education  2021 [employer].

## 2020-02-22 ENCOUNTER — Encounter: Payer: Self-pay | Admitting: Internal Medicine

## 2020-02-22 ENCOUNTER — Telehealth: Payer: Self-pay

## 2020-02-22 ENCOUNTER — Ambulatory Visit: Payer: Medicare Other

## 2020-02-22 ENCOUNTER — Telehealth: Payer: Self-pay | Admitting: Family Medicine

## 2020-02-22 DIAGNOSIS — U071 COVID-19: Secondary | ICD-10-CM | POA: Insufficient documentation

## 2020-02-22 MED ORDER — PREDNISONE 20 MG PO TABS: 40.0000 mg | ORAL_TABLET | Freq: Every day | ORAL | 0 refills | Status: DC

## 2020-02-22 MED ORDER — ALBUTEROL SULFATE HFA 108 (90 BASE) MCG/ACT IN AERS: 1.0000 | INHALATION_SPRAY | Freq: Four times a day (QID) | RESPIRATORY_TRACT | 11 refills | Status: DC | PRN

## 2020-02-22 NOTE — Addendum Note (Signed)
Addended by: Orland Mustard on: 02/22/2020 04:16 PM   Modules accepted: Orders

## 2020-02-22 NOTE — Addendum Note (Signed)
Addended by: Orland Mustard on: 02/22/2020 03:48 PM   Modules accepted: Orders

## 2020-02-22 NOTE — Telephone Encounter (Signed)
Patient called in stated that she did the home test and it was positive now she wants to come here and be tested ?

## 2020-02-22 NOTE — Telephone Encounter (Signed)
Please advise   Okay to be tested?

## 2020-02-22 NOTE — Telephone Encounter (Signed)
Wants to get tested covid  If + does not need to go anywhere for 10-14 days   Review with pt  There is no medication other than over the counter meds:  Mucinex dm green label for cough.  Vitamin C 1000 mg daily.  Vitamin D3 4000 Iu (units) daily.  Zinc 100 mg daily.  Quercetin 250-500 mg 2 times per day   Elderberry  Oil of oregano  cepacol or chloroseptic spray  Warm tea with honey and lemon  Hydration  Try to eat though you dont feel like it   Tylenol or Advil  Nasal saline  Flonase   over the counter allergy pill cough, runny nose, sneezing Monitor pulse oximeter, buy from [place] if oxygen is less than 90 please go to the hospital.        Are you feeling really sick? Shortness of breath, cough, chest pain?, dizziness? Confusion   If so let me know  If worsening, go to hospital or [facility] Urgent care for further treatment

## 2020-02-22 NOTE — Telephone Encounter (Signed)
Unable to reach patient for scheduled awv. No answer. Left message to reschedule. 

## 2020-02-22 NOTE — Telephone Encounter (Signed)
Per Dr. Gayland Curry, pt declined COVID testing here today. Will quarantine & treat symptoms as directed.

## 2020-02-22 NOTE — Addendum Note (Signed)
Addended by: Orland Mustard on: 02/22/2020 04:20 PM   Modules accepted: Orders

## 2020-02-25 NOTE — Telephone Encounter (Signed)
Noted  

## 2020-02-27 NOTE — Telephone Encounter (Signed)
I called the patient and informed her that the provider stated there was no alternative to prednisone and to stop if she cannot tolerate it and take her other medicaitons and she understood.  Nina,cma

## 2020-02-27 NOTE — Telephone Encounter (Signed)
Pt said Dr. Olivia Mackie prescribe her prednisone and she is really irritable. She wants to know if there is an alternative medication she can get instead of the prednisone?

## 2020-02-27 NOTE — Telephone Encounter (Signed)
No alternative if she cant tolerate or too irritable please stop and continue other rec. Medications

## 2020-02-27 NOTE — Telephone Encounter (Signed)
Please advise 

## 2020-02-28 ENCOUNTER — Ambulatory Visit (INDEPENDENT_AMBULATORY_CARE_PROVIDER_SITE_OTHER): Payer: Medicare Other | Admitting: Psychology

## 2020-02-28 DIAGNOSIS — F329 Major depressive disorder, single episode, unspecified: Secondary | ICD-10-CM

## 2020-03-05 ENCOUNTER — Ambulatory Visit: Admitting: Family Medicine

## 2020-03-13 ENCOUNTER — Other Ambulatory Visit: Payer: Self-pay | Admitting: Family Medicine

## 2020-03-13 DIAGNOSIS — U071 COVID-19: Secondary | ICD-10-CM

## 2020-03-13 DIAGNOSIS — J441 Chronic obstructive pulmonary disease with (acute) exacerbation: Secondary | ICD-10-CM

## 2020-03-13 DIAGNOSIS — R058 Other specified cough: Secondary | ICD-10-CM

## 2020-03-25 ENCOUNTER — Ambulatory Visit: Payer: Medicare Other

## 2020-03-25 ENCOUNTER — Telehealth: Payer: Self-pay

## 2020-03-25 NOTE — Telephone Encounter (Signed)
Unable to reach patient for scheduled awv. No answer. Left message to call the office and reschedule.

## 2020-03-26 ENCOUNTER — Ambulatory Visit (INDEPENDENT_AMBULATORY_CARE_PROVIDER_SITE_OTHER): Payer: Medicare Other | Admitting: Psychology

## 2020-03-26 DIAGNOSIS — F329 Major depressive disorder, single episode, unspecified: Secondary | ICD-10-CM | POA: Diagnosis not present

## 2020-04-01 ENCOUNTER — Telehealth: Payer: Self-pay | Admitting: Family Medicine

## 2020-04-01 NOTE — Telephone Encounter (Signed)
Patient would like a new referral for physical therapy. She could not go in January because she got COVID really bad.  Nina,cma

## 2020-04-01 NOTE — Telephone Encounter (Signed)
Patient would like a new referral for physical therapy. She could not go in January because she got COVID really bad.

## 2020-04-02 NOTE — Telephone Encounter (Signed)
What is this referral for? I see a orthopedic referral in there from January but no PT referral.

## 2020-04-02 NOTE — Telephone Encounter (Signed)
I called and LVM for the patient to call back for questions about PT.  Jceon Alverio,cma

## 2020-04-03 NOTE — Telephone Encounter (Signed)
I called and LVM for the patient to call back to ask about PT referral.  Nina,cma

## 2020-04-04 ENCOUNTER — Ambulatory Visit (INDEPENDENT_AMBULATORY_CARE_PROVIDER_SITE_OTHER): Payer: Medicare Other | Admitting: Psychology

## 2020-04-04 DIAGNOSIS — F329 Major depressive disorder, single episode, unspecified: Secondary | ICD-10-CM | POA: Diagnosis not present

## 2020-04-07 NOTE — Telephone Encounter (Signed)
I called and spoke with the patient and she no longer needs a referral she has one that expires in 1 year.  Nina,cma

## 2020-04-14 ENCOUNTER — Ambulatory Visit (INDEPENDENT_AMBULATORY_CARE_PROVIDER_SITE_OTHER): Payer: Medicare Other | Admitting: Psychology

## 2020-04-14 DIAGNOSIS — F339 Major depressive disorder, recurrent, unspecified: Secondary | ICD-10-CM | POA: Diagnosis not present

## 2020-04-16 DIAGNOSIS — M25551 Pain in right hip: Secondary | ICD-10-CM | POA: Diagnosis not present

## 2020-04-16 DIAGNOSIS — G619 Inflammatory polyneuropathy, unspecified: Secondary | ICD-10-CM | POA: Diagnosis not present

## 2020-04-16 DIAGNOSIS — M25561 Pain in right knee: Secondary | ICD-10-CM | POA: Diagnosis not present

## 2020-04-16 DIAGNOSIS — M5489 Other dorsalgia: Secondary | ICD-10-CM | POA: Diagnosis not present

## 2020-04-16 DIAGNOSIS — G622 Polyneuropathy due to other toxic agents: Secondary | ICD-10-CM | POA: Diagnosis not present

## 2020-05-16 ENCOUNTER — Other Ambulatory Visit: Payer: Self-pay | Admitting: Family Medicine

## 2020-05-16 DIAGNOSIS — F419 Anxiety disorder, unspecified: Secondary | ICD-10-CM

## 2020-05-16 DIAGNOSIS — F32A Depression, unspecified: Secondary | ICD-10-CM

## 2020-05-23 ENCOUNTER — Ambulatory Visit (INDEPENDENT_AMBULATORY_CARE_PROVIDER_SITE_OTHER): Admitting: Psychology

## 2020-05-23 DIAGNOSIS — F331 Major depressive disorder, recurrent, moderate: Secondary | ICD-10-CM | POA: Diagnosis not present

## 2020-06-12 ENCOUNTER — Encounter: Payer: Self-pay | Admitting: Family Medicine

## 2020-06-12 ENCOUNTER — Ambulatory Visit (INDEPENDENT_AMBULATORY_CARE_PROVIDER_SITE_OTHER): Payer: Medicare PPO | Admitting: Family Medicine

## 2020-06-12 ENCOUNTER — Other Ambulatory Visit: Payer: Self-pay

## 2020-06-12 DIAGNOSIS — M2061 Acquired deformities of toe(s), unspecified, right foot: Secondary | ICD-10-CM

## 2020-06-12 DIAGNOSIS — F419 Anxiety disorder, unspecified: Secondary | ICD-10-CM

## 2020-06-12 DIAGNOSIS — F32A Depression, unspecified: Secondary | ICD-10-CM | POA: Diagnosis not present

## 2020-06-12 MED ORDER — BUSPIRONE HCL 7.5 MG PO TABS: 7.5000 mg | ORAL_TABLET | Freq: Three times a day (TID) | ORAL | 0 refills | Status: DC
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
## 2020-06-12 NOTE — Patient Instructions (Signed)
Nice to see you. We have referred you to podiatry.  If you do not hear from them within a week please let us know. Please try to keep the area covered while you are wearing shoes.  Please try to limit weightbearing on that foot.  If you develop spreading redness, drainage of pus, increasing pain, or fevers please seek medical attention immediately. I have increased the frequency of your BuSpar.  We will see if this helps with your anxiety and depression.

## 2020-06-12 NOTE — Assessment & Plan Note (Signed)
Chronic ongoing issue that seems to have worsened.  She will continue Lexapro 20 mg once daily.  We will increase her BuSpar dose.  She will continue to see her therapist.  Follow-up in 6 weeks.

## 2020-06-12 NOTE — Progress Notes (Signed)
Pre visit review using our clinic review tool, if applicable. No additional management support is needed unless otherwise documented below in the visit note. 

## 2020-06-12 NOTE — Assessment & Plan Note (Addendum)
Chronic issue.  The patient has developed worsening callus formation and has a fairly significant crack in the callus.  There is no sign of infection.  Will refer to podiatry.  Advised to seek medical attention immediately for fevers, purulent drainage, spreading erythema, or pain.

## 2020-06-20 ENCOUNTER — Ambulatory Visit (INDEPENDENT_AMBULATORY_CARE_PROVIDER_SITE_OTHER): Payer: Medicare PPO | Admitting: Psychology

## 2020-06-20 ENCOUNTER — Other Ambulatory Visit: Payer: Self-pay | Admitting: Internal Medicine

## 2020-06-20 DIAGNOSIS — F329 Major depressive disorder, single episode, unspecified: Secondary | ICD-10-CM | POA: Diagnosis not present

## 2020-06-24 ENCOUNTER — Ambulatory Visit: Payer: Medicare PPO | Admitting: Psychology

## 2020-06-24 ENCOUNTER — Telehealth: Payer: Self-pay | Admitting: Family Medicine

## 2020-06-24 NOTE — Telephone Encounter (Signed)
PT called in to request a refill of their MYRBETRIQ 50 MG TB24 tablet. PT states it needs Prior Auth and she is currently out of her meds.

## 2020-06-25 ENCOUNTER — Other Ambulatory Visit: Payer: Self-pay

## 2020-06-25 MED ORDER — MIRABEGRON ER 50 MG PO TB24: 50.0000 mg | ORAL_TABLET | Freq: Every day | ORAL | 3 refills | Status: DC

## 2020-06-25 NOTE — Telephone Encounter (Signed)
Patient called asking for refill on myrbetriq and states she needs PA. Medication sent to pharmacy. Asked pharmacy to initiate prior authorization. Patient aware.

## 2020-06-25 NOTE — Telephone Encounter (Signed)
I called the patient after I called the pharmacy and they stated her copay would be $350 for the myrbetriq.  When I spoke with the patient she stated that the Urologist is who gave her this mediation and she did not know why we sent int he prescription because she never had to pay this much before.  I informed the patient to call Urology and let them refill it and see if she can get the medication.  She stated she has been out for a while and is urinating on herself. She understood and will call them.   [name],cma

## 2020-07-03 ENCOUNTER — Ambulatory Visit (INDEPENDENT_AMBULATORY_CARE_PROVIDER_SITE_OTHER): Payer: Medicare PPO | Admitting: Psychology

## 2020-07-03 DIAGNOSIS — F329 Major depressive disorder, single episode, unspecified: Secondary | ICD-10-CM

## 2020-07-07 ENCOUNTER — Telehealth: Payer: Self-pay

## 2020-07-07 NOTE — Telephone Encounter (Signed)
Pt calls triage line and states that she is awaiting a PA on Myrbetriq. Pt states that she has been waiting about 2 weeks, and is currently out of the medication and having severe leakage. She requests to be updated when PA is complete.

## 2020-07-07 NOTE — Telephone Encounter (Signed)
PA pending. Key [id number]

## 2020-07-09 NOTE — Telephone Encounter (Signed)
Prior Authorization was denied for Myrbetriq, is there something else we can call in?

## 2020-07-17 ENCOUNTER — Ambulatory Visit (INDEPENDENT_AMBULATORY_CARE_PROVIDER_SITE_OTHER): Payer: Medicare PPO | Admitting: Psychology

## 2020-07-17 DIAGNOSIS — F329 Major depressive disorder, single episode, unspecified: Secondary | ICD-10-CM

## 2020-08-16 ENCOUNTER — Other Ambulatory Visit: Payer: Self-pay | Admitting: Family Medicine

## 2020-08-16 DIAGNOSIS — F419 Anxiety disorder, unspecified: Secondary | ICD-10-CM

## 2020-08-16 DIAGNOSIS — F32A Depression, unspecified: Secondary | ICD-10-CM

## 2020-08-18 NOTE — Telephone Encounter (Signed)
This was increased at her recent visit. Is there a reason they are requesting the twice daily dosing? Also please find out if she was able to see podiatry for her foot.

## 2020-08-19 NOTE — Telephone Encounter (Signed)
Left a message to call back.

## 2020-08-21 NOTE — Telephone Encounter (Signed)
Left message to call back  

## 2020-08-25 NOTE — Telephone Encounter (Signed)
Sent patient a Pharmacist, community message requesting her to contact the office to discuss medication request and podiatry appointment.

## 2020-09-01 ENCOUNTER — Ambulatory Visit (INDEPENDENT_AMBULATORY_CARE_PROVIDER_SITE_OTHER): Payer: Medicare PPO | Admitting: Psychology

## 2020-09-01 DIAGNOSIS — F329 Major depressive disorder, single episode, unspecified: Secondary | ICD-10-CM | POA: Diagnosis not present

## 2020-09-15 ENCOUNTER — Ambulatory Visit (INDEPENDENT_AMBULATORY_CARE_PROVIDER_SITE_OTHER): Payer: Medicare PPO | Admitting: Psychology

## 2020-09-15 DIAGNOSIS — F3289 Other specified depressive episodes: Secondary | ICD-10-CM

## 2020-09-24 ENCOUNTER — Ambulatory Visit: Payer: Medicare PPO | Admitting: Psychology

## 2020-10-03 ENCOUNTER — Telehealth: Payer: Self-pay

## 2020-10-03 ENCOUNTER — Other Ambulatory Visit: Payer: Self-pay

## 2020-10-06 NOTE — Telephone Encounter (Signed)
Yes to all .  Mando Blatz,cma

## 2020-10-06 NOTE — Telephone Encounter (Signed)
Did you advise them that she has not followed up like she was previously advised?  She was seen on 06/12/2020 and was advised to follow-up at 6 weeks though it does not appear that she ever followed up.  She was also referred to [facility] podiatry at that time and I am unable to tell if she actually ended up seeing them.  Were you able to look up her refill history through her pharmacy?

## 2020-10-06 NOTE — Telephone Encounter (Signed)
Thanks.  Just wanted to make sure.

## 2020-11-14 ENCOUNTER — Telehealth: Payer: Self-pay | Admitting: Urology

## 2020-11-14 NOTE — Telephone Encounter (Signed)
Pt was prescribed MYRBETRIQ, but it is now $300 and she cannot afford it. [employer] told her to call to see if her prescription could be switched to OXYBUTYNIN Chloride tablets. Patient would like a call back, as she is out of her medication.

## 2020-11-17 MED ORDER — OXYBUTYNIN CHLORIDE ER 10 MG PO TB24: 10.0000 mg | ORAL_TABLET | Freq: Every day | ORAL | 11 refills | Status: DC

## 2020-11-17 NOTE — Telephone Encounter (Signed)
Left message for pt. Sent in oxybutynin

## 2020-11-17 NOTE — Addendum Note (Signed)
Addended by: Alvera Novel on: 11/17/2020 04:39 PM   Modules accepted: Orders

## 2020-11-24 ENCOUNTER — Ambulatory Visit (INDEPENDENT_AMBULATORY_CARE_PROVIDER_SITE_OTHER): Payer: Medicare PPO | Admitting: Psychology

## 2020-11-24 DIAGNOSIS — F329 Major depressive disorder, single episode, unspecified: Secondary | ICD-10-CM

## 2020-12-08 ENCOUNTER — Ambulatory Visit: Payer: Medicare PPO | Admitting: Psychology

## 2020-12-14 ENCOUNTER — Other Ambulatory Visit: Payer: Self-pay | Admitting: Family Medicine

## 2020-12-14 DIAGNOSIS — F419 Anxiety disorder, unspecified: Secondary | ICD-10-CM

## 2020-12-16 ENCOUNTER — Ambulatory Visit (INDEPENDENT_AMBULATORY_CARE_PROVIDER_SITE_OTHER): Payer: Medicare PPO | Admitting: Psychology

## 2020-12-16 DIAGNOSIS — F329 Major depressive disorder, single episode, unspecified: Secondary | ICD-10-CM

## 2020-12-22 ENCOUNTER — Other Ambulatory Visit: Payer: Self-pay

## 2020-12-22 ENCOUNTER — Ambulatory Visit (INDEPENDENT_AMBULATORY_CARE_PROVIDER_SITE_OTHER): Payer: Medicare PPO

## 2020-12-22 ENCOUNTER — Ambulatory Visit (INDEPENDENT_AMBULATORY_CARE_PROVIDER_SITE_OTHER): Payer: Medicare PPO | Admitting: Adult Health

## 2020-12-22 ENCOUNTER — Encounter: Payer: Self-pay | Admitting: Adult Health

## 2020-12-22 VITALS — BP 116/78 | HR 90 | Temp 97.3°F | Ht 67.0 in | Wt 158.6 lb

## 2020-12-22 DIAGNOSIS — L03818 Cellulitis of other sites: Secondary | ICD-10-CM

## 2020-12-22 DIAGNOSIS — S99921A Unspecified injury of right foot, initial encounter: Secondary | ICD-10-CM | POA: Diagnosis not present

## 2020-12-22 DIAGNOSIS — Z23 Encounter for immunization: Secondary | ICD-10-CM | POA: Diagnosis not present

## 2020-12-22 DIAGNOSIS — L97519 Non-pressure chronic ulcer of other part of right foot with unspecified severity: Secondary | ICD-10-CM | POA: Diagnosis not present

## 2020-12-22 DIAGNOSIS — M2061 Acquired deformities of toe(s), unspecified, right foot: Secondary | ICD-10-CM

## 2020-12-22 DIAGNOSIS — L0390 Cellulitis, unspecified: Secondary | ICD-10-CM | POA: Insufficient documentation

## 2020-12-22 MED ORDER — DOXYCYCLINE HYCLATE 100 MG PO TABS: 100.0000 mg | ORAL_TABLET | Freq: Two times a day (BID) | ORAL | 0 refills | Status: DC

## 2020-12-22 NOTE — Patient Instructions (Addendum)
Doxycycline Capsules or Tablets What is this medication? DOXYCYCLINE (dox i SYE kleen) treats infections caused by bacteria. It belongs to a group of medications called tetracycline antibiotics. It will not treat colds, the flu, or infections caused by viruses. This medicine may be used for other purposes; ask your health care provider or pharmacist if you have questions. COMMON BRAND NAME(S): Acticlate, Adoxa, Adoxa CK, Adoxa Pak, Adoxa TT, Alodox, Avidoxy, Doxal, LYMEPAK, Mondoxyne NL, Monodox, Morgidox 1x, Morgidox 1x Kit, Morgidox 2x, Morgidox 2x Kit, NutriDox, Ocudox, [place], [place], [place], Vibra-Tabs, Vibramycin What should I tell my care team before I take this medication? They need to know if you have any of these conditions: Kidney disease Liver disease Long exposure to sunlight like working outdoors Recent stomach surgery Stomach or intestine problems such as colitis Vision Problems Yeast or fungal infection of the mouth or vagina An unusual or allergic reaction to doxycycline, tetracycline antibiotics, other medications, foods, dyes, or preservatives Pregnant or trying to get pregnant Breast-feeding How should I use this medication? Take this medication by mouth with water. Take it as directed on the prescription label at the same time every day. It is best to take this medication without food, but if it upsets your stomach take it with food. Take all of this medication unless your care team tells you to stop it early. Keep taking it even if you think you are better. Take antacids and products with aluminum, calcium, magnesium, iron, and zinc in them at a different time of day than this medication. Talk to your care team if you have questions. Talk to your care team regarding the use of this medication in children. While this medication may be prescribed for selected conditions, precautions do apply. Overdosage: If you think you have taken too much of this medicine contact  a poison control center or emergency room at once. NOTE: This medicine is only for you. Do not share this medicine with others. What if I miss a dose? If you miss a dose, take it as soon as you can. If it is almost time for your next dose, take only that dose. Do not take double or extra doses. What may interact with this medication? Antacids, vitamins, or other products that contain aluminum, calcium, iron, magnesium, or zinc Barbiturates Birth control pills Bismuth subsalicylate Carbamazepine Methoxyflurane Oral retinoids such as acitretin, isotretinoin Other antibiotics Phenytoin Warfarin This list may not describe all possible interactions. Give your health care provider a list of all the medicines, herbs, non-prescription drugs, or dietary supplements you use. Also tell them if you smoke, drink alcohol, or use illegal drugs. Some items may interact with your medicine. What should I watch for while using this medication? Tell your care team if your symptoms do not improve. Do not treat diarrhea with over the counter products. Contact your care team if you have diarrhea that lasts more than 2 days or if it is severe and watery. Do not take this medication just before going to bed. It may not dissolve properly when you lay down and can cause pain in your throat. Drink plenty of fluids while taking this medication to also help reduce irritation in your throat. This medication can make you more sensitive to the sun. Keep out of the sun. If you cannot avoid being in the sun, wear protective clothing and use sunscreen. Do not use sun lamps or tanning beds/booths. Birth control pills may not work properly while you are taking this medication. Talk  to your care team about using an extra method of birth control. If you are being treated for a sexually transmitted infection, avoid sexual contact until you have finished your treatment. Your sexual partner may also need treatment. If you are using  this medication to prevent malaria, you should still protect yourself from contact with mosquitos. Stay in screened-in areas, use mosquito nets, keep your body covered, and use an insect repellent. What side effects may I notice from receiving this medication? Side effects that you should report to your care team as soon as possible: Allergic reactions--skin rash, itching, hives, swelling of the face, lips, tongue, or throat Increased pressure around the brain--severe headache, change in vision, blurry vision, nausea, vomiting Joint pain Pain or trouble swallowing Redness, blistering, peeling, or loosening of the skin, including inside the mouth Severe diarrhea, fever Unusual vaginal discharge, itching, or odor Side effects that usually do not require medical attention (report these to your care team if they continue or are bothersome): Change in tooth color Diarrhea Headache Heartburn Nausea This list may not describe all possible side effects. Call your doctor for medical advice about side effects. You may report side effects to FDA at 1-800-FDA-1088. Where should I keep my medication? Keep out of the reach of children and pets. Store at room temperature, below 30 degrees C (86 degrees F). Protect from light. Keep container tightly closed. Throw away any unused medication after the expiration date. Taking this medication after the expiration date can make you seriously ill. NOTE: This sheet is a summary. It may not cover all possible information. If you have questions about this medicine, talk to your doctor, pharmacist, or health care provider.  2022 Elsevier/Gold Standard (2020-03-22 00:00:00) Foot Pain Many things can cause foot pain. Some common causes are: An injury. A sprain. Arthritis. Blisters. Bunions. Follow these instructions at home: Managing pain, stiffness, and swelling If directed, put ice on the painful area: Put ice in a plastic bag. Place a towel between your  skin and the bag. Leave the ice on for 20 minutes, 2-3 times a day.  Activity Do not stand or walk for long periods. Return to your normal activities as told by your health care provider. Ask your health care provider what activities are safe for you. Do stretches to relieve foot pain and stiffness as told by your health care provider. Do not lift anything that is heavier than 10 lb (4.5 kg), or the limit that you are told, until your health care provider says that it is safe. Lifting a lot of weight can put added pressure on your feet. Lifestyle Wear comfortable, supportive shoes that fit you well. Do not wear high heels. Keep your feet clean and dry. General instructions Take over-the-counter and prescription medicines only as told by your health care provider. Rub your foot gently. Pay attention to any changes in your symptoms. Keep all follow-up visits as told by your health care provider. This is important. Contact a health care provider if: Your pain does not get better after a few days of self-care. Your pain gets worse. You cannot stand on your foot. Get help right away if: Your foot is numb or tingling. Your foot or toes are swollen. Your foot or toes turn white or blue. You have warmth and redness along your foot. Summary Common causes of foot pain are injury, sprain, arthritis, blisters, or bunions. Ice, medicines, and comfortable shoes may help foot pain. Contact your health care provider if your pain  does not get better after a few days of self-care. This information is not intended to replace advice given to you by your health care provider. Make sure you discuss any questions you have with your health care provider. Document Revised: 04/09/2020 Document Reviewed: 04/09/2020 Elsevier Patient Education  [place].
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
## 2020-12-23 ENCOUNTER — Other Ambulatory Visit: Payer: Self-pay | Admitting: Adult Health

## 2020-12-23 DIAGNOSIS — M795 Residual foreign body in soft tissue: Secondary | ICD-10-CM

## 2020-12-23 NOTE — Progress Notes (Signed)
Orders Placed This Encounter  Procedures   Ambulatory referral to General Surgery    Referral Priority:   Urgent    Referral Type:   Surgical    Referral Reason:   Specialty Services Required    Requested Specialty:   General Surgery    Number of Visits Requested:   1     Foreign body (FB) in soft tissue - Plan: Ambulatory referral to General Surgery

## 2020-12-23 NOTE — Progress Notes (Signed)
Small 68mm foreign body within soft tissue of TMT joint.  Shows chronic deformity with known amputation.  Surgical referral placed to have foreign body removed/ evaluated.

## 2020-12-26 ENCOUNTER — Other Ambulatory Visit: Payer: Self-pay | Admitting: Podiatry

## 2020-12-26 ENCOUNTER — Ambulatory Visit (INDEPENDENT_AMBULATORY_CARE_PROVIDER_SITE_OTHER): Payer: Medicare PPO | Admitting: Podiatry

## 2020-12-26 ENCOUNTER — Ambulatory Visit (INDEPENDENT_AMBULATORY_CARE_PROVIDER_SITE_OTHER): Payer: Medicare PPO

## 2020-12-26 ENCOUNTER — Other Ambulatory Visit: Payer: Self-pay

## 2020-12-26 DIAGNOSIS — L03115 Cellulitis of right lower limb: Secondary | ICD-10-CM | POA: Diagnosis not present

## 2020-12-26 DIAGNOSIS — I96 Gangrene, not elsewhere classified: Secondary | ICD-10-CM

## 2020-12-26 NOTE — Addendum Note (Signed)
Addended by: Lind Guest on: 12/26/2020 12:47 PM   Modules accepted: Orders
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
## 2021-01-02 LAB — WOUND CULTURE

## 2021-01-06 ENCOUNTER — Ambulatory Visit (INDEPENDENT_AMBULATORY_CARE_PROVIDER_SITE_OTHER): Payer: Medicare PPO | Admitting: Podiatry

## 2021-01-06 ENCOUNTER — Other Ambulatory Visit: Payer: Self-pay

## 2021-01-06 DIAGNOSIS — L03115 Cellulitis of right lower limb: Secondary | ICD-10-CM

## 2021-01-06 DIAGNOSIS — L97512 Non-pressure chronic ulcer of other part of right foot with fat layer exposed: Secondary | ICD-10-CM

## 2021-01-06 DIAGNOSIS — I96 Gangrene, not elsewhere classified: Secondary | ICD-10-CM
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
## 2021-01-21 ENCOUNTER — Telehealth: Payer: Self-pay | Admitting: Podiatry

## 2021-01-21 NOTE — Telephone Encounter (Signed)
She call and said she still have not got a call for her mri or circulation test

## 2021-01-21 NOTE — Telephone Encounter (Signed)
[place] patient.  Could you please follow-up with the MRI and ABIs that were ordered.  The orders are there in her chart.  Thanks, Dr. Amalia Hailey

## 2021-01-23 ENCOUNTER — Telehealth: Admitting: Family

## 2021-01-23 ENCOUNTER — Other Ambulatory Visit: Payer: Self-pay

## 2021-01-23 NOTE — Telephone Encounter (Signed)
Noted, especially if pt swelling elsewhere with extensive infection around this could be spreading. Pt needs to go to er.

## 2021-01-23 NOTE — Progress Notes (Signed)
Erroneous encounter, pt not seen via video visit.

## 2021-01-23 NOTE — Telephone Encounter (Signed)
Reviewed patient's history prior to video visit scheduled for today, and given extent of pt's comorbidites and high risk factors, Pt needs to be seen in the office rather than video visit. Please call patient and advise her that we need to switch to an in office visit , and video visit would be negligent for appropriate care and I need to see patient to assess her appropriately.   If she is unable, given that she has an infection to right foot and seen by podiatrist 12/20 with worsening infection (suspected osteomyelitis)- if she has any other symptoms (sob, edema in other areas of body, fever) she needs to go to the ER.
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
## 2021-01-27 ENCOUNTER — Telehealth: Payer: Self-pay | Admitting: *Deleted

## 2021-01-27 DIAGNOSIS — L97519 Non-pressure chronic ulcer of other part of right foot with unspecified severity: Secondary | ICD-10-CM

## 2021-01-27 MED ORDER — DOXYCYCLINE HYCLATE 100 MG PO TABS: 100.0000 mg | ORAL_TABLET | Freq: Two times a day (BID) | ORAL | 0 refills | Status: DC

## 2021-01-27 NOTE — Telephone Encounter (Signed)
Please f/u with MRI. Update patient on status of MRI. Continue doxycycline as prescribed. I will see pt in office immediately after results. Recommend going to hospital but patient refuses as per my note. - Dr. Amalia Hailey

## 2021-01-27 NOTE — Addendum Note (Signed)
Addended by: Lolita Rieger on: 01/27/2021 06:24 PM   Modules accepted: Orders

## 2021-01-27 NOTE — Telephone Encounter (Signed)
I am returning your call.  Your MRI apparently requires authorization.  Dr. Amalia Hailey said he still recommends you go to the emergency room in regards to the infection in your foot.  He wants to know the extensiveness of the problems.  You have gangrene in your toe.  "Yes, I just don't want to sit up in the emergency room all day.  I have to find a ride.  I will go tomorrow."  Are you still taking the antibiotic?  "No, I ran out.  He call call me in some more if he likes.  I use [facility]."  I'll send the prescription in to your pharmacy.  So, you're going to go to the emergency room tomorrow?  "Yes, I'm going to try and go early tomorrow morning."

## 2021-01-27 NOTE — Telephone Encounter (Signed)
"  That piece of foreign object is still embedded.  It hurts really really bad.  I don't know what to do about it.  I can barely walk on it.  I haven't heard from the MRI people.  I heard from the circulation people for the ultrasound.  It's scheduled for the 18th.  What is the plan of action?  I've called several times.

## 2021-01-29 ENCOUNTER — Telehealth: Payer: Self-pay

## 2021-01-29 IMAGING — MG DIGITAL SCREENING BILAT W/ TOMO W/ CAD
6 of 10 series · 6 of 30 positions shown · non-contrast
Comparison: Previous exam(s).

CLINICAL DATA: Screening.

EXAM:
DIGITAL SCREENING BILATERAL MAMMOGRAM WITH TOMO AND CAD

[R MLO synth-2D]
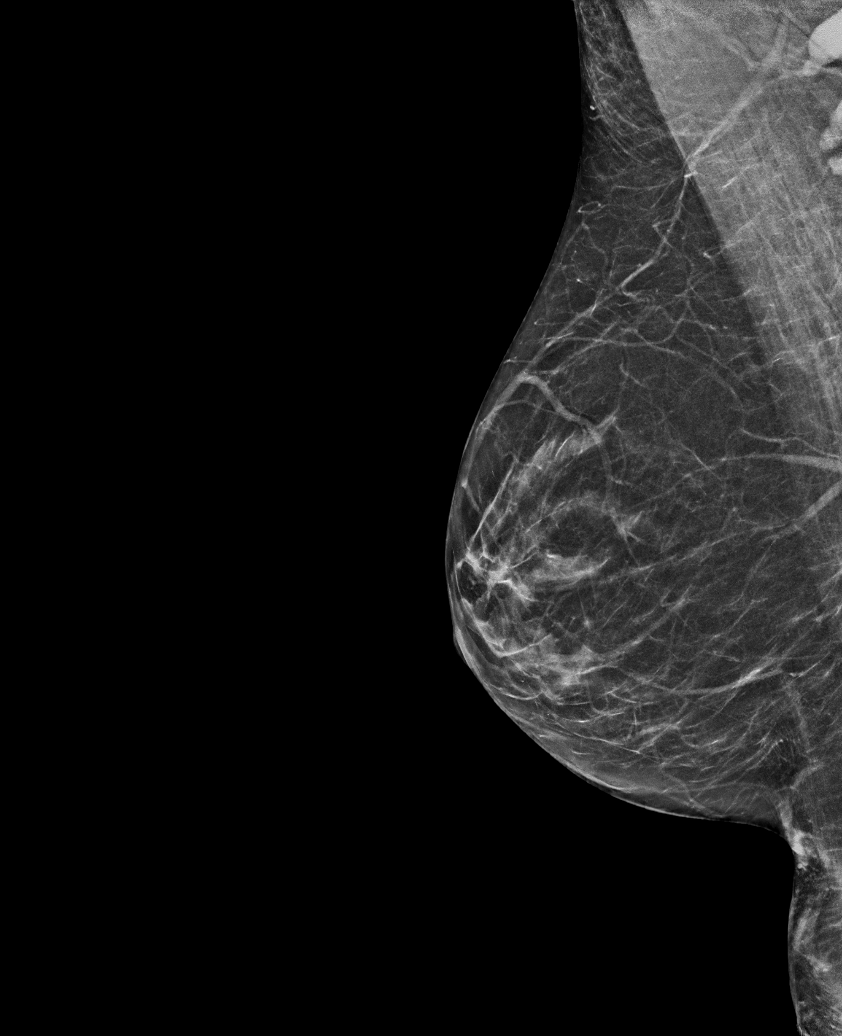

[R CC synth-2D (1 of 2)]
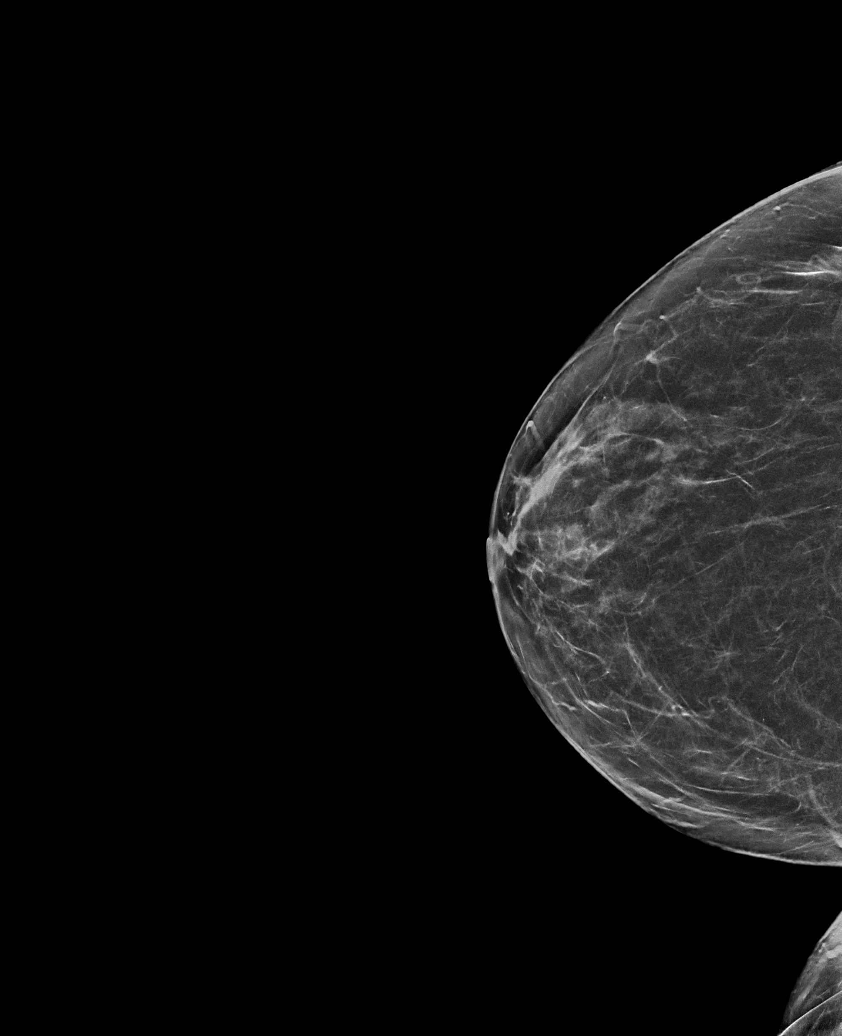

[L CC synth-2D]
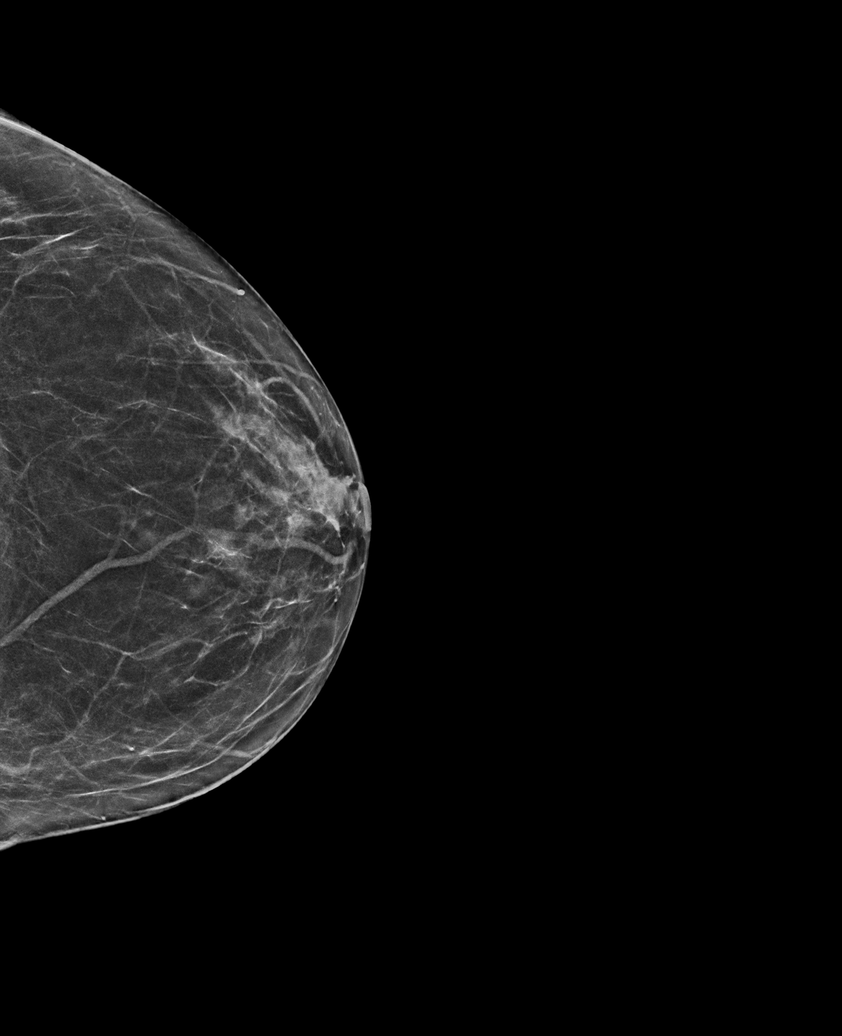

[R CC synth-2D (2 of 2)]
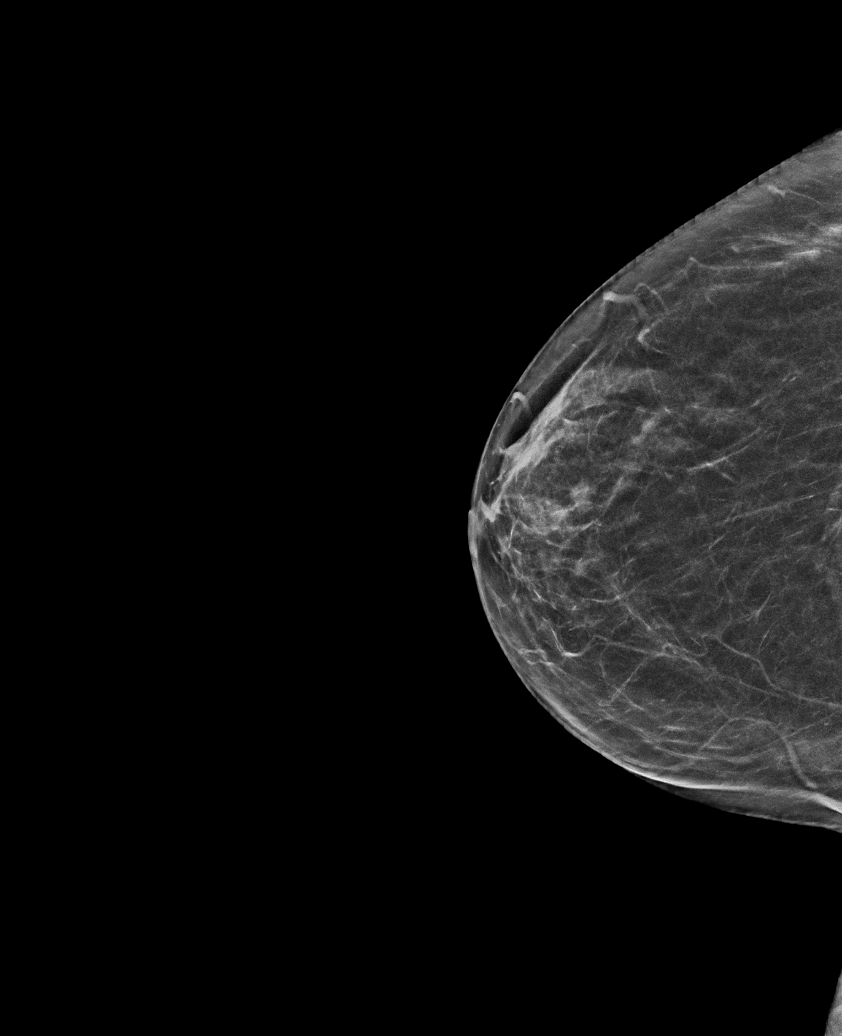

[L MLO synth-2D]
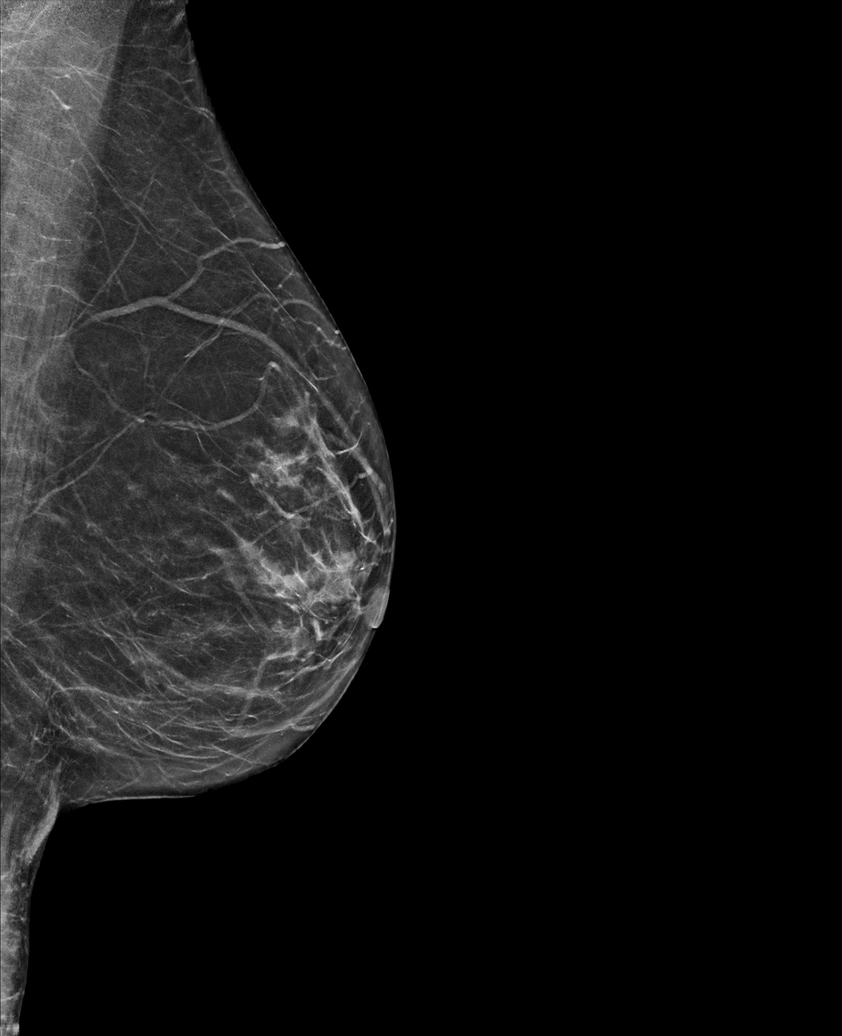

[R CC tomo · tomo slice 31/60.0]
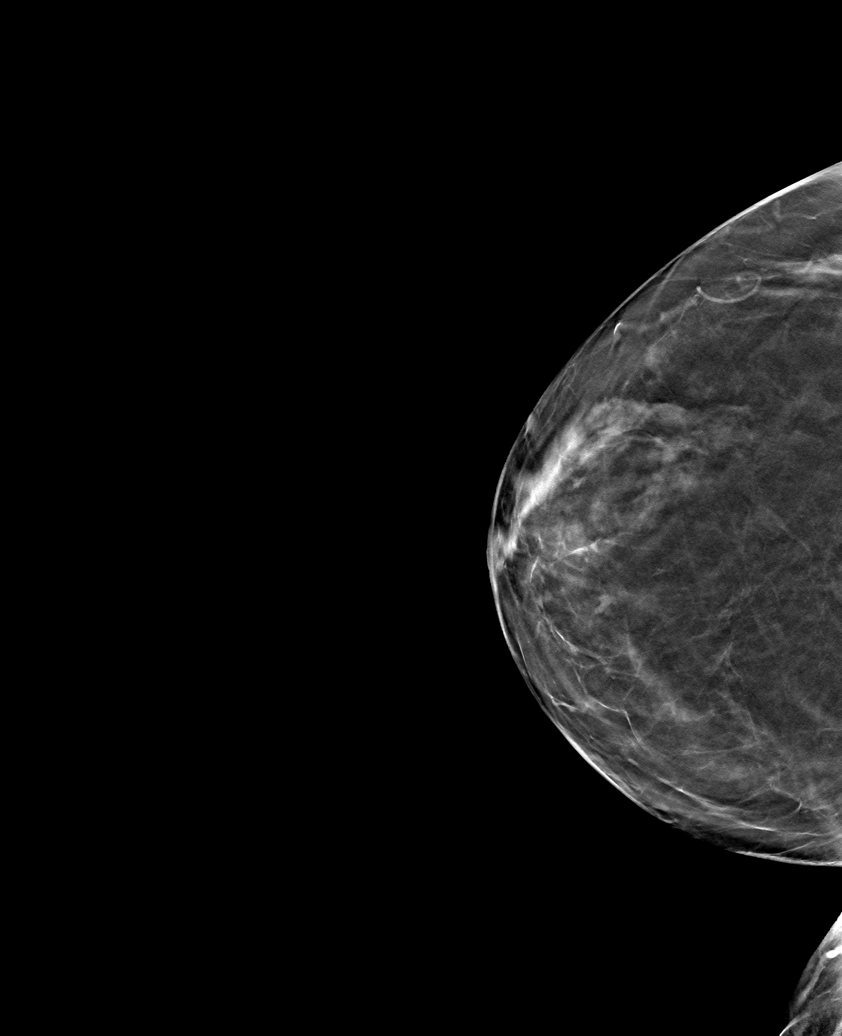

[6 of 30 positions shown; findings below may reference images not displayed]

ACR Breast Density Category b: There are scattered areas of
fibroglandular density.
FINDINGS: There are no findings suspicious for malignancy. Images were
processed with CAD.
IMPRESSION: No mammographic evidence of malignancy. A result letter of this
screening mammogram will be mailed directly to the patient.

RECOMMENDATION:
Screening mammogram in one year. (Code:[id number])

BI-RADS CATEGORY  1: Negative.

## 2021-01-29 NOTE — Telephone Encounter (Signed)
Per [facility], spoke with Truman Hayward, MRI has been approved from 01/29/2021 to 02/28/2021  Auth# [id number]  I called patient and left message for her to call back to get scheduled for her MRI.

## 2021-01-30 NOTE — Telephone Encounter (Signed)
MRI has been approved from 01/29/2021 to 02/28/2021  Auth # [id number] Patient has been notified and will call radiology scheduling to set up appt to her convenience

## 2021-02-02 ENCOUNTER — Encounter: Payer: Self-pay | Admitting: Urology

## 2021-02-02 ENCOUNTER — Ambulatory Visit: Payer: Self-pay | Admitting: Urology

## 2021-02-04 ENCOUNTER — Ambulatory Visit (INDEPENDENT_AMBULATORY_CARE_PROVIDER_SITE_OTHER): Payer: Medicare PPO

## 2021-02-04 ENCOUNTER — Other Ambulatory Visit: Payer: Self-pay

## 2021-02-04 DIAGNOSIS — I96 Gangrene, not elsewhere classified: Secondary | ICD-10-CM

## 2021-02-04 DIAGNOSIS — L03115 Cellulitis of right lower limb: Secondary | ICD-10-CM

## 2021-02-13 ENCOUNTER — Ambulatory Visit: Admission: RE | Admit: 2021-02-13 | Source: Ambulatory Visit

## 2021-02-24 ENCOUNTER — Telehealth: Payer: Self-pay | Admitting: Family Medicine

## 2021-02-24 ENCOUNTER — Encounter: Payer: Self-pay | Admitting: Family Medicine

## 2021-02-24 NOTE — Telephone Encounter (Signed)
Patient stated she is on antibiotics and always get yeast infection and would like doctor to call her in some medication to her pharmacy ( [facility] in [place] )

## 2021-02-25 MED ORDER — FLUCONAZOLE 150 MG PO TABS: 150.0000 mg | ORAL_TABLET | Freq: Once | ORAL | 0 refills | Status: AC

## 2021-02-25 NOTE — Telephone Encounter (Signed)
Please see the mychart message.

## 2021-03-02 ENCOUNTER — Ambulatory Visit (INDEPENDENT_AMBULATORY_CARE_PROVIDER_SITE_OTHER): Payer: Medicare PPO | Admitting: Psychology

## 2021-03-02 DIAGNOSIS — F331 Major depressive disorder, recurrent, moderate: Secondary | ICD-10-CM | POA: Diagnosis not present
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
## 2021-03-16 ENCOUNTER — Ambulatory Visit: Payer: Medicare PPO | Admitting: Psychology

## 2021-03-30 ENCOUNTER — Ambulatory Visit (INDEPENDENT_AMBULATORY_CARE_PROVIDER_SITE_OTHER): Payer: Medicare PPO | Admitting: Psychology

## 2021-03-30 DIAGNOSIS — F331 Major depressive disorder, recurrent, moderate: Secondary | ICD-10-CM
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
## 2021-04-07 ENCOUNTER — Telehealth (INDEPENDENT_AMBULATORY_CARE_PROVIDER_SITE_OTHER): Admitting: Adult Health

## 2021-04-07 ENCOUNTER — Encounter: Payer: Self-pay | Admitting: Adult Health

## 2021-04-07 DIAGNOSIS — Z87898 Personal history of other specified conditions: Secondary | ICD-10-CM

## 2021-04-07 DIAGNOSIS — R051 Acute cough: Secondary | ICD-10-CM

## 2021-04-07 DIAGNOSIS — J069 Acute upper respiratory infection, unspecified: Secondary | ICD-10-CM | POA: Insufficient documentation

## 2021-04-07 MED ORDER — BENZONATATE 100 MG PO CAPS: 100.0000 mg | ORAL_CAPSULE | Freq: Two times a day (BID) | ORAL | 0 refills | Status: DC | PRN

## 2021-04-07 MED ORDER — GUAIFENESIN 100 MG/5ML PO LIQD: 5.0000 mL | Freq: Four times a day (QID) | ORAL | 0 refills | Status: DC | PRN

## 2021-04-07 MED ORDER — ALBUTEROL SULFATE HFA 108 (90 BASE) MCG/ACT IN AERS: INHALATION_SPRAY | RESPIRATORY_TRACT | 1 refills | Status: DC

## 2021-04-07 NOTE — Patient Instructions (Addendum)
Advised in person evaluation at anytime is advised if any symptoms do not improve, worsen or change at any given time.  ?Red Flags discussed. The patient was given clear instructions to go to ER or return to medical center if any red flags develop, symptoms do not improve, worsen or new problems develop. They verbalized understanding. ? ?Also please schedule a follow up with your PCP within the next week for health maintenance and follow up's. ? ?Upper Respiratory Infection, Adult ?An upper respiratory infection (URI) affects the nose, throat, and upper airways that lead to the lungs. The most common type of URI is often called the common cold. URIs usually get better on their own, without medical treatment. ?What are the causes? ?A URI is caused by a germ (virus). You may catch these germs by: ?Breathing in droplets from an infected person's cough or sneeze. ?Touching something that has the germ on it (is contaminated) and then touching your mouth, nose, or eyes. ?What increases the risk? ?You are more likely to get a URI if: ?You are very young or very old. ?You have close contact with others, such as at work, school, or a health care facility. ?You smoke. ?You have long-term (chronic) heart or lung disease. ?You have a weakened disease-fighting system (immune system). ?You have nasal allergies or asthma. ?You have a lot of stress. ?You have poor nutrition. ?What are the signs or symptoms? ?Runny or stuffy (congested) nose. ?Cough. ?Sneezing. ?Sore throat. ?Headache. ?Feeling tired (fatigue). ?Fever. ?Not wanting to eat as much as usual. ?Pain in your forehead, behind your eyes, and over your cheekbones (sinus pain). ?Muscle aches. ?Redness or irritation of the eyes. ?Pressure in the ears or face. ?How is this treated? ?URIs usually get better on their own within 7-10 days. Medicines cannot cure URIs, but your doctor may recommend certain medicines to help relieve symptoms, such as: ?Over-the-counter cold  medicines. ?Medicines to reduce coughing (cough suppressants). Coughing is a type of defense against infection that helps to clear the nose, throat, windpipe, and lungs (respiratory system). Take these medicines only as told by your doctor. ?Medicines to lower your fever. ?Follow these instructions at home: ?Activity ?Rest as needed. ?If you have a fever, stay home from work or school until your fever is gone, or until your doctor says you may return to work or school. ?You should stay home until you cannot spread the infection anymore (you are not contagious). ?Your doctor may have you wear a face mask so you have less risk of spreading the infection. ?Relieving symptoms ?Rinse your mouth often with salt water. To make salt water, dissolve ?-1 tsp (3-6 g) of salt in 1 cup (237 mL) of warm water. ?Use a cool-mist humidifier to add moisture to the air. This can help you breathe more easily. ?Eating and drinking ? ?Drink enough fluid to keep your pee (urine) pale yellow. ?Eat soups and other clear broths. ?General instructions ? ?Take over-the-counter and prescription medicines only as told by your doctor. ?Do not smoke or use any products that contain nicotine or tobacco. If you need help quitting, ask your doctor. ?Avoid being where people are smoking (avoid secondhand smoke). ?Stay up to date on all your shots (immunizations), and get the flu shot every year. ?Keep all follow-up visits. ?How to prevent the spread of infection to others ? ?Wash your hands with soap and water for at least 20 seconds. If you cannot use soap and water, use hand sanitizer. ?Avoid touching your  mouth, face, eyes, or nose. ?Cough or sneeze into a tissue or your sleeve or elbow. Do not cough or sneeze into your hand or into the air. ?Contact a doctor if: ?You are getting worse, not better. ?You have any of these: ?A fever or chills. ?Brown or red mucus in your nose. ?Yellow or brown fluid (discharge)coming from your nose. ?Pain in your  face, especially when you bend forward. ?Swollen neck glands. ?Pain when you swallow. ?White areas in the back of your throat. ?Get help right away if: ?You have shortness of breath that gets worse. ?You have very bad or constant: ?Headache. ?Ear pain. ?Pain in your forehead, behind your eyes, and over your cheekbones (sinus pain). ?Chest pain. ?You have long-lasting (chronic) lung disease along with any of these: ?Making high-pitched whistling sounds when you breathe, most often when you breathe out (wheezing). ?Long-lasting cough (more than 14 days). ?Coughing up blood. ?A change in your usual mucus. ?You have a stiff neck. ?You have changes in your: ?Vision. ?Hearing. ?Thinking. ?Mood. ?These symptoms may be an emergency. Get help right away. Call 911. ?Do not wait to see if the symptoms will go away. ?Do not drive yourself to the hospital. ?Summary ?An upper respiratory infection (URI) is caused by a germ (virus). The most common type of URI is often called the common cold. ?URIs usually get better within 7-10 days. ?Take over-the-counter and prescription medicines only as told by your doctor. ?This information is not intended to replace advice given to you by your health care provider. Make sure you discuss any questions you have with your health care provider. ?Document Revised: 08/06/2020 Document Reviewed: 08/06/2020 ?[employer]vier Patient Education ? [employer]. ?Cough, Adult ?A cough helps to clear your throat and lungs. A cough may be a sign of an illness or another medical condition. ?An acute cough may only last 2-3 weeks, while a chronic cough may last 8 or more weeks. ?Many things can cause a cough. They include: ?Germs (viruses or bacteria) that attack the airway. ?Breathing in things that bother (irritate) your lungs. ?Allergies. ?Asthma. ?Mucus that runs down the back of your throat (postnasal drip). ?Smoking. ?Acid backing up from the stomach into the tube that moves food from the mouth to the  stomach (gastroesophageal reflux). ?Some medicines. ?Lung problems. ?Other medical conditions, such as heart failure or a blood clot in the lung (pulmonary embolism). ?Follow these instructions at home: ?Medicines ?Take over-the-counter and prescription medicines only as told by your doctor. ?Talk with your doctor before you take medicines that stop a cough (cough suppressants). ?Lifestyle ? ?Do not smoke, and try not to be around smoke. Do not use any products that contain nicotine or tobacco, such as cigarettes, e-cigarettes, and chewing tobacco. If you need help quitting, ask your doctor. ?Drink enough fluid to keep your pee (urine) pale yellow. ?Avoid caffeine. ?Do not drink alcohol if your doctor tells you not to drink. ?General instructions ? ?Watch for any changes in your cough. Tell your doctor about them. ?Always cover your mouth when you cough. ?Stay away from things that make you cough, such as perfume, candles, campfire smoke, or cleaning products. ?If the air is dry, use a cool mist vaporizer or humidifier in your home. ?If your cough is worse at night, try using extra pillows to raise your head up higher while you sleep. ?Rest as needed. ?Keep all follow-up visits as told by your doctor. This is important. ?Contact a doctor if: ?You have  new symptoms. ?You cough up pus. ?Your cough does not get better after 2-3 weeks, or your cough gets worse. ?Cough medicine does not help your cough and you are not sleeping well. ?You have pain that gets worse or pain that is not helped with medicine. ?You have a fever. ?You are losing weight and you do not know why. ?You have night sweats. ?Get help right away if: ?You cough up blood. ?You have trouble breathing. ?Your heartbeat is very fast. ?These symptoms may be an emergency. Do not wait to see if the symptoms will go away. Get medical help right away. Call your local emergency services (911 in the [place] Do not drive yourself to the hospital. ?Summary ?A cough  helps to clear your throat and lungs. Many things can cause a cough. ?Take over-the-counter and prescription medicines only as told by your doctor. ?Always cover your mouth when you cough. ?Contact a doctor if you

## 2021-04-07 NOTE — Progress Notes (Addendum)
Virtual Visit via Video Note ? ?I connected with [name] on 04/07/21 at  9:30 AM EDT by a video enabled telemedicine application and verified that I am speaking with the correct person using two identifiers. ? ?Location: ?Patient: at home  ?Provider: Provider: Provider's office at  [facility], [place]. ?  ? ?  ?I discussed the limitations of evaluation and management by telemedicine and the availability of in person appointments. The patient expressed understanding and agreed to proceed. ? ?History of Present Illness: ?Patient is a 46 year old female in  no acute distress with cough , nasal congestion, chest and head congestion. Onset symptoms 2 days ago. No thermometer - she does not feel like she has a fever. She has been on ibuprofen, and body aches.  ?Non productive cough. Denies any hemoptysis.  ?Mild wheezing  " a little " mild needs inhaler. Trying adult tussin - equate brand not helping cough much.  ?Denies any edema or orthopnea.  ?She as had a father sick with Upper respiratory infection.  ?Denies any history of pneumonia.  ?Covid at home test negative yesterday. Denies diaphoresis.  ?Patient  denies any fever, body aches,chills, rash, chest pain, shortness of breath, nausea, vomiting, or diarrhea.  ?Denies dizziness, lightheadedness, pre syncopal or syncopal episodes.  ?Smoker.  ?  ?Observations/Objective: ?No vital signs available.  ? ?Patient is alert and oriented and responsive to questions Engages in conversation with provider. Speaks in full sentences without any pauses without any shortness of breath or distress.   ?Assessment and Plan: ? ? ?The primary encounter diagnosis was Acute cough. Diagnoses of Upper respiratory tract infection, unspecified type and History of wheezing were also pertinent to this visit.  ?Orders Placed This Encounter  ?Procedures  ? DG Chest 2 View  ?  Order Specific Question:   Reason for Exam (SYMPTOM  OR DIAGNOSIS REQUIRED)   ?  Answer:   cough. rule out pneumonia  ?  Order Specific Question:   Preferred imaging location?  ?  Answer:   [facility]  ? CBC with Differential/Platelet  ?  Standing Status:   Future  ?  Standing Expiration Date:   04/08/2022  ? Comprehensive metabolic panel  ?  Standing Status:   Future  ?  Standing Expiration Date:   04/08/2022  ?  ?Meds ordered this encounter  ?Medications  ? albuterol (VENTOLIN HFA) 108 (90 Base) MCG/ACT inhaler  ?  Sig: INHALE 2 PUFFS EVERY 6 HOURS AS NEEDED FOR WHEEZE OR SHORTNESS OF BREATH  ?  Dispense:  18 each  ?  Refill:  1  ? benzonatate (TESSALON) 100 MG capsule  ?  Sig: Take 1 capsule (100 mg total) by mouth 2 (two) times daily as needed for cough.  ?  Dispense:  20 capsule  ?  Refill:  0  ? guaiFENesin (ROBITUSSIN) 100 MG/5ML liquid  ?  Sig: Take 5 mLs by mouth every 6 (six) hours as needed for cough or to loosen phlegm.  ?  Dispense:  120 mL  ?  Refill:  0  ?  ?Orders Placed This Encounter  ?Procedures  ? DG Chest 2 View  ? CBC with Differential/Platelet  ? Comprehensive metabolic panel  ? [facility], C[facility].  ?Albuterol refilled. [facility] and [facility]. She has had symptoms x 2 days. Recommend covid, flu, rsv- and strep testing she declined. She is advised day 3 -  5 best day to test offered office testing. She can call for testing. She reports she has had one negative at home covid test- advised this could be too early testing.  ?Rest and hydrate. Obtain thermometer.  ? ?Return in about 1 week (around 04/14/2021), or if symptoms worsen or fail to improve, for at any time for any worsening symptoms, Go to Emergency room/ urgent care if worse.  ?  ?Advised in person evaluation at anytime is advised if any symptoms do not improve, worsen or change at any given time.  ?Red Flags discussed. The patient was given clear instructions to go to ER or return to medical center if any red flags develop, symptoms do not improve, worsen or new  problems develop. They verbalized understanding.  ? ? ?Follow Up Instructions: ? ?  ?I discussed the assessment and treatment plan with the patient. The patient was provided an opportunity to ask questions and all were answered. The patient agreed with the plan and demonstrated an understanding of the instructions. ?  ?The patient was advised to call back or seek an in-person evaluation if the symptoms worsen or if the condition fails to improve as anticipated. ? ?I provided 22 minutes of non-face-to-face time during this encounter. ? ? ?Also please schedule a follow up with your PCP within the next week for health maintenance and follow up's. ?Marcille Buffy, FNP  ?

## 2021-04-13 ENCOUNTER — Ambulatory Visit: Payer: Medicare PPO | Admitting: Psychology

## 2021-04-13 NOTE — Progress Notes (Incomplete)
? ? ? ? ? ? ? ? ? ?[name] is a 46 y.o. female patient . ? ?Diagnosis ?296.30 (Major depressive affective disorder, recurrent episode, unspecified) [n/a]  ?Symptoms ?Depressed or irritable mood. (Status: maintained) -- No Description Entered  ?Diminished interest in or enjoyment of activities. (Status: maintained) -- No Description Entered  ?Feelings of hopelessness, worthlessness, or inappropriate guilt. (Status: maintained) -- No Description Entered  ?Lack of energy. (Status: maintained) -- No Description Entered  ?Unresolved grief issues. (Status: maintained) -- No Description Entered  ?Medication Status ?compliance  ?Safety ?none  ?If Suicidal or Homicidal State Action Taken: unspecified  ?Current Risk: low ?Medications ?Hydroxyzine (Dosage: unknown)  ?Remiron (Dosage: unknown)  ?Sertraline (Dosage: unknown)  ?Objectives ?Related Problem: Appropriately grieve the loss in order to normalize mood and to return to previously adaptive level of functioning. ?Description: Increasingly verbalize hopeful and positive statements regarding self, others, and the future. ?Target Date: 2021-07-14 ?Frequency: Daily ?Modality: individual ?Progress: 60%  ?Related Problem: Appropriately grieve the loss in order to normalize mood and to return to previously adaptive level of functioning. ?Description: Learn and implement conflict resolution skills to resolve interpersonal problems. ?Target Date: 2021-07-14 ?Frequency: Daily ?Modality: individual ?Progress: 70% ? ?Related Problem: Appropriately grieve the loss in order to normalize mood and to return to previously adaptive level of functioning. ?Description: Learn and implement problem-solving and decision-making skills. ?Target Date: 2021-07-14 ?Frequency: Daily ?Modality: individual ?Progress: 60%  ?Related Problem: Appropriately grieve the loss in order to normalize mood and to return to previously adaptive level of functioning. ?Description: Describe current and  past experiences with depression including their impact on functioning and attempts to resolve it. ?Target Date: 2021-07-14 ?Frequency: Daily ?Modality: individual ?Progress: 70% ? ?Related Problem: Complete the process of letting go of the lost significant other. ?Description: Report decreased time spent each day focusing on the loss. ?Target Date: 2021-07-14 ?Frequency: Daily ?Modality: individual ?Progress: 80%  ?Related Problem: Complete the process of letting go of the lost significant other. ?Description: Decrease unrealistic thoughts, statements, and feelings of being responsible for the loss. ?Target Date: 2021-07-14 ?Frequency: Daily ?Modality: individual ?Progress: 70% ? ?Related Problem: Complete the process of letting go of the lost significant other. ?Description: Verbalize and resolve feelings of anger or guilt focused on self or deceased loved one that interfere with the grieving process. ?Target Date: 2021-07-14 ?Frequency: Daily ?Modality: individual ?Progress: 80% ? ?Related Problem: Complete the process of letting go of the lost significant other. ?Description: Begin verbalizing feelings associated with the loss. ?Target Date: 2021-07-14 ?Frequency: Daily ?Modality: individual ?Progress: 80% ? ?Related Problem: Complete the process of letting go of the lost significant other. ?Description: Identify what stages of grief have been experienced in the continuum of the grieving process. ?Target Date: 2021-07-14 ?Frequency: Daily ?Modality: individual ?Progress: 80% ? ?Related Problem: Complete the process of letting go of the lost significant other. ?Description: Participate in a therapy that addresses issues beyond grief that have arisen as a result of the loss. ?Target Date: 2021-07-14 ?Frequency: Daily ?Modality: individual ?Progress: 70% ? ?Related Problem: Complete the process of letting go of the lost significant other. ?Description: Tell in detail the story of the current loss that is triggering  symptoms. ?Target Date: 2021-07-14 ?Frequency: Daily ?Modality: individual ?Progress: 100% ? ?Client Response ?full compliance  ?Service Location ?Location, [address] [place], [place] [address]  ?Service Code ?cpt [id number]  ?Validate/empathize  ?Related past to present  ?Behavioral activation plan  ?Facilitate problem solving  ?Identify automatic thoughts  ?  Rationally challenge thoughts or beliefs/cognitive restructuring  ?Identify/label emotions  ?Emotion regulation skills  ?Psychiatrist  ?Self care activities  ?Self-monitoring  ?Session Notes: ?Patient requests a video Webex session due to the coronavirus. She is at home and I am at my home office.  ?Dx: Major Depression  ?Meds: Lexapro ('20mg'$ ), Busperone (7.5)  ?Goals: States that she is seeking counseling to learn to manage the grief reaction to losing her husband. She reports feeling tired all the time and that she has no energy or motivation. Needs to develop coping strategies. Therapy to focus on reducing despair, sadness, hopelessness and helplessness. 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 Will explore those early traumas/losses and current triggers. Goal dates: 06-2021 Revised Goals: Patient is doing much better with her grief/mourning process. She now needs help managing her reactions to her dysfunctional FOO. In addition, her life is out of control and she need help to focus and establish a plan. Continues to feel depressed and overwhelmed. Goal date is 6-23  ?[name]      ?Kelly Morel, PhD Time: 7:40a-8:30a 50 minutes      ? ? ? ? ? ? ?

## 2021-04-26 ENCOUNTER — Other Ambulatory Visit: Payer: Self-pay | Admitting: Family Medicine

## 2021-04-26 DIAGNOSIS — F419 Anxiety disorder, unspecified: Secondary | ICD-10-CM

## 2021-04-26 DIAGNOSIS — F32A Depression, unspecified: Secondary | ICD-10-CM

## 2021-04-27 ENCOUNTER — Ambulatory Visit: Payer: Medicare PPO | Admitting: Psychology

## 2021-04-27 NOTE — Telephone Encounter (Signed)
This patient is due for follow-up with me. I will send one refill in but please get her scheduled for follow-up. Thanks.  ?

## 2021-04-28 ENCOUNTER — Ambulatory Visit: Payer: Medicare PPO | Admitting: Psychology

## 2021-04-28 NOTE — Progress Notes (Incomplete)
? ? ? ? ? ? ? ? ? ? ? ? ? ? ? ? ? ? ? ? ? ? ? ? ?[name] is a 46 y.o. female patient . ? ?Diagnosis ?296.30 (Major depressive affective disorder, recurrent episode, unspecified) [n/a]  ?Symptoms ?Depressed or irritable mood. (Status: maintained) -- No Description Entered  ?Diminished interest in or enjoyment of activities. (Status: maintained) -- No Description Entered  ?Feelings of hopelessness, worthlessness, or inappropriate guilt. (Status: maintained) -- No Description Entered  ?Lack of energy. (Status: maintained) -- No Description Entered  ?Unresolved grief issues. (Status: maintained) -- No Description Entered  ?Medication Status ?compliance  ?Safety ?none  ?If Suicidal or Homicidal State Action Taken: unspecified  ?Current Risk: low ?Medications ?Hydroxyzine (Dosage: unknown)  ?Remiron (Dosage: unknown)  ?Sertraline (Dosage: unknown)  ?Objectives ?Related Problem: Appropriately grieve the loss in order to normalize mood and to return to previously adaptive level of functioning. ?Description: Increasingly verbalize hopeful and positive statements regarding self, others, and the future. ?Target Date: 2021-07-14 ?Frequency: Daily ?Modality: individual ?Progress: 60%  ?Related Problem: Appropriately grieve the loss in order to normalize mood and to return to previously adaptive level of functioning. ?Description: Learn and implement conflict resolution skills to resolve interpersonal problems. ?Target Date: 2021-07-14 ?Frequency: Daily ?Modality: individual ?Progress: 70% ? ?Related Problem: Appropriately grieve the loss in order to normalize mood and to return to previously adaptive level of functioning. ?Description: Learn and implement problem-solving and decision-making skills. ?Target Date: 2021-07-14 ?Frequency: Daily ?Modality: individual ?Progress: 60%  ?Related Problem: Appropriately grieve the loss in order to normalize mood and to return to previously adaptive level of  functioning. ?Description: Describe current and past experiences with depression including their impact on functioning and attempts to resolve it. ?Target Date: 2021-07-14 ?Frequency: Daily ?Modality: individual ?Progress: 70% ? ?Related Problem: Complete the process of letting go of the lost significant other. ?Description: Report decreased time spent each day focusing on the loss. ?Target Date: 2021-07-14 ?Frequency: Daily ?Modality: individual ?Progress: 80%  ?Related Problem: Complete the process of letting go of the lost significant other. ?Description: Decrease unrealistic thoughts, statements, and feelings of being responsible for the loss. ?Target Date: 2021-07-14 ?Frequency: Daily ?Modality: individual ?Progress: 70% ? ?Related Problem: Complete the process of letting go of the lost significant other. ?Description: Verbalize and resolve feelings of anger or guilt focused on self or deceased loved one that interfere with the grieving process. ?Target Date: 2021-07-14 ?Frequency: Daily ?Modality: individual ?Progress: 80% ? ?Related Problem: Complete the process of letting go of the lost significant other. ?Description: Begin verbalizing feelings associated with the loss. ?Target Date: 2021-07-14 ?Frequency: Daily ?Modality: individual ?Progress: 80% ? ?Related Problem: Complete the process of letting go of the lost significant other. ?Description: Identify what stages of grief have been experienced in the continuum of the grieving process. ?Target Date: 2021-07-14 ?Frequency: Daily ?Modality: individual ?Progress: 80% ? ?Related Problem: Complete the process of letting go of the lost significant other. ?Description: Participate in a therapy that addresses issues beyond grief that have arisen as a result of the loss. ?Target Date: 2021-07-14 ?Frequency: Daily ?Modality: individual ?Progress: 70% ? ?Related Problem: Complete the process of letting go of the lost significant other. ?Description: Tell in detail  the story of the current loss that is triggering symptoms. ?Target Date: 2021-07-14 ?Frequency: Daily ?Modality: individual ?Progress: 100% ? ?Client Response ?full compliance  ?Service Location ?Location, [address] [place], [place] [address]  ?Service Code ?cpt [id number]  ?Validate/empathize  ?Related  past to present  ?Behavioral activation plan  ?Facilitate problem solving  ?Identify automatic thoughts  ?Rationally challenge thoughts or beliefs/cognitive restructuring  ?Identify/label emotions  ?Emotion regulation skills  ?Psychiatrist  ?Self care activities  ?Self-monitoring  ?Session Notes: ?Patient requests a video Webex session due to the coronavirus. She is at home and I am at my home office.  ?Dx: Major Depression  ?Meds: Lexapro ('20mg'$ ), Busperone (7.5)  ?Goals: States that she is seeking counseling to learn to manage the grief reaction to losing her husband. She reports feeling tired all the time and that she has no energy or motivation. Needs to develop coping strategies. Therapy to focus on reducing despair, sadness, hopelessness and helplessness. 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 Will explore those early traumas/losses and current triggers. Goal dates: 06-2021 Revised Goals: Patient is doing much better with her grief/mourning process. She now needs help managing her reactions to her dysfunctional FOO. In addition, her life is out of control and she need help to focus and establish a plan. Continues to feel depressed and overwhelmed. Goal date is 6-23  ?[name]      ?Kelly Morel, PhD Time: 7:40a-8:30a 50 minutes      ? ? ? ? ? ? ?

## 2021-05-07 ENCOUNTER — Telehealth: Payer: Self-pay | Admitting: Family Medicine

## 2021-05-07 NOTE — Telephone Encounter (Signed)
Left message for patient to return my call.

## 2021-05-07 NOTE — Telephone Encounter (Signed)
Pt called in requesting referral for MRI of her right foot... Advised Pt that Dr. Caryl Bis may request her to do a office visit... Pt stated that is fine... Pt stated that she was referred to podiatry and that she didn't like the care that she was given... Pt stated that podiatry referred her to get an MRI/Ultrasound of right foot... Advised pt that the best thing to do is to go back to who referred  her to get the MRI and have them refer her to MRI/Ultrasound again... Pt stated that she just rather start fresh by coming to see Dr. Caryl Bis... Spoke with Odis Hollingshead stated that Pt needs to return back to podiatry to be refer/schedule for an Ultrasound/MRI... Called pt back to advise... Pt requesting callback...  ?

## 2021-05-08 NOTE — Telephone Encounter (Signed)
Left message to return my call.  

## 2021-05-11 ENCOUNTER — Ambulatory Visit: Payer: Medicare PPO | Admitting: Psychology

## 2021-05-12 NOTE — Telephone Encounter (Signed)
Left message to return call to office.

## 2021-05-14 ENCOUNTER — Telehealth: Payer: Self-pay | Admitting: Podiatry

## 2021-05-14 NOTE — Telephone Encounter (Signed)
Patient is ready to reschedule the MRI  , it will need reauthorized .  Please advise . (Could not get it done when she was suppose to her husband passed away)

## 2021-05-14 NOTE — Telephone Encounter (Signed)
Spoke with patient she will speak with podiatry to reschedule MRI and if not rescheduled she will call back to schedule Appt with PCP. ?

## 2021-05-15 ENCOUNTER — Other Ambulatory Visit: Payer: Self-pay | Admitting: Podiatry

## 2021-05-15 DIAGNOSIS — L03115 Cellulitis of right lower limb: Secondary | ICD-10-CM

## 2021-05-15 DIAGNOSIS — L97519 Non-pressure chronic ulcer of other part of right foot with unspecified severity: Secondary | ICD-10-CM

## 2021-05-15 NOTE — Progress Notes (Signed)
Reorder MRI RT foot ?

## 2021-05-15 NOTE — Telephone Encounter (Signed)
Good morning [name]!  ?Could we please work on getting this patient reauthorized for MRI.  I just placed a new order today.  Thanks, Dr. Amalia Hailey

## 2021-05-19 ENCOUNTER — Other Ambulatory Visit: Payer: Self-pay | Admitting: Podiatry

## 2021-05-19 ENCOUNTER — Telehealth: Payer: Self-pay | Admitting: Podiatry

## 2021-05-19 DIAGNOSIS — L97519 Non-pressure chronic ulcer of other part of right foot with unspecified severity: Secondary | ICD-10-CM

## 2021-05-19 DIAGNOSIS — L97512 Non-pressure chronic ulcer of other part of right foot with fat layer exposed: Secondary | ICD-10-CM

## 2021-05-19 NOTE — Telephone Encounter (Signed)
MRI orders changed to [facility] hospital.  Thanks, Dr. Amalia Hailey

## 2021-05-19 NOTE — Telephone Encounter (Signed)
Pt  needs radiology sent to [place] instead of [place]. Pt in a lot of pain. ? ?Please advise ?

## 2021-05-20 NOTE — Telephone Encounter (Signed)
Spoke with Patient and udated her on her MRI order change. ?

## 2021-05-25 ENCOUNTER — Ambulatory Visit (INDEPENDENT_AMBULATORY_CARE_PROVIDER_SITE_OTHER): Payer: Medicare PPO | Admitting: Psychology

## 2021-05-25 DIAGNOSIS — F331 Major depressive disorder, recurrent, moderate: Secondary | ICD-10-CM

## 2021-05-25 NOTE — Progress Notes (Signed)
? ?[name] is a 46 y.o. female patient . ? ?Diagnosis ?296.30 (Major depressive affective disorder, recurrent episode, unspecified) [n/a]  ?Symptoms ?Depressed or irritable mood. (Status: maintained) -- No Description Entered  ?Diminished interest in or enjoyment of activities. (Status: maintained) -- No Description Entered  ?Feelings of hopelessness, worthlessness, or inappropriate guilt. (Status: maintained) -- No Description Entered  ?Lack of energy. (Status: maintained) -- No Description Entered  ?Unresolved grief issues. (Status: maintained) -- No Description Entered  ?Medication Status ?compliance  ?Safety ?none  ?If Suicidal or Homicidal State Action Taken: unspecified  ?Current Risk: low ?Medications ?Hydroxyzine (Dosage: unknown)  ?Remiron (Dosage: unknown)  ?Sertraline (Dosage: unknown)  ?Objectives ?Related Problem: Appropriately grieve the loss in order to normalize mood and to return to previously adaptive level of functioning. ?Description: Increasingly verbalize hopeful and positive statements regarding self, others, and the future. ?Target Date: 2021-07-14 ?Frequency: Daily ?Modality: individual ?Progress: 60%  ?Related Problem: Appropriately grieve the loss in order to normalize mood and to return to previously adaptive level of functioning. ?Description: Learn and implement conflict resolution skills to resolve interpersonal problems. ?Target Date: 2021-07-14 ?Frequency: Daily ?Modality: individual ?Progress: 70% ? ?Related Problem: Appropriately grieve the loss in order to normalize mood and to return to previously adaptive level of functioning. ?Description: Learn and implement problem-solving and decision-making skills. ?Target Date: 2021-07-14 ?Frequency: Daily ?Modality: individual ?Progress: 60%  ?Related Problem: Appropriately grieve the loss in order to normalize mood and to return to previously adaptive level of functioning. ?Description: Describe current and past experiences  with depression including their impact on functioning and attempts to resolve it. ?Target Date: 2021-07-14 ?Frequency: Daily ?Modality: individual ?Progress: 70% ? ?Related Problem: Complete the process of letting go of the lost significant other. ?Description: Report decreased time spent each day focusing on the loss. ?Target Date: 2021-07-14 ?Frequency: Daily ?Modality: individual ?Progress: 80%  ?Related Problem: Complete the process of letting go of the lost significant other. ?Description: Decrease unrealistic thoughts, statements, and feelings of being responsible for the loss. ?Target Date: 2021-07-14 ?Frequency: Daily ?Modality: individual ?Progress: 70% ? ?Related Problem: Complete the process of letting go of the lost significant other. ?Description: Verbalize and resolve feelings of anger or guilt focused on self or deceased loved one that interfere with the grieving process. ?Target Date: 2021-07-14 ?Frequency: Daily ?Modality: individual ?Progress: 80% ? ?Related Problem: Complete the process of letting go of the lost significant other. ?Description: Begin verbalizing feelings associated with the loss. ?Target Date: 2021-07-14 ?Frequency: Daily ?Modality: individual ?Progress: 80% ? ?Related Problem: Complete the process of letting go of the lost significant other. ?Description: Identify what stages of grief have been experienced in the continuum of the grieving process. ?Target Date: 2021-07-14 ?Frequency: Daily ?Modality: individual ?Progress: 80% ? ?Related Problem: Complete the process of letting go of the lost significant other. ?Description: Participate in a therapy that addresses issues beyond grief that have arisen as a result of the loss. ?Target Date: 2021-07-14 ?Frequency: Daily ?Modality: individual ?Progress: 70% ? ?Related Problem: Complete the process of letting go of the lost significant other. ?Description: Tell in detail the story of the current loss that is triggering  symptoms. ?Target Date: 2021-07-14 ?Frequency: Daily ?Modality: individual ?Progress: 100% ? ?Client Response ?full compliance  ?Service Location ?Location, [address] [place],  [address]  ?Service Code ?cpt [id number]  ?Validate/empathize  ?Related past to present  ?Behavioral activation plan  ?Facilitate problem solving  ?Identify automatic thoughts  ?Rationally challenge thoughts or beliefs/cognitive restructuring  ?  Identify/label emotions  ?Emotion regulation skills  ?Psychiatrist  ?Self care activities  ?Self-monitoring  ?Session Notes: ?Patient requests a video Webex session due to the coronavirus. She is at home and I am at my home office.  ?Dx: Major Depression  ?Meds: Lexapro ('20mg'$ ), Busperone (7.5)  ?Goals: States that she is seeking counseling to learn to manage the grief reaction to losing her husband. She reports feeling tired all the time and that she has no energy or motivation. Needs to develop coping strategies. Therapy to focus on reducing despair, sadness, hopelessness and helplessness. 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 Will explore those early traumas/losses and current triggers. Goal dates: 04-2021 Revised Goals: Patient is doing much better with her grief/mourning process. She now needs help managing her reactions to her dysfunctional FOO. In addition, her life is out of control and she need help to focus and establish a plan. Continues to feel depressed and overwhelmed. Goal date is 6-23  ?[name] says things have been chaotic. She had to move out of her house and says she only had minutes to get her belongings out of her house because she was sick and had to wait until the last day to leave. She is in a trailer near to her parent's home. It was very stressful but she was able to get in the the new place. The woman and her daughter that she was close to also moved to the same trailer park. [name] now gets to see the  little girl every day and is very pleased. She says that she is happier than before and is feeling less stress. She is also in a "better place" with regard to her grief. She feels she is moving on. This is the most settled she has been since Jay's death. She states that her therapy has helped her get better control of her life. She states that her father has been more protective of her and she is pleased to be living near them. Both of her parents have been more supportive of her and their relationship is improving. We talked about setting goals and getting in more control of her life. Her plan now is to take care of some of her physical/medical issues.          ?[name], PhD Time: 7:40a-8:30a 50 minutes      ? ? ? ? ? ? ?

## 2021-06-02 NOTE — Telephone Encounter (Signed)
Patient has been scheduled for 06/17/21

## 2021-06-08 ENCOUNTER — Ambulatory Visit (INDEPENDENT_AMBULATORY_CARE_PROVIDER_SITE_OTHER): Payer: Medicare PPO | Admitting: Psychology

## 2021-06-08 DIAGNOSIS — F331 Major depressive disorder, recurrent, moderate: Secondary | ICD-10-CM | POA: Diagnosis not present

## 2021-06-08 NOTE — Progress Notes (Signed)
Willis Genoveva Singleton is a 46 y.o. female patient .  Diagnosis 296.30 (Major depressive affective disorder, recurrent episode, unspecified) [n/a]  Symptoms Depressed or irritable mood. (Status: maintained) -- No Description Entered  Diminished interest in or enjoyment of activities. (Status: maintained) -- No Description Entered  Feelings of hopelessness, worthlessness, or inappropriate guilt. (Status: maintained) -- No Description Entered  Lack of energy. (Status: maintained) -- No Description Entered  Unresolved grief issues. (Status: maintained) -- No Description Entered  Medication Status compliance  Safety none  If Suicidal or Homicidal State Action Taken: unspecified  Current Risk: low Medications Hydroxyzine (Dosage: unknown)  Remiron (Dosage: unknown)  Sertraline (Dosage: unknown)  Objectives Related Problem: Appropriately grieve the loss in order to normalize mood and to return to previously adaptive level of functioning. Description: Increasingly verbalize hopeful and positive statements regarding self, others, and the future. Target Date: 2021-07-14 Frequency: Daily Modality: individual Progress: 100% -completed Related Problem: Appropriately grieve the loss in order to normalize mood and to return to previously adaptive level of functioning. Description: Learn and implement conflict resolution skills to resolve interpersonal problems. Target Date: 2022-01-17 Frequency: Daily Modality: individual Progress: 80%  Related Problem: Appropriately grieve the loss in order to normalize mood and to return to previously adaptive level of functioning. Description: Learn and implement problem-solving and decision-making skills. Target Date: 2022-01-17 Frequency: Daily Modality: individual Progress: 80%  Related Problem: Appropriately grieve the loss in order to normalize mood and to return to previously adaptive level of functioning. Description: Describe  current and past experiences with depression including their impact on functioning and attempts to resolve it. Target Date: 2022-01-17 Frequency: Daily Modality: individual Progress: 80%  Related Problem: Complete the process of letting go of the lost significant other. Description: Report decreased time spent each day focusing on the loss. Target Date: 2021-07-14 Frequency: Daily Modality: individual Progress: 100% -completed Related Problem: Complete the process of letting go of the lost significant other. Description: Decrease unrealistic thoughts, statements, and feelings of being responsible for the loss. Target Date: 2021-07-14 Frequency: Daily Modality: individual Progress: 100%-completed  Related Problem: Complete the process of letting go of the lost significant other. Description: Verbalize and resolve feelings of anger or guilt focused on self or deceased loved one that interfere with the grieving process. Target Date: 2021-07-14 Frequency: Daily Modality: individual Progress: 100%-completed  Related Problem: Complete the process of letting go of the lost significant other. Description: Begin verbalizing feelings associated with the loss. Target Date: 2022-01-17 Frequency: Daily Modality: individual Progress: 90%  Related Problem: Complete the process of letting go of the lost significant other. Description: Identify what stages of grief have been experienced in the continuum of the grieving process. Target Date: 2021-07-14 Frequency: Daily Modality: individual Progress: 100%-completed  Related Problem: Complete the process of letting go of the lost significant other. Description: Participate in a therapy that addresses issues beyond grief that have arisen as a result of the loss. Target Date: 2022-01-17 Frequency: Daily Modality: individual Progress: 80%  Related Problem: Complete the process of letting go of the lost significant other. Description: Tell in  detail the story of the current loss that is triggering symptoms. Target Date: 2021-07-14 Frequency: Daily Modality: individual Progress: 100%-completed  Client Response full compliance  Service Location Location, [address] [place], [place] [address]  Service Code cpt [phone]  Validate/empathize  Related past to present  Behavioral activation plan  Facilitate problem solving  Identify automatic thoughts  Rationally challenge thoughts or beliefs/cognitive restructuring  Identify/label emotions  Emotion regulation skills  Psychiatrist  Self care activities  Self-monitoring  Session Notes: Patient requests a video Webex session due to the coronavirus. She is at home and I am at my home office.  Dx: Major Depression  Meds: Lexapro ('20mg'$ ), Busperone (7.5)  Goals:  She reports feeling tired all the time and that she has no energy or motivation. Needs to develop coping strategies. Therapy to focus on reducing despair, sadness, hopelessness and helplessness. 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 Will explore those early traumas/losses and current triggers. Goal dates: 12-2021 Revised Goals: Patient is doing much better with her grief/mourning process. She now needs help managing her reactions to her dysfunctional FOO. In addition, her life is out of control and she need help to focus and establish a plan. Continues to feel depressed and overwhelmed. Goal date is 12-23  [name] states that she is getting a restraining order from the guy she was seeing. He has stalked her and not complying with her request to stay away. He has also been stealing things from her. She is expressing good self care behaviors and improving in terms of asserting herself. She talked about her family not trusting her because of her past drug history. She maintains that in spite of her being clean from substances, they continue to be suspicious. This  is in part related to her association with a woman who is involved with drugs. She is very upset with family and says they are very unsupportive. She cries as she feels that she is being treated so poorly and it is depleting her self esteem. She is is convinced she must be a "bad person" to be treated so poorly. They always" think the worst" of her and have no faith in her. This is how she has felt her whole life. They do not believe her and assume the worst of her. We talked about her "pulling back" from her parents to get a break and use the time to emotionally regroup. She is more fragile right now and overly reactive to their comments toward her. Needs to establish her own goals and focus. She will start to keep a journal of her goals and focus for the coming weeks.              [name], PhD Time: 8:40a-9:30a 50 minutes

## 2021-06-17 ENCOUNTER — Ambulatory Visit

## 2021-06-17 ENCOUNTER — Telehealth: Payer: Self-pay | Admitting: *Deleted

## 2021-06-17 NOTE — Telephone Encounter (Signed)
"  I was supposed to have a MRI today.  I received a phone call late evening stating they were canceling my appointment because no one had checked with my insurance company.  I definitely need this MRI.  I been waiting and waiting and waiting.  Call me back."

## 2021-06-18 NOTE — Telephone Encounter (Signed)
I attempted to call the patient.  I left her a voicemail asking her to call me back.  I need to verify what insurance she has.  All I see is [employer].

## 2021-06-18 NOTE — Telephone Encounter (Signed)
Pt called stating she has not heard anything about the mri authorization.. Please call pt with up. She was to have mri earlier this week but it was canceled due to no authorization. Pt states she is in a lot of pain and is worried about infection and amputation.

## 2021-06-19 NOTE — Telephone Encounter (Signed)
Pt called back stating that she has [employer] and [employer]. Both policies verified in her chart as active.   She is also calling regarding her MRI.  Please advise.

## 2021-06-19 NOTE — Telephone Encounter (Signed)
I called [employer] and spoke to [facility].  The MRI was authorized.  The authorization number is [id number].  It is valid for 30 days from today.  The reference number for the call is [id number].  I called and informed [name] and asked her to call [facility] at [phone] to schedule her appointment.

## 2021-06-22 ENCOUNTER — Ambulatory Visit: Payer: Medicare PPO | Admitting: Psychology

## 2021-07-03 ENCOUNTER — Ambulatory Visit
Admission: RE | Admit: 2021-07-03 | Discharge: 2021-07-03 | Disposition: A | Discharge status: Home / Self Care | Payer: Medicare PPO | Source: Ambulatory Visit | Attending: Podiatry | Admitting: Podiatry

## 2021-07-03 DIAGNOSIS — L97519 Non-pressure chronic ulcer of other part of right foot with unspecified severity: Secondary | ICD-10-CM | POA: Diagnosis not present

## 2021-07-03 DIAGNOSIS — M7989 Other specified soft tissue disorders: Secondary | ICD-10-CM | POA: Diagnosis not present

## 2021-07-03 DIAGNOSIS — S92321A Displaced fracture of second metatarsal bone, right foot, initial encounter for closed fracture: Secondary | ICD-10-CM | POA: Diagnosis not present

## 2021-07-03 DIAGNOSIS — L97512 Non-pressure chronic ulcer of other part of right foot with fat layer exposed: Secondary | ICD-10-CM | POA: Diagnosis not present

## 2021-07-06 ENCOUNTER — Ambulatory Visit (INDEPENDENT_AMBULATORY_CARE_PROVIDER_SITE_OTHER): Payer: Medicare PPO | Admitting: Psychology

## 2021-07-06 DIAGNOSIS — F331 Major depressive disorder, recurrent, moderate: Secondary | ICD-10-CM

## 2021-07-06 NOTE — Progress Notes (Signed)
[name] is a 46 y.o. female patient .  Diagnosis 296.30 (Major depressive affective disorder, recurrent episode, unspecified) [n/a]  Symptoms Depressed or irritable mood. (Status: maintained) -- No Description Entered  Diminished interest in or enjoyment of activities. (Status: maintained) -- No Description Entered  Feelings of hopelessness, worthlessness, or inappropriate guilt. (Status: maintained) -- No Description Entered  Lack of energy. (Status: maintained) -- No Description Entered  Unresolved grief issues. (Status: maintained) -- No Description Entered  Medication Status compliance  Safety none  If Suicidal or Homicidal State Action Taken: unspecified  Current Risk: low Medications Hydroxyzine (Dosage: unknown)  Remiron (Dosage: unknown)  Sertraline (Dosage: unknown)  Objectives Related Problem: Appropriately grieve the loss in order to normalize mood and to return to previously adaptive level of functioning. Description: Increasingly verbalize hopeful and positive statements regarding self, others, and the future. Target Date: 2021-07-14 Frequency: Daily Modality: individual Progress: 60%  Related Problem: Appropriately grieve the loss in order to normalize mood and to return to previously adaptive level of functioning. Description: Learn and implement conflict resolution skills to resolve interpersonal problems. Target Date: 2021-07-14 Frequency: Daily Modality: individual Progress: 70%  Related Problem: Appropriately grieve the loss in order to normalize mood and to return to previously adaptive level of functioning. Description: Learn and implement problem-solving and decision-making skills. Target Date: 2021-07-14 Frequency: Daily Modality: individual Progress: 60%  Related Problem: Appropriately grieve the loss in order to normalize mood and to return to previously adaptive level of functioning. Description:  Describe current and past experiences with depression including their impact on functioning and attempts to resolve it. Target Date: 2021-07-14 Frequency: Daily Modality: individual Progress: 70%  Related Problem: Complete the process of letting go of the lost significant other. Description: Report decreased time spent each day focusing on the loss. Target Date: 2021-07-14 Frequency: Daily Modality: individual Progress: 80%  Related Problem: Complete the process of letting go of the lost significant other. Description: Decrease unrealistic thoughts, statements, and feelings of being responsible for the loss. Target Date: 2021-07-14 Frequency: Daily Modality: individual Progress: 70%  Related Problem: Complete the process of letting go of the lost significant other. Description: Verbalize and resolve feelings of anger or guilt focused on self or deceased loved one that interfere with the grieving process. Target Date: 2021-07-14 Frequency: Daily Modality: individual Progress: 80%  Related Problem: Complete the process of letting go of the lost significant other. Description: Begin verbalizing feelings associated with the loss. Target Date: 2021-07-14 Frequency: Daily Modality: individual Progress: 80%  Related Problem: Complete the process of letting go of the lost significant other. Description: Identify what stages of grief have been experienced in the continuum of the grieving process. Target Date: 2021-07-14 Frequency: Daily Modality: individual Progress: 80%  Related Problem: Complete the process of letting go of the lost significant other. Description: Participate in a therapy that addresses issues beyond grief that have arisen as a result of the loss. Target Date: 2021-07-14 Frequency: Daily Modality: individual Progress: 70%  Related Problem: Complete the process of letting go of the lost significant other. Description: Tell in detail the story of the current  loss that is triggering symptoms. Target Date: 2021-07-14 Frequency: Daily Modality: individual Progress: 100%  Client Response full compliance  Service Location Location, [address] [place], [place] [address]  Service Code cpt [phone]  Validate/empathize  Related past to present  Behavioral activation plan  Facilitate problem solving  Identify automatic thoughts  Rationally challenge thoughts or beliefs/cognitive restructuring  Identify/label emotions  Emotion regulation skills  Psychiatrist  Self care activities  Self-monitoring  Session Notes: Patient requests a video Webex session due to the coronavirus. She is at home and I am at my home office.  Dx: Major Depression  Meds: Lexapro ('20mg'$ ), Busperone (7.5)  Goals: States that she is seeking counseling to learn to manage the grief reaction to losing her husband. She reports feeling tired all the time and that she has no energy or motivation. Needs to develop coping strategies. Therapy to focus on reducing despair, sadness, hopelessness and helplessness. 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 Will explore those early traumas/losses and current triggers. Goal dates: 04-2021 Revised Goals: Patient is doing much better with her grief/mourning process. She now needs help managing her reactions to her dysfunctional FOO. In addition, her life is out of control and she need help to focus and establish a plan. Continues to feel depressed and overwhelmed. Goal date is 6-23  [name] says that she went to her parent's for fathers day. He started to talk to her about politics and it got testy. She also says that her parents do not treat her as an adult or trust that she makes good decisions. It is continually upsetting to her. [name] is much better about asserting herself with her parents. The disparity of how they treat her brother adds to her frustration. The hypocrisy in their  family is extreme, yet she feels that she she still needs to maintain a relationship. We talked about her ability to remain assertive and hold to her values and sense of self regardless of the pressure placed on her by her family. She expresses more confidence in self than in in past.            [name], PhD Time: 7:40a-8:30a 50 minutes

## 2021-07-13 ENCOUNTER — Telehealth: Payer: Self-pay | Admitting: Podiatry

## 2021-07-14 ENCOUNTER — Telehealth: Payer: Self-pay | Admitting: Podiatry

## 2021-07-14 ENCOUNTER — Telehealth: Payer: Self-pay | Admitting: *Deleted

## 2021-07-17 NOTE — Telephone Encounter (Signed)
Ms. Latini was scheduled for an appointment on 07/22/21 at 9:15 am.

## 2021-07-20 ENCOUNTER — Ambulatory Visit (INDEPENDENT_AMBULATORY_CARE_PROVIDER_SITE_OTHER): Payer: Medicare PPO | Admitting: Psychology

## 2021-07-20 DIAGNOSIS — F331 Major depressive disorder, recurrent, moderate: Secondary | ICD-10-CM

## 2021-07-20 NOTE — Progress Notes (Signed)
[name] is a 46 y.o. female patient .  Diagnosis 296.30 (Major depressive affective disorder, recurrent episode, unspecified) [n/a]  Symptoms Depressed or irritable mood. (Status: maintained) -- No Description Entered  Diminished interest in or enjoyment of activities. (Status: maintained) -- No Description Entered  Feelings of hopelessness, worthlessness, or inappropriate guilt. (Status: maintained) -- No Description Entered  Lack of energy. (Status: maintained) -- No Description Entered  Unresolved grief issues. (Status: maintained) -- No Description Entered  Medication Status compliance  Safety none  If Suicidal or Homicidal State Action Taken: unspecified  Current Risk: low Medications Hydroxyzine (Dosage: unknown)  Remiron (Dosage: unknown)  Sertraline (Dosage: unknown)  Objectives Related Problem: Appropriately grieve the loss in order to normalize mood and to return to previously adaptive level of functioning. Description: Increasingly verbalize hopeful and positive statements regarding self, others, and the future. Target Date: 2022-01-13 Frequency: Daily Modality: individual Progress: 80%  Related Problem: Appropriately grieve the loss in order to normalize mood and to return to previously adaptive level of functioning. Description: Learn and implement conflict resolution skills to resolve interpersonal problems. Target Date: 2022-01-13 Frequency: Daily Modality: individual Progress: 80%  Related Problem: Appropriately grieve the loss in order to normalize mood and to return to previously adaptive level of functioning. Description: Learn and implement problem-solving and decision-making skills. Target Date: 2022-01-13 Frequency: Daily Modality: individual Progress: 80%  Related Problem: Appropriately grieve the loss in order to normalize mood and to return to previously adaptive level of functioning. Description: Describe current and past  experiences with depression including their impact on functioning and attempts to resolve it. Target Date: 2021-07-14 Frequency: Daily Modality: individual Progress: 100%-completed  Related Problem: Complete the process of letting go of the lost significant other. Description: Report decreased time spent each day focusing on the loss. Target Date: 2022-01-13 Frequency: Daily Modality: individual Progress: 80%  Related Problem: Complete the process of letting go of the lost significant other. Description: Decrease unrealistic thoughts, statements, and feelings of being responsible for the loss. Target Date: 2021-07-14 Frequency: Daily Modality: individual Progress: 100%-completed  Related Problem: Complete the process of letting go of the lost significant other. Description: Verbalize and resolve feelings of anger or guilt focused on self or deceased loved one that interfere with the grieving process. Target Date: 2021-07-14 Frequency: Daily Modality: individual Progress: 100%-completed  Related Problem: Complete the process of letting go of the lost significant other. Description: Begin verbalizing feelings associated with the loss. Target Date: 2022-01-13 Frequency: Daily Modality: individual Progress: 80%  Related Problem: Complete the process of letting go of the lost significant other. Description: Identify what stages of grief have been experienced in the continuum of the grieving process. Target Date: 2021-07-14 Frequency: Daily Modality: individual Progress: 100%-completed  Related Problem: Complete the process of letting go of the lost significant other. Description: Participate in a therapy that addresses issues beyond grief that have arisen as a result of the loss. Target Date: 2022-01-13 Frequency: Daily Modality: individual Progress: 80%  Related Problem: Complete the process of letting go of the lost significant other. Description: Tell in detail the story  of the current loss that is triggering symptoms. Target Date: 2021-07-14 Frequency: Daily Modality: individual Progress: 100%-completed  Client Response full compliance  Service Location Location, [address] [place], [place] [address]  Service Code cpt [phone]  Validate/empathize  Related past to present  Behavioral activation plan  Facilitate problem solving  Identify automatic thoughts  Rationally  challenge thoughts or beliefs/cognitive restructuring  Identify/label emotions  Emotion regulation skills  Psychiatrist  Self care activities  Self-monitoring  Session Notes: Patient requests a video Webex session due to the coronavirus. She is at home and I am at my home office.  Dx: Major Depression  Meds: Lexapro ('20mg'$ ), Busperone (7.5)  Goals: States that she is seeking counseling to learn to manage the grief reaction to losing her husband. She reports feeling tired all the time and that she has no energy or motivation. Needs to develop coping strategies. Therapy to focus on reducing despair, sadness, hopelessness and helplessness. 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 Will explore those early traumas/losses and current triggers. Goal dates: 04-2021 Revised Goals: Patient is doing much better with her grief/mourning process. She now needs help managing her reactions to her dysfunctional FOO. In addition, her life is out of control and she need help to focus and establish a plan. Continues to feel depressed and overwhelmed. Goal date is 12-23  [name] talked about continued concerns of her FOO. She is getting better about asserting herself and not letting herself get set up for disappointment and rejection. Far better at establishing and maintaining boundaries. Reports feeling proud at herself for asserting her needs/feelings. Moods have been stable and she does not report any significant experiences of grief or  depression. She says that July 5th is her anniversary and she is determined to "make it a good day". She has struggled with this date in the past, but feels that she has "turned a corner".                Kelly Morel, PhD Time: 7:40a-8:30a 50 minutes

## 2021-07-22 ENCOUNTER — Ambulatory Visit: Payer: Medicare PPO | Admitting: Podiatry

## 2021-07-22 ENCOUNTER — Telehealth: Payer: Self-pay | Admitting: Podiatry

## 2021-07-22 NOTE — Telephone Encounter (Signed)
Pt states she received a phone call from Elmyra Ricks C to reschedule appt today from Dr Milinda Pointer to see Dr Amalia Hailey for MRI results. Pt states her wound is bleeding and needed to be seen today. Her concern is that her foot could be cut off. I was advised by Ernesto Rutherford that Dr Milinda Pointer would treat her wound but will not go over MRI results, she would need to follow up with Dr Amalia Hailey. Pt was upset and wanted to be seen and know what her results are. She cancelled appt today with Dr Milinda Pointer, states she only wants one doctor treating her foot and wants to see Dr Amalia Hailey. She will speak with her daughter who is a nurse to advise her what to do. She would like to know if Dr Amalia Hailey can call her and give the results over the phone.   Please advise

## 2021-07-23 NOTE — Telephone Encounter (Signed)
I haven't seen this patient since December. She really needs to come in for re-evaluation and go over MRI results in person. Please have her come in for appt. - Dr. Amalia Hailey

## 2021-07-29 ENCOUNTER — Ambulatory Visit: Admitting: Podiatry

## 2021-07-31 ENCOUNTER — Other Ambulatory Visit: Payer: Self-pay | Admitting: Family Medicine

## 2021-07-31 DIAGNOSIS — F419 Anxiety disorder, unspecified: Secondary | ICD-10-CM

## 2021-08-03 ENCOUNTER — Ambulatory Visit (INDEPENDENT_AMBULATORY_CARE_PROVIDER_SITE_OTHER): Payer: Medicare PPO | Admitting: Psychology

## 2021-08-03 DIAGNOSIS — F331 Major depressive disorder, recurrent, moderate: Secondary | ICD-10-CM | POA: Diagnosis not present

## 2021-08-03 NOTE — Progress Notes (Signed)
[name] is a 46 y.o. female patient .  Diagnosis 296.30 (Major depressive affective disorder, recurrent episode, unspecified) [n/a]  Symptoms Depressed or irritable mood. (Status: maintained) -- No Description Entered  Diminished interest in or enjoyment of activities. (Status: maintained) -- No Description Entered  Feelings of hopelessness, worthlessness, or inappropriate guilt. (Status: maintained) -- No Description Entered  Lack of energy. (Status: maintained) -- No Description Entered  Unresolved grief issues. (Status: maintained) -- No Description Entered  Medication Status compliance  Safety none  If Suicidal or Homicidal State Action Taken: unspecified  Current Risk: low Medications Hydroxyzine (Dosage: unknown)  Remiron (Dosage: unknown)  Sertraline (Dosage: unknown)  Objectives Related Problem: Appropriately grieve the loss in order to normalize mood and to return to previously adaptive level of functioning. Description: Increasingly verbalize hopeful and positive statements regarding self, others, and the future. Target Date: 2022-01-13 Frequency: Daily Modality: individual Progress: 80%  Related Problem: Appropriately grieve the loss in order to normalize mood and to return to previously adaptive level of functioning. Description: Learn and implement conflict resolution skills to resolve interpersonal problems. Target Date: 2022-01-13 Frequency: Daily Modality: individual Progress: 80%  Related Problem: Appropriately grieve the loss in order to normalize mood and to return to previously adaptive level of functioning. Description: Learn and implement problem-solving and decision-making skills. Target Date: 2022-01-13 Frequency: Daily Modality: individual Progress: 80%  Related Problem: Appropriately grieve the loss in order to normalize mood and to return to previously adaptive level of functioning. Description: Describe  current and past experiences with depression including their impact on functioning and attempts to resolve it. Target Date: 2021-07-14 Frequency: Daily Modality: individual Progress: 100%-completed  Related Problem: Complete the process of letting go of the lost significant other. Description: Report decreased time spent each day focusing on the loss. Target Date: 2022-01-13 Frequency: Daily Modality: individual Progress: 80%  Related Problem: Complete the process of letting go of the lost significant other. Description: Decrease unrealistic thoughts, statements, and feelings of being responsible for the loss. Target Date: 2021-07-14 Frequency: Daily Modality: individual Progress: 100%-completed  Related Problem: Complete the process of letting go of the lost significant other. Description: Verbalize and resolve feelings of anger or guilt focused on self or deceased loved one that interfere with the grieving process. Target Date: 2021-07-14 Frequency: Daily Modality: individual Progress: 100%-completed  Related Problem: Complete the process of letting go of the lost significant other. Description: Begin verbalizing feelings associated with the loss. Target Date: 2022-01-13 Frequency: Daily Modality: individual Progress: 80%  Related Problem: Complete the process of letting go of the lost significant other. Description: Identify what stages of grief have been experienced in the continuum of the grieving process. Target Date: 2021-07-14 Frequency: Daily Modality: individual Progress: 100%-completed  Related Problem: Complete the process of letting go of the lost significant other. Description: Participate in a therapy that addresses issues beyond grief that have arisen as a result of the loss. Target Date: 2022-01-13 Frequency: Daily Modality: individual Progress: 80%  Related Problem: Complete the process of letting go of the lost significant other. Description: Tell in  detail the story of the current loss that is triggering symptoms. Target Date: 2021-07-14 Frequency: Daily Modality: individual Progress: 100%-completed  Client Response full compliance  Service Location Location, [address] [place], [place] [address]  Service Code cpt [phone]  Validate/empathize  Related past to present  Behavioral activation plan  Facilitate problem  solving  Identify automatic thoughts  Rationally challenge thoughts or beliefs/cognitive restructuring  Identify/label emotions  Emotion regulation skills  Psychiatrist  Self care activities  Self-monitoring  Session Notes: Patient requests a video Webex session due to the coronavirus. She is at home and I am at my home office.  Dx: Major Depression  Meds: Lexapro ('20mg'$ ), Busperone (7.5)  Goals: States that she is seeking counseling to learn to manage the grief reaction to losing her husband. She reports feeling tired all the time and that she has no energy or motivation. Needs to develop coping strategies. Therapy to focus on reducing despair, sadness, hopelessness and helplessness. 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 Will explore those early traumas/losses and current triggers. Goal dates: 04-2021 Revised Goals: Patient is doing much better with her grief/mourning process. She now needs help managing her reactions to her dysfunctional FOO. In addition, her life is out of control and she need help to focus and establish a plan. Continues to feel depressed and overwhelmed. 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 She is sad, but glad she had the experience of getting to know her. [name] says that she has gotten the paperwork from [employer] experience and she wants to go back to school. She is going to explore different degrees and will talk with [employer] to discuss  options. She talked about her pride of daughter, who works for a [place] office and has recently lost 49 pounds. [name] feels that raising her daughter was one of the best success' of her life.  She says that she tolerated her anniversary (July 5) well. Says she feels more grounded than before and was able to get through the day without significant distress.                [name], PhD Time: 7:40a-8:30a 50 minutes

## 2021-08-04 ENCOUNTER — Ambulatory Visit (INDEPENDENT_AMBULATORY_CARE_PROVIDER_SITE_OTHER): Payer: Medicare PPO | Admitting: Podiatry

## 2021-08-04 DIAGNOSIS — L97512 Non-pressure chronic ulcer of other part of right foot with fat layer exposed: Secondary | ICD-10-CM | POA: Diagnosis not present

## 2021-08-04 MED ORDER — DOXYCYCLINE HYCLATE 100 MG PO TABS: 100.0000 mg | ORAL_TABLET | Freq: Two times a day (BID) | ORAL | 0 refills | Status: DC

## 2021-08-04 MED ORDER — GENTAMICIN SULFATE 0.1 % EX CREA: 1.0000 | TOPICAL_CREAM | Freq: Two times a day (BID) | CUTANEOUS | 1 refills | Status: DC

## 2021-08-04 MED ORDER — LIDOCAINE-PRILOCAINE 2.5-2.5 % EX CREA: 1.0000 | TOPICAL_CREAM | CUTANEOUS | 1 refills | Status: DC | PRN
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
## 2021-08-05 ENCOUNTER — Ambulatory Visit: Admitting: Podiatry

## 2021-08-07 LAB — WOUND CULTURE

## 2021-08-07 LAB — SPECIMEN STATUS REPORT

## 2021-08-17 ENCOUNTER — Ambulatory Visit: Payer: Medicare PPO | Admitting: Psychology

## 2021-08-20 ENCOUNTER — Encounter: Payer: Medicare PPO | Attending: Physician Assistant | Admitting: Physician Assistant

## 2021-08-20 DIAGNOSIS — M86671 Other chronic osteomyelitis, right ankle and foot: Secondary | ICD-10-CM | POA: Diagnosis not present

## 2021-08-20 DIAGNOSIS — G7100 Muscular dystrophy, unspecified: Secondary | ICD-10-CM | POA: Diagnosis not present

## 2021-08-20 DIAGNOSIS — L97512 Non-pressure chronic ulcer of other part of right foot with fat layer exposed: Secondary | ICD-10-CM | POA: Diagnosis not present

## 2021-08-20 DIAGNOSIS — L84 Corns and callosities: Secondary | ICD-10-CM | POA: Diagnosis not present

## 2021-08-20 DIAGNOSIS — G600 Hereditary motor and sensory neuropathy: Secondary | ICD-10-CM | POA: Insufficient documentation

## 2021-08-24 NOTE — Progress Notes (Signed)
BELENDA, ALVIAR ([id number]) Visit Report for 08/20/2021 Chief Complaint Document Details Patient Name: [name], [name]. Date of Service: 08/20/2021 8:45 AM Medical Record Number: [id number] Patient Account Number: [id number] Date of Birth/Sex: 03/09/1975 (46 y.o. F) Treating RN: [name] Primary Care Provider: [name] Other Clinician: Referring Provider: Daylene Katayama Treating Provider/Extender: [name] in Treatment: 0 Information Obtained from: Patient Chief Complaint Right [name] ulcer Electronic Signature(s) Signed: 08/20/2021 9:18:29 AM By: [name] Entered By: Worthy Keeler on 08/20/2021 09:18:29 [name], [name] ([id number]) -------------------------------------------------------------------------------- Debridement Details Patient Name: [name]. Date of Service: 08/20/2021 8:45 AM Medical Record Number: [id number] Patient Account Number: [id number] Date of Birth/Sex: December 01, 1975 (46 y.o. F) Treating RN: [name] Primary Care Provider: [name] Other Clinician: Referring Provider: Daylene Katayama Treating Provider/Extender: [name] in Treatment: 0 Debridement Performed for Wound #2 Posterior [name] Assessment: Performed By: Physician [name], [name] Debridement Type: Debridement Level of Consciousness (Pre- Awake and Alert procedure): Pre-procedure Verification/Time Out Yes - 09:25 Taken: Start Time: 08:25 Pain Control: Lidocaine 4% Topical Solution Total Area Debrided (L x W): 1.3 (cm) x 1.3 (cm) = 1.69 (cm) Tissue and other material Viable, Non-Viable, Callus, Slough, Subcutaneous, Skin: Dermis , Skin: Epidermis, Slough debrided: Level: Skin/Subcutaneous Tissue Debridement Description: Excisional Instrument: Curette Bleeding: Minimum Hemostasis Achieved: Pressure End Time: 09:29 Procedural Pain: 0 Post Procedural Pain: 0 Response to Treatment: Procedure was tolerated well Level of Consciousness  (Post- Awake and Alert procedure): Post Debridement Measurements of Total Wound Length: (cm) 1 Width: (cm) 0.9 Depth: (cm) 0.4 Volume: (cm) 0.283 Character of Wound/Ulcer Post Debridement: Improved Post Procedure Diagnosis Same as Pre-procedure Electronic Signature(s) Signed: 08/20/2021 6:41:29 PM By: [name] Signed: 08/24/2021 8:34:39 AM By: [name] RN Entered By: [name] on 08/20/2021 09:29:06 [name], Kelly M. ([id number]) -------------------------------------------------------------------------------- HPI Details Patient Name: [name]. Date of Service: 08/20/2021 8:45 AM Medical Record Number: [id number] Patient Account Number: [id number] Date of Birth/Sex: 07-21-1975 (46 y.o. F) Treating RN: [name] Primary Care Provider: [name] Other Clinician: Referring Provider: Daylene Katayama Treating Provider/Extender: [name] in Treatment: 0 History of Present Illness Location: right first metatarsal head plantar aspect Quality: Patient reports experiencing a dull pain to affected area(s). Severity: Patient states wound are getting worse. Duration: Patient has had the wound for > 2 months prior to seeking treatment at the wound center Timing: Pain in wound is Intermittent (comes and goes Context: The wound appeared gradually over time Modifying Factors: Other treatment(s) tried include:pain clinic for her narcotics Associated Signs and Symptoms: Patient reports having difficulty standing for long periods. HPI Description: 46 year old patient sent to [name] by her PCP 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 Her notes were reviewed and there was extensive history of opioid treatment and management by the [facility] pain clinic from where she has been terminated. X-ray of the  right [name] [name] was done on 03/13/2015 and was suggestive of active infection and possible osteomyelitis. her past surgical history includes amputation of the left big toe in 2011 and amputation of the right big toe in 2015. She was sitting the wound clinic at [facility] and we will try and obtain these notes. She is a smoker and smokes about 15 cigarettes a day. 06/12/2015 - x-ray of the right [name] -- IMPRESSION:1.  Soft tissue swelling of the plantar surface of the [name]. No underlying acute abnormality. 2. Amputation deformity again of the right great toe. Old healed bony deformity noted the right second metatarsal. No acute bony abnormality identified. We have not received any reports from the [facility] hospital yet. She continues to smoke at least 10 cigarettes a day 06/19/2015 -- she has increasing pain in the right [name] and also has had some low-grade fever. Her vascular test is scheduled for tomorrow and her MRIs not till next week. Readmission: 08-20-2021 upon evaluation today patient presents for initial inspection here in our clinic although she has been seen that was back in 2017. At that time she was having a issue with her right [name]. With that being said upon inspection today she is actually having issues with the right [name] as well on the medial aspect where she has some breakdown due to what I am assuming is pressure to the [name] location. She does have Charcot-Marie- Tooth disease she also has a history of muscular dystrophy, callus buildup, and osteomyelitis of the ankle and [name] which has been demonstrated at this point as well to be present currently. I did review her culture report which showed evidence of bacteria including hemolytic Streptococcus group A as well as Staphylococcus aureus. The patient is currently on doxycycline and tells me that this is getting much better. Topical gentamicin is also being utilized. Also did review her arterial studies which show that she has on the  right and ABI of 1.15 which is triphasic and appears to be excellent there is no significant peripheral artery disease noted the left is also. With ABI of 1.11 and a good report in that regard as well. Lastly I did also review her MRI of the right forefoot which was performed on 07-06-2021. This shows that she does have mild subcortical marrow edema in the stump of the first metatarsal concerning for osteomyelitis. There is also a malunited fracture of the second metatarsal. The patient has bone marrow edema in the base of the second third fourth and fifth metatarsals concerning for stress reaction obviously this infection is going to be an issue for [name] here as well she needs to be treated appropriately and I do believe that the doxycycline is a good option right now although I may need to #1 extend this and #2 plan for even altering or adding to the treatment regimen depending on how things progress. Patient voiced understanding.. Electronic Signature(s) Signed: 08/20/2021 5:46:14 PM By: [name] Entered By: Worthy Keeler on 08/20/2021 17:46:14 [name], [name] ([id number]) -------------------------------------------------------------------------------- Physical Exam Details Patient Name: [name]. Date of Service: 08/20/2021 8:45 AM Medical Record Number: [id number] Patient Account Number: [id number] Date of Birth/Sex: 1975/10/16 (46 y.o. F) Treating RN: [name] Primary Care Provider: [name] Other Clinician: Referring Provider: Daylene Katayama Treating Provider/Extender: [name] in Treatment: 0 Constitutional sitting or standing blood pressure is within target range for patient.. supine blood pressure is within target range for patient.. pulse regular and within target range for patient.Marland Kitchen respirations regular, non-labored and within target range for patient.Marland Kitchen temperature within target range for patient.. Well-nourished and well-hydrated in no acute  distress. Eyes conjunctiva clear no eyelid edema noted. pupils equal round and reactive to light and accommodation. Ears, Nose, Mouth, and Throat no gross abnormality of ear auricles or external auditory canals. normal hearing noted during conversation. mucus membranes moist. Respiratory normal breathing without difficulty. Cardiovascular 1+ dorsalis pedis/posterior tibialis pulses.  no clubbing, cyanosis, significant edema, <3 sec cap refill. Musculoskeletal normal gait and posture. no significant deformity or arthritic changes, no loss or range of motion, no clubbing. Psychiatric this patient is able to make decisions and demonstrates good insight into disease process. Alert and Oriented x 3. pleasant and cooperative. Notes Upon inspection patient's wound bed actually showed signs of good granulation and epithelization at this point. Fortunately there does not appear to be any evidence of active infection systemically though locally there is definite signs of infection here. I do believe she also has a lot of callus buildup the skin need to be cleared away and I discussed that with her today she is good with me performing debridement and I told her I would be very careful in doing this. She voiced understanding. I was able to remove the majority of the callus and a little bit of the slough and biofilm on the surface of the wound. With that being said as I got more to the wound surface she was having issues with starting to feel lightheaded I think she was hyperventilating a little bit for that reason we did avoid any additional debridement at this point. Electronic Signature(s) Signed: 08/20/2021 5:47:10 PM By: [name] Entered By: Worthy Keeler on 08/20/2021 17:47:10 [name], [name] ([id number]) -------------------------------------------------------------------------------- Physician Orders Details Patient Name: [name]. Date of Service: 08/20/2021 8:45 AM Medical  Record Number: [id number] Patient Account Number: [id number] Date of Birth/Sex: 1975-12-31 (46 y.o. 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 Bathing/ Shower/ Hygiene o May shower with wound dressing protected with water repellent cover or cast protector. o No tub bath. Edema Control - Lymphedema / Segmental Compressive Device / Other o Elevate, Exercise Daily and Avoid Standing for Long Periods of Time. o Elevate legs to the level of the heart and pump ankles as often as possible o Elevate leg(s) parallel to the floor when sitting. Wound Treatment Wound #2 - [name] Wound Laterality: Posterior Cleanser: Byram Ancillary Kit - 15 Day Supply (DME) (Generic) 3 x Per Day/30 Days Discharge Instructions: Use supplies as instructed; Kit contains: (15) Saline Bullets; (15) 3x3 Gauze; 15 pr Gloves Cleanser: Soap and Water 3 x Per Day/30 Days Discharge Instructions: Gently cleanse wound with antibacterial soap, rinse and pat dry prior to dressing wounds Topical: Gentamicin 3 x Per Day/30 Days Discharge Instructions: Apply as directed by provider. Primary Dressing: Hydrofera Blue Ready Transfer Foam, 4x5 (in/in) (Generic) 3 x Per Day/30 Days Discharge Instructions: Apply Hydrofera Blue Ready to wound bed as directed Secondary Dressing: (SILICONE BORDER) Zetuvit Plus SILICONE BORDER Dressing 4x4 (in/in) (DME) (Generic) 3 x Per Day/30 Days Discharge Instructions: Please do  not put silicone bordered dressings under wraps. Use non-bordered dressing only. Patient Medications Allergies: penicillin, Bactrim, Sulfa (Sulfonamide Antibiotics), tramadol HCl, clindamycin, bee venom protein (honey bee) Notifications Medication Indication Start End doxycycline hyclate 08/20/2021 DOSE 1 - oral 100 mg capsule - 1 capsule oral taken 2 times per day for 30 days. Electronic Signature(s) Signed: 08/20/2021 5:48:15 PM By: [name] Entered By: Joaquim Lai  III, Ajiah Mcglinn on 08/20/2021 17:48:14 [name], Kelly M. ([id number]) [name], [name] ([id number]) -------------------------------------------------------------------------------- Problem List Details Patient Name: [name]. Date of Service: 08/20/2021 8:45 AM Medical Record Number: [id number] Patient Account Number: [id number] Date of Birth/Sex: 07/16/1975 (46 y.o. 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 ([id number]) -------------------------------------------------------------------------------- Progress Note Details Patient Name: [name]. Date of Service: 08/20/2021 8:45 AM Medical Record Number: [id number] Patient Account Number: [id number] Date of Birth/Sex: 06/06/1975 (46  y.o. F) Treating RN: [name] Primary Care Provider: [name] Other Clinician: Referring Provider: Daylene Katayama Treating Provider/Extender: [name] in Treatment: 0 Subjective Chief Complaint Information obtained from Patient Right [name] ulcer History of Present Illness (HPI) The following HPI elements were documented for the patient's wound: Location: right first metatarsal head plantar aspect Quality: Patient reports experiencing a dull pain to affected area(s). Severity: Patient states wound are getting worse. Duration: Patient has had the wound for > 2 months prior to seeking treatment at the wound center Timing: Pain in wound is Intermittent (comes and goes Context: The wound appeared gradually over time Modifying Factors: Other treatment(s) tried include:pain clinic for her narcotics Associated Signs and Symptoms: Patient reports having difficulty standing for long periods. 46 year old patient sent to [name] by her PCP 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 Her notes were reviewed and there was extensive history of opioid treatment and management by the [facility] pain clinic from where she has been terminated. X-ray of the right [name] [name] was done on 03/13/2015 and was suggestive of active infection and possible osteomyelitis. her past surgical history includes amputation of the left big toe in 2011 and amputation of the right big toe in 2015. She was sitting the wound clinic at [facility] and we will try and obtain these notes. She is a smoker and smokes about 15 cigarettes a day. 06/12/2015 - x-ray of the right [name] -- IMPRESSION:1. Soft tissue swelling of the plantar surface of the [name]. No underlying acute abnormality. 2. Amputation deformity again of the right great toe. Old healed bony  deformity noted the right second metatarsal. No acute bony abnormality identified. We have not received any reports from the [facility] hospital yet. She continues to smoke at least 10 cigarettes a day 06/19/2015 -- she has increasing pain in the right [name] and also has had some low-grade fever. Her vascular test is scheduled for tomorrow and her MRIs not till next week. Readmission: 08-20-2021 upon evaluation today patient presents for initial inspection here in our clinic although she has been seen that was back in 2017. At that time she was having a issue with her right [name]. With that being said upon inspection today she is actually having issues with the right [name]  as well on the medial aspect where she has some breakdown due to what I am assuming is pressure to the [name] location. She does have Charcot-Marie- Tooth disease she also has a history of muscular dystrophy, callus buildup, and osteomyelitis of the ankle and [name] which has been demonstrated at this point as well to be present currently. I did review her culture report which showed evidence of bacteria including hemolytic Streptococcus group A as well as Staphylococcus aureus. The patient is currently on doxycycline and tells me that this is getting much better. Topical gentamicin is also being utilized. Also did review her arterial studies which show that she has on the right and ABI of 1.15 which is triphasic and appears to be excellent there is no significant peripheral artery disease noted the left is also. With ABI of 1.11 and a good report in that regard as well. Lastly I did also review her MRI of the right forefoot which was performed on 07-06-2021. This shows that she does have mild subcortical marrow edema in the stump of the first metatarsal concerning for osteomyelitis. There is also a malunited fracture of the second metatarsal. The patient has bone marrow edema in the base of the second third fourth and fifth metatarsals concerning  for stress reaction obviously this infection is going to be an issue for [name] here as well she needs to be treated appropriately and I do believe that the doxycycline is a good option right now although I may need to #1 extend this and #2 plan for even altering or adding to the treatment regimen depending on how things progress. Patient voiced understanding.. Patient History Information obtained from Patient. Allergies penicillin (Reaction: hives), Bactrim (Reaction: breathing issues), Sulfa (Sulfonamide Antibiotics), tramadol HCl, clindamycin, bee venom protein (honey bee) Family History Diabetes, Hypertension - Father, No family history of Cancer, Heart Disease, Kidney Disease, Lung Disease, Seizures, Stroke, Thyroid Problems, Tuberculosis. Social History Current every day smoker, Marital Status - Married, Alcohol Use - Rarely, Drug Use - No History, Caffeine Use - Daily. [name], [name] ([id number]) Medical History Eyes Denies history of Cataracts, Glaucoma, Optic Neuritis Ear/Nose/Mouth/Throat Denies history of Chronic sinus problems/congestion, Middle ear problems Hematologic/Lymphatic Patient has history of Anemia Denies history of Hemophilia, Human Immunodeficiency Virus, Lymphedema, Sickle Cell Disease Respiratory Patient has history of Asthma Denies history of Chronic Obstructive Pulmonary Disease (COPD), Pneumothorax, Sleep Apnea, Tuberculosis Cardiovascular Denies history of Arrhythmia, Congestive Heart Failure, Coronary Artery Disease, Deep Vein Thrombosis, Hypertension, Hypotension, Myocardial Infarction, Peripheral Arterial Disease, Peripheral Venous Disease, Phlebitis Gastrointestinal Denies history of Cirrhosis , Colitis, Crohn s, Hepatitis A, Hepatitis B, Hepatitis C Endocrine Denies history of Type I Diabetes, Type II Diabetes Genitourinary Denies history of End Stage Renal Disease Immunological Denies history of Lupus Erythematosus, Raynaud s,  Scleroderma Integumentary (Skin) Patient has history of History of pressure wounds Denies history of History of Burn Musculoskeletal Patient has history of Osteomyelitis Denies history of Gout, Rheumatoid Arthritis, Osteoarthritis Neurologic Patient has history of Neuropathy - inflammatory and toxic Denies history of Dementia, Quadriplegia, Paraplegia, Seizure Disorder Oncologic Denies history of Received Chemotherapy, Received Radiation Psychiatric Denies history of Anorexia/bulimia, Confinement Anxiety Medical And Surgical History Notes Constitutional Symptoms (General Health) chronic pain, [name] pain, tooth pain, irregular periods, allergic rhinitis, continuous opioid dependence, long term drug therapy Integumentary (Skin) ulcer of [name] Musculoskeletal muscular dystrophy, acquired deformities of the toe, closed dislocation of trasometatarsal joint, , abscess or cellulitis of [name], Neurologic neuropathic arthritis, neuropathic pain, paraneoplastic neuropahty Psychiatric anxiety, depression  Objective Constitutional sitting or standing blood pressure is within target range for patient.. supine blood pressure is within target range for patient.. pulse regular and within target range for patient.Marland Kitchen respirations regular, non-labored and within target range for patient.Marland Kitchen temperature within target range for patient.. Well-nourished and well-hydrated in no acute distress. Vitals Time Taken: 8:57 AM, Height: 67 in, Source: Stated, Weight: 153 lbs, Source: Stated, BMI: 24, Temperature: 98.7 F, Pulse: 90 bpm, Respiratory Rate: 16 breaths/min, Blood Pressure: 107/72 mmHg. Eyes conjunctiva clear no eyelid edema noted. pupils equal round and reactive to light and accommodation. Ears, Nose, Mouth, and Throat no gross abnormality of ear auricles or external auditory canals. normal hearing noted during conversation. mucus membranes moist. Respiratory normal breathing without  difficulty. [name], GRINDSTAFF. ([id number]) Cardiovascular 1+ dorsalis pedis/posterior tibialis pulses. no clubbing, cyanosis, significant edema, Musculoskeletal normal gait and posture. no significant deformity or arthritic changes, no loss or range of motion, no clubbing. Psychiatric this patient is able to make decisions and demonstrates good insight into disease process. Alert and Oriented x 3. pleasant and cooperative. General Notes: Upon inspection patient's wound bed actually showed signs of good granulation and epithelization at this point. Fortunately there does not appear to be any evidence of active infection systemically though locally there is definite signs of infection here. I do believe she also has a lot of callus buildup the skin need to be cleared away and I discussed that with her today she is good with me performing debridement and I told her I would be very careful in doing this. She voiced understanding. I was able to remove the majority of the callus and a little bit of the slough and biofilm on the surface of the wound. With that being said as I got more to the wound surface she was having issues with starting to feel lightheaded I think she was hyperventilating a little bit for that reason we did avoid any additional debridement at this point. Integumentary (Hair, Skin) Wound #2 status is Open. Original cause of wound was Trauma. The date acquired was: 06/18/2021. The wound is located on the Posterior [name]. The wound measures 1cm length x 0.9cm width x 0.4cm depth; 0.707cm^2 area and 0.283cm^3 volume. There is Fat Layer (Subcutaneous Tissue) exposed. There is no tunneling or undermining noted. There is a medium amount of serosanguineous drainage noted. There is medium (34-66%) red granulation within the wound bed. There is a medium (34-66%) amount of necrotic tissue within the wound bed. 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 There was a Excisional Skin/Subcutaneous Tissue Debridement with a total area of 1.69 sq cm performed by [name], [name]. With the following instrument(s): Curette to remove Viable and Non-Viable tissue/material. Material removed includes Callus, Subcutaneous Tissue, Slough, Skin: Dermis, and Skin: Epidermis after achieving pain control using Lidocaine 4% Topical Solution. No specimens were taken. A time out was conducted at 09:25, prior to the start of the procedure. A Minimum amount of bleeding was controlled with Pressure. The procedure was tolerated well with a pain level of 0 throughout and a pain level of 0 following the procedure. Post Debridement Measurements: 1cm length x 0.9cm width x 0.4cm depth; 0.283cm^3 volume. Character of Wound/Ulcer Post Debridement  is improved. Post procedure Diagnosis Wound #2: Same as Pre-Procedure Plan Follow-up Appointments: Return Appointment in 1 week. Bathing/ Shower/ Hygiene: May shower with wound dressing protected with water repellent cover or cast protector. No tub bath. Edema Control - Lymphedema / Segmental Compressive Device / Other: Elevate, Exercise Daily and Avoid Standing for Long Periods of Time. Elevate legs to the level of the heart and pump ankles as often as possible Elevate leg(s) parallel to the floor when sitting. WOUND #2: - [name] Wound Laterality: Posterior Cleanser: Byram Ancillary Kit - 15 Day Supply (DME) (Generic) 3 x Per Day/30 Days Discharge Instructions: Use supplies as instructed; Kit contains: (15) Saline Bullets; (15) 3x3 Gauze; 15 pr Gloves Cleanser: Soap and Water 3 x Per Day/30 Days Discharge Instructions: Gently cleanse wound  with antibacterial soap, rinse and pat dry prior to dressing wounds Topical: Gentamicin 3 x Per Day/30 Days RMONI, KEPLINGER ([id number]) Discharge Instructions: Apply as directed by provider. Primary Dressing: Hydrofera Blue Ready Transfer Foam, 4x5 (in/in) (Generic) 3 x Per Day/30 Days Discharge Instructions: Apply Hydrofera Blue Ready to wound bed as directed Secondary Dressing: (SILICONE BORDER) Zetuvit Plus SILICONE BORDER Dressing 4x4 (in/in) (DME) (Generic) 3 x Per Day/30 Days Discharge Instructions: Please do not put silicone bordered dressings under wraps. Use non-bordered dressing only. 1. I would recommend currently that we go ahead and initiate a continuation of treatment with the doxycycline which I think is going to be a good way to go here. I do believe that the doxycycline is our best option in fact. She seems to be tolerating that well on [place] give her a refill of that on top of the 1 that I gave her currently should have 1 more refill as well which we may need to have on this for a bit of time. 2. I am also can recommend at this point that we have the patient continue to monitor for any signs of worsening I really think if we can confirm that her infection is under good control that the next best step is going to be to getting the total contact cast we discussed the possibility of a knee scooter but she tells me that she is not going to be practical for her at home she really would not be able to use it regularly. 3. If we do not see continued improvement especially once we get the total contact casting going I believe that we are going to probably need to consider hyperbaric oxygen therapy. The patient voiced understanding. We will see patient back for reevaluation in 1 week here in the clinic. If anything worsens or changes patient will contact our office for additional recommendations. Electronic Signature(s) Signed: 08/20/2021 5:49:46 PM By: [name] Entered By:  Worthy Keeler on 08/20/2021 17:49:46 Hoos, Navneet [name] ([id number]) -------------------------------------------------------------------------------- ROS/PFSH Details Patient Name: [name]. Date of Service: 08/20/2021 8:45 AM Medical Record Number: [id number] Patient Account Number: [id number] Date of Birth/Sex: Apr 09, 1975 (46 y.o. F) Treating RN: [name] Primary Care Provider: [name] Other Clinician: Referring Provider: Daylene Katayama Treating Provider/Extender: [name] in Treatment: 0 Information Obtained From Patient Constitutional Symptoms (General Health) Medical History: Past Medical History Notes: chronic pain, [name] pain, tooth pain, irregular periods, allergic rhinitis, continuous opioid dependence, long term drug therapy Eyes Medical History: Negative for: Cataracts; Glaucoma; Optic Neuritis Ear/Nose/Mouth/Throat Medical History: Negative for: Chronic sinus problems/congestion; Middle ear problems Hematologic/Lymphatic Medical History: Positive for: Anemia Negative for: Hemophilia; Human Immunodeficiency Virus; Lymphedema; Sickle Cell  Disease Respiratory Medical History: Positive for: Asthma Negative for: Chronic Obstructive Pulmonary Disease (COPD); Pneumothorax; Sleep Apnea; Tuberculosis Cardiovascular Medical History: Negative for: Arrhythmia; Congestive Heart Failure; Coronary Artery Disease; Deep Vein Thrombosis; Hypertension; Hypotension; Myocardial Infarction; Peripheral Arterial Disease; Peripheral Venous Disease; Phlebitis Gastrointestinal Medical History: Negative for: Cirrhosis ; Colitis; Crohnos; Hepatitis A; Hepatitis B; Hepatitis C Endocrine Medical History: Negative for: Type I Diabetes; Type II Diabetes Genitourinary Medical History: Negative for: End Stage Renal Disease Immunological Medical History: Negative for: Lupus Erythematosus; Raynaudos; Scleroderma Integumentary (Skin) Lorino, Cola M.  ([id number]) Medical History: Positive for: History of pressure wounds Negative for: History of Burn Past Medical History Notes: ulcer of [name] Musculoskeletal Medical History: Positive for: Osteomyelitis Negative for: Gout; Rheumatoid Arthritis; Osteoarthritis Past Medical History Notes: muscular dystrophy, acquired deformities of the toe, closed dislocation of trasometatarsal joint, , abscess or cellulitis of [name], Neurologic Medical History: Positive for: Neuropathy - inflammatory and toxic Negative for: Dementia; Quadriplegia; Paraplegia; Seizure Disorder Past Medical History Notes: neuropathic arthritis, neuropathic pain, paraneoplastic neuropahty Oncologic Medical History: Negative for: Received Chemotherapy; Received Radiation Psychiatric Medical History: Negative for: Anorexia/bulimia; Confinement Anxiety Past Medical History Notes: anxiety, depression Immunizations Implantable Devices No devices added Family and Social History Cancer: No; Diabetes: Yes; Heart Disease: No; Hypertension: Yes - Father; Kidney Disease: No; Lung Disease: No; Seizures: No; Stroke: No; Thyroid Problems: No; Tuberculosis: No; Current every day smoker; Marital Status - Married; Alcohol Use: Rarely; Drug Use: No History; Caffeine Use: Daily; Financial Concerns: No; Food, Clothing or Shelter Needs: No; Support System Lacking: No; Transportation Concerns: No Electronic Signature(s) Signed: 08/20/2021 6:41:29 PM By: [name] Signed: 08/24/2021 8:34:39 AM By: [name] RN Entered By: [name] on 08/20/2021 08:59:41 Preston, Bayley M. ([id number]) -------------------------------------------------------------------------------- SuperBill Details Patient Name: [name]. Date of Service: 08/20/2021 Medical Record Number: [id number] Patient Account Number: [id number] Date of Birth/Sex: [birth year] (46 y.o. 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 Corns and callosities Facility Procedures CPT4 Code: 22482500 Description: 99213 - WOUND CARE VISIT-LEV 3 EST PT Modifier: Quantity: 1 CPT4 Code: 37048889 Description: 11042 - DEB SUBQ TISSUE 20 SQ CM/< Modifier: Quantity: 1 CPT4 Code: Description: ICD-10 Diagnosis Description L97.512 Non-pressure chronic ulcer of other part of right [name] with fat layer ex Modifier: posed Quantity: Physician Procedures CPT4 Code: 1694503 Description: 88828 - WC PHYS LEVEL 4 - NEW PT Modifier: 25 Quantity: 1 CPT4 Code: Description: ICD-10 Diagnosis Description M86.671 Other chronic osteomyelitis, right ankle and [name] L97.512 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

## 2021-08-24 NOTE — Progress Notes (Signed)
[name], [name] ([id number]) Visit Report for 08/20/2021 Abuse Risk Screen Details Patient Name: [name], [name]. Date of Service: 08/20/2021 8:45 AM Medical Record Number: [id number] Patient Account Number: [id number] Date of Birth/Sex: March 23, 1975 (46 y.o. F) Treating RN: [name] Primary Care Esther Bradstreet: [name] Other Clinician: Referring Leelah Hanna: Daylene Katayama Treating Zailee Vallely/Extender: [name] in Treatment: 0 Abuse Risk Screen Items Answer ABUSE RISK SCREEN: Has anyone close to you tried to hurt or harm you recentlyo No Do you feel uncomfortable with anyone in your familyo No Has anyone forced you do things that you didnot want to doo No Electronic Signature(s) Signed: 08/24/2021 8:34:39 AM By: [name] RN Entered By: [name] on 08/20/2021 08:59:49 Kwong, Eloise M. ([id number]) -------------------------------------------------------------------------------- Activities of Daily Living Details Patient Name: [name]. Date of Service: 08/20/2021 8:45 AM Medical Record Number: [id number] Patient Account Number: [id number] Date of Birth/Sex: 1975-12-16 (46 y.o. F) Treating RN: [name] Primary Care Marquesa Rath: [name] Other Clinician: Referring Elfida Shimada: Daylene Katayama Treating Nahima Ales/Extender: [name] in Treatment: 0 Activities of Daily Living Items Answer Activities of Daily Living (Please select one for each item) Drive Automobile Completely Able Take Medications Completely Able Use Telephone Completely Able Care for Appearance Completely Able Use Toilet Completely Able Bath / Shower Completely Able Dress Self Completely Able Feed Self Completely Able Walk Completely Able Get In / Out Bed Completely Able Housework Completely Able Prepare Meals Completely Able Handle Money Completely Able Shop for Self Completely Able Electronic Signature(s) Signed: 08/24/2021 8:34:39 AM By: [name] RN Entered By: [name] on 08/20/2021 09:00:13 [name] ([id number]) -------------------------------------------------------------------------------- Education Screening Details Patient Name: [name]. Date of Service: 08/20/2021 8:45 AM Medical Record Number: [id number] Patient Account Number: [id number] Date of Birth/Sex: 27-Dec-1975 (46 y.o. F) Treating RN: [name] Primary Care Hanah Moultry: [name] Other Clinician: Referring Karmen Altamirano: Daylene Katayama Treating Shamell Suarez/Extender: [name] in Treatment: 0 Primary Learner Assessed: Patient Learning Preferences/Education Level/Primary Language Learning Preference: Explanation Highest Education Level: College or Above Preferred Language: English Cognitive Barrier Language Barrier: No Translator Needed: No Memory Deficit: No Emotional Barrier: No Cultural/Religious Beliefs Affecting Medical Care: No Physical Barrier Impaired Vision: Yes Glasses Impaired Hearing: No Decreased Hand dexterity: No Knowledge/Comprehension Knowledge Level: Medium Comprehension Level: Medium Ability to understand written instructions: Medium Ability to understand verbal instructions: Medium Motivation Anxiety Level: Anxious Cooperation: Cooperative Education Importance: Acknowledges Need Interest in Health Problems: Asks Questions Perception: Coherent Willingness to Engage in Self-Management High Activities: Readiness to Engage in Self-Management High Activities: Electronic Signature(s) Signed: 08/24/2021 8:34:39 AM By: [name] RN Entered By: [name] on 08/20/2021 09:00:41 [place], Ventura M. ([id number]) -------------------------------------------------------------------------------- Fall Risk Assessment Details Patient Name: [name]. Date of Service: 08/20/2021 8:45 AM Medical Record Number: [id number] Patient Account Number: [id number] Date of Birth/Sex: 09/16/1975 (46 y.o. F) Treating RN: [name] Primary  Care Sully Dyment: [name] Other Clinician: Referring Arlisa Leclere: Daylene Katayama Treating Brodie Scovell/Extender: [name] in Treatment: 0 Fall Risk Assessment Items Have you had 2 or more falls in the last 12 monthso 0 No Have you had any fall that resulted in injury in the last 12 monthso 0 No FALLS RISK SCREEN History of falling - immediate or within 3 months 0 No Secondary diagnosis (Do you have 2 or more medical diagnoseso) 0 No Ambulatory aid None/bed rest/wheelchair/nurse 0 No Crutches/cane/walker 0 No Furniture 0 No Intravenous therapy Access/Saline/Heparin Lock 0 No Gait/Transferring Normal/ bed rest/ wheelchair 0 No Weak (short steps  with or without shuffle, stooped but able to lift head while walking, may 0 No seek support from furniture) Impaired (short steps with shuffle, may have difficulty arising from chair, head down, impaired 0 No balance) Mental Status Oriented to own ability 0 No Electronic Signature(s) Signed: 08/24/2021 8:34:39 AM By: [name] RN Entered By: [name] on 08/20/2021 09:00:46 Dimario, Merina M. ([id number]) -------------------------------------------------------------------------------- Foot Assessment Details Patient Name: [name]. Date of Service: 08/20/2021 8:45 AM Medical Record Number: [id number] Patient Account Number: [id number] Date of Birth/Sex: April 20, 1975 (46 y.o. F) Treating RN: [name] Primary Care Michaella Imai: [name] Other Clinician: Referring Tarik Teixeira: Daylene Katayama Treating Kelena Garrow/Extender: [name] in Treatment: 0 Foot Assessment Items Site Locations + = Sensation present, - = Sensation absent, C = Callus, U = Ulcer R = Redness, W = Warmth, M = Maceration, PU = Pre-ulcerative lesion F = Fissure, S = Swelling, D = Dryness Assessment Right: Left: Other Deformity: No No Prior Foot Ulcer: Yes Yes Prior Amputation: Yes Yes Charcot Joint: Yes Yes Ambulatory Status: Ambulatory Without  Help Gait: Steady Electronic Signature(s) Signed: 08/24/2021 8:34:39 AM By: [name] RN Entered By: [name] on 08/20/2021 09:06:09 Sawyers, Eula M. ([id number]) -------------------------------------------------------------------------------- Nutrition Risk Screening Details Patient Name: [name]. Date of Service: 08/20/2021 8:45 AM Medical Record Number: [id number] Patient Account Number: [id number] Date of Birth/Sex: 01-10-1976 (46 y.o. F) Treating RN: [name] Primary Care Bhavika Schnider: [name] Other Clinician: Referring Petra Dumler: Daylene Katayama Treating Ovetta Bazzano/Extender: [name] in Treatment: 0 Height (in): 67 Weight (lbs): 153 Body Mass Index (BMI): 24 Nutrition Risk Screening Items Score Screening NUTRITION RISK SCREEN: I have an illness or condition that made me change the kind and/or amount of food I eat 0 No I eat fewer than two meals per day 0 No I eat few fruits and vegetables, or milk products 0 No I have three or more drinks of beer, liquor or wine almost every day 0 No I have tooth or mouth problems that make it hard for me to eat 0 No I don't always have enough money to buy the food I need 0 No I eat alone most of the time 0 No I take three or more different prescribed or over-the-counter drugs a day 1 Yes Without wanting to, I have lost or gained 10 pounds in the last six months 0 No I am not always physically able to shop, cook and/or feed myself 0 No Nutrition Protocols Good Risk Protocol 0 No interventions needed Moderate Risk Protocol High Risk Proctocol Risk Level: Good Risk Score: 1 Electronic Signature(s) Signed: 08/24/2021 8:34:39 AM By: [name] RN Entered By: [name] on 08/20/2021 09:00:55

## 2021-08-24 NOTE — Progress Notes (Signed)
TECLA, MAILLOUX ([id number]) Visit Report for 08/20/2021 Allergy List Details Patient Name: [name], [name]. Date of Service: 08/20/2021 8:45 AM Medical Record Number: [id number] Patient Account Number: [id number] Date of Birth/Sex: [birth year] (46 y.o. F) Treating RN: [name] Primary Care Florine Sprenkle: [name] Other Clinician: Referring Kelwin Gibler: Daylene Katayama Treating Issachar Broady/Extender: [name] in Treatment: 0 Allergies Active Allergies penicillin Reaction: hives Bactrim Reaction: breathing issues Sulfa (Sulfonamide Antibiotics) tramadol HCl clindamycin bee venom protein (honey bee) Allergy Notes Electronic Signature(s) Signed: 08/24/2021 8:34:39 AM By: [name] RN Entered By: [name] on 08/20/2021 08:59:21 [name], Kelly M. ([id number]) -------------------------------------------------------------------------------- Arrival Information Details Patient Name: [name]. Date of Service: 08/20/2021 8:45 AM Medical Record Number: [id number] Patient Account Number: [id number] Date of Birth/Sex: 04-12-1975 (46 y.o. F) Treating RN: [name] Primary Care Mattson Dayal: [name] Other Clinician: Referring Angeldejesus Callaham: Daylene Katayama Treating Marlo Arriola/Extender: [name] in Treatment: 0 Visit Information Patient Arrived: Ambulatory Arrival Time: 08:51 Accompanied By: self Transfer Assistance: None Patient Identification Verified: Yes Secondary Verification Process Completed: Yes Patient Requires Transmission-Based Precautions: No Patient Has Alerts: Yes Patient Alerts: ABI R 1.15 02/04/21 ABI L 1.11 02/04/21 History Since Last Visit All ordered tests and consults were completed: No Added or deleted any medications: No Any new allergies or adverse reactions: No Had a fall or experienced change in activities of daily living that may affect risk of falls: No Signs or symptoms of abuse/neglect since last visito No Hospitalized since last  visit: No Implantable device outside of the clinic excluding cellular tissue based products placed in the center since last visit: No Pain Present Now: Yes Electronic Signature(s) Signed: 08/24/2021 8:34:39 AM By: [name] RN Entered By: [name] on 08/20/2021 08:56:04 [name], Kelly M. ([id number]) -------------------------------------------------------------------------------- Clinic Level of Care Assessment Details Patient Name: [name]. Date of Service: 08/20/2021 8:45 AM Medical Record Number: [id number] Patient Account Number: [id number] Date of Birth/Sex: 04-Mar-1975 (46 y.o. F) Treating RN: [name] Primary Care Cephus Tupy: [name] Other Clinician: Referring Yeilin Zweber: Daylene Katayama Treating Camy Leder/Extender: [name] in Treatment: 0 Clinic Level of Care Assessment Items TOOL 1 Quantity Score X - Use when EandM and Procedure is performed on INITIAL visit 1 0 ASSESSMENTS - Nursing Assessment / Reassessment X - General Physical Exam (combine w/ comprehensive assessment (listed just below) when performed on new 1 20 pt. evals) X- 1 25 Comprehensive Assessment (HX, ROS, Risk Assessments, Wounds Hx, etc.) ASSESSMENTS - Wound and Skin Assessment / Reassessment '[]'$  - Dermatologic / Skin Assessment (not related to wound area) 0 ASSESSMENTS - Ostomy and/or Continence Assessment and Care '[]'$  - Incontinence Assessment and Management 0 '[]'$  - 0 Ostomy Care Assessment and Management (repouching, etc.) PROCESS - Coordination of Care X - Simple Patient / Family Education for ongoing care 1 15 '[]'$  - 0 Complex (extensive) Patient / Family Education for ongoing care X- 1 10 Staff obtains Programmer, systems, Records, Test Results / Process Orders '[]'$  - 0 Staff telephones HHA, Nursing Homes / Clarify orders / etc '[]'$  - 0 Routine Transfer to another Facility (non-emergent condition) '[]'$  - 0 Routine Hospital Admission (non-emergent condition) X- 1 15 New Admissions /  Biomedical engineer / Ordering NPWT, Apligraf, etc. '[]'$  - 0 Emergency Hospital Admission (emergent condition) PROCESS - Special Needs '[]'$  - Pediatric / Minor Patient Management 0 '[]'$  - 0 Isolation Patient Management '[]'$  - 0 Hearing / Language / Visual special needs '[]'$  - 0 Assessment of Community assistance (transportation, D/C planning, etc.) '[]'$  - 0  Additional assistance / Altered mentation '[]'$  - 0 Support Surface(s) Assessment (bed, cushion, seat, etc.) INTERVENTIONS - Miscellaneous '[]'$  - External ear exam 0 '[]'$  - 0 Patient Transfer (multiple staff / Civil Service fast streamer / Similar devices) '[]'$  - 0 Simple Staple / Suture removal (25 or less) '[]'$  - 0 Complex Staple / Suture removal (26 or more) '[]'$  - 0 Hypo/Hyperglycemic Management (do not check if billed separately) '[]'$  - 0 Ankle / Brachial Index (ABI) - do not check if billed separately Has the patient been seen at the hospital within the last three years: Yes Total Score: 85 Level Of Care: New/Established - Level 3 [name], Kelly M. ([id number]) Electronic Signature(s) Signed: 08/24/2021 8:34:39 AM By: [name] RN Entered By: [name] on 08/20/2021 09:27:42 [name], Kelly M. ([id number]) -------------------------------------------------------------------------------- Encounter Discharge Information Details Patient Name: [name]. Date of Service: 08/20/2021 8:45 AM Medical Record Number: [id number] Patient Account Number: [id number] Date of Birth/Sex: 1975-09-03 (46 y.o. F) Treating RN: [name] Primary Care Natali Lavallee: [name] Other Clinician: Referring Samyria Rudie: Daylene Katayama Treating Janari Gagner/Extender: [name] in Treatment: 0 Encounter Discharge Information Items Post Procedure Vitals Discharge Condition: Stable Temperature (F): 98.7 Ambulatory Status: Ambulatory Pulse (bpm): 90 Discharge Destination: Home Respiratory Rate (breaths/min): 16 Transportation: Private Auto Blood Pressure (mmHg):  107/72 Accompanied By: self Schedule Follow-up Appointment: Yes Clinical Summary of Care: Electronic Signature(s) Signed: 08/24/2021 8:34:39 AM By: [name] RN Entered By: [name] on 08/20/2021 09:31:52 [name], [name] ([id number]) -------------------------------------------------------------------------------- Lower Extremity Assessment Details Patient Name: [name]. Date of Service: 08/20/2021 8:45 AM Medical Record Number: [id number] Patient Account Number: [id number] Date of Birth/Sex: 21-Oct-1975 (46 y.o. F) Treating RN: [name] Primary Care Jowana Thumma: [name] Other Clinician: Referring Mariyam Remington: Daylene Katayama Treating Makayela Secrest/Extender: [name] in Treatment: 0 Edema Assessment Assessed: [Left: No] [Right: No] Edema: [Left: Ye] [Right: s] Calf Left: Right: Point of Measurement: 30 cm From Medial Instep 34 cm Ankle Left: Right: Point of Measurement: 10 cm From Medial Instep 21 cm Knee To Floor Left: Right: From Medial Instep 42 cm Vascular Assessment Pulses: Dorsalis Pedis Palpable: [Right:Yes] Electronic Signature(s) Signed: 08/24/2021 8:34:39 AM By: [name] RN Entered By: [name] on 08/20/2021 09:08:13 [name], Kelly M. ([id number]) -------------------------------------------------------------------------------- Multi Wound Chart Details Patient Name: [name]. Date of Service: 08/20/2021 8:45 AM Medical Record Number: [id number] Patient Account Number: [id number] Date of Birth/Sex: 12/18/1975 (46 y.o. F) Treating RN: [name] Primary Care Letisia Schwalb: [name] Other Clinician: Referring Reeya Bound: Daylene Katayama Treating Alexza Norbeck/Extender: [name] in Treatment: 0 Vital Signs Height(in): 67 Pulse(bpm): 90 Weight(lbs): 153 Blood Pressure(mmHg): 107/72 Body Mass Index(BMI): 24 Temperature(F): 98.7 Respiratory Rate(breaths/min): 16 Photos: [N/A:N/A] Wound Location: Posterior Foot N/A  N/A Wounding Event: Trauma N/A N/A Primary Etiology: Trauma, Other N/A N/A Comorbid History: Anemia, Asthma, History of N/A N/A pressure wounds, Osteomyelitis, Neuropathy Date Acquired: 06/18/2021 N/A N/A Weeks of Treatment: 0 N/A N/A Wound Status: Open N/A N/A Wound Recurrence: No N/A N/A Measurements L x W x D (cm) 1x0.9x0.4 N/A N/A Area (cm) : 0.707 N/A N/A Volume (cm) : 0.283 N/A N/A Classification: Full Thickness Without Exposed N/A N/A Support Structures Exudate Amount: Medium N/A N/A Exudate Type: Serosanguineous N/A N/A Exudate Color: red, brown N/A N/A Granulation Amount: Medium (34-66%) N/A N/A Granulation Quality: Red N/A N/A Necrotic Amount: Medium (34-66%) N/A N/A Exposed Structures: Fat Layer (Subcutaneous Tissue): N/A N/A Yes Fascia: No Tendon: No Muscle: No Joint: No Bone: No Epithelialization: None N/A N/A Treatment Notes  Electronic Signature(s) Signed: 08/24/2021 8:34:39 AM By: [name] RN Entered By: [name] on 08/20/2021 09:24:57 [name] ([id number]) -------------------------------------------------------------------------------- [place] Details Patient Name: [name]. Date of Service: 08/20/2021 8:45 AM Medical Record Number: [id number] Patient Account Number: [id number] Date of Birth/Sex: 05-Feb-1975 (46 y.o. F) Treating RN: [name] Primary Care Taahir Grisby: [name] Other Clinician: Referring Fortunato [name]: Daylene Katayama Treating Stpehen Petitjean/Extender: [name] in Treatment: 0 Active Inactive Wound/Skin Impairment Nursing Diagnoses: Knowledge deficit related to ulceration/compromised skin integrity Goals: Patient/caregiver will verbalize understanding of skin care regimen Date Initiated: 08/20/2021 Target Resolution Date: 09/20/2021 Goal Status: Active Ulcer/skin breakdown will have a volume reduction of 30% by week 4 Date Initiated: 08/20/2021 Target Resolution Date: 09/20/2021 Goal Status:  Active Ulcer/skin breakdown will have a volume reduction of 50% by week 8 Date Initiated: 08/20/2021 Target Resolution Date: 10/20/2021 Goal Status: Active Ulcer/skin breakdown will have a volume reduction of 80% by week 12 Date Initiated: 08/20/2021 Target Resolution Date: 11/20/2021 Goal Status: Active Ulcer/skin breakdown will heal within 14 weeks Date Initiated: 08/20/2021 Target Resolution Date: 12/20/2021 Goal Status: Active Interventions: Assess patient/caregiver ability to obtain necessary supplies Assess patient/caregiver ability to perform ulcer/skin care regimen upon admission and as needed Assess ulceration(s) every visit Notes: Electronic Signature(s) Signed: 08/24/2021 8:34:39 AM By: [name] RN Entered By: [name] on 08/20/2021 09:23:55 [name], Kelly M. ([id number]) -------------------------------------------------------------------------------- Pain Assessment Details Patient Name: [name]. Date of Service: 08/20/2021 8:45 AM Medical Record Number: [id number] Patient Account Number: [id number] Date of Birth/Sex: February 16, 1975 (46 y.o. F) Treating RN: [name] Primary Care Gurshaan Matsuoka: [name] Other Clinician: Referring Henreitta Spittler: Daylene Katayama Treating Jalen Oberry/Extender: [name] in Treatment: 0 Active Problems Location of Pain Severity and Description of Pain Patient Has Paino Yes Site Locations With Dressing Change: Yes Duration of the Pain. Constant / Intermittento Constant Rate the pain. Current Pain Level: 8 Worst Pain Level: 10 Least Pain Level: 6 Tolerable Pain Level: 5 Character of Pain Describe the Pain: Burning, Shooting Pain Management and Medication Current Pain Management: Medication: Yes Cold Application: No Rest: Yes Massage: No Activity: No T.E.N.S.: No Heat Application: No Leg drop or elevation: No Is the Current Pain Management Adequate: Inadequate How does your wound impact your activities of daily  livingo Sleep: Yes Bathing: No Appetite: Yes Relationship With Others: No Bladder Continence: No Emotions: No Bowel Continence: No Work: No Toileting: No Drive: No Dressing: No Hobbies: No Electronic Signature(s) Signed: 08/24/2021 8:34:39 AM By: [name] RN Entered By: [name] on 08/20/2021 08:57:00 [name], Kelly M. ([id number]) -------------------------------------------------------------------------------- Patient/Caregiver Education Details Patient Name: [name]. Date of Service: 08/20/2021 8:45 AM Medical Record Number: [id number] Patient Account Number: [id number] Date of Birth/Gender: January 04, 1976 (46 y.o. F) Treating RN: [name] Primary Care Physician: [name] Other Clinician: Referring Physician: Daylene Katayama Treating Physician/Extender: [name] in Treatment: 0 Education Assessment Education Provided To: Patient Education Topics Provided Wound/Skin Impairment: Methods: Explain/Verbal Responses: State content correctly Electronic Signature(s) Signed: 08/24/2021 8:34:39 AM By: [name] RN Entered By: [name] on 08/20/2021 72:09:47 [name], [name] ([id number]) -------------------------------------------------------------------------------- Wound Assessment Details Patient Name: [name]. Date of Service: 08/20/2021 8:45 AM Medical Record Number: [id number] Patient Account Number: [id number] Date of Birth/Sex: 1976/01/13 (46 y.o. F) Treating RN: [name] Primary Care Gustaf Mccarter: [name] Other Clinician: Referring Bashir Marchetti: Daylene Katayama Treating Larua Collier/Extender: [name] in Treatment: 0 Wound Status Wound Number: 2 Primary Trauma, Other Etiology: Wound Location: Posterior Foot Wound Status:  Open Wounding Event: Trauma Comorbid Anemia, Asthma, History of pressure wounds, Date Acquired: 06/18/2021 History: Osteomyelitis, Neuropathy Weeks Of Treatment: 0 Clustered Wound:  No Photos Wound Measurements Length: (cm) 1 Width: (cm) 0.9 Depth: (cm) 0.4 Area: (cm) 0.707 Volume: (cm) 0.283 % Reduction in Area: % Reduction in Volume: Epithelialization: None Tunneling: No Undermining: No Wound Description Classification: Full Thickness Without Exposed Support Structu Exudate Amount: Medium Exudate Type: Serosanguineous Exudate Color: red, brown res Foul Odor After Cleansing: No Slough/Fibrino Yes Wound Bed Granulation Amount: Medium (34-66%) Exposed Structure Granulation Quality: Red Fascia Exposed: No Necrotic Amount: Medium (34-66%) Fat Layer (Subcutaneous Tissue) Exposed: Yes Tendon Exposed: No Muscle Exposed: No Joint Exposed: No Bone Exposed: No Electronic Signature(s) Signed: 08/24/2021 8:34:39 AM By: [name] RN Entered By: [name] on 08/20/2021 09:10:21 [name], Kelly M. ([id number]) -------------------------------------------------------------------------------- Vitals Details Patient Name: [name]. Date of Service: 08/20/2021 8:45 AM Medical Record Number: [id number] Patient Account Number: [id number] Date of Birth/Sex: Mar 24, 1975 (46 y.o. F) Treating RN: [name] Primary Care Pietrina Jagodzinski: [name] Other Clinician: Referring Shacola Schussler: Daylene Katayama Treating Jamir Rone/Extender: [name] in Treatment: 0 Vital Signs Time Taken: 08:57 Temperature (F): 98.7 Height (in): 67 Pulse (bpm): 90 Source: Stated Respiratory Rate (breaths/min): 16 Weight (lbs): 153 Blood Pressure (mmHg): 107/72 Source: Stated Reference Range: 80 - 120 mg / dl Body Mass Index (BMI): 24 Electronic Signature(s) Signed: 08/24/2021 8:34:39 AM By: [name] RN Entered By: [name] on 08/20/2021 08:57:38

## 2021-08-28 ENCOUNTER — Ambulatory Visit: Admitting: Physician Assistant

## 2021-08-31 ENCOUNTER — Ambulatory Visit (INDEPENDENT_AMBULATORY_CARE_PROVIDER_SITE_OTHER): Payer: Medicare PPO | Admitting: Psychology

## 2021-08-31 DIAGNOSIS — F331 Major depressive disorder, recurrent, moderate: Secondary | ICD-10-CM

## 2021-08-31 NOTE — Progress Notes (Signed)
[name] is a 46 y.o. female patient .  Diagnosis 296.30 (Major depressive affective disorder, recurrent episode, unspecified) [n/a]  Symptoms Depressed or irritable mood. (Status: maintained) -- No Description Entered  Diminished interest in or enjoyment of activities. (Status: maintained) -- No Description Entered  Feelings of hopelessness, worthlessness, or inappropriate guilt. (Status: maintained) -- No Description Entered  Lack of energy. (Status: maintained) -- No Description Entered  Unresolved grief issues. (Status: maintained) -- No Description Entered  Medication Status compliance  Safety none  If Suicidal or Homicidal State Action Taken: unspecified  Current Risk: low Medications Hydroxyzine (Dosage: unknown)  Remiron (Dosage: unknown)  Sertraline (Dosage: unknown)  Objectives Related Problem: Appropriately grieve the loss in order to normalize mood and to return to previously adaptive level of functioning. Description: Increasingly verbalize hopeful and positive statements regarding self, others, and the future. Target Date: 2022-01-13 Frequency: Daily Modality: individual Progress: 80%  Related Problem: Appropriately grieve the loss in order to normalize mood and to return to previously adaptive level of functioning. Description: Learn and implement conflict resolution skills to resolve interpersonal problems. Target Date: 2022-01-13 Frequency: Daily Modality: individual Progress: 80%  Related Problem: Appropriately grieve the loss in order to normalize mood and to return to previously adaptive level of functioning. Description: Learn and implement problem-solving and decision-making skills. Target Date: 2022-01-13 Frequency: Daily Modality: individual Progress: 80%  Related Problem: Appropriately grieve the loss in order to normalize mood and to return to previously adaptive level of  functioning. Description: Describe current and past experiences with depression including their impact on functioning and attempts to resolve it. Target Date: 2021-07-14 Frequency: Daily Modality: individual Progress: 100%-completed  Related Problem: Complete the process of letting go of the lost significant other. Description: Report decreased time spent each day focusing on the loss. Target Date: 2022-01-13 Frequency: Daily Modality: individual Progress: 80%  Related Problem: Complete the process of letting go of the lost significant other. Description: Decrease unrealistic thoughts, statements, and feelings of being responsible for the loss. Target Date: 2021-07-14 Frequency: Daily Modality: individual Progress: 100%-completed  Related Problem: Complete the process of letting go of the lost significant other. Description: Verbalize and resolve feelings of anger or guilt focused on self or deceased loved one that interfere with the grieving process. Target Date: 2021-07-14 Frequency: Daily Modality: individual Progress: 100%-completed  Related Problem: Complete the process of letting go of the lost significant other. Description: Begin verbalizing feelings associated with the loss. Target Date: 2022-01-13 Frequency: Daily Modality: individual Progress: 80%  Related Problem: Complete the process of letting go of the lost significant other. Description: Identify what stages of grief have been experienced in the continuum of the grieving process. Target Date: 2021-07-14 Frequency: Daily Modality: individual Progress: 100%-completed  Related Problem: Complete the process of letting go of the lost significant other. Description: Participate in a therapy that addresses issues beyond grief that have arisen as a result of the loss. Target Date: 2022-01-13 Frequency: Daily Modality: individual Progress: 80%  Related Problem: Complete the process of letting go of the lost  significant other. Description: Tell in detail the story of the current loss that is triggering symptoms. Target Date: 2021-07-14 Frequency: Daily Modality: individual Progress: 100%-completed  Client Response full compliance  Service Location Location, [address] [place], [place] [address]  Service Code cpt  19622W  Validate/empathize  Related past to present  Behavioral activation plan  Facilitate problem solving  Identify automatic thoughts  Rationally challenge thoughts or beliefs/cognitive restructuring  Identify/label emotions  Emotion regulation skills  Psychiatrist  Self care activities  Self-monitoring  Session Notes: Patient requests a video Webex session due to the coronavirus. She is at home and I am at my home office.  Dx: Major Depression  Meds: Lexapro ('20mg'$ ), Busperone (7.5)  Goals: States that she is seeking counseling to learn to manage the grief reaction to losing her husband. She reports feeling tired all the time and that she has no energy or motivation. Needs to develop coping strategies. Therapy to focus on reducing despair, sadness, hopelessness and helplessness. 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 Will explore those early traumas/losses and current triggers. Goal dates: 04-2021 Revised Goals: Patient is doing much better with her grief/mourning process. She now needs help managing her reactions to her dysfunctional FOO. In addition, her life is out of control and she need help to focus and establish a plan. Continues to feel depressed and overwhelmed. Goal date is 12-23  [name] states that she is doing okay. Her brother and sister in law came in town Saturday and "that is always difficult". Her sister in law was difficult and her parents noticed her attitude. Her sister in law was rude and [name] felt her parents were obviously uncomfortable. This was gratifying for [name]. She still has  frustrations with her parents. Her mother will be very "punishing" when upset with [name] and will stop talking to her.   She says she stayed in bed all day sleeping yesterday, Denies feeling depressed and says she was "just exhausted". [name] says that she is in touch with gratitude for the things she has and no longer stays focused on the negative. She is living a much more satisfying life. She is more assertive and self-respecting. Has set better boundaries with her parents.                     [name], PhD Time: 7:40a-8:30a 50 minutes

## 2021-09-01 ENCOUNTER — Encounter: Payer: Medicare PPO | Admitting: Physician Assistant

## 2021-09-01 DIAGNOSIS — M86671 Other chronic osteomyelitis, right ankle and foot: Secondary | ICD-10-CM | POA: Diagnosis not present

## 2021-09-01 DIAGNOSIS — L02611 Cutaneous abscess of right foot: Secondary | ICD-10-CM | POA: Diagnosis not present

## 2021-09-01 DIAGNOSIS — G7100 Muscular dystrophy, unspecified: Secondary | ICD-10-CM | POA: Diagnosis not present

## 2021-09-01 DIAGNOSIS — G600 Hereditary motor and sensory neuropathy: Secondary | ICD-10-CM | POA: Diagnosis not present

## 2021-09-01 DIAGNOSIS — L97512 Non-pressure chronic ulcer of other part of right foot with fat layer exposed: Secondary | ICD-10-CM | POA: Diagnosis not present

## 2021-09-01 DIAGNOSIS — L84 Corns and callosities: Secondary | ICD-10-CM | POA: Diagnosis not present

## 2021-09-02 NOTE — Progress Notes (Addendum)
Kelly, [name] ([id number]) Visit Report for 09/01/2021 Chief Complaint Document Details Patient Name: [name], [name]. Date of Service: 09/01/2021 8:30 AM Medical Record Number: [id number] Patient Account Number: [id number] Date of Birth/Sex: January 01, 1976 (46 y.o. F) Treating RN: Alycia Rossetti Primary Care Provider: [name] Other Clinician: Referring Provider: [name] Treating Provider/Extender: [name] in Treatment: 1 Information Obtained from: Patient Chief Complaint Right foot ulcer Electronic Signature(s) Signed: 09/01/2021 8:40:32 AM By: [name] Entered By: Worthy Keeler on 09/01/2021 08:40:32 Maxon, Lillar Jerilynn Mages ([id number]) -------------------------------------------------------------------------------- Debridement Details Patient Name: [name]. Date of Service: 09/01/2021 8:30 AM Medical Record Number: [id number] Patient Account Number: [id number] Date of Birth/Sex: November 24, 1975 (46 y.o. F) Treating RN: Alycia Rossetti Primary Care Provider: [name] Other Clinician: Referring Provider: [name] Treating Provider/Extender: [name] in Treatment: 1 Debridement Performed for Wound #2 Posterior Foot Assessment: Performed By: Physician [name], [name] Debridement Type: Debridement Level of Consciousness (Pre- Awake and Alert procedure): Pre-procedure Verification/Time Out Yes - 09:00 Taken: Start Time: 09:00 Pain Control: Lidocaine 4% Topical Solution Total Area Debrided (L x W): 0.5 (cm) x 1 (cm) = 0.5 (cm) Tissue and other material Viable, Non-Viable, Callus, Slough, Subcutaneous, Slough debrided: Level: Skin/Subcutaneous Tissue Debridement Description: Excisional Instrument: Curette Bleeding: Moderate Hemostasis Achieved: Pressure Response to Treatment: Procedure was tolerated well Level of Consciousness (Post- Awake and Alert procedure): Post Debridement Measurements of Total  Wound Length: (cm) 0.5 Width: (cm) 1 Depth: (cm) 0.4 Volume: (cm) 0.157 Character of Wound/Ulcer Post Debridement: Improved Post Procedure Diagnosis Same as Pre-procedure Electronic Signature(s) Signed: 09/01/2021 4:17:10 PM By: Alycia Rossetti Signed: 09/03/2021 5:21:19 PM By: [name] Entered By: Alycia Rossetti on 09/01/2021 09:05:58 Corredor, Ivis M. ([id number]) -------------------------------------------------------------------------------- HPI Details Patient Name: [name]. Date of Service: 09/01/2021 8:30 AM Medical Record Number: [id number] Patient Account Number: [id number] Date of Birth/Sex: [birth year] (46 y.o. F) Treating RN: Alycia Rossetti Primary Care Provider: [name] Other Clinician: Referring Provider: [name] Treating Provider/Extender: [name] in Treatment: 1 History of Present Illness Location: right first metatarsal head plantar aspect Quality: Patient reports experiencing a dull pain to affected area(s). Severity: Patient states wound are getting worse. Duration: Patient has had the wound for > 2 months prior to seeking treatment at the wound center Timing: Pain in wound is Intermittent (comes and goes Context: The wound appeared gradually over time Modifying Factors: Other treatment(s) tried include:pain clinic for her narcotics Associated Signs and Symptoms: Patient reports having difficulty standing for long periods. HPI Description: 46 year old patient sent to [name] by her PCP 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 Her notes were reviewed and there was extensive history of opioid treatment and management by the [facility] pain clinic from where she has been terminated. X-ray of the right foot foot was done on 03/13/2015 and was suggestive of active  infection and possible osteomyelitis. her past surgical history includes amputation of the left big toe in 2011 and amputation of the right big toe in 2015. She was sitting the wound clinic at [facility] and we will try and obtain these notes. She is a smoker and smokes about 15 cigarettes a day. 06/12/2015 - x-ray of the right foot -- IMPRESSION:1. Soft tissue swelling of the plantar surface of the foot. No underlying acute abnormality. 2. Amputation  deformity again of the right great toe. Old healed bony deformity noted the right second metatarsal. No acute bony abnormality identified. We have not received any reports from the [facility] hospital yet. She continues to smoke at least 10 cigarettes a day 06/19/2015 -- she has increasing pain in the right foot and also has had some low-grade fever. Her vascular test is scheduled for tomorrow and her MRIs not till next week. Readmission: 08-20-2021 upon evaluation today patient presents for initial inspection here in our clinic although she has been seen that was back in 2017. At that time she was having a issue with her right foot. With that being said upon inspection today she is actually having issues with the right foot as well on the medial aspect where she has some breakdown due to what I am assuming is pressure to the foot location. 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 I did review her culture report which showed evidence of bacteria including hemolytic Streptococcus group A as well as Staphylococcus aureus. The patient is currently on doxycycline and tells me that this is getting much better. Topical gentamicin is also being utilized. Also did review her arterial studies which show that she has on the right and ABI of 1.15 which is triphasic and appears to be excellent  there is no significant peripheral artery disease noted the left is also. With ABI of 1.11 and a good report in that regard as well. Lastly I did also review her MRI of the right forefoot which was performed on 07-06-2021. This shows that she does have mild subcortical marrow edema in the stump of the first metatarsal concerning for osteomyelitis. There is also a malunited fracture of the second metatarsal. The patient has bone marrow edema in the base of the second third fourth and fifth metatarsals concerning for stress reaction obviously this infection is going to be an issue for [name] here as well she needs to be treated appropriately and I do believe that the doxycycline is a good option right now although I may need to #1 extend this and #2 plan for even altering or adding to the treatment regimen depending on how things progress. Patient voiced understanding.Marland Kitchen 09-01-2021 upon evaluation today patient appears to be doing excellent in regard to her wound. She is actually showing signs of excellent improvement which is great news. Fortunately I do not see any signs of active infection locally or systemically at this point which is great news and overall I am extremely pleased with where things stand today. I do not see any evidence of active infection locally or systemically. Electronic Signature(s) Signed: 09/01/2021 10:17:23 AM By: [name] Entered By: Worthy Keeler on 09/01/2021 10:17:23 Shell, Rayetta Jerilynn Mages ([id number]) -------------------------------------------------------------------------------- Physical Exam Details Patient Name: [name]. Date of Service: 09/01/2021 8:30 AM Medical Record Number: [id number] Patient Account Number: [id number] Date of Birth/Sex: Jan 17, 1976 (46 y.o. F) Treating RN: Alycia Rossetti Primary Care Provider: [name] Other Clinician: Referring Provider: [name] Treating Provider/Extender: [name] in Treatment:  1 Constitutional Well-nourished and well-hydrated in no acute distress. Respiratory normal breathing without difficulty. Psychiatric this patient is able to make decisions and demonstrates good insight into disease process. Alert and Oriented x 3. pleasant and cooperative. Notes Upon inspection patient's wound bed showed signs of good granulation  and epithelization at this point. Fortunately I see no evidence of active infection at this time which is great news and overall I do believe that she is on the right track. I do think a total contact casting could be a possibility but as long as she is making progress I am not even sure that is going to be necessary. Electronic Signature(s) Signed: 09/01/2021 10:17:44 AM By: [name] Entered By: Worthy Keeler on 09/01/2021 10:17:44 Demauro, Narjis Jerilynn Mages ([id number]) -------------------------------------------------------------------------------- Physician Orders Details Patient Name: [name]. Date of Service: 09/01/2021 8:30 AM Medical Record Number: [id number] Patient Account Number: [id number] Date of Birth/Sex: 07/08/1975 (46 y.o. 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 Bathing/ Shower/ Hygiene o May shower with wound dressing protected with water repellent cover or cast protector. o No tub bath. Anesthetic (Use 'Patient Medications' Section for Anesthetic Order Entry) o Lidocaine  applied to wound bed Edema Control - Lymphedema / Segmental Compressive Device / Other o Elevate, Exercise Daily and Avoid Standing for Long Periods of Time. o Elevate legs to the level of the heart and pump ankles as often as possible o Elevate leg(s) parallel to the floor when sitting. Wound Treatment Wound #2 - Foot Wound Laterality: Posterior Cleanser: Byram Ancillary Kit - 15 Day Supply (Generic) 3 x Per Day/30 Days Discharge Instructions: Use supplies as instructed; Kit contains: (15) Saline Bullets; (15) 3x3 Gauze; 15 pr Gloves Cleanser: Soap and Water 3 x Per Day/30 Days Discharge Instructions: Gently cleanse wound with antibacterial soap, rinse and pat dry prior to dressing wounds Topical: Gentamicin 3 x Per Day/30 Days Discharge Instructions: Apply as directed by provider. Primary Dressing: Hydrofera Blue Ready Transfer Foam, 4x5 (in/in) (Generic) 3 x Per Day/30 Days Discharge Instructions: Apply Hydrofera Blue Ready to wound bed as directed Secondary Dressing: (BORDER) Zetuvit Plus SILICONE BORDER Dressing 4x4 (in/in) (Generic) 3 x Per Day/30 Days Discharge Instructions: Please do not put silicone bordered dressings under wraps. Use non-bordered dressing only. Electronic Signature(s) Signed: 09/01/2021 4:17:10 PM By: Alycia Rossetti Signed: 09/03/2021 5:21:19 PM By: [name] Entered By: Alycia Rossetti on 09/01/2021 09:07:33 Moure, Eurika M. ([id number]) -------------------------------------------------------------------------------- Problem List Details Patient Name: [name]. Date of Service: 09/01/2021 8:30 AM Medical Record Number: [id number] Patient Account Number: [id number] Date of Birth/Sex: 09-28-1975 (46 y.o. 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 ([id number]) -------------------------------------------------------------------------------- Progress Note Details Patient Name: [name]. Date of Service: 09/01/2021 8:30 AM Medical  Record Number: [id number] Patient Account Number: [id number] Date of Birth/Sex: May 22, 1975 (46 y.o. F) Treating RN: Alycia Rossetti Primary Care Provider: [name] Other Clinician: Referring Provider: [name] Treating Provider/Extender: [name] in Treatment: 1 Subjective Chief Complaint Information obtained from Patient Right foot ulcer History of Present Illness (HPI) The following HPI elements were documented for the patient's wound: Location: right first metatarsal head plantar aspect Quality: Patient reports experiencing a dull pain to affected area(s). Severity: Patient states wound are getting worse. Duration: Patient has had the wound for > 2 months prior to seeking treatment at the wound center Timing: Pain in wound is Intermittent (comes and goes Context: The wound appeared gradually over time Modifying Factors: Other treatment(s) tried include:pain clinic for her narcotics Associated Signs and Symptoms: Patient reports having difficulty standing for long periods. 46 year old patient sent to [name] by her PCP 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 Her notes were reviewed and there was extensive history of opioid treatment and management by the [facility] pain clinic from where she has been terminated. X-ray of the right foot foot was done on 03/13/2015 and was suggestive of active infection and possible osteomyelitis. her past surgical history includes amputation of the left big toe in 2011 and amputation of the right big toe in 2015. She was sitting the wound clinic at [facility] and we will try and obtain these notes. She is a smoker and smokes about 15 cigarettes a day. 06/12/2015 - x-ray of the right foot -- IMPRESSION:1. Soft tissue swelling of the plantar surface of the foot. No underlying acute abnormality. 2. Amputation deformity again of the right great toe. Old healed bony deformity noted the right second metatarsal. No acute bony abnormality identified. We have not received any reports from the [facility] hospital yet. She continues to smoke at least 10 cigarettes a day 06/19/2015 -- she has increasing pain in the right foot and also has had some low-grade fever. Her vascular test is scheduled for tomorrow and her MRIs not till next week. Readmission: 08-20-2021 upon evaluation today patient presents for initial inspection here in our clinic although she has been seen that was back in 2017. At that time she was having a issue with her right foot. With that being said upon inspection today she is actually having issues with the right foot as well on the medial aspect where she has some breakdown due to what I am assuming is pressure to the foot location. 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 I did review her culture report which showed evidence of bacteria including hemolytic  Streptococcus group A as well as Staphylococcus aureus. The patient is currently on doxycycline and tells me that this is getting much better. Topical gentamicin is also being utilized. Also did review her arterial studies which show that she has on the right and ABI of 1.15 which is triphasic and appears to be excellent there is no significant peripheral artery disease noted the left is also. With ABI of 1.11 and a good report in that regard as well. Lastly I did also review her MRI of the right forefoot which was performed on 07-06-2021. This shows  that she does have mild subcortical marrow edema in the stump of the first metatarsal concerning for osteomyelitis. There is also a malunited fracture of the second metatarsal. The patient has bone marrow edema in the base of the second third fourth and fifth metatarsals concerning for stress reaction obviously this infection is going to be an issue for [name] here as well she needs to be treated appropriately and I do believe that the doxycycline is a good option right now although I may need to #1 extend this and #2 plan for even altering or adding to the treatment regimen depending on how things progress. Patient voiced understanding.Marland Kitchen 09-01-2021 upon evaluation today patient appears to be doing excellent in regard to her wound. She is actually showing signs of excellent improvement which is great news. Fortunately I do not see any signs of active infection locally or systemically at this point which is great news and overall I am extremely pleased with where things stand today. I do not see any evidence of active infection locally or systemically. Objective Villwock, Rejina M. ([id number]) Constitutional Well-nourished and well-hydrated in no acute distress. Vitals Time Taken: 9:35 AM, Height: 67 in, Weight: 153 lbs, BMI: 24, Temperature: 98.4 F, Pulse: 80 bpm, Respiratory Rate: 16 breaths/min, Blood Pressure: 119/80 mmHg. Respiratory normal breathing  without difficulty. Psychiatric this patient is able to make decisions and demonstrates good insight into disease process. Alert and Oriented x 3. pleasant and cooperative. General Notes: Upon inspection patient's wound bed showed signs of good granulation and epithelization at this point. Fortunately I see no evidence of active infection at this time which is great news and overall I do believe that she is on the right track. I do think a total contact casting could be a possibility but as long as she is making progress I am not even sure that is going to be necessary. Integumentary (Hair, Skin) Wound #2 status is Open. Original cause of wound was Trauma. The date acquired was: 06/18/2021. The wound has been in treatment 1 weeks. The wound is located on the Posterior Foot. The wound measures 0.5cm length x 1cm width x 0.2cm depth; 0.393cm^2 area and 0.079cm^3 volume. There is Fat Layer (Subcutaneous Tissue) exposed. There is undermining starting at 11:00 and ending at 12:00 with a maximum distance of 0.3cm. There is a none present amount of drainage noted. There is large (67-100%) red granulation within the wound bed. There is no necrotic tissue within the wound bed. General Notes: Callous surrounding wound Assessment Active Problems ICD-10 Other chronic osteomyelitis, right ankle and foot Non-pressure chronic ulcer of other part of right foot with fat layer exposed Muscular dystrophy, unspecified Hereditary motor and sensory neuropathy Corns and callosities Procedures Wound #2 Pre-procedure diagnosis of Wound #2 is an Abscess located on the Posterior Foot . There was a Excisional Skin/Subcutaneous Tissue Debridement with a total area of 0.5 sq cm performed by [name], [name]. With the following instrument(s): Curette to remove Viable and Non-Viable tissue/material. Material removed includes Callus, Subcutaneous Tissue, and Slough after achieving pain control using Lidocaine 4%  Topical Solution. No specimens were taken. A time out was conducted at 09:00, prior to the start of the procedure. A Moderate amount of bleeding was controlled with Pressure. The procedure was tolerated well. Post Debridement Measurements: 0.5cm length x 1cm width x 0.4cm depth; 0.157cm^3 volume. Character of Wound/Ulcer Post Debridement is improved. Post procedure Diagnosis Wound #2: Same as Pre-Procedure Plan Follow-up Appointments: Return Appointment in 1 week.  Bathing/ Shower/ Hygiene: May shower with wound dressing protected with water repellent cover or cast protector. No tub bath. Anesthetic (Use 'Patient Medications' Section for Anesthetic Order Entry): Lidocaine applied to wound bed Edema Control - Lymphedema / Segmental Compressive Device / Other: Elevate, Exercise Daily and Avoid Standing for Long Periods of Time. Elevate legs to the level of the heart and pump ankles as often as possible Mersereau, Dollye M. ([id number]) Elevate leg(s) parallel to the floor when sitting. WOUND #2: - Foot Wound Laterality: Posterior Cleanser: Byram Ancillary Kit - 15 Day Supply (Generic) 3 x Per Day/30 Days Discharge Instructions: Use supplies as instructed; Kit contains: (15) Saline Bullets; (15) 3x3 Gauze; 15 pr Gloves Cleanser: Soap and Water 3 x Per Day/30 Days Discharge Instructions: Gently cleanse wound with antibacterial soap, rinse and pat dry prior to dressing wounds Topical: Gentamicin 3 x Per Day/30 Days Discharge Instructions: Apply as directed by provider. Primary Dressing: Hydrofera Blue Ready Transfer Foam, 4x5 (in/in) (Generic) 3 x Per Day/30 Days Discharge Instructions: Apply Hydrofera Blue Ready to wound bed as directed Secondary Dressing: (BORDER) Zetuvit Plus SILICONE BORDER Dressing 4x4 (in/in) (Generic) 3 x Per Day/30 Days Discharge Instructions: Please do not put silicone bordered dressings under wraps. Use non-bordered dressing only. 1. I am going to suggest that we  going continue with the wound care measures as before and the patient is in agreement with the plan. This includes the use of the gentamicin topically though she should just use a very thin amount of this at this point. 2. Also can recommend Hydrofera Blue to cover. 3. I am also going to suggest the patient should continue to use the bordered foam dressing to cover which I think is doing quite well. 4. She will continue with the normal shoe that she has unfortunately offloading shoe she could not utilize appropriately. If we do not see this continue to improve then I would recommend a total contact cast as the next way to try to offload. In the meantime she is going to try to stay off of it is much as possible. We will see patient back for reevaluation in 1 week here in the clinic. If anything worsens or changes patient will contact our office for additional recommendations. Electronic Signature(s) Signed: 09/01/2021 10:18:41 AM By: [name] Entered By: Worthy Keeler on 09/01/2021 10:18:41 Munch, Kasi Jerilynn Mages ([id number]) -------------------------------------------------------------------------------- SuperBill Details Patient Name: [name]. Date of Service: 09/01/2021 Medical Record Number: [id number] Patient Account Number: [id number] Date of Birth/Sex: 01-21-1975 (46 y.o. 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 Corns and callosities Facility Procedures CPT4 Code: 66060045 Description: 99774 - DEB SUBQ TISSUE 20 SQ CM/< Modifier: Quantity:  1 CPT4 Code: Description: ICD-10 Diagnosis Description L97.512 Non-pressure chronic ulcer of other part of right foot with fat layer ex Modifier: posed Quantity: Physician Procedures CPT4 Code: [id number] Description: 11042 - WC PHYS SUBQ TISS 20 SQ CM Modifier: Quantity: 1 CPT4 Code: Description: ICD-10 Diagnosis Description L97.512 Non-pressure chronic ulcer of other part of right foot with fat layer ex Modifier: posed Quantity: Electronic Signature(s) Signed: 09/01/2021 10:18:51 AM By: [name] Entered By: Worthy Keeler on  09/01/2021 10:18:51

## 2021-09-03 NOTE — Progress Notes (Signed)
Kelly, [name] ([id number]) Visit Report for 09/01/2021 Arrival Information Details Patient Name: [name], [name]. Date of Service: 09/01/2021 8:30 AM Medical Record Number: [id number] Patient Account Number: [id number] Date of Birth/Sex: 02/02/1975 (46 y.o. F) Treating RN: Alycia Rossetti Primary Care Hallis Meditz: [name] Other Clinician: Referring Zariya Minner: [name] Treating Rasheed Welty/Extender: [name] in Treatment: 1 Visit Information History Since Last Visit Added or deleted any medications: No Patient Arrived: Ambulatory Any new allergies or adverse reactions: No Arrival Time: 08:35 Had a fall or experienced change in No Accompanied By: self activities of daily living that may affect Transfer Assistance: None risk of falls: Patient Requires Transmission-Based Precautions: No Signs or symptoms of abuse/neglect since last visito No Patient Has Alerts: Yes Hospitalized since last visit: No Patient Alerts: ABI R 1.15 02/04/21 Has Dressing in Place as Prescribed: Yes ABI L 1.11 02/04/21 Pain Present Now: No Electronic Signature(s) Signed: 09/01/2021 1:54:04 PM By: Alycia Rossetti Entered By: Alycia Rossetti on 09/01/2021 13:54:04 Leja, Baleigh M. ([id number]) -------------------------------------------------------------------------------- Encounter Discharge Information Details Patient Name: [name]. Date of Service: 09/01/2021 8:30 AM Medical Record Number: [id number] Patient Account Number: [id number] Date of Birth/Sex: 03-28-1975 (46 y.o. F) Treating RN: Alycia Rossetti Primary Care Chenee Munns: [name] Other Clinician: Referring Paola Flynt: [name] Treating Jarek Longton/Extender: [name] in Treatment: 1 Encounter Discharge Information Items Post Procedure Vitals Discharge Condition: Stable Temperature (F): 98.4 Ambulatory Status: Ambulatory Pulse (bpm): 80 Discharge Destination: Home Respiratory Rate (breaths/min):  16 Transportation: Private Auto Blood Pressure (mmHg): 119/80 Accompanied By: self Schedule Follow-up Appointment: Yes Clinical Summary of Care: Electronic Signature(s) Signed: 09/01/2021 2:59:52 PM By: Alycia Rossetti Previous Signature: 09/01/2021 2:58:33 PM Version By: Alycia Rossetti Entered By: Alycia Rossetti on 09/01/2021 14:59:52 Rochon, Noorah M. ([id number]) -------------------------------------------------------------------------------- Lower Extremity Assessment Details Patient Name: [name]. Date of Service: 09/01/2021 8:30 AM Medical Record Number: [id number] Patient Account Number: [id number] Date of Birth/Sex: 09/25/1975 (46 y.o. F) Treating RN: Alycia Rossetti Primary Care Marg Macmaster: [name] Other Clinician: Referring Mattison Stuckey: [name] Treating Anitra Doxtater/Extender: [name] in Treatment: 1 Edema Assessment Assessed: [Left: No] [Right: No] [Left: Edema] [Right: :] Calf Left: Right: Point of Measurement: 30 cm From Medial Instep Ankle Left: Right: Point of Measurement: 10 cm From Medial Instep Vascular Assessment Pulses: Dorsalis Pedis Palpable: [Right:Yes] Electronic Signature(s) Signed: 09/01/2021 4:17:10 PM By: Alycia Rossetti Entered By: Alycia Rossetti on 09/01/2021 08:48:57 Veal, Sherline M. ([id number]) -------------------------------------------------------------------------------- Multi Wound Chart Details Patient Name: [name]. Date of Service: 09/01/2021 8:30 AM Medical Record Number: [id number] Patient Account Number: [id number] Date of Birth/Sex: Dec 26, 1975 (47 y.o. F) Treating RN: Alycia Rossetti Primary Care Rickiya Picariello: [name] Other Clinician: Referring Tryce Surratt: [name] Treating Bryen Hinderman/Extender: [name] in Treatment: 1 Vital Signs Height(in): 67 Pulse(bpm): 80 Weight(lbs): 153 Blood Pressure(mmHg): 119/80 Body Mass Index(BMI): 24 Temperature(F): 98.4 Respiratory Rate(breaths/min):  16 Photos: [N/A:N/A] Wound Location: Posterior Foot N/A N/A Wounding Event: Trauma N/A N/A Primary Etiology: Abscess N/A N/A Comorbid History: Anemia, Asthma, History of N/A N/A pressure wounds, Osteomyelitis, Neuropathy Date Acquired: 06/18/2021 N/A N/A Weeks of Treatment: 1 N/A N/A Wound Status: Open N/A N/A Wound Recurrence: No N/A N/A Measurements L x W x D (cm) 0.5x1x0.2 N/A N/A Area (cm) : 0.393 N/A N/A Volume (cm) : 0.079 N/A N/A % Reduction in Area: 44.40% N/A N/A % Reduction in Volume: 72.10% N/A N/A Starting Position 1 (o'clock): 11 Ending Position 1 (o'clock): 12 Maximum Distance 1 (cm): 0.3 Undermining: Yes N/A N/A Classification: Full Thickness Without  Exposed N/A N/A Support Structures Exudate Amount: None Present N/A N/A Granulation Amount: Large (67-100%) N/A N/A Granulation Quality: Red N/A N/A Necrotic Amount: None Present (0%) N/A N/A Exposed Structures: Fat Layer (Subcutaneous Tissue): N/A N/A Yes Fascia: No Tendon: No Muscle: No Joint: No Bone: No Epithelialization: None N/A N/A Assessment Notes: Callous surrounding wound N/A N/A Treatment Notes Electronic Signature(s) Signed: 09/01/2021 4:17:10 PM By: Jerene Pitch, Shandel M. ([id number]) Entered By: Alycia Rossetti on 09/01/2021 08:49:15 Poarch, Darrion M. ([id number]) -------------------------------------------------------------------------------- [place] Details Patient Name: [name]. Date of Service: 09/01/2021 8:30 AM Medical Record Number: [id number] Patient Account Number: [id number] Date of Birth/Sex: 21-Sep-1975 (46 y.o. F) Treating RN: Alycia Rossetti Primary Care Willene Holian: [name] Other Clinician: Referring Lailyn Appelbaum: [name] Treating Jameila Keeny/Extender: [name] in Treatment: 1 Active Inactive Wound/Skin Impairment Nursing Diagnoses: Knowledge deficit related to ulceration/compromised skin integrity Goals: Patient/caregiver  will verbalize understanding of skin care regimen Date Initiated: 08/20/2021 Target Resolution Date: 09/20/2021 Goal Status: Active Ulcer/skin breakdown will have a volume reduction of 30% by week 4 Date Initiated: 08/20/2021 Target Resolution Date: 09/20/2021 Goal Status: Active Ulcer/skin breakdown will have a volume reduction of 50% by week 8 Date Initiated: 08/20/2021 Target Resolution Date: 10/20/2021 Goal Status: Active Ulcer/skin breakdown will have a volume reduction of 80% by week 12 Date Initiated: 08/20/2021 Target Resolution Date: 11/20/2021 Goal Status: Active Ulcer/skin breakdown will heal within 14 weeks Date Initiated: 08/20/2021 Target Resolution Date: 12/20/2021 Goal Status: Active Interventions: Assess patient/caregiver ability to obtain necessary supplies Assess patient/caregiver ability to perform ulcer/skin care regimen upon admission and as needed Assess ulceration(s) every visit Notes: Electronic Signature(s) Signed: 09/01/2021 4:17:10 PM By: Alycia Rossetti Entered By: Alycia Rossetti on 09/01/2021 08:49:07 Catanese, Anne-Marie M. ([id number]) -------------------------------------------------------------------------------- Pain Assessment Details Patient Name: [name]. Date of Service: 09/01/2021 8:30 AM Medical Record Number: [id number] Patient Account Number: [id number] Date of Birth/Sex: 22-Apr-1975 (46 y.o. F) Treating RN: Alycia Rossetti Primary Care Asheton Viramontes: [name] Other Clinician: Referring Ashtin Rosner: [name] Treating Alyrica Thurow/Extender: [name] in Treatment: 1 Active Problems Location of Pain Severity and Description of Pain Patient Has Paino Yes Site Locations Rate the pain. Current Pain Level: 8 Character of Pain Describe the Pain: Throbbing Pain Management and Medication Current Pain Management: Electronic Signature(s) Signed: 09/01/2021 1:54:25 PM By: Alycia Rossetti Entered By: Alycia Rossetti on 09/01/2021 13:54:25 Hipps, Missy  M. ([id number]) -------------------------------------------------------------------------------- Patient/Caregiver Education Details Patient Name: [name]. Date of Service: 09/01/2021 8:30 AM Medical Record Number: [id number] Patient Account Number: [id number] Date of Birth/Gender: July 04, 1975 (46 y.o. F) Treating RN: Alycia Rossetti Primary Care Physician: [name] Other Clinician: Referring Physician: [name] Treating Physician/Extender: [name] in Treatment: 1 Education Assessment Education Provided To: Patient Education Topics Provided Wound Debridement: Handouts: Wound Debridement Methods: Demonstration, Explain/Verbal Responses: State content correctly Wound/Skin Impairment: Handouts: Caring for Your Ulcer Methods: Demonstration, Explain/Verbal Responses: State content correctly Electronic Signature(s) Signed: 09/01/2021 4:17:10 PM By: Alycia Rossetti Entered By: Alycia Rossetti on 09/01/2021 09:08:41 Strine, Terrell M. ([id number]) -------------------------------------------------------------------------------- Wound Assessment Details Patient Name: [name]. Date of Service: 09/01/2021 8:30 AM Medical Record Number: [id number] Patient Account Number: [id number] Date of Birth/Sex: 02/23/1975 (46 y.o. F) Treating RN: Alycia Rossetti Primary Care Robert Sunga: [name] Other Clinician: Referring Ermon Sagan: [name] Treating Sakiya Stepka/Extender: [name] in Treatment: 1 Wound Status Wound Number: 2 Primary Abscess Etiology: Wound Location: Posterior Foot Wound Status: Open Wounding Event: Trauma Comorbid Anemia, Asthma, History of pressure wounds, Date Acquired:  06/18/2021 History: Osteomyelitis, Neuropathy Weeks Of Treatment: 1 Clustered Wound: No Photos Wound Measurements Length: (cm) 0.5 Width: (cm) 1 Depth: (cm) 0.2 Area: (cm) 0.393 Volume: (cm) 0.079 % Reduction in Area: 44.4% % Reduction in Volume:  72.1% Epithelialization: None Undermining: Yes Starting Position (o'clock): 11 Ending Position (o'clock): 12 Maximum Distance: (cm) 0.3 Wound Description Classification: Full Thickness Without Exposed Support Structures Exudate Amount: None Present Foul Odor After Cleansing: No Slough/Fibrino No Wound Bed Granulation Amount: Large (67-100%) Exposed Structure Granulation Quality: Red Fascia Exposed: No Necrotic Amount: None Present (0%) Fat Layer (Subcutaneous Tissue) Exposed: Yes Tendon Exposed: No Muscle Exposed: No Joint Exposed: No Bone Exposed: No Assessment Notes Callous surrounding wound Treatment Notes Wound #2 (Foot) Wound Laterality: Posterior Cleanser Traister, Lilyanne M. ([id number]) [facility] Ancillary Kit - 15 Day Supply Discharge Instruction: Use supplies as instructed; Kit contains: (15) Saline Bullets; (15) 3x3 Gauze; 15 pr Gloves Soap and Water Discharge Instruction: Gently cleanse wound with antibacterial soap, rinse and pat dry prior to dressing wounds Peri-Wound Care Topical Gentamicin Discharge Instruction: Apply as directed by Rhealyn Cullen. Primary Dressing Hydrofera Blue Ready Transfer Foam, 4x5 (in/in) Discharge Instruction: Apply Hydrofera Blue Ready to wound bed as directed Secondary Dressing (BORDER) Zetuvit Plus SILICONE BORDER Dressing 4x4 (in/in) Discharge Instruction: Please do not put silicone bordered dressings under wraps. Use non-bordered dressing only. Secured With Compression Wrap Compression Stockings Add-Ons Electronic Signature(s) Signed: 09/01/2021 4:17:10 PM By: Alycia Rossetti Entered By: Alycia Rossetti on 09/01/2021 08:47:55 Perotti, Kristl M. ([id number]) -------------------------------------------------------------------------------- Vitals Details Patient Name: [name]. Date of Service: 09/01/2021 8:30 AM Medical Record Number: [id number] Patient Account Number: [id number] Date of Birth/Sex: May 01, 1975 (46 y.o. F) Treating RN:  Alycia Rossetti Primary Care Yoselyn Mcglade: [name] Other Clinician: Referring [name] Miyoshi: [name] Treating Jann Ra/Extender: [name] in Treatment: 1 Vital Signs Time Taken: 09:35 Temperature (F): 98.4 Height (in): 67 Pulse (bpm): 80 Weight (lbs): 153 Respiratory Rate (breaths/min): 16 Body Mass Index (BMI): 24 Blood Pressure (mmHg): 119/80 Reference Range: 80 - 120 mg / dl Electronic Signature(s) Signed: 09/01/2021 1:54:14 PM By: Alycia Rossetti Entered By: Alycia Rossetti on 09/01/2021 13:54:14

## 2021-09-07 ENCOUNTER — Encounter: Payer: Medicare PPO | Admitting: Physician Assistant

## 2021-09-07 DIAGNOSIS — G600 Hereditary motor and sensory neuropathy: Secondary | ICD-10-CM | POA: Diagnosis not present

## 2021-09-07 DIAGNOSIS — G7100 Muscular dystrophy, unspecified: Secondary | ICD-10-CM | POA: Diagnosis not present

## 2021-09-07 DIAGNOSIS — M86671 Other chronic osteomyelitis, right ankle and foot: Secondary | ICD-10-CM | POA: Diagnosis not present

## 2021-09-07 DIAGNOSIS — L84 Corns and callosities: Secondary | ICD-10-CM | POA: Diagnosis not present

## 2021-09-07 DIAGNOSIS — L02611 Cutaneous abscess of right foot: Secondary | ICD-10-CM | POA: Diagnosis not present

## 2021-09-07 DIAGNOSIS — L97512 Non-pressure chronic ulcer of other part of right foot with fat layer exposed: Secondary | ICD-10-CM | POA: Diagnosis not present

## 2021-09-07 NOTE — Progress Notes (Signed)
NINAMARIE, KEEL ([id number]) Visit Report for 09/07/2021 Arrival Information Details Patient Name: [name], [name]. Date of Service: 09/07/2021 10:30 AM Medical Record Number: [id number] Patient Account Number: [id number] Date of Birth/Sex: 07/12/1975 (46 y.o. F) Treating RN: [name] Primary Care Anelisse Jacobson: [name] Other Clinician: [name] Referring Isamar Nazir: [name] Treating Celese Banner/Extender: [name] in Treatment: 2 Visit Information History Since Last Visit All ordered tests and consults were completed: No Patient Arrived: Ambulatory Added or deleted any medications: No Arrival Time: 10:37 Any new allergies or adverse reactions: No Transfer Assistance: None Had a fall or experienced change in No Patient Requires Transmission-Based Precautions: No activities of daily living that may affect Patient Has Alerts: Yes risk of falls: Patient Alerts: ABI R 1.15 02/04/21 Hospitalized since last visit: No ABI L 1.11 02/04/21 Pain Present Now: Yes Electronic Signature(s) Unsigned Entered By: [name] on 09/07/2021 10:37:59 Signature(s): Date(s): [name] ([id number]) -------------------------------------------------------------------------------- Clinic Level of Care Assessment Details Patient Name: [name], [name]. Date of Service: 09/07/2021 10:30 AM Medical Record Number: [id number] Patient Account Number: [id number] Date of Birth/Sex: Oct 24, 1975 (46 y.o. F) Treating RN: [name] Primary Care Masiah Woody: [name] Other Clinician: [name] Referring Ahniya Mitchum: [name] Treating Hazleigh Mccleave/Extender: [name] in Treatment: 2 Clinic Level of Care Assessment Items TOOL 1 Quantity Score []  - Use when EandM and Procedure is performed on INITIAL visit 0 ASSESSMENTS - Nursing Assessment / Reassessment []  - General Physical Exam (combine w/ comprehensive assessment (listed just below) when performed  on new 0 pt. evals) []  - 0 Comprehensive Assessment (HX, ROS, Risk Assessments, Wounds Hx, etc.) ASSESSMENTS - Wound and Skin Assessment / Reassessment []  - Dermatologic / Skin Assessment (not related to wound area) 0 ASSESSMENTS - Ostomy and/or Continence Assessment and Care []  - Incontinence Assessment and Management 0 []  - 0 Ostomy Care Assessment and Management (repouching, etc.) PROCESS - Coordination of Care []  - Simple Patient / Family Education for ongoing care 0 []  - 0 Complex (extensive) Patient / Family Education for ongoing care []  - 0 Staff obtains Consents, Records, Test Results / Process Orders []  - 0 Staff telephones HHA, Nursing Homes / Clarify orders / etc []  - 0 Routine Transfer to another Facility (non-emergent condition) []  - 0 Routine Hospital Admission (non-emergent condition) []  - 0 New Admissions / Biomedical engineer / Ordering NPWT, Apligraf, etc. []  - 0 Emergency Hospital Admission (emergent condition) PROCESS - Special Needs []  - Pediatric / Minor Patient Management 0 []  - 0 Isolation Patient Management []  - 0 Hearing / Language / Visual special needs []  - 0 Assessment of Community assistance (transportation, D/C planning, etc.) []  - 0 Additional assistance / Altered mentation []  - 0 Support Surface(s) Assessment (bed, cushion, seat, etc.) INTERVENTIONS - Miscellaneous []  - External ear exam 0 []  - 0 Patient Transfer (multiple staff / Civil Service fast streamer / Similar devices) []  - 0 Simple Staple / Suture removal (25 or less) []  - 0 Complex Staple / Suture removal (26 or more) []  - 0 Hypo/Hyperglycemic Management (do not check if billed separately) []  - 0 Ankle / Brachial Index (ABI) - do not check if billed separately Has the patient been seen at the hospital within the last three years: Yes Total Score: 0 Level Of Care: ____ [name] ([id number]) Electronic Signature(s) Unsigned Entered By: [name] on 09/07/2021  10:59:33 Signature(s): Date(s): [name] ([id number]) -------------------------------------------------------------------------------- Encounter Discharge Information Details Patient Name: [name]. Date of Service: 09/07/2021 10:30  AM Medical Record Number: [id number] Patient Account Number: [id number] Date of Birth/Sex: 01/08/1976 (46 y.o. F) Treating RN: [name] Primary Care Shirlena Brinegar: [name] Other Clinician: [name] Referring Scotty Pinder: [name] Treating Xolani Degracia/Extender: [name] in Treatment: 2 Encounter Discharge Information Items Post Procedure Vitals Discharge Condition: Stable Temperature (F): 98.6 Ambulatory Status: Ambulatory Pulse (bpm): 78 Discharge Destination: Home Respiratory Rate (breaths/min): 16 Transportation: Private Auto Blood Pressure (mmHg): 127/80 Accompanied By: self Schedule Follow-up Appointment: Yes Clinical Summary of Care: Electronic Signature(s) Unsigned Entered ByMassie Kluver on 09/07/2021 11:05:53 Signature(s): Date(s): [name] ([id number]) -------------------------------------------------------------------------------- Lower Extremity Assessment Details Patient Name: [name], [name]. Date of Service: 09/07/2021 10:30 AM Medical Record Number: [id number] Patient Account Number: [id number] Date of Birth/Sex: 15-Feb-1975 (46 y.o. F) Treating RN: [name] Primary Care Earon Rivest: [name] Other Clinician: [name] Referring Samatha Anspach: [name] Treating Romy Ipock/Extender: [name] in Treatment: 2 Edema Assessment Assessed: [Left: No] [Right: Yes] Edema: [Left: N] [Right: o] Calf Left: Right: Point of Measurement: 30 cm From Medial Instep Ankle Left: Right: Point of Measurement: 10 cm From Medial Instep Vascular Assessment Pulses: Dorsalis Pedis Palpable: [Right:Yes] Posterior Tibial Palpable: [Right:Yes] Electronic  Signature(s) Unsigned Entered ByMassie Kluver on 09/07/2021 10:48:34 Signature(s): Date(s): Wickey, Jaice Jerilynn Mages ([id number]) -------------------------------------------------------------------------------- Multi Wound Chart Details Patient Name: [name]. Date of Service: 09/07/2021 10:30 AM Medical Record Number: [id number] Patient Account Number: [id number] Date of Birth/Sex: 01/30/1975 (46 y.o. F) Treating RN: [name] Primary Care Jyla Hopf: [name] Other Clinician: [name] Referring Journe Hallmark: [name] Treating Shwanda Soltis/Extender: [name] in Treatment: 2 Vital Signs Height(in): 81 Pulse(bpm): 42 Weight(lbs): 153 Blood Pressure(mmHg): 127/80 Body Mass Index(BMI): 24 Temperature(F): 98.6 Respiratory Rate(breaths/min): 16 Photos: [N/A:N/A] Wound Location: Posterior Foot N/A N/A Wounding Event: Trauma N/A N/A Primary Etiology: Abscess N/A N/A Comorbid History: Anemia, Asthma, History of N/A N/A pressure wounds, Osteomyelitis, Neuropathy Date Acquired: 06/18/2021 N/A N/A Weeks of Treatment: 2 N/A N/A Wound Status: Open N/A N/A Wound Recurrence: No N/A N/A Measurements L x W x D (cm) 0.5x0.7x0.2 N/A N/A Area (cm) : 0.275 N/A N/A Volume (cm) : 0.055 N/A N/A % Reduction in Area: 61.10% N/A N/A % Reduction in Volume: 80.60% N/A N/A Classification: Full Thickness Without Exposed N/A N/A Support Structures Exudate Amount: None Present N/A N/A Granulation Amount: Large (67-100%) N/A N/A Granulation Quality: Pink N/A N/A Necrotic Amount: Small (1-33%) N/A N/A Exposed Structures: Fat Layer (Subcutaneous Tissue): N/A N/A Yes Fascia: No Tendon: No Muscle: No Joint: No Bone: No Epithelialization: None N/A N/A Treatment Notes Electronic Signature(s) Unsigned Entered ByMassie Kluver on 09/07/2021 10:48:44 Signature(s): Date(s): [name]  ([id number]) -------------------------------------------------------------------------------- [place] Details Patient Name: [name], [name]. Date of Service: 09/07/2021 10:30 AM Medical Record Number: [id number] Patient Account Number: [id number] Date of Birth/Sex: [birth year] (46 y.o. F) Treating RN: [name] Primary Care Brenten Janney: [name] Other Clinician: [name] Referring Mikela Senn: [name] Treating Dameshia Seybold/Extender: [name] in Treatment: 2 Active Inactive Wound/Skin Impairment Nursing Diagnoses: Knowledge deficit related to ulceration/compromised skin integrity Goals: Patient/caregiver will verbalize understanding of skin care regimen Date Initiated: 08/20/2021 Target Resolution Date: 09/20/2021 Goal Status: Active Ulcer/skin breakdown will have a volume reduction of 30% by week 4 Date Initiated: 08/20/2021 Target Resolution Date: 09/20/2021 Goal Status: Active Ulcer/skin breakdown will have a volume reduction of 50% by week 8 Date Initiated: 08/20/2021 Target Resolution Date: 10/20/2021 Goal Status: Active Ulcer/skin breakdown will have a volume reduction of 80% by week 12  Date Initiated: 08/20/2021 Target Resolution Date: 11/20/2021 Goal Status: Active Ulcer/skin breakdown will heal within 14 weeks Date Initiated: 08/20/2021 Target Resolution Date: 12/20/2021 Goal Status: Active Interventions: Assess patient/caregiver ability to obtain necessary supplies Assess patient/caregiver ability to perform ulcer/skin care regimen upon admission and as needed Assess ulceration(s) every visit Notes: Electronic Signature(s) Unsigned Entered By: [name] on 09/07/2021 10:48:38 Signature(s): Date(s): Wolbert, Devinne Jerilynn Mages ([id number]) -------------------------------------------------------------------------------- Pain Assessment Details Patient Name: [name], [name]. Date of Service: 09/07/2021 10:30 AM Medical Record Number:  [id number] Patient Account Number: [id number] Date of Birth/Sex: 1975/08/06 (46 y.o. F) Treating RN: [name] Primary Care Dawan Farney: [name] Other Clinician: [name] Referring Mercy Leppla: [name] Treating Shawnee Gambone/Extender: [name] in Treatment: 2 Active Problems Location of Pain Severity and Description of Pain Patient Has Paino Yes Site Locations Pain Location: Pain in Ulcers Duration of the Pain. Constant / Intermittento Intermittent Rate the pain. Current Pain Level: 6 Worst Pain Level: 8 Character of Pain Describe the Pain: Burning, Throbbing Pain Management and Medication Current Pain Management: Medication: Yes Rest: Yes How does your wound impact your activities of daily livingo Sleep: Yes Appetite: No Electronic Signature(s) Unsigned Entered ByMassie Kluver on 09/07/2021 10:41:46 Signature(s): Date(s): [name] ([id number]) -------------------------------------------------------------------------------- Patient/Caregiver Education Details Patient Name: [name]. Date of Service: 09/07/2021 10:30 AM Medical Record Number: [id number] Patient Account Number: [id number] Date of Birth/Gender: 10/31/1975 (46 y.o. F) Treating RN: [name] Primary Care Physician: [name] Other Clinician: [name] Referring Physician: [name] Treating Physician/Extender: [name] in Treatment: 2 Education Assessment Education Provided To: Patient Education Topics Provided Wound/Skin Impairment: Handouts: Other: continue wound care as directed Methods: Explain/Verbal Responses: State content correctly Electronic Signature(s) Unsigned Entered By: [name] on 09/07/2021 10:59:56 Signature(s): Date(s): Blankley, Rina M. ([id number]) -------------------------------------------------------------------------------- Wound Assessment Details Patient Name: [name], [name]. Date of  Service: 09/07/2021 10:30 AM Medical Record Number: [id number] Patient Account Number: [id number] Date of Birth/Sex: 01-Sep-1975 (46 y.o. F) Treating RN: [name] Primary Care Antar Milks: [name] Other Clinician: [name] Referring Yerlin Gasparyan: [name] Treating Liisa Picone/Extender: [name] in Treatment: 2 Wound Status Wound Number: 2 Primary Abscess Etiology: Wound Location: Posterior Foot Wound Status: Open Wounding Event: Trauma Comorbid Anemia, Asthma, History of pressure wounds, Date Acquired: 06/18/2021 History: Osteomyelitis, Neuropathy Weeks Of Treatment: 2 Clustered Wound: No Photos Wound Measurements Length: (cm) 0.5 Width: (cm) 0.7 Depth: (cm) 0.2 Area: (cm) 0.275 Volume: (cm) 0.055 % Reduction in Area: 61.1% % Reduction in Volume: 80.6% Epithelialization: None Tunneling: No Undermining: No Wound Description Classification: Full Thickness Without Exposed Support Structures Exudate Amount: None Present Foul Odor After Cleansing: No Slough/Fibrino No Wound Bed Granulation Amount: Large (67-100%) Exposed Structure Granulation Quality: Pink Fascia Exposed: No Necrotic Amount: Small (1-33%) Fat Layer (Subcutaneous Tissue) Exposed: Yes Necrotic Quality: Adherent Slough Tendon Exposed: No Muscle Exposed: No Joint Exposed: No Bone Exposed: No Treatment Notes Wound #2 (Foot) Wound Laterality: Posterior Cleanser [place]ram Ancillary Kit - 15 Day Supply Discharge Instruction: Use supplies as instructed; Kit contains: (15) Saline Bullets; (15) 3x3 Gauze; 15 pr Gloves Soap and Water Discharge Instruction: Gently cleanse wound with antibacterial soap, rinse and pat dry prior to dressing wounds Judice, Jenet M. ([id number]) Peri-Wound Care Topical Gentamicin Discharge Instruction: Apply as directed by Marisa Hage. Primary Dressing Hydrofera Blue Ready Transfer Foam, 4x5 (in/in) Discharge Instruction: Apply Hydrofera Blue Ready to wound bed  as directed Secondary Dressing (BORDER) Zetuvit Plus SILICONE BORDER Dressing 4x4 (in/in) Discharge Instruction: Please do not  put silicone bordered dressings under wraps. Use non-bordered dressing only. Secured With Compression Wrap Compression Stockings Add-Ons Electronic Signature(s) Unsigned Entered By: [name] on 09/07/2021 10:47:25 Signature(s): Date(s): Golberg, Anari Jerilynn Mages ([id number]) -------------------------------------------------------------------------------- Vitals Details Patient Name: [name]. Date of Service: 09/07/2021 10:30 AM Medical Record Number: [id number] Patient Account Number: [id number] Date of Birth/Sex: 11/06/1975 (46 y.o. F) Treating RN: [name] Primary Care Vadis Slabach: [name] Other Clinician: [name] Referring Bricelyn Freestone: [name] Treating Adreana Coull/Extender: [name] in Treatment: 2 Vital Signs Time Taken: 10:41 Temperature (F): 98.6 Height (in): 67 Pulse (bpm): 78 Weight (lbs): 153 Respiratory Rate (breaths/min): 16 Body Mass Index (BMI): 24 Blood Pressure (mmHg): 127/80 Reference Range: 80 - 120 mg / dl Electronic Signature(s) Unsigned Entered ByMassie Kluver on 09/07/2021 10:41:32 Signature(s): Date(s):

## 2021-09-07 NOTE — Progress Notes (Signed)
[name], [name] ([id number]) Visit Report for 09/07/2021 Chief Complaint Document Details Patient Name: [name], [name]. Date of Service: 09/07/2021 10:30 AM Medical Record Number: [id number] Patient Account Number: [id number] Date of Birth/Sex: 12-19-1975 (46 y.o. F) Treating RN: [name] Primary Care Provider: [name] Other Clinician: [name] Referring Provider: [name] Treating Provider/Extender: [name] in Treatment: 2 Information Obtained from: Patient Chief Complaint Right foot ulcer Electronic Signature(s) Signed: 09/07/2021 10:37:01 AM By: [name] Entered By: Worthy Keeler on 09/07/2021 10:37:01 [name] ([id number]) -------------------------------------------------------------------------------- Debridement Details Patient Name: [name]. Date of Service: 09/07/2021 10:30 AM Medical Record Number: [id number] Patient Account Number: [id number] Date of Birth/Sex: August 08, 1975 (46 y.o. F) Treating RN: [name] Primary Care Provider: [name] Other Clinician: [name] Referring Provider: [name] Treating Provider/Extender: [name] in Treatment: 2 Debridement Performed for Wound #2 Posterior Foot Assessment: Performed By: Physician Kelly Sams., [name] Debridement Type: Debridement Level of Consciousness (Pre- Awake and Alert procedure): Pre-procedure Verification/Time Out Yes - 10:54 Taken: Start Time: 10:54 Total Area Debrided (L x W): 1 (cm) x 1 (cm) = 1 (cm) Tissue and other material Viable, Non-Viable, Callus, Slough, Subcutaneous, Slough debrided: Level: Skin/Subcutaneous Tissue Debridement Description: Excisional Instrument: Curette Bleeding: Minimum Hemostasis Achieved: Pressure End Time: 10:58 Response to Treatment: Procedure was tolerated well Level of Consciousness (Post- Awake and Alert procedure): Post Debridement Measurements of Total  Wound Length: (cm) 0.6 Width: (cm) 0.8 Depth: (cm) 0.2 Volume: (cm) 0.075 Character of Wound/Ulcer Post Debridement: Stable Post Procedure Diagnosis Same as Pre-procedure Electronic Signature(s) Unsigned Entered ByMassie Kluver on 09/07/2021 10:59:17 Signature(s): Date(s): [name] ([id number]) -------------------------------------------------------------------------------- HPI Details Patient Name: [name]. Date of Service: 09/07/2021 10:30 AM Medical Record Number: [id number] Patient Account Number: [id number] Date of Birth/Sex: 15-May-1975 (46 y.o. F) Treating RN: [name] Primary Care Provider: [name] Other Clinician: [name] Referring Provider: [name] Treating Provider/Extender: [name] in Treatment: 2 History of Present Illness Location: right first metatarsal head plantar aspect Quality: Patient reports experiencing a dull pain to affected area(s). Severity: Patient states wound are getting worse. Duration: Patient has had the wound for > 2 months prior to seeking treatment at the wound center Timing: Pain in wound is Intermittent (comes and goes Context: The wound appeared gradually over time Modifying Factors: Other treatment(s) tried include:pain clinic for her narcotics Associated Signs and Symptoms: Patient reports having difficulty standing for long periods. HPI Description: 46 year old patient sent to [name] by her PCP 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 Her notes were reviewed and there was extensive history of opioid treatment and management by the [facility] pain clinic from where she has been terminated. X-ray of the right foot foot was done on 03/13/2015 and was suggestive of active infection and possible osteomyelitis. her past  surgical history includes amputation of the left big toe in 2011 and amputation of the right big toe in 2015. She was sitting the wound clinic at [facility] and we will try and obtain these notes. She is a smoker and smokes about 15 cigarettes a day. 06/12/2015 - x-ray of the right foot -- IMPRESSION:1. Soft tissue swelling of the plantar surface of the foot. No underlying acute abnormality. 2. Amputation deformity again of the right great toe. Old healed bony  deformity noted the right second metatarsal. No acute bony abnormality identified. We have not received any reports from the [facility] hospital yet. She continues to smoke at least 10 cigarettes a day 06/19/2015 -- she has increasing pain in the right foot and also has had some low-grade fever. Her vascular test is scheduled for tomorrow and her MRIs not till next week. Readmission: 08-20-2021 upon evaluation today patient presents for initial inspection here in our clinic although she has been seen that was back in 2017. At that time she was having a issue with her right foot. With that being said upon inspection today she is actually having issues with the right foot as well on the medial aspect where she has some breakdown due to what I am assuming is pressure to the foot location. 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 I did review her culture report which showed evidence of bacteria including hemolytic Streptococcus group A as well as Staphylococcus aureus. The patient is currently on doxycycline and tells me that this is getting much better. Topical gentamicin is also being utilized. Also did review her arterial studies which show that she has on the right and ABI of 1.15 which is triphasic and appears to be excellent there is no significant peripheral artery  disease noted the left is also. With ABI of 1.11 and a good report in that regard as well. Lastly I did also review her MRI of the right forefoot which was performed on 07-06-2021. This shows that she does have mild subcortical marrow edema in the stump of the first metatarsal concerning for osteomyelitis. There is also a malunited fracture of the second metatarsal. The patient has bone marrow edema in the base of the second third fourth and fifth metatarsals concerning for stress reaction obviously this infection is going to be an issue for [name] here as well she needs to be treated appropriately and I do believe that the doxycycline is a good option right now although I may need to #1 extend this and #2 plan for even altering or adding to the treatment regimen depending on how things progress. Patient voiced understanding.Marland Kitchen 09-01-2021 upon evaluation today patient appears to be doing excellent in regard to her wound. She is actually showing signs of excellent improvement which is great news. Fortunately I do not see any signs of active infection locally or systemically at this point which is great news and overall I am extremely pleased with where things stand today. I do not see any evidence of active infection locally or systemically. 09-07-2021 upon evaluation today patient appears to be doing well with regard to her foot ulcer. This is actually showing signs of improvement which is great news. Fortunately there does not appear to be any signs of active infection locally or systemically at this time which is as well. Electronic Signature(s) Signed: 09/07/2021 11:48:12 AM By: [name] Entered By: Worthy Keeler on 09/07/2021 11:48:12 Robben, Concettina [name] ([id number]) -------------------------------------------------------------------------------- Physical Exam Details Patient Name: [name]. Date of Service: 09/07/2021 10:30 AM Medical Record Number: [id number] Patient Account  Number: [id number] Date of Birth/Sex: August 12, 1975 (46 y.o. F) Treating RN: [name] Primary Care Provider: [name] Other Clinician: [name] Referring Provider: [name] Treating Provider/Extender: [name] in Treatment: 2 Constitutional Well-nourished and well-hydrated in no  acute distress. Respiratory normal breathing without difficulty. Psychiatric this patient is able to make decisions and demonstrates good insight into disease process. Alert and Oriented x 3. pleasant and cooperative. Notes Upon inspection patient's wound bed actually showed signs of good granulation and epithelization at this point. Fortunately there does not appear to be any evidence of infection locally or systemically at this time which is great news and overall I am extremely pleased with where things stand currently. Electronic Signature(s) Signed: 09/07/2021 11:48:28 AM By: [name] Entered By: Worthy Keeler on 09/07/2021 11:48:27 Aybar, Sheyanne [name] ([id number]) -------------------------------------------------------------------------------- Physician Orders Details Patient Name: [name]. Date of Service: 09/07/2021 10:30 AM Medical Record Number: [id number] Patient Account Number: [id number] Date of Birth/Sex: 09-24-1975 (46 y.o. 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 Bathing/ Shower/ Hygiene o May  shower with wound dressing protected with water repellent cover or cast protector. o No tub bath. Anesthetic (Use 'Patient Medications' Section for Anesthetic Order Entry) o Lidocaine applied to wound bed Edema Control - Lymphedema / Segmental Compressive Device / Other o Elevate, Exercise Daily and Avoid Standing for Long Periods of Time. o Elevate legs to the level of the heart and pump ankles as often as possible o Elevate leg(s) parallel to the floor when sitting. Medications-Please add to medication list. Wound #2 Posterior Foot o Take one 511m Tylenol (Acetaminophen) and one 2051mMotrin (Ibuprofen) every 6 hours for pain. Do not take ibuprofen if you are on blood thinners or have stomach ulcers. - take every 4 hours while awake Wound Treatment Wound #2 - Foot Wound Laterality: Posterior Cleanser: Byram Ancillary Kit - 15 Day Supply (Generic) 3 x Per Day/30 Days Discharge Instructions: Use supplies as instructed; Kit contains: (15) Saline Bullets; (15) 3x3 Gauze; 15 pr Gloves Cleanser: Soap and Water 3 x Per Day/30 Days Discharge Instructions: Gently cleanse wound with antibacterial soap, rinse and pat dry prior to dressing wounds Topical: Gentamicin 3 x Per Day/30 Days Discharge Instructions: Apply as directed by provider. Primary Dressing: Hydrofera Blue Ready Transfer Foam, 4x5 (in/in) (Generic) 3 x Per Day/30 Days Discharge Instructions: Apply Hydrofera Blue Ready to wound bed as directed Secondary Dressing: (BORDER) Zetuvit Plus SILICONE BORDER Dressing 4x4 (in/in) (Generic) 3 x Per Day/30 Days Discharge Instructions: Please do not put silicone bordered dressings under wraps. Use non-bordered dressing only. Electronic Signature(s) Unsigned Entered By: VeMassie Kluvern 09/07/2021 11:02:24 Signature(s): Date(s): Beehler, Kearah M. ([id number], Carys M. ([id number]-------------------------------------------------------------------------------- Problem  List Details Patient Name: NEHall BusingDate of Service: 09/07/2021 10:30 AM Medical Record Number: [id number] Account Number: [id number] of Birth/Sex: [birth year].o. F) Treating RN: EpCarlene Coriarimary Care Provider: SoTommi Rumpsther Clinician: VeMassie Kluvereferring Provider: SoTommi Rumpsreating Provider/Extender: StSkipper Clichen Treatment: 2 Active Problems ICD-10 Encounter Code Description Active Date MDM Diagnosis [id number] chronic osteomyelitis, right ankle and foot 08/20/2021 No Yes L97.512 Non-pressure chronic ulcer of other part of right foot with fat layer 08/20/2021 No Yes exposed G71.00 Muscular dystrophy, unspecified 08/20/2021 No Yes G60.0  Hereditary motor and sensory neuropathy 08/20/2021 No Yes L84 Corns and callosities 08/20/2021 No Yes Inactive Problems Resolved Problems Electronic Signature(s) Signed: 09/07/2021 10:36:57 AM By: [name] Entered By: Worthy Keeler on 09/07/2021 10:36:57 Mcneeley, Iyla M. ([id number]) -------------------------------------------------------------------------------- Progress Note Details Patient Name: [name]. Date of Service: 09/07/2021 10:30 AM Medical Record Number: [id number] Patient Account Number: [id number] Date of Birth/Sex: [birth year] (46 y.o. F) Treating RN: [name] Primary Care Provider: [name] Other Clinician: [name] Referring Provider: [name] Treating Provider/Extender: [name] in Treatment: 2 Subjective Chief Complaint Information obtained from Patient Right foot ulcer History of Present Illness (HPI) The following HPI elements were documented for the patient's wound: Location: right first metatarsal head plantar aspect Quality: Patient reports experiencing a dull pain to affected area(s). Severity: Patient states wound are getting worse. Duration: Patient has had the wound for > 2 months prior to seeking  treatment at the wound center Timing: Pain in wound is Intermittent (comes and goes Context: The wound appeared gradually over time Modifying Factors: Other treatment(s) tried include:pain clinic for her narcotics Associated Signs and Symptoms: Patient reports having difficulty standing for long periods. 46 year old patient sent to [name] by her PCP 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 Her notes were reviewed and there was extensive history of opioid treatment and management by the [facility] pain clinic from where she has been terminated. X-ray of the right foot foot was done on 03/13/2015 and was suggestive of active infection and possible osteomyelitis. her past surgical history includes amputation of the left big toe in 2011 and amputation of the right big toe in 2015. She was sitting the wound clinic at [facility] and we will try and obtain these notes. She is a smoker and smokes about 15 cigarettes a day. 06/12/2015 - x-ray of the right foot -- IMPRESSION:1. Soft tissue swelling of the plantar surface of the foot. No underlying acute abnormality. 2. Amputation deformity again of the right great toe. Old healed bony deformity noted the right second metatarsal. No acute bony abnormality identified. We have not received any reports from the [facility] hospital yet. She continues to smoke at least 10 cigarettes a day 06/19/2015 -- she has increasing pain in the right foot and also has had some low-grade fever. Her vascular test is scheduled for tomorrow and her MRIs not till next week. Readmission: 08-20-2021 upon evaluation today patient presents for initial inspection here in our clinic although she has been seen that was back in 2017. At that time she was having a issue with her right foot. With that being said upon inspection today  she is actually having issues with the right foot as well on the medial aspect where she has some breakdown due to what I am assuming is pressure to the foot location. 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 I did review her culture report which showed evidence of bacteria including hemolytic Streptococcus group A as well as Staphylococcus aureus. The patient is currently on doxycycline and tells me that this is getting much better. Topical gentamicin is also being utilized. Also did review her arterial studies which show that  she has on the right and ABI of 1.15 which is triphasic and appears to be excellent there is no significant peripheral artery disease noted the left is also. With ABI of 1.11 and a good report in that regard as well. Lastly I did also review her MRI of the right forefoot which was performed on 07-06-2021. This shows that she does have mild subcortical marrow edema in the stump of the first metatarsal concerning for osteomyelitis. There is also a malunited fracture of the second metatarsal. The patient has bone marrow edema in the base of the second third fourth and fifth metatarsals concerning for stress reaction obviously this infection is going to be an issue for [name] here as well she needs to be treated appropriately and I do believe that the doxycycline is a good option right now although I may need to #1 extend this and #2 plan for even altering or adding to the treatment regimen depending on how things progress. Patient voiced understanding.Marland Kitchen 09-01-2021 upon evaluation today patient appears to be doing excellent in regard to her wound. She is actually showing signs of excellent improvement which is great news. Fortunately I do not see any signs of active infection locally or systemically at this point  which is great news and overall I am extremely pleased with where things stand today. I do not see any evidence of active infection locally or systemically. 09-07-2021 upon evaluation today patient appears to be doing well with regard to her foot ulcer. This is actually showing signs of improvement which is great news. Fortunately there does not appear to be any signs of active infection locally or systemically at this time which is as well. FATE, GALANTI ([id number]) Objective Constitutional Well-nourished and well-hydrated in no acute distress. Vitals Time Taken: 10:41 AM, Height: 67 in, Weight: 153 lbs, BMI: 24, Temperature: 98.6 F, Pulse: 78 bpm, Respiratory Rate: 16 breaths/min, Blood Pressure: 127/80 mmHg. Respiratory normal breathing without difficulty. Psychiatric this patient is able to make decisions and demonstrates good insight into disease process. Alert and Oriented x 3. pleasant and cooperative. General Notes: Upon inspection patient's wound bed actually showed signs of good granulation and epithelization at this point. Fortunately there does not appear to be any evidence of infection locally or systemically at this time which is great news and overall I am extremely pleased with where things stand currently. Integumentary (Hair, Skin) Wound #2 status is Open. Original cause of wound was Trauma. The date acquired was: 06/18/2021. The wound has been in treatment 2 weeks. The wound is located on the Posterior Foot. The wound measures 0.5cm length x 0.7cm width x 0.2cm depth; 0.275cm^2 area and 0.055cm^3 volume. There is Fat Layer (Subcutaneous Tissue) exposed. There is no tunneling or undermining noted. There is a none present amount of drainage noted. There is large (67-100%) pink granulation within the wound bed. There is a small (1-33%) amount of necrotic tissue within the wound bed including Adherent Slough. Assessment Active Problems ICD-10 Other chronic osteomyelitis,  right ankle and foot Non-pressure chronic ulcer of other part of right foot with fat layer exposed Muscular dystrophy, unspecified Hereditary motor and sensory neuropathy Corns and callosities Procedures Wound #2 Pre-procedure diagnosis of Wound #2 is an Abscess located on the Posterior Foot . There was a Excisional Skin/Subcutaneous Tissue Debridement with a total area of 1 sq cm performed by Kelly Sams., [name]. With the following instrument(s): Curette to remove Viable and Non-Viable tissue/material. Material removed includes Callus, Subcutaneous Tissue, and  Slough. A time out was conducted at 10:54, prior to the start of the procedure. A Minimum amount of bleeding was controlled with Pressure. The procedure was tolerated well. Post Debridement Measurements: 0.6cm length x 0.8cm width x 0.2cm depth; 0.075cm^3 volume. Character of Wound/Ulcer Post Debridement is stable. Post procedure Diagnosis Wound #2: Same as Pre-Procedure Plan Follow-up Appointments: Return Appointment in 1 week. Bathing/ Shower/ Hygiene: May shower with wound dressing protected with water repellent cover or cast protector. No tub bath. Anesthetic (Use 'Patient Medications' Section for Anesthetic Order Entry): Lidocaine applied to wound bed Edema Control - Lymphedema / Segmental Compressive Device / Other: Elevate, Exercise Daily and Avoid Standing for Long Periods of Time. Elevate legs to the level of the heart and pump ankles as often as possible Winquist, Doreene M. ([id number]) Elevate leg(s) parallel to the floor when sitting. Medications-Please add to medication list.: Wound #2 Posterior Foot: Take one 578m Tylenol (Acetaminophen) and one 2031mMotrin (Ibuprofen) every 6 hours for pain. Do not take ibuprofen if you are on blood thinners or have stomach ulcers. - take every 4 hours while awake WOUND #2: - Foot Wound Laterality: Posterior Cleanser: Byram Ancillary Kit - 15 Day Supply (Generic) 3 x Per Day/30  Days Discharge Instructions: Use supplies as instructed; Kit contains: (15) Saline Bullets; (15) 3x3 Gauze; 15 pr Gloves Cleanser: Soap and Water 3 x Per Day/30 Days Discharge Instructions: Gently cleanse wound with antibacterial soap, rinse and pat dry prior to dressing wounds Topical: Gentamicin 3 x Per Day/30 Days Discharge Instructions: Apply as directed by provider. Primary Dressing: Hydrofera Blue Ready Transfer Foam, 4x5 (in/in) (Generic) 3 x Per Day/30 Days Discharge Instructions: Apply Hydrofera Blue Ready to wound bed as directed Secondary Dressing: (BORDER) Zetuvit Plus SILICONE BORDER Dressing 4x4 (in/in) (Generic) 3 x Per Day/30 Days Discharge Instructions: Please do not put silicone bordered dressings under wraps. Use non-bordered dressing only. 1. I would recommend currently that we continue with the gentamicin just a very thin film on top Hydrofera Blue. 2. We will get a [facility]. We will continue with the bordered foam dressing to cover which I think is also doing quite well. We will see patient back for reevaluation in 1 week here in the clinic. If anything worsens or changes patient will contact our office for additional recommendations. Electronic Signature(s) Signed: 09/07/2021 11:50:01 AM By: StWorthy KeelerA-C Entered By: StWorthy Keelern 09/07/2021 11:50:01

## 2021-09-14 ENCOUNTER — Ambulatory Visit (INDEPENDENT_AMBULATORY_CARE_PROVIDER_SITE_OTHER): Payer: Medicare PPO | Admitting: Psychology

## 2021-09-14 DIAGNOSIS — F331 Major depressive disorder, recurrent, moderate: Secondary | ICD-10-CM
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
## 2021-09-22 ENCOUNTER — Encounter: Payer: Medicare PPO | Attending: Physician Assistant | Admitting: Physician Assistant

## 2021-09-22 DIAGNOSIS — M86671 Other chronic osteomyelitis, right ankle and foot: Secondary | ICD-10-CM | POA: Insufficient documentation

## 2021-09-22 DIAGNOSIS — L84 Corns and callosities: Secondary | ICD-10-CM | POA: Insufficient documentation

## 2021-09-22 DIAGNOSIS — G7100 Muscular dystrophy, unspecified: Secondary | ICD-10-CM | POA: Diagnosis not present

## 2021-09-22 DIAGNOSIS — G600 Hereditary motor and sensory neuropathy: Secondary | ICD-10-CM | POA: Insufficient documentation

## 2021-09-22 DIAGNOSIS — L97512 Non-pressure chronic ulcer of other part of right foot with fat layer exposed: Secondary | ICD-10-CM | POA: Insufficient documentation

## 2021-09-22 DIAGNOSIS — L02415 Cutaneous abscess of right lower limb: Secondary | ICD-10-CM | POA: Diagnosis not present

## 2021-09-22 NOTE — Progress Notes (Signed)
[name], [name] ([id number]) Visit Report for 09/22/2021 Chief Complaint Document Details Patient Name: [name], [name]. Date of Service: 09/22/2021 12:45 PM Medical Record Number: [id number] Patient Account Number: [id number] Date of Birth/Sex: 05/18/1975 (46 y.o. F) Treating RN: Cornell Barman Primary Care Provider: [name] Other Clinician: Referring Provider: [name] Treating Provider/Extender: [name] in Treatment: 4 Information Obtained from: Patient Chief Complaint Right foot ulcer Electronic Signature(s) Signed: 09/22/2021 12:47:11 PM By: [name] Entered By: Worthy Keeler on 09/22/2021 12:47:10 Luckett, [name] ([id number]) -------------------------------------------------------------------------------- Debridement Details Patient Name: [name]. Date of Service: 09/22/2021 12:45 PM Medical Record Number: [id number] Patient Account Number: [id number] Date of Birth/Sex: 09-04-1975 (46 y.o. F) Treating RN: Cornell Barman Primary Care Provider: [name] Other Clinician: Referring Provider: [name] Treating Provider/Extender: [name] in Treatment: 4 Debridement Performed for Wound #2 Posterior Foot Assessment: Performed By: Physician [name], [name] Debridement Type: Debridement Level of Consciousness (Pre- Awake and Alert procedure): Pre-procedure Verification/Time Out Yes - 13:07 Taken: Start Time: 13:07 Total Area Debrided (L x W): 0.6 (cm) x 0.6 (cm) = 0.36 (cm) Tissue and other material Viable, Non-Viable, Callus, Slough, Subcutaneous, Slough debrided: Level: Skin/Subcutaneous Tissue Debridement Description: Excisional Instrument: Curette Bleeding: Minimum Hemostasis Achieved: Pressure End Time: 13:12 Response to Treatment: Procedure was tolerated well Level of Consciousness (Post- Awake and Alert procedure): Post Debridement Measurements of Total Wound Length: (cm) 0.4 Width: (cm)  0.4 Depth: (cm) 0.4 Volume: (cm) 0.05 Character of Wound/Ulcer Post Debridement: Stable Post Procedure Diagnosis Same as Pre-procedure Electronic Signature(s) Unsigned Entered By: [name] on 09/22/2021 13:12:54 Signature(s): Date(s): [name] ([id number]) -------------------------------------------------------------------------------- HPI Details Patient Name: [name]. Date of Service: 09/22/2021 12:45 PM Medical Record Number: [id number] Patient Account Number: [id number] Date of Birth/Sex: 12-05-1975 (46 y.o. F) Treating RN: Cornell Barman Primary Care Provider: [name] Other Clinician: Referring Provider: [name] Treating Provider/Extender: [name] in Treatment: 4 History of Present Illness Location: right first metatarsal head plantar aspect Quality: Patient reports experiencing a dull pain to affected area(s). Severity: Patient states wound are getting worse. Duration: Patient has had the wound for > 2 months prior to seeking treatment at the wound center Timing: Pain in wound is Intermittent (comes and goes Context: The wound appeared gradually over time Modifying Factors: Other treatment(s) tried include:pain clinic for her narcotics Associated Signs and Symptoms: Patient reports having difficulty standing for long periods. HPI Description: 46 year old patient sent to [name] by her PCP [name] who saw her recently on 05/15/2015 for chronic pain related to [name] and subsequent problems with her feet with a history of bilateral great toe amputations for osteomyelitis. Her notes were reviewed and there was extensive history of opioid treatment and management by the [facility] pain clinic from where she has been terminated. X-ray of the right foot foot was done on 03/13/2015 and was suggestive of active infection and possible osteomyelitis. her past surgical history includes amputation of the left big toe in 2011  and amputation of the right big toe in 2015. She was sitting the wound clinic at [facility] and we will try and obtain these notes. She is a smoker and smokes about 15 cigarettes a day. 06/12/2015 - x-ray of the right foot -- IMPRESSION:1. Soft tissue swelling of the plantar surface of the foot. No underlying acute abnormality. 2. Amputation deformity again of the right great toe. Old healed bony deformity noted the right second metatarsal.  No acute bony abnormality identified. We have not received any reports from the [facility] hospital yet. She continues to smoke at least 10 cigarettes a day 06/19/2015 -- she has increasing pain in the right foot and also has had some low-grade fever. Her vascular test is scheduled for tomorrow and her MRIs not till next week. Readmission: 08-20-2021 upon evaluation today patient presents for initial inspection here in our clinic although she has been seen that was back in 2017. At that time she was having a issue with her right foot. With that being said upon inspection today she is actually having issues with the right foot as well on the medial aspect where she has some breakdown due to what I am assuming is pressure to the foot location. She does have Kelly-Marie- Tooth [name] she also has a history of muscular dystrophy, callus buildup, and osteomyelitis of the ankle and foot which has been demonstrated at this point as well to be present currently. I did review her culture report which showed evidence of bacteria including hemolytic Streptococcus group A as well as Staphylococcus aureus. The patient is currently on doxycycline and tells me that this is getting much better. Topical gentamicin is also being utilized. Also did review her arterial studies which show that she has on the right and ABI of 1.15 which is triphasic and appears to be excellent there is no significant peripheral artery [name] noted the left is also. With ABI of 1.11 and a good report in  that regard as well. Lastly I did also review her MRI of the right forefoot which was performed on 07-06-2021. This shows that she does have mild subcortical marrow edema in the stump of the first metatarsal concerning for osteomyelitis. There is also a malunited fracture of the second metatarsal. The patient has bone marrow edema in the base of the second third fourth and fifth metatarsals concerning for stress reaction obviously this infection is going to be an issue for [name] here as well she needs to be treated appropriately and I do believe that the doxycycline is a good option right now although I may need to #1 extend this and #2 plan for even altering or adding to the treatment regimen depending on how things progress. Patient voiced understanding.Marland Kitchen 09-01-2021 upon evaluation today patient appears to be doing excellent in regard to her wound. She is actually showing signs of excellent improvement which is great news. Fortunately I do not see any signs of active infection locally or systemically at this point which is great news and overall I am extremely pleased with where things stand today. I do not see any evidence of active infection locally or systemically. 09-07-2021 upon evaluation today patient appears to be doing well with regard to her foot ulcer. This is actually showing signs of improvement which is great news. Fortunately there does not appear to be any signs of active infection locally or systemically at this time which is as well. 09-22-2021 upon evaluation today patient appears to be doing well currently in regard to her wound. This is measuring smaller and looking better were slowly making progress here. Fortunately there does not appear to be any evidence of active infection locally or systemically at this time. Upon inspection patient's wound bed showed evidence of good granulation and epithelization at this point. Electronic Signature(s) Signed: 09/22/2021 1:17:36 PM By: [name] Entered By: Worthy Keeler on 09/22/2021 13:17:36 [name], [name] ([id number]) -------------------------------------------------------------------------------- Physical Exam Details Patient Name:  [name], Kelly M. Date of Service: 09/22/2021 12:45 PM Medical Record Number: [id number] Patient Account Number: [id number] Date of Birth/Sex: 05-May-1975 (46 y.o. F) Treating RN: Cornell Barman Primary Care Provider: [name] Other Clinician: Referring Provider: [name] Treating Provider/Extender: [name] in Treatment: 4 Constitutional Well-nourished and well-hydrated in no acute distress. Respiratory normal breathing without difficulty. Psychiatric this patient is able to make decisions and demonstrates good insight into [name] process. Alert and Oriented x 3. pleasant and cooperative. Notes Upon inspection patient's wound bed actually showed signs of good granulation epithelization there was some callus buildup which I am able to clean away today. Also do believe that the patient has some slough and biofilm buildup on the surface of the wound that is can require sharp debridement as well. In general however I really feel like she is doing quite well. Electronic Signature(s) Signed: 09/22/2021 1:17:56 PM By: [name] Entered By: Worthy Keeler on 09/22/2021 13:17:56 [name], [name] ([id number]) -------------------------------------------------------------------------------- Physician Orders Details Patient Name: [name]. Date of Service: 09/22/2021 12:45 PM Medical Record Number: [id number] Patient Account Number: [id number] Date of Birth/Sex: 02-18-1975 (46 y.o. 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 Bathing/ Shower/ Hygiene o May shower with wound dressing protected with water repellent cover or cast protector. o No tub bath. Anesthetic (Use 'Patient Medications' Section for Anesthetic Order Entry) o Lidocaine applied to wound bed Edema Control - Lymphedema / Segmental Compressive Device / Other o Elevate, Exercise Daily and Avoid Standing for Long Periods of Time. o Elevate legs to the level of the heart and pump ankles as often as possible o Elevate leg(s) parallel to the floor when sitting. Medications-Please add to medication list. Wound #2 Posterior Foot o Take one 572m Tylenol (Acetaminophen) and one 2028mMotrin (Ibuprofen) every 6 hours for pain. Do not take ibuprofen if you are on blood thinners or have stomach ulcers. - take every 4 hours while awake Wound Treatment Wound #2 - Foot Wound Laterality: Posterior Cleanser: Byram Ancillary Kit - 15 Day Supply (Generic) 3 x Per Day/30 Days Discharge Instructions: Use supplies as instructed; Kit contains: (15) Saline Bullets; (15) 3x3 Gauze; 15 pr Gloves Cleanser: Soap and Water 3 x Per Day/30 Days Discharge Instructions: Gently cleanse wound with antibacterial soap, rinse and pat dry prior to dressing wounds Topical: Gentamicin 3 x Per Day/30 Days Discharge Instructions: Apply as directed by provider. Primary Dressing: Hydrofera Blue Ready Transfer Foam, 4x5 (in/in) (Generic) 3 x Per Day/30 Days Discharge Instructions: Apply Hydrofera Blue Ready to wound bed as directed Secondary Dressing: (BORDER) Zetuvit Plus SILICONE BORDER Dressing 4x4 (in/in) (DME) (Dispense As Written) 3 x Per Day/30 Days Discharge Instructions:  Please do not put silicone bordered dressings under wraps. Use non-bordered dressing only. Electronic Signature(s) Unsigned Entered By: VeMassie Kluvern 09/22/2021 13:21:23 Signature(s): Date(s): [name], Kelly M. ([id number], Eri M. ([id number]-------------------------------------------------------------------------------- Problem List Details Patient Name: NEHall BusingDate of Service: 09/22/2021 12:45 PM Medical Record Number: [id number] Account Number: [id number] of Birth/Sex: [birth year]o. 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 ([id number]) -------------------------------------------------------------------------------- Progress Note Details Patient Name: [name]. Date of Service: 09/22/2021 12:45 PM Medical Record Number: [id number] Patient Account Number: [id number] Date of Birth/Sex: 03/25/1975 (46 y.o. F) Treating RN: Cornell Barman Primary Care Provider: [name] Other Clinician: Referring Provider: [name] Treating Provider/Extender: [name] in Treatment: 4 Subjective Chief Complaint Information obtained from Patient Right foot ulcer History of Present Illness  (HPI) The following HPI elements were documented for the patient's wound: Location: right first metatarsal head plantar aspect Quality: Patient reports experiencing a dull pain to affected area(s). Severity: Patient states wound are getting worse. Duration: Patient has had the wound for > 2 months prior to seeking treatment at the wound center Timing: Pain in wound is Intermittent (comes and goes Context: The wound appeared gradually over time Modifying Factors: Other treatment(s) tried include:pain clinic for her narcotics Associated Signs and Symptoms: Patient reports having difficulty standing for long periods. 46 year old patient sent to [name] by her PCP [name] who saw her recently on 05/15/2015 for chronic pain related to [name] and subsequent problems with her feet with a history of bilateral great toe amputations for osteomyelitis. Her notes were reviewed and there was extensive history of opioid treatment and management by the [facility] pain clinic from where she has been terminated. X-ray of the right foot foot was done on 03/13/2015 and was suggestive of active infection and possible osteomyelitis. her past surgical history includes amputation of the left big toe in 2011 and amputation of the right big toe in 2015. She was sitting the wound clinic at [facility] and we will try and obtain these notes. She is a smoker and smokes about 15 cigarettes a day. 06/12/2015 - x-ray of the right foot -- IMPRESSION:1. Soft tissue swelling of the plantar surface of the foot. No underlying acute abnormality. 2. Amputation deformity again of the right great toe. Old healed bony deformity noted the right second metatarsal. No acute bony abnormality identified. We have not received any reports from the [facility] hospital yet. She continues to smoke at least 10 cigarettes a day 06/19/2015 -- she has increasing pain in the right foot and also has had some low-grade fever. Her vascular  test is scheduled for tomorrow and her MRIs not till next week. Readmission: 08-20-2021 upon evaluation today patient presents for initial inspection here in our clinic although she has been seen that was back in 2017. At that time she was having a issue with her right foot. With that being said upon inspection today she is actually having issues with the right foot as well on the medial aspect where she has some breakdown due to what I am assuming is pressure to the foot location. She does have Kelly-Marie- Tooth [name] she also has a history of muscular dystrophy, callus buildup, and osteomyelitis of the ankle and  foot which has been demonstrated at this point as well to be present currently. I did review her culture report which showed evidence of bacteria including hemolytic Streptococcus group A as well as Staphylococcus aureus. The patient is currently on doxycycline and tells me that this is getting much better. Topical gentamicin is also being utilized. Also did review her arterial studies which show that she has on the right and ABI of 1.15 which is triphasic and appears to be excellent there is no significant peripheral artery [name] noted the left is also. With ABI of 1.11 and a good report in that regard as well. Lastly I did also review her MRI of the right forefoot which was performed on 07-06-2021. This shows that she does have mild subcortical marrow edema in the stump of the first metatarsal concerning for osteomyelitis. There is also a malunited fracture of the second metatarsal. The patient has bone marrow edema in the base of the second third fourth and fifth metatarsals concerning for stress reaction obviously this infection is going to be an issue for [name] here as well she needs to be treated appropriately and I do believe that the doxycycline is a good option right now although I may need to #1 extend this and #2 plan for even altering or adding to the treatment regimen depending  on how things progress. Patient voiced understanding.Marland Kitchen 09-01-2021 upon evaluation today patient appears to be doing excellent in regard to her wound. She is actually showing signs of excellent improvement which is great news. Fortunately I do not see any signs of active infection locally or systemically at this point which is great news and overall I am extremely pleased with where things stand today. I do not see any evidence of active infection locally or systemically. 09-07-2021 upon evaluation today patient appears to be doing well with regard to her foot ulcer. This is actually showing signs of improvement which is great news. Fortunately there does not appear to be any signs of active infection locally or systemically at this time which is as well. 09-22-2021 upon evaluation today patient appears to be doing well currently in regard to her wound. This is measuring smaller and looking better were slowly making progress here. Fortunately there does not appear to be any evidence of active infection locally or systemically at this time. Upon inspection patient's wound bed showed evidence of good granulation and epithelization at this point. [name], [name] ([id number]) Objective Constitutional Well-nourished and well-hydrated in no acute distress. Vitals Time Taken: 12:53 PM, Height: 67 in, Weight: 153 lbs, BMI: 24, Temperature: 98.9 F, Pulse: 80 bpm, Respiratory Rate: 16 breaths/min, Blood Pressure: 128/82 mmHg. Respiratory normal breathing without difficulty. Psychiatric this patient is able to make decisions and demonstrates good insight into [name] process. Alert and Oriented x 3. pleasant and cooperative. General Notes: Upon inspection patient's wound bed actually showed signs of good granulation epithelization there was some callus buildup which I am able to clean away today. Also do believe that the patient has some slough and biofilm buildup on the surface of the wound that is  can require sharp debridement as well. In general however I really feel like she is doing quite well. Integumentary (Hair, Skin) Wound #2 status is Open. Original cause of wound was Trauma. The date acquired was: 06/18/2021. The wound has been in treatment 4 weeks. The wound is located on the Posterior Foot. The wound measures 0.4cm length x 0.4cm width x 0.3cm depth; 0.126cm^2 area and 0.038cm^3  volume. There is Fat Layer (Subcutaneous Tissue) exposed. There is no tunneling or undermining noted. There is a small amount of serosanguineous drainage noted. There is large (67-100%) pink granulation within the wound bed. There is a small (1-33%) amount of necrotic tissue within the wound bed including Adherent Slough. 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 There was a Excisional Skin/Subcutaneous Tissue Debridement with a total area of 0.36 sq cm performed by [name], [name]. With the following instrument(s): Curette to remove Viable and Non-Viable tissue/material. Material removed includes Callus, Subcutaneous Tissue, and Slough. A time out was conducted at 13:07, prior to the start of the procedure. A Minimum amount of bleeding was controlled with Pressure. The procedure was tolerated well. Post Debridement Measurements: 0.4cm length x 0.4cm width x 0.4cm depth; 0.05cm^3 volume. Character of Wound/Ulcer Post Debridement is stable. Post procedure Diagnosis Wound #2: Same as Pre-Procedure Plan Follow-up Appointments: Return Appointment in 1 week. Bathing/ Shower/ Hygiene: May shower with wound dressing protected with water repellent cover or cast protector. No tub  bath. Anesthetic (Use 'Patient Medications' Section for Anesthetic Order Entry): Lidocaine applied to wound bed [name], Kelly M. ([id number]) Edema Control - Lymphedema / Segmental Compressive Device / Other: Elevate, Exercise Daily and Avoid Standing for Long Periods of Time. Elevate legs to the level of the heart and pump ankles as often as possible Elevate leg(s) parallel to the floor when sitting. Medications-Please add to medication list.: Wound #2 Posterior Foot: Take one 577m Tylenol (Acetaminophen) and one 2027mMotrin (Ibuprofen) every 6 hours for pain. Do not take ibuprofen if you are on blood thinners or have stomach ulcers. - take every 4 hours while awake WOUND #2: - Foot Wound Laterality: Posterior Cleanser: Byram Ancillary Kit - 15 Day Supply (Generic) 3 x Per Day/30 Days Discharge Instructions: Use supplies as instructed; Kit contains: (15) Saline Bullets; (15) 3x3 Gauze; 15 pr Gloves Cleanser: Soap and Water 3 x Per Day/30 Days Discharge Instructions: Gently cleanse wound with antibacterial soap, rinse and pat dry prior to dressing wounds Topical: Gentamicin 3 x Per Day/30 Days Discharge Instructions: Apply as directed by provider. Primary Dressing: Hydrofera Blue Ready Transfer Foam, 4x5 (in/in) (Generic) 3 x Per Day/30 Days Discharge Instructions: Apply Hydrofera Blue Ready to wound bed as directed Secondary Dressing: (BORDER) Zetuvit Plus SILICONE BORDER Dressing 4x4 (in/in) (Generic) 3 x Per Day/30 Days Discharge Instructions: Please do not put silicone bordered dressings under wraps. Use non-bordered dressing only. 1. I would recommend currently that we go ahead and continue with the wound care measures as before and the patient is in agreement with plan. This includes the [facility] think [facility] doing a good job here. 2. Also can recommend that we have the patient continue to monitor for any signs of worsening or infection. Obviously if anything  changes patient should contact the office and let me know. We are using gentamicin topically to prevent any bacterial buildup. We will see patient back for reevaluation in 1 week here in the clinic. If anything worsens or changes patient will contact our office for additional recommendations. Electronic Signature(s) Signed: 09/22/2021 1:18:38 PM By: StWorthy KeelerA-C Entered By: StWorthy Keelern 09/22/2021 13:18:38 [name],  Kelly M. ([id number]) -------------------------------------------------------------------------------- SuperBill Details Patient Name: [name], HESSE. Date of Service: 09/22/2021 Medical Record Number: [id number] Patient Account Number: [id number] Date of Birth/Sex: 1975/12/01 (46 y.o. 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 L84 Corns and callosities Facility Procedures CPT4 Code: [id number] Description: 43154 - DEB SUBQ TISSUE 20 SQ CM/< Modifier: Quantity: 1 CPT4 Code: Description: ICD-10 Diagnosis Description L97.512 Non-pressure chronic ulcer of other part of right foot with fat layer ex Modifier: posed Quantity: Physician Procedures CPT4 Code: [id number] Description: 11042 - WC PHYS SUBQ TISS 20 SQ CM Modifier: Quantity: 1 CPT4 Code: Description: ICD-10 Diagnosis Description L97.512 Non-pressure chronic ulcer of other part of right foot with fat layer ex Modifier: posed Quantity: Electronic Signature(s) Signed: 09/22/2021 1:18:50 PM By: [name] Entered By: Worthy Keeler on 09/22/2021 13:18:50

## 2021-09-22 NOTE — Progress Notes (Signed)
CATHARINA, PICA ([id number]) Visit Report for 09/22/2021 Arrival Information Details Patient Name: [name], [name]. Date of Service: 09/22/2021 12:45 PM Medical Record Number: [id number] Patient Account Number: [id number] Date of Birth/Sex: 01/01/1976 (46 y.o. F) Treating RN: Cornell Barman Primary Care Lumi Winslett: [name] Other Clinician: Referring Mindy Behnken: [name] Treating Larkin Alfred/Extender: [name] in Treatment: 4 Visit Information History Since Last Visit All ordered tests and consults were completed: No Patient Arrived: Ambulatory Added or deleted any medications: No Arrival Time: 12:48 Any new allergies or adverse reactions: No Transfer Assistance: None Had a fall or experienced change in No Patient Requires Transmission-Based Precautions: No activities of daily living that may affect Patient Has Alerts: Yes risk of falls: Patient Alerts: ABI R 1.15 02/04/21 Hospitalized since last visit: No ABI L 1.11 02/04/21 Pain Present Now: Yes Electronic Signature(s) Unsigned Entered By: [name] on 09/22/2021 12:52:25 Signature(s): Date(s): [name] ([id number]) -------------------------------------------------------------------------------- Clinic Level of Care Assessment Details Patient Name: [name], [name]. Date of Service: 09/22/2021 12:45 PM Medical Record Number: [id number] Patient Account Number: [id number] Date of Birth/Sex: 1975/12/17 (46 y.o. F) Treating RN: Cornell Barman Primary Care Shae Augello: [name] Other Clinician: Referring Austin Herd: [name] Treating Cornelio Parkerson/Extender: [name] in Treatment: 4 Clinic Level of Care Assessment Items TOOL 1 Quantity Score _0  - Use when EandM and Procedure is performed on INITIAL visit 0 ASSESSMENTS - Nursing Assessment / Reassessment _1  - General Physical Exam (combine w/ comprehensive assessment (listed just below) when performed on new 0 pt. evals) _2  -  0 Comprehensive Assessment (HX, ROS, Risk Assessments, Wounds Hx, etc.) ASSESSMENTS - Wound and Skin Assessment / Reassessment _3  - Dermatologic / Skin Assessment (not related to wound area) 0 ASSESSMENTS - Ostomy and/or Continence Assessment and Care _4  - Incontinence Assessment and Management 0 _5  - 0 Ostomy Care Assessment and Management (repouching, etc.) PROCESS - Coordination of Care _6  - Simple Patient / Family Education for ongoing care 0 _7  - 0 Complex (extensive) Patient / Family Education for ongoing care _8  - 0 Staff obtains Programmer, systems, Records, Test Results / Process Orders _9  - 0 Staff telephones HHA, Nursing Homes / Clarify orders / etc _10  - 0 Routine Transfer to another Facility (non-emergent condition) _11  - 0 Routine Hospital Admission (non-emergent condition) _12  - 0 New Admissions / Biomedical engineer / Ordering NPWT, Apligraf, etc. _13  - 0 Emergency Hospital Admission (emergent condition) PROCESS - Special Needs _14  - Pediatric / Minor Patient Management 0 _15  - 0 Isolation Patient Management _16  - 0 Hearing / Language / Visual special needs _17  - 0 Assessment of Community assistance (transportation, D/C planning, etc.) _18  - 0 Additional assistance / Altered mentation _19  - 0 Support Surface(s) Assessment (bed, cushion, seat, etc.) INTERVENTIONS - Miscellaneous _20  - External ear exam 0 _21  - 0 Patient Transfer (multiple staff / Civil Service fast streamer / Similar devices) _22  - 0 Simple Staple / Suture removal (25 or less) _23  - 0 Complex Staple / Suture removal (26 or more) _24  - 0 Hypo/Hyperglycemic Management (do not check if billed separately) _25  - 0 Ankle / Brachial Index (ABI) - do not check if billed separately Has the patient been seen at the hospital within the last three years: Yes Total Score: 0 Level Of Care: ____ [name] ([id number]) Electronic Signature(s) Unsigned Entered By: [name] on 09/22/2021  13:13:12 Signature(s): Date(s): [name] ([id number]) -------------------------------------------------------------------------------- Encounter Discharge Information Details Patient Name: [name]. Date of Service: 09/22/2021 12:45 PM Medical Record Number:  [id number] Patient Account Number: [id number] Date of Birth/Sex: [birth year] (46 y.o. F) Treating RN: Cornell Barman Primary Care Ambriana Selway: [name] Other Clinician: Referring Beaumont Austad: [name] Treating Rebeka Kimble/Extender: [name] in Treatment: 4 Encounter Discharge Information Items Post Procedure Vitals Discharge Condition: Stable Temperature (F): 98.9 Ambulatory Status: Ambulatory Pulse (bpm): 80 Discharge Destination: Home Respiratory Rate (breaths/min): 16 Transportation: Private Auto Blood Pressure (mmHg): 128/82 Accompanied By: self Schedule Follow-up Appointment: Yes Clinical Summary of Care: Electronic Signature(s) Unsigned Entered ByMassie Kluver on 09/22/2021 13:15:01 Signature(s): Date(s): [name] ([id number]) -------------------------------------------------------------------------------- Lower Extremity Assessment Details Patient Name: [name], [name]. Date of Service: 09/22/2021 12:45 PM Medical Record Number: [id number] Patient Account Number: [id number] Date of Birth/Sex: September 19, 1975 (45 y.o. F) Treating RN: Cornell Barman Primary Care Tion Tse: [name] Other Clinician: Referring Seona Clemenson: [name] Treating Thong Feeny/Extender: [name] in Treatment: 4 Edema Assessment Assessed: [Left: No] [Right: Yes] Edema: [Left: N] [Right: o] Calf Left: Right: Point of Measurement: 30 cm From Medial Instep 33.6 cm Ankle Left: Right: Point of Measurement: 10 cm From Medial Instep 23 cm Vascular Assessment Pulses: Dorsalis Pedis Palpable: [Right:Yes] Electronic Signature(s) Unsigned Entered ByMassie Kluver on 09/22/2021  13:00:21 Signature(s): Date(s): Devinney, [name] ([id number]) -------------------------------------------------------------------------------- Multi Wound Chart Details Patient Name: [name], [name]. Date of Service: 09/22/2021 12:45 PM Medical Record Number: [id number] Patient Account Number: [id number] Date of Birth/Sex: [birth year] (46 y.o. F) Treating RN: Cornell Barman Primary Care [place] Brogden: [name] Other Clinician: Referring Ahuva Poynor: [name] Treating Merick Kelleher/Extender: [name] in Treatment: 4 Vital Signs Height(in): 67 Pulse(bpm): 80 Weight(lbs): 153 Blood Pressure(mmHg): 128/82 Body Mass Index(BMI): 24 Temperature(F): 98.9 Respiratory Rate(breaths/min): 16 Photos: [N/A:N/A] Wound Location: Posterior Foot N/A N/A Wounding Event: Trauma N/A N/A Primary Etiology: Abscess N/A N/A Comorbid History: Anemia, Asthma, History of N/A N/A pressure wounds, Osteomyelitis, Neuropathy Date Acquired: 06/18/2021 N/A N/A Weeks of Treatment: 4 N/A N/A Wound Status: Open N/A N/A Wound Recurrence: No N/A N/A Measurements L x W x D (cm) 0.4x0.4x0.3 N/A N/A Area (cm) : 0.126 N/A N/A Volume (cm) : 0.038 N/A N/A % Reduction in Area: 82.20% N/A N/A % Reduction in Volume: 86.60% N/A N/A Classification: Full Thickness Without Exposed N/A N/A Support Structures Exudate Amount: Small N/A N/A Exudate Type: Serosanguineous N/A N/A Exudate Color: red, brown N/A N/A Granulation Amount: Large (67-100%) N/A N/A Granulation Quality: Pink N/A N/A Necrotic Amount: Small (1-33%) N/A N/A Exposed Structures: Fat Layer (Subcutaneous Tissue): N/A N/A Yes Fascia: No Tendon: No Muscle: No Joint: No Bone: No Epithelialization: None N/A N/A Treatment Notes Electronic Signature(s) Unsigned Entered ByMassie Kluver on 09/22/2021 13:00:30 Signature(s): Date(s): Mccluney, [place] M. ([id number]) Sherlean Foot, Rosaleen M.  ([id number]) -------------------------------------------------------------------------------- [place] Details Patient Name: [name]. Date of Service: 09/22/2021 12:45 PM Medical Record Number: [id number] Patient Account Number: [id number] Date of Birth/Sex: 10-23-1975 (46 y.o. F) Treating RN: Cornell Barman Primary Care Eugenia Eldredge: [name] Other Clinician: Referring Kacey Dysert: [name] Treating Ahaan Zobrist/Extender: [name] in Treatment: 4 Active Inactive Wound/Skin Impairment Nursing Diagnoses: Knowledge deficit related to ulceration/compromised skin integrity Goals: Patient/caregiver will verbalize understanding of skin care regimen Date Initiated: 08/20/2021 Target Resolution Date: 09/20/2021 Goal Status: Active Ulcer/skin breakdown will have a volume reduction of 30% by week 4 Date Initiated: 08/20/2021 Target Resolution Date: 09/20/2021 Goal Status: Active Ulcer/skin breakdown will have a volume reduction of 50% by week 8 Date Initiated: 08/20/2021 Target Resolution Date: 10/20/2021 Goal Status: Active Ulcer/skin breakdown will have a volume reduction of 80% by  week 12 Date Initiated: 08/20/2021 Target Resolution Date: 11/20/2021 Goal Status: Active Ulcer/skin breakdown will heal within 14 weeks Date Initiated: 08/20/2021 Target Resolution Date: 12/20/2021 Goal Status: Active Interventions: Assess patient/caregiver ability to obtain necessary supplies Assess patient/caregiver ability to perform ulcer/skin care regimen upon admission and as needed Assess ulceration(s) every visit Notes: Electronic Signature(s) Unsigned Entered By: [name] on 09/22/2021 13:00:24 Signature(s): Date(s): [name] ([id number]) -------------------------------------------------------------------------------- Pain Assessment Details Patient Name: [name], [name]. Date of Service: 09/22/2021 12:45 PM Medical Record Number: [id number] Patient  Account Number: [id number] Date of Birth/Sex: [birth year] (46 y.o. F) Treating RN: Cornell Barman Primary Care Neizan Debruhl: [name] Other Clinician: Referring Enrica Corliss: [name] Treating Kyleigh Nannini/Extender: [name] in Treatment: 4 Active Problems Location of Pain Severity and Description of Pain Patient Has Paino Yes Site Locations Pain Location: Pain in Ulcers Duration of the Pain. Constant / Intermittento Intermittent Rate the pain. Current Pain Level: 6 Character of Pain Describe the Pain: Aching, Burning, Stabbing Pain Management and Medication Current Pain Management: Medication: Yes Rest: Yes Electronic Signature(s) Unsigned Entered By: [name] on 09/22/2021 12:54:57 Signature(s): Date(s): [name] ([id number]) -------------------------------------------------------------------------------- Patient/Caregiver Education Details Patient Name: [name]. Date of Service: 09/22/2021 12:45 PM Medical Record Number: [id number] Patient Account Number: [id number] Date of Birth/Gender: 04/04/1975 (46 y.o. F) Treating RN: Cornell Barman Primary Care Physician: [name] Other Clinician: Referring Physician: [name] Treating Physician/Extender: [name] in Treatment: 4 Education Assessment Education Provided To: Patient Education Topics Provided Wound/Skin Impairment: Handouts: Other: continue wound care as directed Methods: Explain/Verbal Responses: State content correctly Electronic Signature(s) Unsigned Entered By: [name] on 09/22/2021 13:14:03 Signature(s): Date(s): Riepe, [name] ([id number]) -------------------------------------------------------------------------------- Wound Assessment Details Patient Name: [name], [name]. Date of Service: 09/22/2021 12:45 PM Medical Record Number: [id number] Patient Account Number: [id number] Date of Birth/Sex: 10-13-1975 (46 y.o. F) Treating RN: Cornell Barman Primary Care Gitel Beste: [name] Other Clinician: Referring Desirre Eickhoff: [name] Treating Kerrie Latour/Extender: [name] in Treatment: 4 Wound Status Wound Number: 2 Primary Abscess Etiology: Wound Location: Posterior Foot Wound Status: Open Wounding Event: Trauma Comorbid Anemia, Asthma, History of pressure wounds, Date Acquired: 06/18/2021 History: Osteomyelitis, Neuropathy Weeks Of Treatment: 4 Clustered Wound: No Photos Wound Measurements Length: (cm) 0.4 Width: (cm) 0.4 Depth: (cm) 0.3 Area: (cm) 0.126 Volume: (cm) 0.038 % Reduction in Area: 82.2% % Reduction in Volume: 86.6% Epithelialization: None Tunneling: No Undermining: No Wound Description Classification: Full Thickness Without Exposed Support Structures Exudate Amount: Medium Exudate Type: Serosanguineous Exudate Color: red, brown Foul Odor After Cleansing: No Slough/Fibrino No Wound Bed Granulation Amount: Large (67-100%) Exposed Structure Granulation Quality: Pink Fascia Exposed: No Necrotic Amount: Small (1-33%) Fat Layer (Subcutaneous Tissue) Exposed: Yes Necrotic Quality: Adherent Slough Tendon Exposed: No Muscle Exposed: No Joint Exposed: No Bone Exposed: No Treatment Notes Wound #2 (Foot) Wound Laterality: Posterior Cleanser Byram Ancillary Kit - 15 Day Supply Discharge Instruction: Use supplies as instructed; Kit contains: (15) Saline Bullets; (15) 3x3 Gauze; 15 pr Gloves Soap and Water Discharge Instruction: Gently cleanse wound with antibacterial soap, rinse and pat dry prior to dressing wounds Vester, Kataleena M. ([id number]) Peri-Wound Care Topical Gentamicin Discharge Instruction: Apply as directed by Kilie Rund. Primary Dressing Hydrofera Blue Ready Transfer Foam, 4x5 (in/in) Discharge Instruction: Apply Hydrofera Blue Ready to wound bed as directed Secondary Dressing (BORDER) Zetuvit Plus SILICONE BORDER Dressing 4x4 (in/in) Discharge Instruction: Please do  not put silicone bordered dressings under wraps. Use non-bordered dressing only. Secured With Compression Wrap Compression  Stockings Environmental education officer) Unsigned Entered By: [name] on 09/22/2021 13:20:46 Signature(s): Date(s): [name], [name] ([id number]) -------------------------------------------------------------------------------- Vitals Details Patient Name: [name]. Date of Service: 09/22/2021 12:45 PM Medical Record Number: [id number] Patient Account Number: [id number] Date of Birth/Sex: [birth year] (46 y.o. F) Treating RN: Cornell Barman Primary Care Candelaria Pies: [name] Other Clinician: Referring Claudine Stallings: [name] Treating Chattie Greeson/Extender: [name] in Treatment: 4 Vital Signs Time Taken: 12:53 Temperature (F): 98.9 Height (in): 67 Pulse (bpm): 80 Weight (lbs): 153 Respiratory Rate (breaths/min): 16 Body Mass Index (BMI): 24 Blood Pressure (mmHg): 128/82 Reference Range: 80 - 120 mg / dl Electronic Signature(s) Unsigned Entered ByMassie Kluver on 09/22/2021 12:54:52 Signature(s): Date(s):

## 2021-09-28 ENCOUNTER — Ambulatory Visit (INDEPENDENT_AMBULATORY_CARE_PROVIDER_SITE_OTHER): Payer: Medicare PPO | Admitting: Psychology

## 2021-09-28 DIAGNOSIS — F331 Major depressive disorder, recurrent, moderate: Secondary | ICD-10-CM

## 2021-09-28 NOTE — Progress Notes (Signed)
[name] is a 46 y.o. female patient .  Diagnosis 296.30 (Major depressive affective disorder, recurrent episode, unspecified) [n/a]  Symptoms Depressed or irritable mood. (Status: maintained) -- No Description Entered  Diminished interest in or enjoyment of activities. (Status: maintained) -- No Description Entered  Feelings of hopelessness, worthlessness, or inappropriate guilt. (Status: maintained) -- No Description Entered  Lack of energy. (Status: maintained) -- No Description Entered  Unresolved grief issues. (Status: maintained) -- No Description Entered  Medication Status compliance  Safety none  If Suicidal or Homicidal State Action Taken: unspecified  Current Risk: low Medications Hydroxyzine (Dosage: unknown)  Remiron (Dosage: unknown)  Sertraline (Dosage: unknown)  Objectives Related Problem: Appropriately grieve the loss in order to normalize mood and to return to previously adaptive level of functioning. Description: Increasingly verbalize hopeful and positive statements regarding self, others, and the future. Target Date: 2022-01-13 Frequency: Daily Modality: individual Progress: 80%  Related Problem: Appropriately grieve the loss in order to normalize mood and to return to previously adaptive level of functioning. Description: Learn and implement conflict resolution skills to resolve interpersonal problems. Target Date: 2022-01-13 Frequency: Daily Modality: individual Progress: 80%  Related Problem: Appropriately grieve the loss in order to normalize mood and to return to previously adaptive level of functioning. Description: Learn and implement problem-solving and decision-making skills. Target Date: 2022-01-13 Frequency: Daily Modality: individual Progress: 80%  Related Problem: Appropriately grieve the loss in order to normalize mood and to return to previously adaptive level of functioning. Description: Describe  current and past experiences with depression including their impact on functioning and attempts to resolve it. Target Date: 2021-07-14 Frequency: Daily Modality: individual Progress: 100%-completed  Related Problem: Complete the process of letting go of the lost significant other. Description: Report decreased time spent each day focusing on the loss. Target Date: 2022-01-13 Frequency: Daily Modality: individual Progress: 80%  Related Problem: Complete the process of letting go of the lost significant other. Description: Decrease unrealistic thoughts, statements, and feelings of being responsible for the loss. Target Date: 2021-07-14 Frequency: Daily Modality: individual Progress: 100%-completed  Related Problem: Complete the process of letting go of the lost significant other. Description: Verbalize and resolve feelings of anger or guilt focused on self or deceased loved one that interfere with the grieving process. Target Date: 2021-07-14 Frequency: Daily Modality: individual Progress: 100%-completed  Related Problem: Complete the process of letting go of the lost significant other. Description: Begin verbalizing feelings associated with the loss. Target Date: 2022-01-13 Frequency: Daily Modality: individual Progress: 80%  Related Problem: Complete the process of letting go of the lost significant other. Description: Identify what stages of grief have been experienced in the continuum of the grieving process. Target Date: 2021-07-14 Frequency: Daily Modality: individual Progress: 100%-completed  Related Problem: Complete the process of letting go of the lost significant other. Description: Participate in a therapy that addresses issues beyond grief that have arisen as a result of the loss. Target Date: 2022-01-13 Frequency: Daily Modality: individual Progress: 80%  Related Problem: Complete the process of letting go of the lost significant other. Description: Tell in  detail the story of the current loss that is triggering symptoms. Target Date: 2021-07-14 Frequency: Daily Modality: individual Progress: 100%-completed  Client Response full compliance  Service Location Location, [address] [place], [place] [address]  Service Code cpt [phone]  Validate/empathize  Related past to present  Behavioral activation plan  Facilitate problem  solving  Identify automatic thoughts  Rationally challenge thoughts or beliefs/cognitive restructuring  Identify/label emotions  Emotion regulation skills  Psychiatrist  Self care activities  Self-monitoring  Session Notes: Patient requests a video Webex session due to the coronavirus. She is at home and I am at my home office.  Dx: Major Depression  Meds: Lexapro ('20mg'$ ), Busperone (7.5)  Goals: States that she is seeking counseling to learn to manage the grief reaction to losing her husband. She reports feeling tired all the time and that she has no energy or motivation. Needs to develop coping strategies. Therapy to focus on reducing despair, sadness, hopelessness and helplessness. 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 Will explore those early traumas/losses and current triggers. Goal dates: 04-2021 Revised Goals: Patient is doing much better with her grief/mourning process. She now needs help managing her reactions to her dysfunctional FOO. In addition, her life is out of control and she need help to focus and establish a plan. Continues to feel depressed and overwhelmed. Goal date is 12-23  [name] says that Friday was Jay's birthday. It is the 4th on since his death and it had a negative impact on her moods. She says she has tried thinking of "negative" things about her marriage hoping it would make her feel better, but it was unsuccessful. She spent time with parents and a friend that day and "it helped".  She says that she has been having some  anxiety attacks. States that it is related to her mother calling her every morning telling her to come over to her house. Her mother will keep calling and pressuring to hurry up. This gets [name] anxious and upset. She says that she has a morning ritual and her mother keeps calling and agitating her. Her morning ritual is important to her and the disruption sets up the day in a very negative way. We talked about the importance of setting and maintaining limits/boundaries. She gets easily caught up in negative mindset and trouble  getting out of it.                          [name], PhD Time: 7:40a-8:30a 50 minutes

## 2021-10-05 ENCOUNTER — Encounter: Payer: Medicare PPO | Admitting: Physician Assistant

## 2021-10-05 DIAGNOSIS — L97512 Non-pressure chronic ulcer of other part of right foot with fat layer exposed: Secondary | ICD-10-CM | POA: Diagnosis not present

## 2021-10-05 DIAGNOSIS — L84 Corns and callosities: Secondary | ICD-10-CM | POA: Diagnosis not present

## 2021-10-05 DIAGNOSIS — L02415 Cutaneous abscess of right lower limb: Secondary | ICD-10-CM | POA: Diagnosis not present

## 2021-10-05 DIAGNOSIS — G600 Hereditary motor and sensory neuropathy: Secondary | ICD-10-CM | POA: Diagnosis not present

## 2021-10-05 DIAGNOSIS — M86671 Other chronic osteomyelitis, right ankle and foot: Secondary | ICD-10-CM | POA: Diagnosis not present

## 2021-10-05 DIAGNOSIS — G7100 Muscular dystrophy, unspecified: Secondary | ICD-10-CM | POA: Diagnosis not present

## 2021-10-05 NOTE — Progress Notes (Signed)
[name], [name] ([id number]) Visit Report for 10/05/2021 Chief Complaint Document Details Patient Name: [name], [name]. Date of Service: 10/05/2021 9:45 AM Medical Record Number: [id number] Patient Account Number: [id number] Date of Birth/Sex: 08-04-1975 (46 y.o. F) Treating RN: [name] Primary Care Provider: [name] Other Clinician: [name] Referring Provider: [name] Treating Provider/Extender: [name] in Treatment: 6 Information Obtained from: Patient Chief Complaint Right foot ulcer Electronic Signature(s) Signed: 10/05/2021 10:21:48 AM By: [name] Entered By: Worthy Keeler on 10/05/2021 10:21:48 [name] ([id number]) -------------------------------------------------------------------------------- Problem List Details Patient Name: [name]. Date of Service: 10/05/2021 9:45 AM Medical Record Number: [id number] Patient Account Number: [id number] Date of Birth/Sex: Mar 16, 1975 (46 y.o. 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

## 2021-10-08 NOTE — Progress Notes (Signed)
LAKYNN, HALVORSEN ([id number]) Visit Report for 10/05/2021 Arrival Information Details Patient Name: [name], [name]. Date of Service: 10/05/2021 9:45 AM Medical Record Number: [id number] Patient Account Number: [id number] Date of Birth/Sex: [birth year] (46 y.o. F) Treating RN: [name] Primary Care Edyn Popoca: [name] Other Clinician: [name] Referring Steven Veazie: [name] Treating Leonilda Cozby/Extender: [name] in Treatment: 6 Visit Information History Since Last Visit All ordered tests and consults were completed: No Patient Arrived: Ambulatory Added or deleted any medications: No Arrival Time: 10:12 Any new allergies or adverse reactions: No Transfer Assistance: None Had a fall or experienced change in No Patient Requires Transmission-Based Precautions: No activities of daily living that may affect Patient Has Alerts: Yes risk of falls: Patient Alerts: ABI R 1.15 02/04/21 Hospitalized since last visit: No ABI L 1.11 02/04/21 Pain Present Now: No Electronic Signature(s) Signed: 10/08/2021 11:41:41 AM By: [name] Entered By: [name] on 10/05/2021 10:12:31 [name], Kelly M. ([id number]) -------------------------------------------------------------------------------- Clinic Level of Care Assessment Details Patient Name: [name]. Date of Service: 10/05/2021 9:45 AM Medical Record Number: [id number] Patient Account Number: [id number] Date of Birth/Sex: [birth year] (47 y.o. F) Treating RN: [name] Primary Care Jahmia Berrett: [name] Other Clinician: [name] Referring Emmanuel Gruenhagen: [name] Treating Carlethia Mesquita/Extender: [name] in Treatment: 6 Clinic Level of Care Assessment Items TOOL 1 Quantity Score []  - Use when EandM and Procedure is performed on INITIAL visit 0 ASSESSMENTS - Nursing Assessment / Reassessment []  - General Physical Exam (combine w/ comprehensive assessment (listed just below) when  performed on new 0 pt. evals) []  - 0 Comprehensive Assessment (HX, ROS, Risk Assessments, Wounds Hx, etc.) ASSESSMENTS - Wound and Skin Assessment / Reassessment []  - Dermatologic / Skin Assessment (not related to wound area) 0 ASSESSMENTS - Ostomy and/or Continence Assessment and Care []  - Incontinence Assessment and Management 0 []  - 0 Ostomy Care Assessment and Management (repouching, etc.) PROCESS - Coordination of Care []  - Simple Patient / Family Education for ongoing care 0 []  - 0 Complex (extensive) Patient / Family Education for ongoing care []  - 0 Staff obtains Programmer, systems, Records, Test Results / Process Orders []  - 0 Staff telephones HHA, Nursing Homes / Clarify orders / etc []  - 0 Routine Transfer to another Facility (non-emergent condition) []  - 0 Routine Hospital Admission (non-emergent condition) []  - 0 New Admissions / Biomedical engineer / Ordering NPWT, Apligraf, etc. []  - 0 Emergency Hospital Admission (emergent condition) PROCESS - Special Needs []  - Pediatric / Minor Patient Management 0 []  - 0 Isolation Patient Management []  - 0 Hearing / Language / Visual special needs []  - 0 Assessment of Community assistance (transportation, D/C planning, etc.) []  - 0 Additional assistance / Altered mentation []  - 0 Support Surface(s) Assessment (bed, cushion, seat, etc.) INTERVENTIONS - Miscellaneous []  - External ear exam 0 []  - 0 Patient Transfer (multiple staff / Civil Service fast streamer / Similar devices) []  - 0 Simple Staple / Suture removal (25 or less) []  - 0 Complex Staple / Suture removal (26 or more) []  - 0 Hypo/Hyperglycemic Management (do not check if billed separately) []  - 0 Ankle / Brachial Index (ABI) - do not check if billed separately Has the patient been seen at the hospital within the last three years: Yes Total Score: 0 Level Of Care: ____ [name] ([id number]) Electronic Signature(s) Signed: 10/08/2021 11:41:41 AM By: [name] Entered By: [name] on 10/05/2021 10:34:35 [name], Kelly M. ([id number]) -------------------------------------------------------------------------------- Encounter Discharge Information Details Patient Name:  Cavagnaro, Ishia M. Date of Service: 10/05/2021 9:45 AM Medical Record Number: [id number] Patient Account Number: [id number] Date of Birth/Sex: Aug 30, 1975 (46 y.o. F) Treating RN: [name] Primary Care Manila Rommel: [name] Other Clinician: [name] Referring Tracye Szuch: [name] Treating Jahrell Hamor/Extender: [name] in Treatment: 6 Encounter Discharge Information Items Post Procedure Vitals Discharge Condition: Stable Temperature (F): 98.8 Ambulatory Status: Ambulatory Pulse (bpm): 76 Discharge Destination: Home Respiratory Rate (breaths/min): 18 Transportation: Private Auto Blood Pressure (mmHg): 120/74 Accompanied By: self Schedule Follow-up Appointment: Yes Clinical Summary of Care: Electronic Signature(s) Signed: 10/08/2021 11:41:41 AM By: [name] Entered By: [name] on 10/05/2021 10:39:41 [name], Kelly M. ([id number]) -------------------------------------------------------------------------------- Lower Extremity Assessment Details Patient Name: [name]. Date of Service: 10/05/2021 9:45 AM Medical Record Number: [id number] Patient Account Number: [id number] Date of Birth/Sex: 1975/10/31 (46 y.o. F) Treating RN: [name] Primary Care Almetta Liddicoat: [name] Other Clinician: [name] Referring Kaiyden Simkin: [name] Treating Partick Musselman/Extender: [name] in Treatment: 6 Edema Assessment Assessed: [Left: No] [Right: Yes] Edema: [Left: N] [Right: o] Calf Left: Right: Point of Measurement: 30 cm From Medial Instep 35 cm Ankle Left: Right: Point of Measurement: 10 cm From Medial Instep 21.8 cm Vascular Assessment Pulses: Dorsalis Pedis Palpable: [Right:Yes] Electronic  Signature(s) Signed: 10/05/2021 2:31:54 PM By: [name] RN Signed: 10/08/2021 11:41:41 AM By: [name] Entered By: [name] on 10/05/2021 10:20:56 [name], Kelly M. ([id number]) -------------------------------------------------------------------------------- Multi Wound Chart Details Patient Name: [name]. Date of Service: 10/05/2021 9:45 AM Medical Record Number: [id number] Patient Account Number: [id number] Date of Birth/Sex: 02-02-1975 (46 y.o. F) Treating RN: [name] Primary Care Jerrion Tabbert: [name] Other Clinician: [name] Referring Marien Manship: [name] Treating Jasper Hanf/Extender: [name] in Treatment: 6 Vital Signs Height(in): 90 Pulse(bpm): 77 Weight(lbs): 153 Blood Pressure(mmHg): 120/74 Body Mass Index(BMI): 24 Temperature(F): 98.8 Respiratory Rate(breaths/min): 18 Photos: [2:No Photos] [N/A:N/A] Wound Location: [2:Posterior Foot] [N/A:N/A] Wounding Event: [2:Trauma] [N/A:N/A] Primary Etiology: [2:Abscess] [N/A:N/A] Comorbid History: [2:Anemia, Asthma, History of pressure wounds, Osteomyelitis, Neuropathy] [N/A:N/A] Date Acquired: [2:06/18/2021] [N/A:N/A] Weeks of Treatment: [2:6] [N/A:N/A] Wound Status: [2:Open] [N/A:N/A] Wound Recurrence: [2:No] [N/A:N/A] Measurements L x W x D (cm) [2:0.4x0.4x0.2] [N/A:N/A] Area (cm) : [2:0.126] [N/A:N/A] Volume (cm) : [2:0.025] [N/A:N/A] % Reduction in Area: [2:82.20%] [N/A:N/A] % Reduction in Volume: [2:91.20%] [N/A:N/A] Classification: [2:Full Thickness Without Exposed Support Structures] [N/A:N/A] Exudate Amount: [2:Medium] [N/A:N/A] Exudate Type: [2:Serosanguineous] [N/A:N/A] Exudate Color: [2:red, brown] [N/A:N/A] Granulation Amount: [2:Large (67-100%)] [N/A:N/A] Granulation Quality: [2:Pink] [N/A:N/A] Necrotic Amount: [2:Small (1-33%)] [N/A:N/A] Exposed Structures: [2:Fat Layer (Subcutaneous Tissue): Yes Fascia: No Tendon: No Muscle: No Joint: No Bone: No None]  [N/A:N/A N/A] Treatment Notes Electronic Signature(s) Signed: 10/08/2021 11:41:41 AM By: [name] Entered By: [name] on 10/05/2021 10:21:15 [name], Kelly M. ([id number]) -------------------------------------------------------------------------------- [place] Details Patient Name: [name]. Date of Service: 10/05/2021 9:45 AM Medical Record Number: [id number] Patient Account Number: [id number] Date of Birth/Sex: December 11, 1975 (46 y.o. F) Treating RN: [name] Primary Care Kaprice Kage: [name] Other Clinician: [name] Referring Dayana Dalporto: [name] Treating Kyran Whittier/Extender: [name] in Treatment: 6 Active Inactive Wound/Skin Impairment Nursing Diagnoses: Knowledge deficit related to ulceration/compromised skin integrity Goals: Patient/caregiver will verbalize understanding of skin care regimen Date Initiated: 08/20/2021 Target Resolution Date: 09/20/2021 Goal Status: Active Ulcer/skin breakdown will have a volume reduction of 30% by week 4 Date Initiated: 08/20/2021 Target Resolution Date: 09/20/2021 Goal Status: Active Ulcer/skin breakdown will have a volume reduction of 50% by week 8 Date Initiated: 08/20/2021 Target Resolution  Date: 10/20/2021 Goal Status: Active Ulcer/skin breakdown will have a volume reduction of 80% by week 12 Date Initiated: 08/20/2021 Target Resolution Date: 11/20/2021 Goal Status: Active Ulcer/skin breakdown will heal within 14 weeks Date Initiated: 08/20/2021 Target Resolution Date: 12/20/2021 Goal Status: Active Interventions: Assess patient/caregiver ability to obtain necessary supplies Assess patient/caregiver ability to perform ulcer/skin care regimen upon admission and as needed Assess ulceration(s) every visit Notes: Electronic Signature(s) Signed: 10/05/2021 2:31:54 PM By: [name] RN Signed: 10/08/2021 11:41:41 AM By: [name] Entered By: [name] on 10/05/2021  10:21:08 [name], Kelly M. ([id number]) -------------------------------------------------------------------------------- Pain Assessment Details Patient Name: [name]. Date of Service: 10/05/2021 9:45 AM Medical Record Number: [id number] Patient Account Number: [id number] Date of Birth/Sex: 01/05/1976 (46 y.o. F) Treating RN: [name] Primary Care Darrel Gloss: [name] Other Clinician: [name] Referring Walden Statz: [name] Treating Ruey Storer/Extender: [name] in Treatment: 6 Active Problems Location of Pain Severity and Description of Pain Patient Has Paino No Site Locations Pain Management and Medication Current Pain Management: Electronic Signature(s) Signed: 10/05/2021 2:31:54 PM By: [name] RN Signed: 10/08/2021 11:41:41 AM By: [name] Entered By: [name] on 10/05/2021 10:17:13 [name], Kelly M. ([id number]) -------------------------------------------------------------------------------- Patient/Caregiver Education Details Patient Name: [name]. Date of Service: 10/05/2021 9:45 AM Medical Record Number: [id number] Patient Account Number: [id number] Date of Birth/Gender: [birth year] (46 y.o. F) Treating RN: [name] Primary Care Physician: [name] Other Clinician: [name] Referring Physician: [name] Treating Physician/Extender: [name] in Treatment: 6 Education Assessment Education Provided To: Patient Education Topics Provided Wound/Skin Impairment: Methods: Explain/Verbal Responses: State content correctly Electronic Signature(s) Signed: 10/08/2021 11:41:41 AM By: [name] Entered By: [name] on 10/05/2021 10:34:50 [name], Kelly M. ([id number]) -------------------------------------------------------------------------------- Wound Assessment Details Patient Name: [name]. Date of Service: 10/05/2021 9:45 AM Medical Record Number:  [id number] Patient Account Number: [id number] Date of Birth/Sex: 06/10/1975 (46 y.o. F) Treating RN: [name] Primary Care Mikailah Morel: [name] Other Clinician: [name] Referring Tyasia Packard: [name] Treating Roman Dubuc/Extender: [name] in Treatment: 6 Wound Status Wound Number: 2 Primary Abscess Etiology: Wound Location: Posterior Foot Wound Status: Open Wounding Event: Trauma Comorbid Anemia, Asthma, History of pressure wounds, Date Acquired: 06/18/2021 History: Osteomyelitis, Neuropathy Weeks Of Treatment: 6 Clustered Wound: No Wound Measurements Length: (cm) 0.4 Width: (cm) 0.4 Depth: (cm) 0.2 Area: (cm) 0.126 Volume: (cm) 0.025 % Reduction in Area: 82.2% % Reduction in Volume: 91.2% Epithelialization: None Wound Description Classification: Full Thickness Without Exposed Support Structures Exudate Amount: Medium Exudate Type: Serosanguineous Exudate Color: red, brown Foul Odor After Cleansing: No Slough/Fibrino No Wound Bed Granulation Amount: Large (67-100%) Exposed Structure Granulation Quality: Pink Fascia Exposed: No Necrotic Amount: Small (1-33%) Fat Layer (Subcutaneous Tissue) Exposed: Yes Necrotic Quality: Adherent Slough Tendon Exposed: No Muscle Exposed: No Joint Exposed: No Bone Exposed: No Treatment Notes Wound #2 (Foot) Wound Laterality: Posterior Cleanser [place] Ancillary Kit - 15 Day Supply Discharge Instruction: Use supplies as instructed; Kit contains: (15) Saline Bullets; (15) 3x3 Gauze; 15 pr Gloves Soap and Water Discharge Instruction: Gently cleanse wound with antibacterial soap, rinse and pat dry prior to dressing wounds Peri-Wound Care Topical Gentamicin Discharge Instruction: Apply as directed by Meleane Selinger. Primary Dressing Hydrofera Blue Ready Transfer Foam, 4x5 (in/in) Discharge Instruction: Apply Hydrofera Blue Ready to wound bed as directed Secondary Dressing (BORDER) Zetuvit Plus SILICONE BORDER  Dressing 4x4 (in/in) Discharge Instruction: Please do not put silicone bordered dressings under wraps. Use non-bordered dressing only. [name], [name] ([id number]) Secured  With Compression Wrap Compression Stockings Add-Ons Electronic Signature(s) Signed: 10/05/2021 2:31:54 PM By: [name] RN Signed: 10/08/2021 11:41:41 AM By: [name] Entered By: [name] on 10/05/2021 10:19:56 [name], Kelly M. ([id number]) -------------------------------------------------------------------------------- Vitals Details Patient Name: [name]. Date of Service: 10/05/2021 9:45 AM Medical Record Number: [id number] Patient Account Number: [id number] Date of Birth/Sex: [birth year] (46 y.o. F) Treating RN: [name] Primary Care Rilda Bulls: [name] Other Clinician: [name] Referring Darrielle Pflieger: [name] Treating Nehemias Sauceda/Extender: [name] in Treatment: 6 Vital Signs Time Taken: 10:12 Temperature (F): 98.8 Height (in): 67 Pulse (bpm): 76 Weight (lbs): 153 Respiratory Rate (breaths/min): 18 Body Mass Index (BMI): 24 Blood Pressure (mmHg): 120/74 Reference Range: 80 - 120 mg / dl Electronic Signature(s) Signed: 10/08/2021 11:41:41 AM By: [name] Entered By: [name] on 10/05/2021 10:15:13

## 2021-10-12 ENCOUNTER — Ambulatory Visit: Payer: Medicare PPO | Admitting: Psychology

## 2021-10-13 ENCOUNTER — Ambulatory Visit (INDEPENDENT_AMBULATORY_CARE_PROVIDER_SITE_OTHER): Payer: Medicare PPO | Admitting: Psychology

## 2021-10-13 DIAGNOSIS — F331 Major depressive disorder, recurrent, moderate: Secondary | ICD-10-CM

## 2021-10-13 NOTE — Progress Notes (Signed)
[name] is a 46 y.o. female patient .  Diagnosis 296.30 (Major depressive affective disorder, recurrent episode, unspecified) [n/a]  Symptoms Depressed or irritable mood. (Status: maintained) -- No Description Entered  Diminished interest in or enjoyment of activities. (Status: maintained) -- No Description Entered  Feelings of hopelessness, worthlessness, or inappropriate guilt. (Status: maintained) -- No Description Entered  Lack of energy. (Status: maintained) -- No Description Entered  Unresolved grief issues. (Status: maintained) -- No Description Entered  Medication Status compliance  Safety none  If Suicidal or Homicidal State Action Taken: unspecified  Current Risk: low Medications Hydroxyzine (Dosage: unknown)  Remiron (Dosage: unknown)  Sertraline (Dosage: unknown)  Objectives Related Problem: Appropriately grieve the loss in order to normalize mood and to return to previously adaptive level of functioning. Description: Increasingly verbalize hopeful and positive statements regarding self, others, and the future. Target Date: 2022-01-13 Frequency: Daily Modality: individual Progress: 80%  Related Problem: Appropriately grieve the loss in order to normalize mood and to return to previously adaptive level of functioning. Description: Learn and implement conflict resolution skills to resolve interpersonal problems. Target Date: 2022-01-13 Frequency: Daily Modality: individual Progress: 80%  Related Problem: Appropriately grieve the loss in order to normalize mood and to return to previously adaptive level of functioning. Description: Learn and implement problem-solving and decision-making skills. Target Date: 2022-01-13 Frequency: Daily Modality: individual Progress: 80%  Related Problem: Appropriately grieve the loss in order to normalize mood and to return to previously adaptive level of functioning. Description: Describe current and past experiences  with depression including their impact on functioning and attempts to resolve it. Target Date: 2021-07-14 Frequency: Daily Modality: individual Progress: 100%-completed  Related Problem: Complete the process of letting go of the lost significant other. Description: Report decreased time spent each day focusing on the loss. Target Date: 2022-01-13 Frequency: Daily Modality: individual Progress: 80%  Related Problem: Complete the process of letting go of the lost significant other. Description: Decrease unrealistic thoughts, statements, and feelings of being responsible for the loss. Target Date: 2021-07-14 Frequency: Daily Modality: individual Progress: 100%-completed  Related Problem: Complete the process of letting go of the lost significant other. Description: Verbalize and resolve feelings of anger or guilt focused on self or deceased loved one that interfere with the grieving process. Target Date: 2021-07-14 Frequency: Daily Modality: individual Progress: 100%-completed  Related Problem: Complete the process of letting go of the lost significant other. Description: Begin verbalizing feelings associated with the loss. Target Date: 2022-01-13 Frequency: Daily Modality: individual Progress: 80%  Related Problem: Complete the process of letting go of the lost significant other. Description: Identify what stages of grief have been experienced in the continuum of the grieving process. Target Date: 2021-07-14 Frequency: Daily Modality: individual Progress: 100%-completed  Related Problem: Complete the process of letting go of the lost significant other. Description: Participate in a therapy that addresses issues beyond grief that have arisen as a result of the loss. Target Date: 2022-01-13 Frequency: Daily Modality: individual Progress: 80%  Related Problem: Complete the process of letting go of the lost significant other. Description: Tell in detail the story of the  current loss that is triggering symptoms. Target Date: 2021-07-14 Frequency: Daily Modality: individual Progress: 100%-completed  Client Response full compliance  Service Location Location, [address] [place],  [address]  Service Code cpt [phone]  Validate/empathize  Related past to present  Behavioral activation plan  Facilitate problem solving  Identify automatic thoughts  Rationally challenge thoughts or beliefs/cognitive restructuring  Identify/label  emotions  Emotion regulation skills  Psychiatrist  Self care activities  Self-monitoring  Session Notes: Patient requests a video Webex session due to the coronavirus. She is at home and I am at my home office.  Dx: Major Depression  Meds: Lexapro ('20mg'$ ), Busperone (7.5)  Goals: States that she is seeking counseling to learn to manage the grief reaction to losing her husband. She reports feeling tired all the time and that she has no energy or motivation. Needs to develop coping strategies. Therapy to focus on reducing despair, sadness, hopelessness and helplessness. 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 Will explore those early traumas/losses and current triggers. Goal dates: 04-2021 Revised Goals: Patient states that she has been busy and feeling "pretty good". Says she is trying to get better at setting limits, but feels that her parents resist. Suggested that she be more direct about her intentions.  She has been spending time with the grandchildren, which brings her great joy. [name] says that she has been feeling less depressed and feels that she is getting into a better rhythm. It has been a less eventful period of time and she continues to feel that her relationship with her family is disruptive and dysfunctional. She is invested, however, in creating a healthier relationship. We had technical difficulties throughout the call and ended the session a  little short after trying several different technologies.                            [name], PhD Time: 7:40a-8:25a 45 minutes

## 2021-10-19 ENCOUNTER — Encounter: Payer: Medicare PPO | Attending: Physician Assistant | Admitting: Physician Assistant

## 2021-10-19 DIAGNOSIS — G8929 Other chronic pain: Secondary | ICD-10-CM | POA: Insufficient documentation

## 2021-10-19 DIAGNOSIS — G600 Hereditary motor and sensory neuropathy: Secondary | ICD-10-CM | POA: Diagnosis not present

## 2021-10-19 DIAGNOSIS — G7100 Muscular dystrophy, unspecified: Secondary | ICD-10-CM | POA: Diagnosis not present

## 2021-10-19 DIAGNOSIS — L02415 Cutaneous abscess of right lower limb: Secondary | ICD-10-CM | POA: Diagnosis not present

## 2021-10-19 DIAGNOSIS — M86671 Other chronic osteomyelitis, right ankle and foot: Secondary | ICD-10-CM | POA: Insufficient documentation

## 2021-10-19 DIAGNOSIS — L84 Corns and callosities: Secondary | ICD-10-CM | POA: Diagnosis not present

## 2021-10-19 DIAGNOSIS — L97512 Non-pressure chronic ulcer of other part of right foot with fat layer exposed: Secondary | ICD-10-CM | POA: Insufficient documentation

## 2021-10-19 NOTE — Progress Notes (Addendum)
Kelly, [name] ([id number]) Visit Report for 10/19/2021 Chief Complaint Document Details Patient Name: [name], [name]. Date of Service: 10/19/2021 9:45 AM Medical Record Number: [id number] Patient Account Number: [id number] Date of Birth/Sex: 1975-11-21 (46 y.o. F) Treating RN: Cornell Barman Primary Care Provider: [name] Other Clinician: [name] Referring Provider: [name] Treating Provider/Extender: [name] in Treatment: 8 Information Obtained from: Patient Chief Complaint Right foot ulcer Electronic Signature(s) Signed: 10/19/2021 10:09:44 AM By: [name] Entered By: Worthy Keeler on 10/19/2021 10:09:44 Doggett, Alveta Jerilynn Mages ([id number]) -------------------------------------------------------------------------------- Debridement Details Patient Name: [name]. Date of Service: 10/19/2021 9:45 AM Medical Record Number: [id number] Patient Account Number: [id number] Date of Birth/Sex: [birth year] (46 y.o. F) Treating RN: Cornell Barman Primary Care Provider: [name] Other Clinician: [name] Referring Provider: [name] Treating Provider/Extender: [name] in Treatment: 8 Debridement Performed for Wound #2 Posterior Foot Assessment: Performed By: Physician [name], [name] Debridement Type: Debridement Level of Consciousness (Pre- Awake and Alert procedure): Pre-procedure Verification/Time Out Yes - 10:13 Taken: Start Time: 10:13 Total Area Debrided (L x W): 1 (cm) x 1 (cm) = 1 (cm) Tissue and other material Viable, Non-Viable, Callus, Slough, Subcutaneous, Slough debrided: Level: Skin/Subcutaneous Tissue Debridement Description: Excisional Instrument: Curette Bleeding: Minimum Hemostasis Achieved: Pressure End Time: 10:17 Response to Treatment: Procedure was tolerated well Level of Consciousness (Post- Awake and Alert procedure): Post Debridement Measurements of Total  Wound Length: (cm) 0.3 Width: (cm) 0.3 Depth: (cm) 0.3 Volume: (cm) 0.021 Character of Wound/Ulcer Post Debridement: Stable Post Procedure Diagnosis Same as Pre-procedure Electronic Signature(s) Signed: 10/19/2021 1:10:05 PM By: Gretta Cool, BSN, RN, CWS, Kim RN, BSN Signed: 10/19/2021 5:04:14 PM By: [name] Signed: 10/19/2021 5:13:16 PM By: [name] Entered By: [name] on 10/19/2021 10:16:50 Cu, Gabriellah M. ([id number]) -------------------------------------------------------------------------------- HPI Details Patient Name: [name]. Date of Service: 10/19/2021 9:45 AM Medical Record Number: [id number] Patient Account Number: [id number] Date of Birth/Sex: [birth year] (46 y.o. F) Treating RN: Cornell Barman Primary Care Provider: [name] Other Clinician: [name] Referring Provider: [name] Treating Provider/Extender: [name] in Treatment: 8 History of Present Illness Location: right first metatarsal head plantar aspect Quality: Patient reports experiencing a dull pain to affected area(s). Severity: Patient states wound are getting worse. Duration: Patient has had the wound for > 2 months prior to seeking treatment at the wound center Timing: Pain in wound is Intermittent (comes and goes Context: The wound appeared gradually over time Modifying Factors: Other treatment(s) tried include:pain clinic for her narcotics Associated Signs and Symptoms: Patient reports having difficulty standing for long periods. HPI Description: 46 year old patient sent to [name] by her PCP 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 Her notes were reviewed and there was extensive history of opioid treatment and management by the [facility] pain clinic from where she has been  terminated. X-ray of the right foot foot was done on 03/13/2015 and was suggestive of active infection and possible osteomyelitis. her past surgical history includes amputation of the left big toe in 2011 and amputation of the right big toe in 2015. She was sitting the wound clinic at [facility] and we will try and obtain these notes. She is a smoker and smokes about 15 cigarettes a day. 06/12/2015 - x-ray of the right foot -- IMPRESSION:1. Soft  tissue swelling of the plantar surface of the foot. No underlying acute abnormality. 2. Amputation deformity again of the right great toe. Old healed bony deformity noted the right second metatarsal. No acute bony abnormality identified. We have not received any reports from the [facility] hospital yet. She continues to smoke at least 10 cigarettes a day 06/19/2015 -- she has increasing pain in the right foot and also has had some low-grade fever. Her vascular test is scheduled for tomorrow and her MRIs not till next week. Readmission: 08-20-2021 upon evaluation today patient presents for initial inspection here in our clinic although she has been seen that was back in 2017. At that time she was having a issue with her right foot. With that being said upon inspection today she is actually having issues with the right foot as well on the medial aspect where she has some breakdown due to what I am assuming is pressure to the foot location. 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 I did review her culture report which showed evidence of bacteria including hemolytic Streptococcus group A as well as Staphylococcus aureus. The patient is currently on doxycycline and tells me that this is getting much better. Topical gentamicin is also being utilized. Also did review her arterial studies which  show that she has on the right and ABI of 1.15 which is triphasic and appears to be excellent there is no significant peripheral artery disease noted the left is also. With ABI of 1.11 and a good report in that regard as well. Lastly I did also review her MRI of the right forefoot which was performed on 07-06-2021. This shows that she does have mild subcortical marrow edema in the stump of the first metatarsal concerning for osteomyelitis. There is also a malunited fracture of the second metatarsal. The patient has bone marrow edema in the base of the second third fourth and fifth metatarsals concerning for stress reaction obviously this infection is going to be an issue for [name] here as well she needs to be treated appropriately and I do believe that the doxycycline is a good option right now although I may need to #1 extend this and #2 plan for even altering or adding to the treatment regimen depending on how things progress. Patient voiced understanding.Marland Kitchen 09-01-2021 upon evaluation today patient appears to be doing excellent in regard to her wound. She is actually showing signs of excellent improvement which is great news. Fortunately I do not see any signs of active infection locally or systemically at this point which is great news and overall I am extremely pleased with where things stand today. I do not see any evidence of active infection locally or systemically. 09-07-2021 upon evaluation today patient appears to be doing well with regard to her foot ulcer. This is actually showing signs of improvement which is great news. Fortunately there does not appear to be any signs of active infection locally or systemically at this time which is as well. 09-22-2021 upon evaluation today patient appears to be doing well currently in regard to her wound. This is measuring smaller and looking better were slowly making progress here. Fortunately there does not appear to be any evidence of active infection locally  or systemically at this time. Upon inspection patient's wound bed showed evidence of good granulation and epithelization at  this point. 10-05-2021 upon evaluation today patient's wound shows a lot of callus buildup but the surface of the wound is actually doing quite well. She is going require some sharp debridement to clearway some of the necrotic debris. 10-19-2021 upon evaluation today patient appears to be doing better in regard to her wound which is actually measuring little bit smaller today. Fortunately there does not appear to be any signs of significant infection she does have quite a bit of callus we will get have to work on that today. Otherwise I think we are headed in the appropriate direction. Electronic Signature(s) Signed: 10/19/2021 10:22:08 AM By: [name] Entered By: Worthy Keeler on 10/19/2021 10:22:08 [name] ([id number]) Sherlean Foot, Anwen M. ([id number]) -------------------------------------------------------------------------------- Physical Exam Details Patient Name: [name]. Date of Service: 10/19/2021 9:45 AM Medical Record Number: [id number] Patient Account Number: [id number] Date of Birth/Sex: 10/04/1975 (46 y.o. F) Treating RN: Cornell Barman Primary Care Provider: [name] Other Clinician: [name] Referring Provider: [name] Treating Provider/Extender: [name] in Treatment: 8 Constitutional Well-nourished and well-hydrated in no acute distress. Respiratory normal breathing without difficulty. Psychiatric this patient is able to make decisions and demonstrates good insight into disease process. Alert and Oriented x 3. pleasant and cooperative. Notes Upon inspection patient's wound bed actually showed signs of good granulation and epithelization at this point. Fortunately I see no evidence of active infection locally or systemically which is great news and overall I am extremely pleased with where we  stand today. Electronic Signature(s) Signed: 10/19/2021 10:22:23 AM By: [name] Entered By: Worthy Keeler on 10/19/2021 10:22:23 [name] ([id number]) -------------------------------------------------------------------------------- Physician Orders Details Patient Name: [name]. Date of Service: 10/19/2021 9:45 AM Medical Record Number: [id number] Patient Account Number: [id number] Date of Birth/Sex: 1975-10-23 (46 y.o. 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 Bathing/ Shower/ Hygiene o May shower with wound dressing protected with water repellent cover or cast protector. o No tub bath. Anesthetic (Use 'Patient Medications' Section for Anesthetic Order Entry) o Lidocaine applied to wound bed Edema Control - Lymphedema / Segmental Compressive Device / Other o Elevate, Exercise Daily and Avoid Standing for Long Periods of Time. o Elevate legs to the level of the heart and pump ankles as often as possible o Elevate leg(s) parallel to the floor when sitting. Medications-Please add to medication list. Wound #2 Posterior Foot o Take one 529m Tylenol (Acetaminophen) and one 2091mMotrin (Ibuprofen) every 6 hours for pain. Do not take ibuprofen if you are on blood thinners or have stomach ulcers. - take every 4 hours while awake Wound Treatment Wound #2 - Foot Wound Laterality: Posterior Cleanser: Byram  Ancillary Kit - 15 Day Supply (Generic) 3 x Per Day/30 Days Discharge Instructions: Use supplies as instructed; Kit contains: (15) Saline Bullets; (15) 3x3 Gauze; 15 pr Gloves Cleanser: Soap and Water 3 x Per Day/30 Days Discharge Instructions: Gently cleanse wound with antibacterial soap, rinse and pat dry prior to dressing wounds Topical: Gentamicin 3 x Per Day/30 Days Discharge Instructions:  Apply as directed by provider. Primary Dressing: Hydrofera Blue Ready Transfer Foam, 4x5 (in/in) (Generic) 3 x Per Day/30 Days Discharge Instructions: Apply Hydrofera Blue Ready to wound bed as directed Secondary Dressing: (BORDER) Zetuvit Plus SILICONE BORDER Dressing 4x4 (in/in) (Dispense As Written) 3 x Per Day/30 Days Discharge Instructions: Please do not put silicone bordered dressings under wraps. Use non-bordered dressing only. Electronic Signature(s) Signed: 10/19/2021 5:04:14 PM By: [name] Signed: 10/19/2021 5:13:16 PM By: [name] Entered By: [name] on 10/19/2021 10:17:01 Xiang, Clytee M. ([id number]) Scarpino, Vanya M. ([id number]) -------------------------------------------------------------------------------- Problem List Details Patient Name: [name]. Date of Service: 10/19/2021 9:45 AM Medical Record Number: [id number] Patient Account Number: [id number] Date of Birth/Sex: 03-27-1975 (46 y.o. 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 ([id number]) -------------------------------------------------------------------------------- Progress Note Details Patient Name: [name]. Date of Service: 10/19/2021 9:45 AM Medical Record Number: [id number] Patient Account Number: [id number] Date of Birth/Sex: December 11, 1975 (46 y.o. F) Treating RN: Cornell Barman Primary Care Provider: [name] Other Clinician: [name] Referring Provider: [name] Treating Provider/Extender: [name] in Treatment: 8 Subjective Chief Complaint Information obtained from Patient Right foot ulcer History of Present Illness (HPI) The following HPI elements were documented for the patient's wound: Location: right first metatarsal head plantar aspect Quality: Patient reports experiencing a dull pain to affected area(s). Severity: Patient states wound are getting worse. Duration: Patient has had the wound for > 2 months prior to seeking treatment at the wound center Timing: Pain in wound is Intermittent (comes and goes Context: The wound appeared gradually over time Modifying Factors: Other treatment(s) tried include:pain clinic for her narcotics Associated Signs and Symptoms: Patient reports having difficulty standing for long periods. 46 year old patient sent to [name] by her PCP 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 Her notes were reviewed and there was extensive history of opioid treatment and management by the [facility] pain clinic from where she has been terminated. X-ray of the right foot foot was done on 03/13/2015 and  was suggestive of active infection and possible osteomyelitis. her past surgical history includes amputation of the left big toe in 2011 and amputation of the right big toe in 2015. She was sitting the wound clinic at [facility] and we will try and obtain these notes. She is a smoker and smokes about 15 cigarettes a day. 06/12/2015 - x-ray of the right foot -- IMPRESSION:1. Soft tissue swelling of the plantar surface of the foot. No underlying acute abnormality. 2. Amputation deformity again of the right great toe. Old healed bony deformity noted the right second metatarsal. No acute bony abnormality identified. We have not received any reports from the [facility] hospital yet. She continues to smoke at  least 10 cigarettes a day 06/19/2015 -- she has increasing pain in the right foot and also has had some low-grade fever. Her vascular test is scheduled for tomorrow and her MRIs not till next week. Readmission: 08-20-2021 upon evaluation today patient presents for initial inspection here in our clinic although she has been seen that was back in 2017. At that time she was having a issue with her right foot. With that being said upon inspection today she is actually having issues with the right foot as well on the medial aspect where she has some breakdown due to what I am assuming is pressure to the foot location. 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 I did review her culture report which showed evidence of bacteria including hemolytic Streptococcus group A as well as Staphylococcus aureus. The patient is currently on doxycycline and tells me that this is getting much better. Topical gentamicin is also being utilized. Also did review her arterial studies which show that she has on the right and ABI of 1.15 which is triphasic and  appears to be excellent there is no significant peripheral artery disease noted the left is also. With ABI of 1.11 and a good report in that regard as well. Lastly I did also review her MRI of the right forefoot which was performed on 07-06-2021. This shows that she does have mild subcortical marrow edema in the stump of the first metatarsal concerning for osteomyelitis. There is also a malunited fracture of the second metatarsal. The patient has bone marrow edema in the base of the second third fourth and fifth metatarsals concerning for stress reaction obviously this infection is going to be an issue for [name] here as well she needs to be treated appropriately and I do believe that the doxycycline is a good option right now although I may need to #1 extend this and #2 plan for even altering or adding to the treatment regimen depending on how things progress. Patient voiced understanding.Marland Kitchen 09-01-2021 upon evaluation today patient appears to be doing excellent in regard to her wound. She is actually showing signs of excellent improvement which is great news. Fortunately I do not see any signs of active infection locally or systemically at this point which is great news and overall I am extremely pleased with where things stand today. I do not see any evidence of active infection locally or systemically. 09-07-2021 upon evaluation today patient appears to be doing well with regard to her foot ulcer. This is actually showing signs of improvement which is great news. Fortunately there does not appear to be any signs of active infection locally or systemically at this time which is as well. 09-22-2021 upon evaluation today patient appears to be doing well currently in regard to her wound. This is measuring smaller and looking better were slowly making progress here. Fortunately there does not appear to be any evidence of active infection locally or systemically at this time. Upon inspection patient's wound bed  showed evidence of good granulation and epithelization at this point. 10-05-2021 upon evaluation today patient's wound shows a lot of callus buildup but the surface of the wound is actually doing quite well. She is going require some sharp debridement to clearway some of the necrotic debris. 10-19-2021 upon evaluation today patient appears to be doing better in regard to  her wound which is actually measuring little bit smaller today. Fortunately there does not appear to be any signs of significant infection she does have quite a bit of callus we will get have to work on that WILHEMINA, GRALL. ([id number]) today. Otherwise I think we are headed in the appropriate direction. Objective Constitutional Well-nourished and well-hydrated in no acute distress. Vitals Time Taken: 10:01 AM, Height: 67 in, Weight: 153 lbs, BMI: 24, Temperature: 98.4 F, Pulse: 88 bpm, Respiratory Rate: 16 breaths/min, Blood Pressure: 123/71 mmHg. Respiratory normal breathing without difficulty. Psychiatric this patient is able to make decisions and demonstrates good insight into disease process. Alert and Oriented x 3. pleasant and cooperative. General Notes: Upon inspection patient's wound bed actually showed signs of good granulation and epithelization at this point. Fortunately I see no evidence of active infection locally or systemically which is great news and overall I am extremely pleased with where we stand today. Integumentary (Hair, Skin) Wound #2 status is Open. Original cause of wound was Trauma. The date acquired was: 06/18/2021. The wound has been in treatment 8 weeks. The wound is located on the Posterior Foot. The wound measures 0.3cm length x 0.3cm width x 0.2cm depth; 0.071cm^2 area and 0.014cm^3 volume. There is Fat Layer (Subcutaneous Tissue) exposed. There is a medium amount of serosanguineous drainage noted. There is large (67- 100%) pink granulation within the wound bed. There is a small (1-33%) amount  of necrotic tissue within the wound bed including Adherent Slough. 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 There was a Excisional Skin/Subcutaneous Tissue Debridement with a total area of 1 sq cm performed by [name], [name]. With the following instrument(s): Curette to remove Viable and Non-Viable tissue/material. Material removed includes Callus, Subcutaneous Tissue, and Slough. A time out was conducted at 10:13, prior to the start of the procedure. A Minimum amount of bleeding was controlled with Pressure. The procedure was tolerated well. Post Debridement Measurements: 0.3cm length x 0.3cm width x 0.3cm depth; 0.021cm^3 volume. Character of Wound/Ulcer Post Debridement is stable. Post procedure Diagnosis Wound #2: Same as Pre-Procedure Plan Follow-up Appointments: RAIVYN, KABLER ([id number]) Return Appointment in 1 week. Bathing/ Shower/ Hygiene: May shower with wound dressing protected with water repellent cover or cast protector. No tub bath. Anesthetic (Use 'Patient Medications' Section for Anesthetic Order Entry): Lidocaine applied to wound bed Edema Control - Lymphedema / Segmental Compressive Device / Other: Elevate, Exercise Daily and Avoid Standing for Long Periods of Time. Elevate legs to the level of the heart and pump ankles as often as possible Elevate leg(s) parallel to the floor when sitting. Medications-Please add to medication list.: Wound #2 Posterior Foot: Take one 515m Tylenol (Acetaminophen) and one 2051mMotrin (Ibuprofen) every 6 hours for pain. Do not take ibuprofen if you are on blood thinners or have stomach ulcers. - take every 4  hours while awake WOUND #2: - Foot Wound Laterality: Posterior Cleanser: Byram Ancillary Kit - 15 Day Supply (Generic) 3 x Per Day/30 Days Discharge Instructions: Use supplies as instructed; Kit contains: (15) Saline Bullets; (15) 3x3 Gauze; 15 pr Gloves Cleanser: Soap and Water 3 x Per Day/30 Days Discharge Instructions: Gently cleanse wound with antibacterial soap, rinse and pat dry prior to  dressing wounds Topical: Gentamicin 3 x Per Day/30 Days Discharge Instructions: Apply as directed by provider. Primary Dressing: Hydrofera Blue Ready Transfer Foam, 4x5 (in/in) (Generic) 3 x Per Day/30 Days Discharge Instructions: Apply Hydrofera Blue Ready to wound bed as directed Secondary Dressing: (BORDER) Zetuvit Plus SILICONE BORDER Dressing 4x4 (in/in) (Dispense As Written) 3 x Per Day/30 Days Discharge Instructions: Please do not put silicone bordered dressings under wraps. Use non-bordered dressing only. 1. I would recommend that we go ahead and continue with the recommendation for wound care measures as before and the patient is in agreement with the plan. This includes the use of the gentamicin followed by the [facility] which I think is doing a good job. 2. Also can recommend a bordered foam dressing to cover. 3. I would also suggest patient continue to monitor for any signs of worsening or infection if anything changes she should let me know. We will see patient back for reevaluation in 1 week here in the clinic. If anything worsens or changes patient will contact our office for additional recommendations. Electronic Signature(s) Signed: 10/19/2021 10:22:51 AM By: [name] Entered By: Worthy Keeler on 10/19/2021 10:22:50 Backlund, Joey Jerilynn Mages ([id number]) -------------------------------------------------------------------------------- SuperBill Details Patient Name: [name]. Date of Service: 10/19/2021 Medical Record Number: [id number] Patient Account Number:  [id number] Date of Birth/Sex: November 05, 1975 (46 y.o. 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 L84 Corns and callosities Facility Procedures CPT4 Code: [id number] Description: 32023 - DEB SUBQ TISSUE 20 SQ CM/< Modifier: Quantity: 1 CPT4 Code: Description: ICD-10 Diagnosis Description L97.512 Non-pressure chronic ulcer of other part of right foot with fat layer ex Modifier: posed Quantity: Physician Procedures CPT4 Code: [id number] Description: 11042 - WC PHYS SUBQ TISS 20 SQ CM Modifier: Quantity: 1 CPT4 Code: Description: ICD-10 Diagnosis Description L97.512 Non-pressure chronic ulcer of other part of right foot with fat layer ex Modifier: posed Quantity: Electronic Signature(s) Signed: 10/19/2021 10:23:04 AM By: [name] Entered By: Worthy Keeler on 10/19/2021 10:23:04

## 2021-10-20 NOTE — Progress Notes (Signed)
BREELLA, VANOSTRAND ([id number]) Visit Report for 10/19/2021 Arrival Information Details Patient Name: [name], [name]. Date of Service: 10/19/2021 9:45 AM Medical Record Number: [id number] Patient Account Number: [id number] Date of Birth/Sex: 05-07-1975 (46 y.o. F) Treating RN: Cornell Barman Primary Care Minerva Bluett: [name] Other Clinician: [name] Referring Ruchy Wildrick: [name] Treating Yamila Cragin/Extender: [name] in Treatment: 8 Visit Information History Since Last Visit All ordered tests and consults were completed: No Patient Arrived: Ambulatory Added or deleted any medications: No Arrival Time: 10:00 Any new allergies or adverse reactions: No Transfer Assistance: None Had a fall or experienced change in No Patient Requires Transmission-Based Precautions: No activities of daily living that may affect Patient Has Alerts: Yes risk of falls: Patient Alerts: ABI R 1.15 02/04/21 Hospitalized since last visit: No ABI L 1.11 02/04/21 Pain Present Now: No Electronic Signature(s) Signed: 10/19/2021 5:04:14 PM By: [name] Entered By: [name] on 10/19/2021 10:00:57 Petta, Edit M. ([id number]) -------------------------------------------------------------------------------- Clinic Level of Care Assessment Details Patient Name: [name]. Date of Service: 10/19/2021 9:45 AM Medical Record Number: [id number] Patient Account Number: [id number] Date of Birth/Sex: 1975-10-23 (46 y.o. F) Treating RN: Cornell Barman Primary Care Tinaya Ceballos: [name] Other Clinician: [name] Referring Soren Pigman: [name] Treating Tonie Vizcarrondo/Extender: [name] in Treatment: 8 Clinic Level of Care Assessment Items TOOL 1 Quantity Score []  - Use when EandM and Procedure is performed on INITIAL visit 0 ASSESSMENTS - Nursing Assessment / Reassessment []  - General Physical Exam (combine w/ comprehensive assessment (listed just below) when  performed on new 0 pt. evals) []  - 0 Comprehensive Assessment (HX, ROS, Risk Assessments, Wounds Hx, etc.) ASSESSMENTS - Wound and Skin Assessment / Reassessment []  - Dermatologic / Skin Assessment (not related to wound area) 0 ASSESSMENTS - Ostomy and/or Continence Assessment and Care []  - Incontinence Assessment and Management 0 []  - 0 Ostomy Care Assessment and Management (repouching, etc.) PROCESS - Coordination of Care []  - Simple Patient / Family Education for ongoing care 0 []  - 0 Complex (extensive) Patient / Family Education for ongoing care []  - 0 Staff obtains Programmer, systems, Records, Test Results / Process Orders []  - 0 Staff telephones HHA, Nursing Homes / Clarify orders / etc []  - 0 Routine Transfer to another Facility (non-emergent condition) []  - 0 Routine Hospital Admission (non-emergent condition) []  - 0 New Admissions / Biomedical engineer / Ordering NPWT, Apligraf, etc. []  - 0 Emergency Hospital Admission (emergent condition) PROCESS - Special Needs []  - Pediatric / Minor Patient Management 0 []  - 0 Isolation Patient Management []  - 0 Hearing / Language / Visual special needs []  - 0 Assessment of Community assistance (transportation, D/C planning, etc.) []  - 0 Additional assistance / Altered mentation []  - 0 Support Surface(s) Assessment (bed, cushion, seat, etc.) INTERVENTIONS - Miscellaneous []  - External ear exam 0 []  - 0 Patient Transfer (multiple staff / Civil Service fast streamer / Similar devices) []  - 0 Simple Staple / Suture removal (25 or less) []  - 0 Complex Staple / Suture removal (26 or more) []  - 0 Hypo/Hyperglycemic Management (do not check if billed separately) []  - 0 Ankle / Brachial Index (ABI) - do not check if billed separately Has the patient been seen at the hospital within the last three years: Yes Total Score: 0 Level Of Care: ____ [name] ([id number]) Electronic Signature(s) Signed: 10/19/2021 5:04:14 PM By: [name] Entered By: [name] on 10/19/2021 10:17:06 Sannes, Mayan M. ([id number]) -------------------------------------------------------------------------------- Encounter Discharge Information Details Patient Name:  [name], [name] Date of Service: 10/19/2021 9:45 AM Medical Record Number: [id number] Patient Account Number: [id number] Date of Birth/Sex: 05/23/1975 (46 y.o. F) Treating RN: Cornell Barman Primary Care Tariyah Pendry: [name] Other Clinician: [name] Referring Seeley Hissong: [name] Treating Shaquala Broeker/Extender: [name] in Treatment: 8 Encounter Discharge Information Items Post Procedure Vitals Discharge Condition: Stable Temperature (F): 98.4 Ambulatory Status: Ambulatory Pulse (bpm): 88 Discharge Destination: Home Respiratory Rate (breaths/min): 16 Transportation: Private Auto Blood Pressure (mmHg): 123/71 Accompanied By: self Schedule Follow-up Appointment: Yes Clinical Summary of Care: Electronic Signature(s) Signed: 10/19/2021 5:04:14 PM By: [name] Entered By: [name] on 10/19/2021 10:23:17 Stankus, Jaidence M. ([id number]) -------------------------------------------------------------------------------- Lower Extremity Assessment Details Patient Name: [name]. Date of Service: 10/19/2021 9:45 AM Medical Record Number: [id number] Patient Account Number: [id number] Date of Birth/Sex: Jun 13, 1975 (46 y.o. F) Treating RN: Cornell Barman Primary Care Marlan Steward: [name] Other Clinician: [name] Referring Eller Sweis: [name] Treating Lula Kolton/Extender: [name] in Treatment: 8 Edema Assessment Assessed: [Left: No] [Right: No] [Left: Edema] [Right: :] Calf Left: Right: Point of Measurement: 30 cm From Medial Instep Ankle Left: Right: Point of Measurement: 10 cm From Medial Instep Electronic Signature(s) Signed: 10/19/2021 1:10:05 PM By: Gretta Cool, BSN, RN, CWS, Kim RN, BSN Signed:  10/19/2021 5:04:14 PM By: [name] Entered By: [name] on 10/19/2021 10:08:53 Brunelli, Mady M. ([id number]) -------------------------------------------------------------------------------- Multi Wound Chart Details Patient Name: [name]. Date of Service: 10/19/2021 9:45 AM Medical Record Number: [id number] Patient Account Number: [id number] Date of Birth/Sex: [birth year] (46 y.o. F) Treating RN: Cornell Barman Primary Care Leesha Veno: [name] Other Clinician: [name] Referring Artemis Koller: [name] Treating Braeley Buskey/Extender: [name] in Treatment: 8 Vital Signs Height(in): 67 Pulse(bpm): 88 Weight(lbs): 153 Blood Pressure(mmHg): 123/71 Body Mass Index(BMI): 24 Temperature(F): 98.4 Respiratory Rate(breaths/min): 16 Photos: [N/A:N/A] Wound Location: Posterior Foot N/A N/A Wounding Event: Trauma N/A N/A Primary Etiology: Abscess N/A N/A Comorbid History: Anemia, Asthma, History of N/A N/A pressure wounds, Osteomyelitis, Neuropathy Date Acquired: 06/18/2021 N/A N/A Weeks of Treatment: 8 N/A N/A Wound Status: Open N/A N/A Wound Recurrence: No N/A N/A Measurements L x W x D (cm) 0.3x0.3x0.2 N/A N/A Area (cm) : 0.071 N/A N/A Volume (cm) : 0.014 N/A N/A % Reduction in Area: 90.00% N/A N/A % Reduction in Volume: 95.10% N/A N/A Classification: Full Thickness Without Exposed N/A N/A Support Structures Exudate Amount: Medium N/A N/A Exudate Type: Serosanguineous N/A N/A Exudate Color: red, brown N/A N/A Granulation Amount: Large (67-100%) N/A N/A Granulation Quality: Pink N/A N/A Necrotic Amount: Small (1-33%) N/A N/A Exposed Structures: Fat Layer (Subcutaneous Tissue): N/A N/A Yes Fascia: No Tendon: No Muscle: No Joint: No Bone: No Epithelialization: None N/A N/A Treatment Notes Electronic Signature(s) Signed: 10/19/2021 5:04:14 PM By: [name] Entered By: [name] on 10/19/2021 10:09:10 Villegas, Shatisha M.  ([id number]) -------------------------------------------------------------------------------- Zeigler Details Patient Name: [name]. Date of Service: 10/19/2021 9:45 AM Medical Record Number: [id number] Patient Account Number: [id number] Date of Birth/Sex: 1976-01-15 (46 y.o. F) Treating RN: Cornell Barman Primary Care Dejanira Pamintuan: [name] Other Clinician: [name] Referring Tawonda Legaspi: [name] Treating Mazin Emma/Extender: [name] in Treatment: 8 Active Inactive Wound/Skin Impairment Nursing Diagnoses: Knowledge deficit related to ulceration/compromised skin integrity Goals: Patient/caregiver will verbalize understanding of skin care regimen Date Initiated: 08/20/2021 Target Resolution Date: 09/20/2021 Goal Status: Active Ulcer/skin breakdown will have a volume reduction of 30% by week 4 Date Initiated: 08/20/2021 Target Resolution Date: 09/20/2021 Goal Status: Active Ulcer/skin breakdown will have  a volume reduction of 50% by week 8 Date Initiated: 08/20/2021 Target Resolution Date: 10/20/2021 Goal Status: Active Ulcer/skin breakdown will have a volume reduction of 80% by week 12 Date Initiated: 08/20/2021 Target Resolution Date: 11/20/2021 Goal Status: Active Ulcer/skin breakdown will heal within 14 weeks Date Initiated: 08/20/2021 Target Resolution Date: 12/20/2021 Goal Status: Active Interventions: Assess patient/caregiver ability to obtain necessary supplies Assess patient/caregiver ability to perform ulcer/skin care regimen upon admission and as needed Assess ulceration(s) every visit Notes: Electronic Signature(s) Signed: 10/19/2021 1:10:05 PM By: Gretta Cool, BSN, RN, CWS, Kim RN, BSN Signed: 10/19/2021 5:04:14 PM By: [name] Entered By: [name] on 10/19/2021 10:08:57 Bagby, Haileyann M. ([id number]) -------------------------------------------------------------------------------- Pain Assessment Details Patient Name:  [name]. Date of Service: 10/19/2021 9:45 AM Medical Record Number: [id number] Patient Account Number: [id number] Date of Birth/Sex: [birth year] (46 y.o. F) Treating RN: Cornell Barman Primary Care Dashan Chizmar: [name] Other Clinician: [name] Referring Uchenna Rappaport: [name] Treating Macall Mccroskey/Extender: [name] in Treatment: 8 Active Problems Location of Pain Severity and Description of Pain Patient Has Paino No Site Locations Pain Management and Medication Current Pain Management: Electronic Signature(s) Signed: 10/19/2021 1:10:05 PM By: Gretta Cool, BSN, RN, CWS, Kim RN, BSN Signed: 10/19/2021 5:04:14 PM By: [name] Entered By: [name] on 10/19/2021 10:03:51 Yilmaz, Maleka M. ([id number]) -------------------------------------------------------------------------------- Patient/Caregiver Education Details Patient Name: [name]. Date of Service: 10/19/2021 9:45 AM Medical Record Number: [id number] Patient Account Number: [id number] Date of Birth/Gender: [birth year] (46 y.o. F) Treating RN: Cornell Barman Primary Care Physician: [name] Other Clinician: [name] Referring Physician: [name] Treating Physician/Extender: [name] in Treatment: 8 Education Assessment Education Provided To: Patient Education Topics Provided Wound/Skin Impairment: Handouts: Other: continue wound care as directed Methods: Explain/Verbal Responses: State content correctly Electronic Signature(s) Signed: 10/19/2021 5:04:14 PM By: [name] Entered By: [name] on 10/19/2021 10:17:32 Funderburk, Rozena M. ([id number]) -------------------------------------------------------------------------------- Wound Assessment Details Patient Name: [name]. Date of Service: 10/19/2021 9:45 AM Medical Record Number: [id number] Patient Account Number: [id number] Date of Birth/Sex: 1975/08/16 (46 y.o. F) Treating RN: Cornell Barman Primary Care Fynn Vanblarcom: [name] Other Clinician: [name] Referring Aravind Chrismer: [name] Treating Karmon Andis/Extender: [name] in Treatment: 8 Wound Status Wound Number: 2 Primary Abscess Etiology: Wound Location: Posterior Foot Wound Status: Open Wounding Event: Trauma Comorbid Anemia, Asthma, History of pressure wounds, Date Acquired: 06/18/2021 History: Osteomyelitis, Neuropathy Weeks Of Treatment: 8 Clustered Wound: No Photos Wound Measurements Length: (cm) 0.3 Width: (cm) 0.3 Depth: (cm) 0.2 Area: (cm) 0.071 Volume: (cm) 0.014 % Reduction in Area: 90% % Reduction in Volume: 95.1% Epithelialization: None Wound Description Classification: Full Thickness Without Exposed Support Structures Exudate Amount: Medium Exudate Type: Serosanguineous Exudate Color: red, brown Foul Odor After Cleansing: No Slough/Fibrino No Wound Bed Granulation Amount: Large (67-100%) Exposed Structure Granulation Quality: Pink Fascia Exposed: No Necrotic Amount: Small (1-33%) Fat Layer (Subcutaneous Tissue) Exposed: Yes Necrotic Quality: Adherent Slough Tendon Exposed: No Muscle Exposed: No Joint Exposed: No Bone Exposed: No Treatment Notes Wound #2 (Foot) Wound Laterality: Posterior Cleanser Byram Ancillary Kit - 15 Day Supply Discharge Instruction: Use supplies as instructed; Kit contains: (15) Saline Bullets; (15) 3x3 Gauze; 15 pr Gloves Soap and Water Discharge Instruction: Gently cleanse wound with antibacterial soap, rinse and pat dry prior to dressing wounds Flaugher, Junella M. ([id number]) Peri-Wound Care Topical Gentamicin Discharge Instruction: Apply as directed by Yasaman Kolek. Primary Dressing Hydrofera Blue Ready Transfer Foam, 4x5 (in/in) Discharge Instruction: Apply Hydrofera Blue Ready to wound bed  as directed Secondary Dressing (BORDER) Zetuvit Plus SILICONE BORDER Dressing 4x4 (in/in) Discharge Instruction: Please do not put silicone  bordered dressings under wraps. Use non-bordered dressing only. Secured With Compression Wrap Compression Stockings Environmental education officer) Signed: 10/19/2021 1:10:05 PM By: Gretta Cool, BSN, RN, CWS, Kim RN, BSN Signed: 10/19/2021 5:04:14 PM By: [name] Entered By: [name] on 10/19/2021 10:08:00 Salvi, Mayleen M. ([id number]) -------------------------------------------------------------------------------- Vitals Details Patient Name: [name]. Date of Service: 10/19/2021 9:45 AM Medical Record Number: [id number] Patient Account Number: [id number] Date of Birth/Sex: 17-Oct-1975 (46 y.o. F) Treating RN: Cornell Barman Primary Care Quill Grinder: [name] Other Clinician: [name] Referring Quincie Haroon: [name] Treating Jomaira Darr/Extender: [name] in Treatment: 8 Vital Signs Time Taken: 10:01 Temperature (F): 98.4 Height (in): 67 Pulse (bpm): 88 Weight (lbs): 153 Respiratory Rate (breaths/min): 16 Body Mass Index (BMI): 24 Blood Pressure (mmHg): 123/71 Reference Range: 80 - 120 mg / dl Electronic Signature(s) Signed: 10/19/2021 5:04:14 PM By: [name] Entered By: [name] on 10/19/2021 10:03:07

## 2021-10-26 ENCOUNTER — Ambulatory Visit (INDEPENDENT_AMBULATORY_CARE_PROVIDER_SITE_OTHER): Payer: Medicare PPO | Admitting: Psychology

## 2021-10-26 DIAGNOSIS — F331 Major depressive disorder, recurrent, moderate: Secondary | ICD-10-CM
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
## 2021-11-02 ENCOUNTER — Ambulatory Visit: Payer: Medicare PPO | Admitting: Physician Assistant

## 2021-11-09 ENCOUNTER — Ambulatory Visit (INDEPENDENT_AMBULATORY_CARE_PROVIDER_SITE_OTHER): Payer: Medicare PPO | Admitting: Psychology

## 2021-11-09 ENCOUNTER — Encounter: Payer: Medicare PPO | Admitting: Physician Assistant

## 2021-11-09 DIAGNOSIS — M86671 Other chronic osteomyelitis, right ankle and foot: Secondary | ICD-10-CM | POA: Diagnosis not present

## 2021-11-09 DIAGNOSIS — G600 Hereditary motor and sensory neuropathy: Secondary | ICD-10-CM | POA: Diagnosis not present

## 2021-11-09 DIAGNOSIS — L97512 Non-pressure chronic ulcer of other part of right foot with fat layer exposed: Secondary | ICD-10-CM | POA: Diagnosis not present

## 2021-11-09 DIAGNOSIS — L02611 Cutaneous abscess of right foot: Secondary | ICD-10-CM | POA: Diagnosis not present

## 2021-11-09 DIAGNOSIS — L84 Corns and callosities: Secondary | ICD-10-CM | POA: Diagnosis not present

## 2021-11-09 DIAGNOSIS — F331 Major depressive disorder, recurrent, moderate: Secondary | ICD-10-CM

## 2021-11-09 DIAGNOSIS — G8929 Other chronic pain: Secondary | ICD-10-CM | POA: Diagnosis not present

## 2021-11-09 DIAGNOSIS — G7100 Muscular dystrophy, unspecified: Secondary | ICD-10-CM | POA: Diagnosis not present

## 2021-11-09 NOTE — Progress Notes (Signed)
FLOREINE, KINGDON ([id number]) [id number] Page 1 of 9 Visit Report for 11/09/2021 Chief Complaint Document Details Patient Name: Date of Service: [name]. 11/09/2021 9:00 A M Medical Record Number: [id number] Patient Account Number: [id number] Date of Birth/Sex: Treating RN: [birth year] (46 y.o. [name] Primary Care Provider: [name] Other Clinician: [name] Referring Provider: Treating Provider/Extender: [name] in Treatment: 11 Information Obtained from: Patient Chief Complaint Right foot ulcer Electronic Signature(s) Signed: 11/09/2021 9:15:43 AM By: [name] Entered By: Worthy Keeler on 11/09/2021 09:15:42 -------------------------------------------------------------------------------- Debridement Details Patient Name: Date of Service: [name], [name]. 11/09/2021 9:00 A M Medical Record Number: [id number] Patient Account Number: [id number] Date of Birth/Sex: Treating RN: Mar 25, 1975 (46 y.o. [name] Primary Care Provider: [name] Other Clinician: [name] Referring Provider: Treating Provider/Extender: [name] in Treatment: 11 Debridement Performed for Assessment: Wound #2 Posterior Foot Performed By: Physician [name], [name] Debridement Type: Debridement Level of Consciousness (Pre-procedure): Awake and Alert Pre-procedure Verification/Time Out Yes - 09:20 Taken: Start Time: 09:20 T Area Debrided (L x W): otal 0.7 (cm) x 0.7 (cm) = 0.49 (cm) Tissue and other material debrided: Viable, Non-Viable, Callus, Slough, Subcutaneous, Slough Level: Skin/Subcutaneous Tissue Debridement Description: Excisional Instrument: Curette Bleeding: Minimum Hemostasis Achieved: Pressure End Time: 09:24 Response to Treatment: Procedure was tolerated well Level of Consciousness (Post- Awake and Alert procedure): ROLLANDE, THURSBY  ([id number]) [id number] Page 2 of 9 Post Debridement Measurements of Total Wound Length: (cm) 0.3 Width: (cm) 0.5 Depth: (cm) 0.3 Volume: (cm) 0.035 Character of Wound/Ulcer Post Debridement: Stable Post Procedure Diagnosis Same as Pre-procedure Electronic Signature(s) Signed: 11/09/2021 5:38:46 PM By: [name] Signed: 11/09/2021 6:00:27 PM By: Gretta Cool, BSN, RN, CWS, Kim RN, BSN Signed: 11/10/2021 6:28:37 PM By: [name] Entered By: [name] on 11/09/2021 09:24:21 -------------------------------------------------------------------------------- HPI Details Patient Name: Date of Service: [name], [name]. 11/09/2021 9:00 A M Medical Record Number: [id number] Patient Account Number: [id number] Date of Birth/Sex: Treating RN: [birth year] (46 y.o. [name] Primary Care Provider: [name] Other Clinician: [name] Referring Provider: Treating Provider/Extender: [name] in Treatment: 11 History of Present Illness Location: right first metatarsal head plantar aspect Quality: Patient reports experiencing a dull pain to affected area(s). Severity: Patient states wound are getting worse. Duration: Patient has had the wound for > 2 months prior to seeking treatment at the wound center Timing: Pain in wound is Intermittent (comes and goes Context: The wound appeared gradually over time Modifying Factors: Other treatment(s) tried include:pain clinic for her narcotics ssociated Signs and Symptoms: Patient reports having difficulty standing for long periods. A HPI Description: 46 year old patient sent to [name] by her PCP 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 Her notes were reviewed and there was extensive history of opioid  treatment and management by the [facility] pain clinic from where she has been terminated. X-ray of the right foot foot was done on 03/13/2015 and was suggestive of active infection and possible osteomyelitis. her past surgical history includes amputation of the left big toe in 2011 and amputation of the right big toe in 2015. She was sitting the wound clinic at [facility] and we will try and obtain these notes. She is a smoker and  smokes about 15 cigarettes a day. 06/12/2015 - x-ray of the right foot -- IMPRESSION:1. Soft tissue swelling of the plantar surface of the foot. No underlying acute abnormality. 2. Amputation deformity again of the right great toe. Old healed bony deformity noted the right second metatarsal. No acute bony abnormality identified. We have not received any reports from the [facility] hospital yet. She continues to smoke at least 10 cigarettes a day 06/19/2015 -- she has increasing pain in the right foot and also has had some low-grade fever. Her vascular test is scheduled for tomorrow and her MRIs not till next week. Readmission: 08-20-2021 upon evaluation today patient presents for initial inspection here in our clinic although she has been seen that was back in 2017. At that time she was having a issue with her right foot. With that being said upon inspection today she is actually having issues with the right foot as well on the medial aspect where she has some breakdown due to what I am assuming is pressure to the foot location. 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 I did review her culture report which showed evidence of bacteria including hemolytic Streptococcus group A as well as Staphylococcus aureus. The patient is currently on doxycycline and tells me that this is getting much better. T opical  gentamicin is also being utilized. Also did review her arterial studies which show that she has on the right and ABI of 1.15 which is triphasic and appears to be excellent there is no significant peripheral artery disease noted the left is also. With ABI of 1.11 and a good report in that regard as well. Lastly I did also review her MRI of the right forefoot which was performed on 07-06-2021. This shows that she does have mild subcortical marrow edema in the stump of the first metatarsal concerning for osteomyelitis. There is also a malunited fracture of the second metatarsal. The patient has bone marrow edema in the base of the second third fourth and fifth metatarsals concerning for stress reaction obviously this infection is going to be an issue for [name] here as well she needs to be treated appropriately and I do believe that the doxycycline is a good option right now although I may need to #1 extend this and #2 plan for even altering or adding to the treatment regimen depending on how things progress. Patient voiced understanding.Marland Kitchen JANISSE, GHAN ([id number]) [id number] Page 3 of 9 09-01-2021 upon evaluation today patient appears to be doing excellent in regard to her wound. She is actually showing signs of excellent improvement which is great news. Fortunately I do not see any signs of active infection locally or systemically at this point which is great news and overall I am extremely pleased with where things stand today. I do not see any evidence of active infection locally or systemically. 09-07-2021 upon evaluation today patient appears to be doing well with regard to her foot ulcer. This is actually showing signs of improvement which is great news. Fortunately there does not appear to be any signs of active infection locally or systemically at this time which is as well. 09-22-2021 upon evaluation today patient appears to be doing well currently in regard to her wound.  This is measuring smaller and looking better were slowly making progress here. Fortunately there does not appear  to be any evidence of active infection locally or systemically at this time. Upon inspection patient's wound bed showed evidence of good granulation and epithelization at this point. 10-05-2021 upon evaluation today patient's wound shows a lot of callus buildup but the surface of the wound is actually doing quite well. She is going require some sharp debridement to clearway some of the necrotic debris. 10-19-2021 upon evaluation today patient appears to be doing better in regard to her wound which is actually measuring little bit smaller today. Fortunately there does not appear to be any signs of significant infection she does have quite a bit of callus we will get have to work on that today. Otherwise I think we are headed in the appropriate direction. 11-09-2021 upon evaluation today patient appears to be doing well in regard to her wound from the standpoint of infection and actually appears to be doing better she actually just completed the doxycycline. With that being said I am hopeful that this took care of the osteomyelitis although that something that we will definitely keep a close eye on. With that being said I do believe that she is still having tremendous amount of callus buildup which I believe in turn is a result of her having issues here with Friction and callus buildup. With that being said I do believe that the patient would benefit here from a total contact cast I discussed that with her today. I do want to get more aggressive to get this closed as I think that if we do not then she is more likely to have a long standing issue here going forward. Electronic Signature(s) Signed: 11/09/2021 9:39:47 AM By: [name] Entered By: Worthy Keeler on 11/09/2021 09:39:46 -------------------------------------------------------------------------------- Physical Exam  Details Patient Name: Date of Service: [name]. 11/09/2021 9:00 A M Medical Record Number: [id number] Patient Account Number: [id number] Date of Birth/Sex: Treating RN: 1975/07/16 (46 y.o. [name] Primary Care Provider: [name] Other Clinician: [name] Referring Provider: Treating Provider/Extender: [name] in Treatment: 61 Constitutional Well-nourished and well-hydrated in no acute distress. Respiratory normal breathing without difficulty. Psychiatric this patient is able to make decisions and demonstrates good insight into disease process. Alert and Oriented x 3. pleasant and cooperative. Notes Upon inspection patient's wound bed actually showed signs of good granulation and epithelization at this point. Fortunately I do not see any evidence of active infection locally or systemically which is great news I do believe that the doxycycline she was on for 2 months has been beneficial. With that being said based on what I am seeing I do believe friction is probably our primary indicator of issue here. They will keep a close eye on things and if she is not seeing good improvement or if the infection returns then we may proceed with hyperbaric oxygen therapy as well. A lot depends on how things go going forward. Electronic Signature(s) Signed: 11/09/2021 9:40:23 AM By: [name] Entered By: Worthy Keeler on 11/09/2021 09:40:23 Nazar, Daisia Jerilynn Mages ([id number]) [id number] Page 4 of 9 -------------------------------------------------------------------------------- Physician Orders Details Patient Name: Date of Service: [name]. 11/09/2021 9:00 A M Medical Record Number: [id number] Patient Account Number: [id number] Date of Birth/Sex: Treating RN: 1975/03/21 (46 y.o. 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 Bathing/ Shower/ Hygiene May shower with wound dressing protected with water repellent cover or cast protector. No tub bath. Anesthetic (Use 'Patient Medications' Section for Anesthetic Order Entry) Lidocaine applied to wound bed Edema Control - Lymphedema / Segmental Compressive Device / Other Elevate, Exercise Daily and A void Standing for Long Periods of Time. Elevate legs to the level of the heart and pump ankles as often as possible Elevate leg(s) parallel to the floor when sitting. Off-Loading Total Contact Cast to Right Lower Extremity - possible tcc next visit Medications-Please add to medication list. Wound #2 Posterior Foot Take one 534m Tylenol (A cetaminophen) and one 2032mMotrin (Ibuprofen) every 6 hours for pain. Do not take ibuprofen if you are on blood thinners or have stomach ulcers. - take every 4 hours while awake Wound Treatment Wound #2 - Foot Wound Laterality: Posterior Cleanser: Byram Ancillary Kit - 15 Day Supply (Generic) 3 x Per Day/30 Days Discharge Instructions: Use supplies as instructed; Kit contains: (15) Saline Bullets; (15) 3x3 Gauze; 15 pr Gloves Cleanser: Soap and Water 3 x Per Day/30 Days Discharge Instructions: Gently cleanse wound with antibacterial soap, rinse and pat dry prior to dressing wounds Topical: Gentamicin 3 x Per Day/30 Days Discharge Instructions: Apply as directed by provider. Prim Dressing: Prisma 4.34 (in) 3 x Per Day/30 Days ary Discharge Instructions: Moisten w/normal saline  or sterile water; Cover wound as directed. Do not remove from wound bed. Secondary Dressing: (BORDER) Zetuvit Plus SILICONE BORDER Dressing 4x4 (in/in) (Dispense As Written) 3 x Per Day/30 Days Discharge Instructions: Please do not put silicone bordered dressings under wraps. Use non-bordered dressing only. Patient Medications NEKEELIE, [id number] Page 5 of 9 llergies: penicillin, Bactrim, Sulfa (Sulfonamide Antibiotics), tramadol HCl, clindamycin, bee venom protein (honey bee) A Notifications Medication Indication Start End 11/09/2021 gentamicin DOSE topical 0.1 % cream - cream topical applied in a thin film to the wound bed with each dressing change 3 times per week. Electronic Signature(s) Signed: 11/09/2021 9:43:06 AM By: StWorthy KeelerA-C Entered By: StWorthy Keelern 11/09/2021 09:43:06 -------------------------------------------------------------------------------- Problem List Details Patient Name: Date of Service: NETheodoro ClockMAHarrison Mons10/23/2023 9:00 A M Medical Record Number: [id number] Account Number: [id number] of Birth/Sex: Treating RN: [birth year].o. 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 F)Marlowe Shoresrimary Care Provider: SoTommi Rumpsther Clinician: VeMassie Kluvereferring Provider:  Treating Provider/Extender: [name] in Treatment: 11 Subjective Chief Complaint Information obtained from Patient Right foot ulcer History of Present Illness (HPI) The following HPI elements were documented for the patient's wound: Location: right first metatarsal head plantar aspect Quality: Patient reports experiencing a dull pain to affected area(s). Severity: Patient states wound are getting worse. Duration: Patient has had the wound for > 2 months prior to seeking treatment at the wound center Timing: Pain in wound is Intermittent (comes and goes Context: The wound appeared gradually over time Modifying Factors: Other treatment(s) tried include:pain clinic for her narcotics Associated Signs and Symptoms: Patient reports having difficulty standing for long periods. 46 year old patient sent to [name] by her PCP 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 Her notes were reviewed and there was extensive history of opioid treatment and management by the [facility] pain clinic from where she has been terminated. X-ray of the right foot foot was done on 03/13/2015 and was suggestive of active infection and possible osteomyelitis. her past surgical history includes amputation of the left big toe in 2011 and amputation of  the right big toe in 2015. She was sitting the wound clinic at [facility] and we will try and obtain these notes. She is a smoker and smokes about 15 cigarettes a day. 06/12/2015 - x-ray of the right foot -- IMPRESSION:1. Soft tissue swelling of the plantar surface of the foot. No underlying acute abnormality. 2. Amputation deformity again of the right great toe. Old healed bony deformity noted the right second metatarsal. No acute bony abnormality identified. We have not received any reports from the [facility] hospital yet. She continues to smoke at least 10 cigarettes a day 06/19/2015 -- she has increasing pain in the right foot and also has had some low-grade fever. Her vascular test is scheduled for tomorrow and her MRIs not till next week. Readmission: 08-20-2021 upon evaluation today patient presents for initial inspection here in our clinic although she has been seen that was back in 2017. At that time she was having a issue with her right foot. With that being said upon inspection today she is actually having issues with the right foot as well on the medial aspect where she has some breakdown due to what I am assuming is pressure to the foot location. 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 I did review her culture report which showed evidence of bacteria including hemolytic Streptococcus group A as well as Staphylococcus aureus. The patient is currently on doxycycline and tells me that this is getting much better. T opical gentamicin is also being utilized. Also did review her arterial studies which show that she has on the right and ABI of 1.15 which is triphasic and appears to be excellent there is no significant peripheral artery disease noted the left is also. With ABI of 1.11 and a good report in that regard as  well. Lastly I did also review her MRI of the right forefoot which was performed on 07-06-2021. This shows that she does have mild subcortical marrow edema in the stump of the first metatarsal concerning for osteomyelitis. There is also a malunited fracture of the  second metatarsal. The patient has bone marrow edema in the base of the second third fourth and fifth metatarsals concerning for stress reaction obviously this infection is going to be an issue for [name] here as well she needs to be treated appropriately and I do believe that the doxycycline is a good option right now although I may need to #1 extend this and #2 plan for even altering or adding to the treatment regimen depending on how things progress. Patient voiced understanding.Marland Kitchen 09-01-2021 upon evaluation today patient appears to be doing excellent in regard to her wound. She is actually showing signs of excellent improvement which is great news. Fortunately I do not see any signs of active infection locally or systemically at this point which is great news and overall I am extremely pleased with where things stand today. I do not see any evidence of active infection locally or systemically. 09-07-2021 upon evaluation today patient appears to be doing well with regard to her foot ulcer. This is actually showing signs of improvement which is great news. Fortunately there does not appear to be any signs of active infection locally or systemically at this time which is as well. 09-22-2021 upon evaluation today patient appears to be doing well currently in regard to her wound. This is measuring smaller and looking better were slowly making progress here. Fortunately there does not appear to be any evidence of active infection locally or systemically at this time. Upon inspection patient's wound bed showed evidence of good granulation and epithelization at this point. 10-05-2021 upon evaluation today patient's wound shows a lot of callus buildup but  the surface of the wound is actually doing quite well. She is going require some sharp debridement to clearway some of the necrotic debris. 10-19-2021 upon evaluation today patient appears to be doing better in regard to her wound which is actually measuring little bit smaller today. Fortunately there does not appear to be any signs of significant infection she does have quite a bit of callus we will get have to work on that today. Otherwise I think we are CORLETTE, CIANO ([id number]) [id number] Page 7 of 9 headed in the appropriate direction. 11-09-2021 upon evaluation today patient appears to be doing well in regard to her wound from the standpoint of infection and actually appears to be doing better she actually just completed the doxycycline. With that being said I am hopeful that this took care of the osteomyelitis although that something that we will definitely keep a close eye on. With that being said I do believe that she is still having tremendous amount of callus buildup which I believe in turn is a result of her having issues here with Friction and callus buildup. With that being said I do believe that the patient would benefit here from a total contact cast I discussed that with her today. I do want to get more aggressive to get this closed as I think that if we do not then she is more likely to have a long standing issue here going forward. Objective Constitutional Well-nourished and well-hydrated in no acute distress. Vitals Time Taken: 9:06 AM, Height: 67 in, Weight: 153 lbs, BMI: 24, Temperature: 98.6 F, Pulse: 81 bpm, Respiratory Rate: 16 breaths/min, Blood Pressure: 131/85 mmHg. Respiratory normal breathing without difficulty. Psychiatric this patient is able to make decisions and demonstrates good insight into disease process. Alert and Oriented x 3. pleasant and cooperative. General Notes: Upon inspection patient's wound bed actually showed  signs of  good granulation and epithelization at this point. Fortunately I do not see any evidence of active infection locally or systemically which is great news I do believe that the doxycycline she was on for 2 months has been beneficial. With that being said based on what I am seeing I do believe friction is probably our primary indicator of issue here. They will keep a close eye on things and if she is not seeing good improvement or if the infection returns then we may proceed with hyperbaric oxygen therapy as well. A lot depends on how things go going forward. Integumentary (Hair, Skin) Wound #2 status is Open. Original cause of wound was Trauma. The date acquired was: 06/18/2021. The wound has been in treatment 11 weeks. The wound is located on the Posterior Foot. The wound measures 0.3cm length x 0.3cm width x 0.2cm depth; 0.071cm^2 area and 0.014cm^3 volume. There is Fat Layer (Subcutaneous Tissue) exposed. There is a medium amount of serosanguineous drainage noted. There is large (67-100%) pink granulation within the wound bed. There is a small (1-33%) amount of necrotic tissue within the wound bed including Adherent Slough. 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 There was a Excisional Skin/Subcutaneous Tissue Debridement with a total area of 0.49 sq cm performed by [name], [name]. With the following instrument(s): Curette to remove Viable and Non-Viable tissue/material. Material removed includes Callus, Subcutaneous Tissue, and Slough. A time out was conducted at 09:20, prior to the start of the procedure. A Minimum amount of bleeding was controlled with Pressure.  The procedure was tolerated well. Post Debridement Measurements: 0.3cm length x 0.5cm width x 0.3cm depth; 0.035cm^3 volume. Character of Wound/Ulcer Post Debridement is stable. Post procedure Diagnosis Wound #2: Same as Pre-Procedure Plan Follow-up Appointments: Return Appointment in 1 week. Bathing/ Shower/ Hygiene: May shower with wound dressing protected with water repellent cover or cast protector. No tub bath. KEIONNA, KINNAIRD ([id number]) [id number] Page 8 of 9 Anesthetic (Use 'Patient Medications' Section for Anesthetic Order Entry): Lidocaine applied to wound bed Edema Control - Lymphedema / Segmental Compressive Device / Other: Elevate, Exercise Daily and Avoid Standing for Long Periods of Time. Elevate legs to the level of the heart and pump ankles as often as possible Elevate leg(s) parallel to the floor when sitting. Off-Loading: T Contact Cast to Right Lower Extremity - possible tcc next visit otal Medications-Please add to medication list.: Wound #2 Posterior Foot: T one 534m Tylenol (Acetaminophen) and one 2013mMotrin (Ibuprofen) every 6 hours for pain. Do not take ibuprofen if you are on blood thinners or ake have stomach ulcers. - take every 4 hours while awake The following medication(s) was prescribed: gentamicin topical 0.1 % cream cream topical applied in a thin film to the wound bed with each dressing change 3 times per week. starting 11/09/2021 WOUND #2: - Foot Wound Laterality: Posterior Cleanser: Byram Ancillary Kit - 15 Day Supply (Generic) 3 x Per Day/30 Days Discharge Instructions: Use supplies as instructed; Kit contains: (15) Saline Bullets; (15) 3x3 Gauze; 15 pr Gloves Cleanser: Soap and Water 3 x Per Day/30 Days Discharge Instructions: Gently cleanse wound with antibacterial soap, rinse and pat dry prior to dressing wounds Topical: Gentamicin 3 x Per Day/30 Days Discharge Instructions:  Apply as directed by  provider. Prim Dressing: Prisma 4.34 (in) 3 x Per Day/30 Days ary Discharge Instructions: Moisten w/normal saline or sterile water; Cover wound as directed. Do not remove from wound bed. Secondary Dressing: (BORDER) Zetuvit Plus SILICONE BORDER Dressing 4x4 (in/in) (Dispense As Written) 3 x Per Day/30 Days Discharge Instructions: Please do not put silicone bordered dressings under wraps. Use non-bordered dressing only. 1. I am going to recommend that we have the patient continue to monitor for any signs of worsening infection especially now that she is off of the antibiotics orally I am going to continue her with the gentamicin however as that seems to have been beneficial for her and she is in agreement with that plan. 2. I am also can recommend that we have the patient continue with the dressings 3 times a week although I am going to switch her to a silver collagen to see if this can be beneficial we have been using Hydrofera Blue and to be honest I think the collagen could be a better way to go. 3. I am also going to suggest the patient try to stay off of this is much as possible until we are able to get in a total contact cast the plan is for next week. We will see patient back for reevaluation in 1 week here in the clinic. If anything worsens or changes patient will contact our office for additional recommendations. Electronic Signature(s) Signed: 11/09/2021 9:43:17 AM By: [name] Entered By: Worthy Keeler on 11/09/2021 09:43:17 -------------------------------------------------------------------------------- SuperBill Details Patient Name: Date of Service: [name], [name] 11/09/2021 Medical Record Number: [id number] Patient Account Number: [id number] Date of Birth/Sex: Treating RN: August 28, 1975 (46 y.o. [name] Primary Care Provider: [name] Other Clinician: [name] Referring Provider: Treating Provider/Extender: [name] in Treatment: 11 Diagnosis Coding ICD-10 Codes Code Description [phone] Other chronic osteomyelitis, right ankle and foot L97.512 Non-pressure chronic ulcer of other part of right foot with fat layer exposed G71.00 Muscular dystrophy, unspecified G60.0 Hereditary motor and sensory neuropathy L84 Corns and callosities Facility Procedures : SHAYDEN, GINGRICH Code: [id number] [place] ([id number]) ICD L9 Description: 84720 - DEB SUBQ TISSUE 20 SQ CM/< [id number] -10 Diagnosis Description 7.512 Non-pressure chronic ulcer of other part of right foot with fat layer exposed Modifier: [id number] Page Quantity: 1 9 of 9 Physician Procedures : CPT4 Code Description Modifier [id number] - WC PHYS SUBQ TISS 20 SQ CM ICD-10 Diagnosis Description L97.512 Non-pressure chronic ulcer of other part of right foot with fat layer exposed Quantity: 1 Electronic Signature(s) Signed: 11/09/2021 9:43:48 AM By: [name] Entered By: Worthy Keeler on 11/09/2021 09:43:48

## 2021-11-09 NOTE — Progress Notes (Signed)
Deardra Jake Goodson is a 46 y.o. female patient .  Diagnosis 296.30 (Major depressive affective disorder, recurrent episode, unspecified) [n/a]  Symptoms Depressed or irritable mood. (Status: maintained) -- No Description Entered  Diminished interest in or enjoyment of activities. (Status: maintained) -- No Description Entered  Feelings of hopelessness, worthlessness, or inappropriate guilt. (Status: maintained) -- No Description Entered  Lack of energy. (Status: maintained) -- No Description Entered  Unresolved grief issues. (Status: maintained) -- No Description Entered  Medication Status compliance  Safety none  If Suicidal or Homicidal State Action Taken: unspecified  Current Risk: low Medications Hydroxyzine (Dosage: unknown)  Remiron (Dosage: unknown)  Sertraline (Dosage: unknown)  Objectives Related Problem: Appropriately grieve the loss in order to normalize mood and to return to previously adaptive level of functioning. Description: Increasingly verbalize hopeful and positive statements regarding self, others, and the future. Target Date: 2022-01-13 Frequency: Daily Modality: individual Progress: 80%  Related Problem: Appropriately grieve the loss in order to normalize mood and to return to previously adaptive level of functioning. Description: Learn and implement conflict resolution skills to resolve interpersonal problems. Target Date: 2022-01-13 Frequency: Daily Modality: individual Progress: 80%  Related Problem: Appropriately grieve the loss in order to normalize mood and to return to previously adaptive level of functioning. Description: Learn and implement problem-solving and decision-making skills. Target Date: 2022-01-13 Frequency: Daily Modality: individual Progress: 80%  Related Problem: Appropriately grieve the loss in order to normalize mood and to return to previously adaptive level of functioning. Description: Describe current and past experiences  with depression including their impact on functioning and attempts to resolve it. Target Date: 2021-07-14 Frequency: Daily Modality: individual Progress: 100%-completed  Related Problem: Complete the process of letting go of the lost significant other. Description: Report decreased time spent each day focusing on the loss. Target Date: 2022-01-13 Frequency: Daily Modality: individual Progress: 80%  Related Problem: Complete the process of letting go of the lost significant other. Description: Decrease unrealistic thoughts, statements, and feelings of being responsible for the loss. Target Date: 2021-07-14 Frequency: Daily Modality: individual Progress: 100%-completed  Related Problem: Complete the process of letting go of the lost significant other. Description: Verbalize and resolve feelings of anger or guilt focused on self or deceased loved one that interfere with the grieving process. Target Date: 2021-07-14 Frequency: Daily Modality: individual Progress: 100%-completed  Related Problem: Complete the process of letting go of the lost significant other. Description: Begin verbalizing feelings associated with the loss. Target Date: 2022-01-13 Frequency: Daily Modality: individual Progress: 80%  Related Problem: Complete the process of letting go of the lost significant other. Description: Identify what stages of grief have been experienced in the continuum of the grieving process. Target Date: 2021-07-14 Frequency: Daily Modality: individual Progress: 100%-completed  Related Problem: Complete the process of letting go of the lost significant other. Description: Participate in a therapy that addresses issues beyond grief that have arisen as a result of the loss. Target Date: 2022-01-13 Frequency: Daily Modality: individual Progress: 80%  Related Problem: Complete the process of letting go of the lost significant other. Description: Tell in detail the story of the  current loss that is triggering symptoms. Target Date: 2021-07-14 Frequency: Daily Modality: individual Progress: 100%-completed  Client Response full compliance  Service Location Location, [address] [place], [place] [address]  Service Code cpt [phone]  Validate/empathize  Related past to present  Behavioral activation plan  Facilitate problem solving  Identify automatic thoughts  Rationally challenge thoughts or beliefs/cognitive restructuring  Identify/label emotions  Emotion regulation skills  Psychiatrist  Self care activities  Self-monitoring  Session Notes: Patient requests a video Webex session.. She is at home and I am at my home office.  Dx: Major Depression  Meds: Lexapro ('20mg'$ ), Busperone (7.5)  Goals: States that she is seeking counseling to learn to manage the grief reaction to losing her husband. She reports feeling tired all the time and that she has no energy or motivation. Needs to develop coping strategies. Therapy to focus on reducing despair, sadness, hopelessness and helplessness. 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 Will explore those early traumas/losses and current triggers. Goal dates: 04-2021 Revised Goals:12-23 Patient states she is pleased that her foot is healing. It was very bad at one point. She is also pleased that her daughter got offered a very good job with much better pay. "Those are the positives". She states that her brother installed a Psychologist, sport and exercise in her parents home. [name] feels that her brother did this to listen in on their conversations. She thinks it is because he is worried about the will. He told him he put in camera to see if his mother falls. [name] says that she does not him and is telling her parents lies. [name] talked about the many ways that she felt and feels let down by her mother. She is not sure why she fears upsetting her parents. Claims she "still loves"  her mother, but is very hurt by her mother's lack of support and love. [name] is far more assertive and is now able to stand up to her parents in ways she did not in the past.                                 [name], PhD Time: 7:40a-8:25a 45 minutes

## 2021-11-10 NOTE — Progress Notes (Signed)
PURA, PICINICH ([id number]) [id number] Page 1 of 8 Visit Report for 11/09/2021 Arrival Information Details Patient Name: Date of Service: [name], [name]. 11/09/2021 9:00 A [name]: [id number] Patient Account Number: [id number] Date of Birth/Sex: Treating RN: 02-Apr-1975 (46 y.o. Kelly Forward, [name] Primary Care [name]: [name] Other Clinician: Massie [name] Referring [name]: Treating [name]/Extender: [name] in Treatment: 11 Visit Information History Since Last Visit All ordered tests and consults were completed: No Patient Arrived: Ambulatory Added [name] deleted any medications: No Arrival Time: 09:06 Any new allergies [name] adverse reactions: No Transfer Assistance: None Signs [name] symptoms of abuse/neglect since last visito No Patient Requires Transmission-Based Precautions: No Hospitalized since last visit: No Patient Has Alerts: Yes Pain Present Now: No Patient Alerts: ABI R 1.15 02/04/21 ABI L 1.11 02/04/21 Electronic Signature(s) Signed: 11/09/2021 5:38:46 PM By: [name] Entered By: [name] on 11/09/2021 09:06:32 -------------------------------------------------------------------------------- Clinic Level of Care Assessment Details Patient Name: Date of Service: [name]. 11/09/2021 9:00 A [name]: [id number] Patient Account Number: [id number] Date of Birth/Sex: Treating RN: 24-Jan-1975 (46 y.o. [name] Primary Care [name]: [name] Other Clinician: Massie [name] Referring [name]: Treating [name]/Extender: [name] in Treatment: 11 Clinic Level of Care Assessment Items TOOL 1 Quantity Score []  - 0 Use when EandM and Procedure is performed on INITIAL visit ASSESSMENTS - Nursing Assessment / Reassessment []  - 0 General Physical Exam (combine w/ comprehensive assessment (listed just below) when performed on  new pt. evals) []  - 0 Comprehensive Assessment (HX, ROS, Risk Assessments, Wounds Hx, etc.) ASSESSMENTS - Wound and Skin Assessment / Reassessment []  - 0 Dermatologic / Skin Assessment (not related to wound area) ASSESSMENTS - Ostomy and/[name] Continence Assessment and Care []  - 0 Incontinence Assessment and Management [name], [name] ([id number]) [id number] Page 2 of 8 []  - 0 Ostomy Care Assessment and Management (repouching, etc.) PROCESS - Coordination of Care []  - 0 Simple Patient / Family Education for ongoing care []  - 0 Complex (extensive) Patient / Family Education for ongoing care []  - 0 Staff obtains Programmer, systems, Records, T Results / Process Orders est []  - 0 Staff telephones HHA, Nursing Homes / Clarify orders / etc []  - 0 Routine Transfer to another Facility (non-emergent condition) []  - 0 Routine Hospital Admission (non-emergent condition) []  - 0 New Admissions / Biomedical engineer / Ordering NPWT Apligraf, etc. , []  - 0 Emergency Hospital Admission (emergent condition) PROCESS - Special Needs []  - 0 Pediatric / Minor Patient Management []  - 0 Isolation Patient Management []  - 0 Hearing / Language / Visual special needs []  - 0 Assessment of Community assistance (transportation, D/C planning, etc.) []  - 0 Additional assistance / Altered mentation []  - 0 Support Surface(s) Assessment (bed, cushion, seat, etc.) INTERVENTIONS - Miscellaneous []  - 0 External ear exam []  - 0 Patient Transfer (multiple staff / Civil Service fast streamer / Similar devices) []  - 0 Simple Staple / Suture removal (25 [name] less) []  - 0 Complex Staple / Suture removal (26 [name] more) []  - 0 Hypo/Hyperglycemic Management (do not check if billed separately) []  - 0 Ankle / Brachial Index (ABI) - do not check if billed separately Has the patient been seen at the hospital within the last three years: Yes Total Score: 0 Level Of Care: ____ Electronic Signature(s) Signed:  11/09/2021 5:38:46 PM By: [name] Entered By: [name] on 11/09/2021 09:30:09 -------------------------------------------------------------------------------- Encounter Discharge Information Details Patient  Name: Date of Service: [name]. 11/09/2021 9:00 A [name]: [id number] Patient Account Number: [id number] Date of Birth/Sex: Treating RN: [birth year] (46 y.o. [name] Primary Care [name]: [name] Other Clinician: Massie [name] Referring Chari Parmenter: Treating [name]/Extender: [name] in Treatment: 11 Encounter Discharge Information Items Post Procedure Vitals Discharge Condition: Stable Temperature (F): 98.6 Ambulatory Status: Ambulatory Pulse (bpm): 81 Discharge Destination: Home Respiratory Rate (breaths/min): 16 [name], [name] [id number] Page 3 of 8 Transportation: Private Auto Blood Pressure (mmHg): 131/85 Accompanied By: self Schedule Follow-up Appointment: Yes Clinical Summary of Care: Electronic Signature(s) Signed: 11/09/2021 5:38:46 PM By: [name] Entered By: [name] on 11/09/2021 09:38:34 -------------------------------------------------------------------------------- Lower Extremity Assessment Details Patient Name: Date of Service: [name], Kelly Mons. 11/09/2021 9:00 A [name]: [id number] Patient Account Number: [id number] Date of Birth/Sex: Treating RN: Mar 02, 1975 (46 y.o. [name] Primary Care Deneise Getty: [name] Other Clinician: Massie [name] Referring [name]: Treating [name]/Extender: [name] in Treatment: 11 Edema Assessment Assessed: [Left: No] [name]: No] [Left: Edema] [Right: :] Calf Left: Right: Point of Measurement: 30 cm From Medial Instep Ankle Left: Right: Point of Measurement: 10 cm From Medial Instep Electronic Signature(s) Signed: 11/09/2021  5:38:46 PM By: [name] Signed: 11/09/2021 6:00:27 PM By: [name], BSN, RN, CWS, Kim RN, BSN Entered By: [name] on 11/09/2021 09:13:25 -------------------------------------------------------------------------------- Multi Wound Chart Details Patient Name: Date of Service: [name], Kelly Mons. 11/09/2021 9:00 A [name]: [id number] Patient Account Number: [id number] Date of Birth/Sex: Treating RN: 13-Aug-1975 (46 y.o. [name] Primary Care Desma Wilkowski: [name] Other Clinician: Massie [name] Referring Damilola Flamm: Treating Ashyla Luth/Extender: [name] in Treatment: [address] [place] [name] ([id number]) [id number] Page 4 of 8 Vital Signs Height(in): 67 Pulse(bpm): 81 Weight(lbs): 153 Blood Pressure(mmHg): 131/85 Body Mass Index(BMI): 24 Temperature(F): 98.6 Respiratory Rate(breaths/min): 16 [2:Photos:] [N/A:N/A] Posterior Foot N/A N/A Wound Location: Trauma N/A N/A Wounding Event: Abscess N/A N/A Primary Etiology: Anemia, Asthma, History of pressure N/A N/A Comorbid History: wounds, Osteomyelitis, Neuropathy 06/18/2021 N/A N/A Date Acquired: 11 N/A N/A Weeks of Treatment: Open N/A N/A Wound Status: No N/A N/A Wound Recurrence: 0.3x0.3x0.2 N/A N/A Measurements L x W x D (cm) 0.071 N/A N/A A (cm) : rea 0.014 N/A N/A Volume (cm) : 90.00% N/A N/A % Reduction in Area: 95.10% N/A N/A % Reduction in Volume: Full Thickness Without Exposed N/A N/A Classification: Support Structures Medium N/A N/A Exudate Amount: Serosanguineous N/A N/A Exudate Type: red, brown N/A N/A Exudate Color: Large (67-100%) N/A N/A Granulation Amount: Pink N/A N/A Granulation Quality: Small (1-33%) N/A N/A Necrotic Amount: Fat Layer (Subcutaneous Tissue): Yes N/A N/A Exposed Structures: Fascia: No Tendon: No Muscle: No Joint: No Bone: No None N/A N/A Epithelialization: Treatment Notes Electronic  Signature(s) Signed: 11/09/2021 5:38:46 PM By: [name] Entered By: [name] on 11/09/2021 09:13:35 -------------------------------------------------------------------------------- Multi-Disciplinary Care Plan Details Patient Name: Date of Service: [name], Kelly Mons. 11/09/2021 9:00 A [name]: [id number] Patient Account Number: [id number] Date of Birth/Sex: Treating RN: 03-Mar-1975 (46 y.o. [name] Primary Care Paislynn Hegstrom: [name] Other Clinician: Massie [name] Referring Sina Sumpter: Treating Kaushik Maul/Extender: [name] in Treatment: [address], [name] ([id number]) [id number] Page 5 of 8 Active Inactive Wound/Skin Impairment Nursing Diagnoses: Knowledge deficit related to ulceration/compromised skin integrity Goals: Patient/caregiver will verbalize understanding of skin care regimen Date Initiated: 08/20/2021 Target Resolution Date: 09/20/2021  Goal Status: Active Ulcer/skin breakdown will have a volume reduction of 30% by week 4 Date Initiated: 08/20/2021 Target Resolution Date: 09/20/2021 Goal Status: Active Ulcer/skin breakdown will have a volume reduction of 50% by week 8 Date Initiated: 08/20/2021 Target Resolution Date: 10/20/2021 Goal Status: Active Ulcer/skin breakdown will have a volume reduction of 80% by week 12 Date Initiated: 08/20/2021 Target Resolution Date: 11/20/2021 Goal Status: Active Ulcer/skin breakdown will heal within 14 weeks Date Initiated: 08/20/2021 Target Resolution Date: 12/20/2021 Goal Status: Active Interventions: Assess patient/caregiver ability to obtain necessary supplies Assess patient/caregiver ability to perform ulcer/skin care regimen upon admission and as needed Assess ulceration(s) every visit Notes: Electronic Signature(s) Signed: 11/09/2021 5:38:46 PM By: [name] Signed: 11/09/2021 6:00:27 PM By: [name], BSN, RN, CWS, Kim RN, BSN Entered By:  [name] on 11/09/2021 09:13:28 -------------------------------------------------------------------------------- Pain Assessment Details Patient Name: Date of Service: [name], Kelly Mons. 11/09/2021 9:00 A [name]: [id number] Patient Account Number: [id number] Date of Birth/Sex: Treating RN: 10-10-1975 (46 y.o. [name] Primary Care Jatinder Mcdonagh: [name] Other Clinician: Massie [name] Referring Sherril Heyward: Treating Antion Andres/Extender: [name] in Treatment: 11 Active Problems Location of Pain Severity and Description of Pain Patient Has Paino No Site Locations [name], [name] ([id number]) [id number] Page 6 of 8 Pain Management and Medication Current Pain Management: Electronic Signature(s) Signed: 11/09/2021 5:38:46 PM By: [name] Signed: 11/09/2021 6:00:27 PM By: [name], BSN, RN, CWS, Kim RN, BSN Entered By: [name] on 11/09/2021 09:08:33 -------------------------------------------------------------------------------- Patient/Caregiver Education Details Patient Name: Date of Service: [name] 10/23/2023andnbsp9:00 A [name]: [id number] Patient Account Number: [id number] Date of Birth/Gender: Treating RN: [birth year] (46 y.o. [name] Primary Care Physician: [name] Other Clinician: Massie [name] Referring Physician: Treating Physician/Extender: [name] in Treatment: 11 Education Assessment Education Provided To: Patient Education Topics Provided Offloading: Handouts: Other: discussed T contact casting for next visit otal Methods: Explain/Verbal Responses: State content correctly Wound/Skin Impairment: Handouts: Other: continue wound care as directed Methods: Explain/Verbal Responses: State content correctly Electronic Signature(s) Signed: 11/09/2021 5:38:46 PM By: [name] Entered By: [name] on 11/09/2021 09:31:05 [name], [name] ([id number]) [id number] Page 7 of 8 -------------------------------------------------------------------------------- Wound Assessment Details Patient Name: Date of Service: [name]. 11/09/2021 9:00 A [name]: [id number] Patient Account Number: [id number] Date of Birth/Sex: Treating RN: [birth year] (46 y.o. Kelly Forward, [name] Primary Care Kion Huntsberry: [name] Other Clinician: Massie [name] Referring Glennon Kopko: Treating Vernon Maish/Extender: [name] in Treatment: 11 Wound Status Wound Number: 2 Primary Abscess Etiology: Wound Location: Posterior Foot Wound Status: Open Wounding Event: Trauma Comorbid Anemia, Asthma, History of pressure wounds, Osteomyelitis, Date Acquired: 06/18/2021 History: Neuropathy Weeks Of Treatment: 11 Clustered Wound: No Photos Wound Measurements Length: (cm) 0.3 Width: (cm) 0.3 Depth: (cm) 0.2 Area: (cm) 0.071 Volume: (cm) 0.014 % Reduction in Area: 90% % Reduction in Volume: 95.1% Epithelialization: None Wound Description Classification: Full Thickness Without Exposed Support Structures Exudate Amount: Medium Exudate Type: Serosanguineous Exudate Color: red, brown Foul Odor After Cleansing: No Slough/Fibrino No Wound Bed Granulation Amount: Large (67-100%) Exposed Structure Granulation Quality: Pink Fascia Exposed: No Necrotic Amount: Small (1-33%) Fat Layer (Subcutaneous Tissue) Exposed: Yes Necrotic Quality: Adherent Slough Tendon Exposed: No Muscle Exposed: No Joint Exposed: No Bone Exposed: No Treatment Notes Wound #2 (Foot) Wound Laterality: Posterior Cleanser [place] Ancillary Kit - 15 Day Supply Discharge Instruction: Use supplies as instructed; Kit contains: (15)  Saline Bullets; (15) 3x3 Gauze; 15 pr Gloves Soap and [address] [name], [name] ([id number]) [id number] Page 8 of  8 Discharge Instruction: Gently cleanse wound with antibacterial soap, rinse and pat dry prior to dressing wounds Peri-Wound Care Topical Gentamicin Discharge Instruction: Apply as directed by [name]. Primary Dressing Prisma 4.34 (in) Discharge Instruction: Moisten w/normal saline [name] sterile water; Cover wound as directed. Do not remove from wound bed. Secondary Dressing (BORDER) Zetuvit Plus SILICONE BORDER Dressing 4x4 (in/in) Discharge Instruction: Please do not put silicone bordered dressings under wraps. Use non-bordered dressing only. Secured With Compression Wrap Compression Stockings Environmental education officer) Signed: 11/09/2021 5:38:46 PM By: [name] Signed: 11/09/2021 6:00:27 PM By: [name], BSN, RN, CWS, Kim RN, BSN Entered By: [name] on 11/09/2021 09:13:07 -------------------------------------------------------------------------------- Vitals Details Patient Name: Date of Service: Kelly Clock, MA TTIE [name]. 11/09/2021 9:00 A [name]: [id number] Patient Account Number: [id number] Date of Birth/Sex: Treating RN: 1975/05/24 (46 y.o. Kelly Forward, [name] Primary Care Oswin Johal: [name] Other Clinician: Massie [name] Referring Sherree Shankman: Treating Alwaleed Obeso/Extender: [name] in Treatment: 11 Vital Signs Time Taken: 09:06 Temperature (F): 98.6 Height (in): 67 Pulse (bpm): 81 Weight (lbs): 153 Respiratory Rate (breaths/min): 16 Body Mass Index (BMI): 24 Blood Pressure (mmHg): 131/85 Reference Range: 80 - 120 mg / dl Electronic Signature(s) Signed: 11/09/2021 5:38:46 PM By: [name] Entered By: [name] on 11/09/2021 86:51:68

## 2021-11-16 ENCOUNTER — Ambulatory Visit: Payer: Medicare PPO | Admitting: Physician Assistant

## 2021-11-16 ENCOUNTER — Encounter (INDEPENDENT_AMBULATORY_CARE_PROVIDER_SITE_OTHER): Payer: Self-pay

## 2021-11-17 ENCOUNTER — Encounter: Payer: Medicare PPO | Admitting: Physician Assistant

## 2021-11-17 DIAGNOSIS — G600 Hereditary motor and sensory neuropathy: Secondary | ICD-10-CM | POA: Diagnosis not present

## 2021-11-17 DIAGNOSIS — L97512 Non-pressure chronic ulcer of other part of right foot with fat layer exposed: Secondary | ICD-10-CM | POA: Diagnosis not present

## 2021-11-17 DIAGNOSIS — L84 Corns and callosities: Secondary | ICD-10-CM | POA: Diagnosis not present

## 2021-11-17 DIAGNOSIS — L02611 Cutaneous abscess of right foot: Secondary | ICD-10-CM | POA: Diagnosis not present

## 2021-11-17 DIAGNOSIS — M86671 Other chronic osteomyelitis, right ankle and foot: Secondary | ICD-10-CM | POA: Diagnosis not present

## 2021-11-17 DIAGNOSIS — G8929 Other chronic pain: Secondary | ICD-10-CM | POA: Diagnosis not present

## 2021-11-17 DIAGNOSIS — G7100 Muscular dystrophy, unspecified: Secondary | ICD-10-CM | POA: Diagnosis not present

## 2021-11-17 NOTE — Progress Notes (Addendum)
[name], PLATTS ([id number]) [id number] Page 1 of 9 Visit Report for 11/17/2021 Chief Complaint Document Details Patient Name: Date of Service: [name]. 11/17/2021 8:45 A M Medical Record Number: [id number] Patient Account Number: [id number] Date of Birth/Sex: Treating RN: 1975-03-05 (46 y.o. F) Primary Care Provider: [name] Other Clinician: Referring Provider: Treating Provider/Extender: [name] in Treatment: 12 Information Obtained from: Patient Chief Complaint Right foot ulcer Electronic Signature(s) Signed: 11/17/2021 9:12:59 AM By: [name] Entered By: Worthy Keeler on 11/17/2021 09:12:59 -------------------------------------------------------------------------------- Debridement Details Patient Name: Date of Service: [name], [name]. 11/17/2021 8:45 A M Medical Record Number: [id number] Patient Account Number: [id number] Date of Birth/Sex: Treating RN: [birth year] (46 y.o. [name] Primary Care Provider: [name] Other Clinician: Referring Provider: Treating Provider/Extender: [name] in Treatment: 12 Debridement Performed for Assessment: Wound #2 Posterior Foot Performed By: Physician [name], [name] Debridement Type: Debridement Level of Consciousness (Pre-procedure): Awake and Alert Pre-procedure Verification/Time Out Yes - 09:18 Taken: Pain Control: Lidocaine T Area Debrided (L x W): otal 0.4 (cm) x 0.3 (cm) = 0.12 (cm) Tissue and other material debrided: Viable, Non-Viable, Callus, Subcutaneous, Skin: Dermis Level: Skin/Subcutaneous Tissue Debridement Description: Excisional Instrument: Curette Bleeding: Minimum Hemostasis Achieved: Pressure Response to Treatment: Procedure was tolerated well Level of Consciousness (Post- Awake and Alert procedure): Post Debridement Measurements of Total Wound [name], [name]  ([id number]) [id number] Page 2 of 9 Length: (cm) 0.4 Width: (cm) 0.4 Depth: (cm) 0.3 Volume: (cm) 0.038 Character of Wound/Ulcer Post Debridement: Stable Post Procedure Diagnosis Same as Pre-procedure Electronic Signature(s) Signed: 11/17/2021 12:02:04 PM By: Gretta Cool, BSN, RN, CWS, Kim RN, BSN Signed: 11/17/2021 4:04:00 PM By: [name] Entered By: Gretta Cool, BSN, RN, CWS, Kim on 11/17/2021 09:21:11 -------------------------------------------------------------------------------- HPI Details Patient Name: Date of Service: [name], [name]. 11/17/2021 8:45 A M Medical Record Number: [id number] Patient Account Number: [id number] Date of Birth/Sex: Treating RN: 1975/10/13 (46 y.o. F) Primary Care Provider: [name] Other Clinician: Referring Provider: Treating Provider/Extender: [name] in Treatment: 12 History of Present Illness Location: right first metatarsal head plantar aspect Quality: Patient reports experiencing a dull pain to affected area(s). Severity: Patient states wound are getting worse. Duration: Patient has had the wound for > 2 months prior to seeking treatment at the wound center Timing: Pain in wound is Intermittent (comes and goes Context: The wound appeared gradually over time Modifying Factors: Other treatment(s) tried include:pain clinic for her narcotics ssociated Signs and Symptoms: Patient reports having difficulty standing for long periods. A HPI Description: 46 year old patient sent to [name] by her PCP 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 Her notes were reviewed and there was extensive history of opioid treatment and management by the [facility] pain clinic from where she has been terminated. X-ray of the right foot foot was  done on 03/13/2015 and was suggestive of active infection and possible osteomyelitis. her past surgical history includes amputation of the left big toe in 2011 and amputation of the right big toe in 2015. She was sitting the wound clinic at [facility] and we will try and obtain these notes. She is a smoker and smokes about 15 cigarettes a day. 06/12/2015 - x-ray of the right foot -- IMPRESSION:1. Soft tissue  swelling of the plantar surface of the foot. No underlying acute abnormality. 2. Amputation deformity again of the right great toe. Old healed bony deformity noted the right second metatarsal. No acute bony abnormality identified. We have not received any reports from the [facility] hospital yet. She continues to smoke at least 10 cigarettes a day 06/19/2015 -- she has increasing pain in the right foot and also has had some low-grade fever. Her vascular test is scheduled for tomorrow and her MRIs not till next week. Readmission: 08-20-2021 upon evaluation today patient presents for initial inspection here in our clinic although she has been seen that was back in 2017. At that time she was having a issue with her right foot. With that being said upon inspection today she is actually having issues with the right foot as well on the medial aspect where she has some breakdown due to what I am assuming is pressure to the foot location. 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 I did review her culture report which showed evidence of bacteria including hemolytic Streptococcus group A as well as Staphylococcus aureus. The patient is currently on doxycycline and tells me that this is getting much better. T opical gentamicin is also being utilized. Also did review her arterial studies which show that she has on the right and ABI of 1.15  which is triphasic and appears to be excellent there is no significant peripheral artery disease noted the left is also. With ABI of 1.11 and a good report in that regard as well. Lastly I did also review her MRI of the right forefoot which was performed on 07-06-2021. This shows that she does have mild subcortical marrow edema in the stump of the first metatarsal concerning for osteomyelitis. There is also a malunited fracture of the second metatarsal. The patient has bone marrow edema in the base of the second third fourth and fifth metatarsals concerning for stress reaction obviously this infection is going to be an issue for [name] here as well she needs to be treated appropriately and I do believe that the doxycycline is a good option right now although I may need to #1 extend this and #2 plan for even altering or adding to the treatment regimen depending on how things progress. Patient voiced understanding.Marland Kitchen 09-01-2021 upon evaluation today patient appears to be doing excellent in regard to her wound. She is actually showing signs of excellent improvement which is great news. Fortunately I do not see any signs of active infection locally or systemically at this point which is great news and overall I am extremely pleased LEILYNN, PILAT ([id number]) [id number] Page 3 of 9 with where things stand today. I do not see any evidence of active infection locally or systemically. 09-07-2021 upon evaluation today patient appears to be doing well with regard to her foot ulcer. This is actually showing signs of improvement which is great news. Fortunately there does not appear to be any signs of active infection locally or systemically at this time which is as well. 09-22-2021 upon evaluation today patient appears to be doing well currently in regard to her wound. This is measuring smaller and looking better were slowly making progress here. Fortunately there does not appear to be any  evidence of active infection locally or systemically at this time. Upon inspection patient's wound  bed showed evidence of good granulation and epithelization at this point. 10-05-2021 upon evaluation today patient's wound shows a lot of callus buildup but the surface of the wound is actually doing quite well. She is going require some sharp debridement to clearway some of the necrotic debris. 10-19-2021 upon evaluation today patient appears to be doing better in regard to her wound which is actually measuring little bit smaller today. Fortunately there does not appear to be any signs of significant infection she does have quite a bit of callus we will get have to work on that today. Otherwise I think we are headed in the appropriate direction. 11-09-2021 upon evaluation today patient appears to be doing well in regard to her wound from the standpoint of infection and actually appears to be doing better she actually just completed the doxycycline. With that being said I am hopeful that this took care of the osteomyelitis although that something that we will definitely keep a close eye on. With that being said I do believe that she is still having tremendous amount of callus buildup which I believe in turn is a result of her having issues here with Friction and callus buildup. With that being said I do believe that the patient would benefit here from a total contact cast I discussed that with her today. I do want to get more aggressive to get this closed as I think that if we do not then she is more likely to have a long standing issue here going forward. 11-17-2021 upon evaluation today patient appears to be doing well although her wound is still open. I do think that she is ready to be placed in a total contact cast. She actually has come back on Thursday to have this changed out. Fortunately and overall I do not see any signs of worsening and I think that we should be able to get this on without any  complication or problem. Electronic Signature(s) Signed: 11/17/2021 12:54:57 PM By: [name] Entered By: Worthy Keeler on 11/17/2021 12:54:56 -------------------------------------------------------------------------------- Physical Exam Details Patient Name: Date of Service: [name]. 11/17/2021 8:45 A M Medical Record Number: [id number] Patient Account Number: [id number] Date of Birth/Sex: Treating RN: 1975-01-28 (46 y.o. F) Primary Care Provider: [name] Other Clinician: Referring Provider: Treating Provider/Extender: [name] in Treatment: 13 Constitutional Well-nourished and well-hydrated in no acute distress. Respiratory normal breathing without difficulty. Psychiatric this patient is able to make decisions and demonstrates good insight into disease process. Alert and Oriented x 3. pleasant and cooperative. Notes Patient's wound currently showed signs of some callus buildup fortunately this does not appear to be too significant which is great news and overall I am extremely pleased with where we stand currently I did perform debridement following this we did apply a total contact cast today which I think should do quite well for her. Electronic Signature(s) Signed: 11/17/2021 12:55:21 PM By: [name] Entered By: Worthy Keeler on 11/17/2021 12:55:21 Barsamian, Greydis Jerilynn Mages ([id number]) [id number] Page 4 of 9 -------------------------------------------------------------------------------- Physician Orders Details Patient Name: Date of Service: [name]. 11/17/2021 8:45 A M Medical Record Number: [id number] Patient Account Number: [id number] Date of Birth/Sex: Treating RN: Jul 20, 1975 (46 y.o. 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 Nurse Visit as needed Bathing/ Shower/ Hygiene May shower with wound dressing protected with water repellent cover or cast protector. No tub bath. Anesthetic (Use 'Patient Medications' Section for Anesthetic Order Entry) Lidocaine applied to wound bed Edema Control - Lymphedema / Segmental Compressive Device / Other Elevate, Exercise Daily and A void Standing for Long Periods of Time. Elevate legs to the level of the heart and pump ankles as often as possible Elevate leg(s) parallel to the floor when sitting. Off-Loading Total Contact Cast to Right Lower Extremity Medications-Please add to medication list. Wound #2 Posterior Foot Take one 595m Tylenol (A cetaminophen) and one 2048mMotrin (Ibuprofen) every 6 hours for pain. Do not take ibuprofen if you are on blood thinners or have stomach ulcers. - take every 4 hours while awake Wound Treatment Wound #2 - Foot Wound Laterality: Posterior Cleanser: Byram Ancillary Kit - 15 Day Supply (Generic) 3 x Per Day/30 Days Discharge Instructions: Use supplies as instructed; Kit contains: (15) Saline Bullets; (15) 3x3 Gauze; 15 pr Gloves Cleanser: Soap and Water 3 x Per Day/30 Days Discharge Instructions: Gently cleanse wound with antibacterial soap, rinse and pat dry prior to dressing wounds Topical: Gentamicin 3 x Per Day/30 Days Discharge Instructions: Apply as directed by provider. Prim Dressing: Prisma 4.34 (in) 3 x Per Day/30 Days ary Discharge Instructions: Moisten w/normal saline or sterile water; Cover wound as directed. Do not remove from wound bed. Secondary  Dressing: Zetuvit Plus 4x4 (in/in) 3 x Per Day/30 Days Electronic Signature(s) NEYUKARI, [id number] Page 5 of 9 Signed: 11/17/2021 12:02:04 PM By: WoGretta CoolBSN, RN, CWS, Kim RN, BSN Signed: 11/17/2021 4:04:00 PM By: StWorthy KeelerA-C Entered By: WoGretta CoolSN, RN, CWS, Kim on 11/17/2021 09:22:01 -------------------------------------------------------------------------------- Problem List Details Patient Name: Date of Service: NETheodoro ClockMAHarrison Mons10/31/2023 8:45 A M Medical Record Number: [id number] Account Number: [id number] of Birth/Sex: Treating RN: [id number].o. 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 F) Primary Care Provider: SoTommi Rumpsther Clinician: Referring Provider: Treating Provider/Extender: StCecelia Byarsn  Treatment: 12 Subjective Chief Complaint Information obtained from Patient Right foot ulcer History of Present Illness (HPI) The following HPI elements were documented for the patient's wound: Location: right first metatarsal head plantar aspect Quality: Patient reports experiencing a dull pain to affected area(s). Severity: Patient states wound are getting worse. Duration: Patient has had the wound for > 2 months prior to seeking  treatment at the wound center Timing: Pain in wound is Intermittent (comes and goes Context: The wound appeared gradually over time Modifying Factors: Other treatment(s) tried include:pain clinic for her narcotics Associated Signs and Symptoms: Patient reports having difficulty standing for long periods. 46 year old patient sent to [name] by her PCP 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 Her notes were reviewed and there was extensive history of opioid treatment and management by the [facility] pain clinic from where she has been terminated. X-ray of the right foot foot was done on 03/13/2015 and was suggestive of active infection and possible osteomyelitis. her past surgical history includes amputation of the left big toe in 2011 and amputation of the right big toe in 2015. She was sitting the wound clinic at [facility] and we will try and obtain these notes. She is a smoker and smokes about 15 cigarettes a day. 06/12/2015 - x-ray of the right foot -- IMPRESSION:1. Soft tissue swelling of the plantar surface of the foot. No underlying acute abnormality. 2. Amputation deformity again of the right great toe. Old healed bony deformity noted the right second metatarsal. No acute bony abnormality identified. We have not received any reports from the [facility] hospital yet. She continues to smoke at least 10  cigarettes a day 06/19/2015 -- she has increasing pain in the right foot and also has had some low-grade fever. Her vascular test is scheduled for tomorrow and her MRIs not till next week. Readmission: 08-20-2021 upon evaluation today patient presents for initial inspection here in our clinic although she has been seen that was back in 2017. At that time she was having a issue with her right foot. With that being said upon inspection today she is actually having issues with the right foot as well on the medial aspect where she has some breakdown due to what I am assuming is pressure to the foot location. 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 I did review her culture report which showed evidence of bacteria including hemolytic Streptococcus group A as well as Staphylococcus aureus. The patient is currently on doxycycline and tells me that this is getting much better. T opical gentamicin is also being utilized. Also did review her arterial studies which show that she has on the right and ABI of 1.15 which is triphasic and appears to be excellent there is no significant peripheral artery disease noted the left is also. With ABI of 1.11 and a good report in that regard as well. Lastly I did also review her MRI of the right forefoot which was performed on 07-06-2021. This shows that she does have mild subcortical marrow edema in the stump of the first metatarsal concerning for osteomyelitis. There is also a malunited fracture of the second metatarsal. The patient has bone marrow edema in the base of the second third fourth and fifth metatarsals concerning for stress reaction obviously this infection is going to be an issue for [name] here as well she needs to be treated appropriately and I do believe that the doxycycline is a good  option right now although I may need to #1 extend this and #2 plan for even altering or adding  to the treatment regimen depending on how things progress. Patient voiced understanding.Marland Kitchen 09-01-2021 upon evaluation today patient appears to be doing excellent in regard to her wound. She is actually showing signs of excellent improvement which is great news. Fortunately I do not see any signs of active infection locally or systemically at this point which is great news and overall I am extremely pleased with where things stand today. I do not see any evidence of active infection locally or systemically. 09-07-2021 upon evaluation today patient appears to be doing well with regard to her foot ulcer. This is actually showing signs of improvement which is great news. Fortunately there does not appear to be any signs of active infection locally or systemically at this time which is as well. 09-22-2021 upon evaluation today patient appears to be doing well currently in regard to her wound. This is measuring smaller and looking better were slowly making progress here. Fortunately there does not appear to be any evidence of active infection locally or systemically at this time. Upon inspection patient's wound bed showed evidence of good granulation and epithelization at this point. 10-05-2021 upon evaluation today patient's wound shows a lot of callus buildup but the surface of the wound is actually doing quite well. She is going require some sharp debridement to clearway some of the necrotic debris. 10-19-2021 upon evaluation today patient appears to be doing better in regard to her wound which is actually measuring little bit smaller today. Fortunately there does not appear to be any signs of significant infection she does have quite a bit of callus we will get have to work on that today. Otherwise I think we are headed in the appropriate direction. 11-09-2021 upon evaluation today patient appears to be doing well  in regard to her wound from the standpoint of infection and actually appears to be doing better she actually just completed the doxycycline. With that being said I am hopeful that this took care of the osteomyelitis although that something that we will definitely keep a close eye on. With that being said I do believe that she is still having tremendous amount of callus buildup which I believe in turn is a result of her having issues here with Friction and callus buildup. With that being said I do believe that the patient would benefit here from a total contact cast I discussed that with her today. I do want to get more aggressive to get this closed as I think that if we do not then she is more likely to have a long standing issue here going forward. 11-17-2021 upon evaluation today patient appears to be doing well although her wound is still open. I do think that she is ready to be placed in a total contact cast. She actually has come back on Thursday to have this changed out. Fortunately and overall I do not see any signs of worsening and I think that we should be able to get this on without any complication or problem. ALYCE, INSCORE ([id number]) [id number] Page 7 of 9 Objective Constitutional Well-nourished and well-hydrated in no acute distress. Vitals Time Taken: 8:56 AM, Height: 67 in, Weight: 153 lbs, BMI: 24, Temperature: 99.3 F, Pulse: 86 bpm, Respiratory Rate: 16 breaths/min, Blood Pressure: 119/75 mmHg. Respiratory normal breathing without difficulty. Psychiatric this patient is able to make decisions and demonstrates good insight into disease process. Alert and Oriented x 3. pleasant and cooperative. General Notes: Patient's wound currently showed signs of some callus buildup fortunately this  does not appear to be too significant which is great news and overall I am extremely pleased with where we stand currently I did perform debridement following  this we did apply a total contact cast today which I think should do quite well for her. Integumentary (Hair, Skin) Wound #2 status is Open. Original cause of wound was Trauma. The date acquired was: 06/18/2021. The wound has been in treatment 12 weeks. The wound is located on the Posterior Foot. The wound measures 0.4cm length x 0.3cm width x 0.3cm depth; 0.094cm^2 area and 0.028cm^3 volume. There is Fat Layer (Subcutaneous Tissue) exposed. There is undermining starting at 9:00 and ending at 12:00 with a maximum distance of 0.3cm. There is a medium amount of serosanguineous drainage noted. There is large (67-100%) pink granulation within the wound bed. There is a small (1-33%) amount of necrotic tissue within the wound bed including Adherent Slough. 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 There was a Excisional Skin/Subcutaneous Tissue Debridement with a total area of 0.12 sq cm performed by [name], [name]. With the following instrument(s): Curette to remove Viable and Non-Viable tissue/material. Material removed includes Callus, Subcutaneous Tissue, and Skin: Dermis after achieving pain control using Lidocaine. No specimens were taken. A time out was conducted at 09:18, prior to the start of the procedure. A Minimum amount of bleeding was controlled with Pressure. The procedure was tolerated well. Post Debridement Measurements: 0.4cm length x 0.4cm width x 0.3cm depth; 0.038cm^3 volume. Character of Wound/Ulcer Post Debridement is stable. Post procedure Diagnosis Wound #2: Same as Pre-Procedure Plan Follow-up Appointments: Return Appointment in 1 week. Nurse Visit as  needed Bathing/ Shower/ Hygiene: May shower with wound dressing protected with water repellent cover or cast protector. No tub bath. Anesthetic (Use 'Patient Medications' Section for Anesthetic Order Entry): Lidocaine applied to wound bed Edema Control - Lymphedema / Segmental Compressive Device / Other: Elevate, Exercise Daily and Avoid Standing for Long Periods of Time. Elevate legs to the level of the heart and pump ankles as often as possible Elevate leg(s) parallel to the floor when sitting. Off-Loading: T Contact Cast to Right Lower Extremity otal Medications-Please add to medication list.: Wound #2 Posterior Foot: [name] ([id number]) [id number] Page 8 of 9 T one 559m Tylenol (Acetaminophen) and one 2027mMotrin (Ibuprofen) every 6 hours for pain. Do not take ibuprofen if you are on blood thinners or ake have stomach ulcers. - take every 4 hours while awake WOUND #2: - Foot Wound Laterality: Posterior Cleanser: Byram Ancillary Kit - 15 Day Supply (Generic) 3 x Per Day/30 Days Discharge Instructions: Use supplies as instructed; Kit contains: (15) Saline Bullets; (15) 3x3 Gauze; 15 pr Gloves Cleanser: Soap and Water 3 x Per Day/30 Days Discharge Instructions: Gently cleanse wound with antibacterial soap, rinse and pat dry prior to dressing wounds Topical: Gentamicin 3 x Per Day/30 Days Discharge Instructions: Apply as directed by provider. Prim Dressing: Prisma 4.34 (in) 3 x Per Day/30 Days ary Discharge Instructions: Moisten w/normal saline or sterile water; Cover wound as directed. Do not remove from wound bed. Secondary Dressing: Zetuvit Plus 4x4 (in/in) 3 x Per Day/30 Days 1. We will continue with total contact casting will be seeing her on Thursday to  change this out. Hopefully she will be doing excellent and we get this closed quickly. 2. I am also can recommend that we have her continue to monitor for any signs of infection or  worsening if anything changes she knows contact the office and let me know. We will see patient back for reevaluation in 1 week here in the clinic. If anything worsens or changes patient will contact our office for additional recommendations. Electronic Signature(s) Signed: 11/17/2021 12:55:36 PM By: [name] Entered By: Worthy Keeler on 11/17/2021 12:55:36 -------------------------------------------------------------------------------- SuperBill Details Patient Name: Date of Service: [name], [name]. 11/17/2021 Medical Record Number: [id number] Patient Account Number: [id number] Date of Birth/Sex: Treating RN: 31-May-1975 (46 y.o. 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 Corns and callosities Facility Procedures : CPT4 Code: 18403754 Description: 36067 - DEB SUBQ TISSUE 20 SQ CM/< ICD-10 Diagnosis Description L97.512 Non-pressure chronic ulcer of other part of right foot with fat layer exposed Modifier: Quantity: 1 Physician Procedures : CPT4 Code Description Modifier [id number] 24818 - WC PHYS SUBQ TISS 20 SQ CM ICD-10 Diagnosis Description L97.512 Non-pressure chronic ulcer of other part of right foot with fat layer exposed Tungate, Alexsis M ([id number])  [id number] Page 9 of Quantity: 1 9 Electronic Signature(s) Signed: 11/17/2021 12:55:58 PM By: [name] Entered By: Worthy Keeler on 11/17/2021 12:55:58

## 2021-11-17 NOTE — Progress Notes (Addendum)
Kelly, [name] ([id number]) [id number] Page 1 of 7 Visit Report for 11/17/2021 Arrival Information Details Patient Name: Date of Service: [name]. 11/17/2021 8:45 A M Medical Record Number: [id number] Patient Account Number: [id number] Date of Birth/Sex: Treating RN: 1975-08-13 (46 y.o. [name] Primary Care Cynde Menard: [name] Other Clinician: Referring Vann Okerlund: Treating Aryah Doering/Extender: [name] in Treatment: 12 Visit Information History Since Last Visit Added or deleted any medications: No Patient Arrived: Ambulatory Had a fall or experienced change in No Arrival Time: 08:54 activities of daily living that may affect Accompanied By: self risk of falls: Transfer Assistance: None Has Dressing in Place as Prescribed: Yes Patient Identification Verified: Yes Pain Present Now: No Secondary Verification Process Completed: Yes Patient Requires Transmission-Based Precautions: No Patient Has Alerts: Yes Patient Alerts: ABI R 1.15 02/04/21 ABI L 1.11 02/04/21 Electronic Signature(s) Signed: 11/17/2021 12:02:04 PM By: Gretta Cool, BSN, RN, CWS, Kim RN, BSN Entered By: Gretta Cool, BSN, RN, CWS, Kim on 11/17/2021 08:55:55 -------------------------------------------------------------------------------- Encounter Discharge Information Details Patient Name: Date of Service: [name], [name] 11/17/2021 8:45 A M Medical Record Number: [id number] Patient Account Number: [id number] Date of Birth/Sex: Treating RN: April 06, 1975 (46 y.o. [name] Primary Care Penney Domanski: [name] Other Clinician: Referring Kleber Crean: Treating Effie Wahlert/Extender: [name] in Treatment: 12 Encounter Discharge Information Items Post Procedure Vitals Discharge Condition: Stable Unable to obtain vitals Reason: timing Ambulatory Status: Ambulatory Discharge Destination: Home Transportation: Other Accompanied  By: some one in lobby waiting for her Schedule Follow-up Appointment: Yes Clinical Summary of Care: Electronic Signature(s) NUSRAT, ENCARNACION ([id number]) [id number] Page 2 of 7 Signed: 11/17/2021 12:09:17 PM By: Gretta Cool, BSN, RN, CWS, Kim RN, BSN Entered By: Gretta Cool, BSN, RN, CWS, Kim on 11/17/2021 12:09:16 -------------------------------------------------------------------------------- Lower Extremity Assessment Details Patient Name: Date of Service: [name], Kelly. 11/17/2021 8:45 A M Medical Record Number: [id number] Patient Account Number: [id number] Date of Birth/Sex: Treating RN: Jan 09, 1976 (46 y.o. [name] Primary Care Sem Mccaughey: [name] Other Clinician: Referring Clair Alfieri: Treating Arick Mareno/Extender: [name] in Treatment: 12 Edema Assessment Assessed: [name]: No] Patrice Paradise: Yes] Edema: [Left: N] [Right: o] Vascular Assessment Pulses: Dorsalis Pedis Palpable: [Right:Yes] Electronic Signature(s) Signed: 11/17/2021 12:02:04 PM By: Gretta Cool, BSN, RN, CWS, Kim RN, BSN Entered By: Gretta Cool, BSN, RN, CWS, Kim on 11/17/2021 81:27:51 -------------------------------------------------------------------------------- Multi Wound Chart Details Patient Name: Date of Service: [name], [name] 11/17/2021 8:45 A M Medical Record Number: [id number] Patient Account Number: [id number] Date of Birth/Sex: Treating RN: [birth year] (46 y.o. [name] Primary Care Shelli Portilla: [name] Other Clinician: Referring Robbin Escher: Treating Athanasios Heldman/Extender: [name] in Treatment: 12 Vital Signs Height(in): 67 Pulse(bpm): 20 Weight(lbs): 153 Blood Pressure(mmHg): 119/75 Body Mass Index(BMI): 24 Temperature(F): 99.3 Respiratory Rate(breaths/min): 16 [2:Photos:] [N/A:N/A] Posterior Foot N/A N/A Wound Location: Trauma N/A N/A Wounding Event: Abscess N/A N/A Primary Etiology: Anemia, Asthma,  History of pressure N/A N/A Comorbid History: wounds, Osteomyelitis, Neuropathy 06/18/2021 N/A N/A Date Acquired: 12 N/A N/A Weeks of Treatment: Open N/A N/A Wound Status: No N/A N/A Wound Recurrence: 0.4x0.3x0.3 N/A N/A Measurements L x W x D (cm) 0.094 N/A N/A A (cm) : rea 0.028 N/A N/A Volume (cm) : 86.70% N/A N/A % Reduction in A rea: 90.10% N/A N/A % Reduction in Volume: 9 Starting Position 1 (o'[name]): 12 Ending Position 1 (o'[name]): 0.3 Maximum Distance 1 (cm): Yes N/A N/A Undermining: Full Thickness Without Exposed N/A N/A Classification: Support Structures  Medium N/A N/A Exudate Amount: Serosanguineous N/A N/A Exudate Type: red, brown N/A N/A Exudate Color: Large (67-100%) N/A N/A Granulation Amount: Pink N/A N/A Granulation Quality: Small (1-33%) N/A N/A Necrotic Amount: Fat Layer (Subcutaneous Tissue): Yes N/A N/A Exposed Structures: Fascia: No Tendon: No Muscle: No Joint: No Bone: No None N/A N/A Epithelialization: Treatment Notes Electronic Signature(s) Signed: 11/17/2021 12:02:04 PM By: Gretta Cool, BSN, RN, CWS, Kim RN, BSN Entered By: Gretta Cool, BSN, RN, CWS, Kim on 11/17/2021 09:19:51 -------------------------------------------------------------------------------- Multi-Disciplinary Care Plan Details Patient Name: Date of Service: [name], [name] 11/17/2021 8:45 A M Medical Record Number: [id number] Patient Account Number: [id number] Date of Birth/Sex: Treating RN: Mar 25, 1975 (46 y.o. [name] Primary Care Iva Montelongo: [name] Other Clinician: Referring Fujie Dickison: Treating Brande Uncapher/Extender: [name] in Treatment: 12 Active Inactive Wound/Skin Impairment Nursing Diagnoses: ALLICE, GARRO ([id number]) [id number] Page 4 of 7 Knowledge deficit related to ulceration/compromised skin integrity Goals: Patient/caregiver will verbalize understanding of skin care regimen Date  Initiated: 08/20/2021 Target Resolution Date: 09/20/2021 Goal Status: Active Ulcer/skin breakdown will have a volume reduction of 30% by week 4 Date Initiated: 08/20/2021 Target Resolution Date: 09/20/2021 Goal Status: Active Ulcer/skin breakdown will have a volume reduction of 50% by week 8 Date Initiated: 08/20/2021 Target Resolution Date: 10/20/2021 Goal Status: Active Ulcer/skin breakdown will have a volume reduction of 80% by week 12 Date Initiated: 08/20/2021 Target Resolution Date: 11/20/2021 Goal Status: Active Ulcer/skin breakdown will heal within 14 weeks Date Initiated: 08/20/2021 Target Resolution Date: 12/20/2021 Goal Status: Active Interventions: Assess patient/caregiver ability to obtain necessary supplies Assess patient/caregiver ability to perform ulcer/skin care regimen upon admission and as needed Assess ulceration(s) every visit Notes: Electronic Signature(s) Signed: 11/17/2021 12:02:04 PM By: Gretta Cool, BSN, RN, CWS, Kim RN, BSN Entered By: Gretta Cool, BSN, RN, CWS, Kim on 11/17/2021 09:05:10 -------------------------------------------------------------------------------- Pain Assessment Details Patient Name: Date of Service: [name], [name] 11/17/2021 8:45 A M Medical Record Number: [id number] Patient Account Number: [id number] Date of Birth/Sex: Treating RN: [birth year] (46 y.o. [name] Primary Care Trindon Dorton: [name] Other Clinician: Referring Jaelyn Bourgoin: Treating Alec Mcphee/Extender: [name] in Treatment: 12 Active Problems Location of Pain Severity and Description of Pain Patient Has Paino No Site Locations GUENEVERE, ROORDA M ([id number]) [id number] Page 5 of 7 Pain Management and Medication Current Pain Management: Notes Patient denies pain at this time. Electronic Signature(s) Signed: 11/17/2021 12:02:04 PM By: Gretta Cool, BSN, RN, CWS, Kim RN, BSN Entered By: Gretta Cool, BSN, RN, CWS, Kim on 11/17/2021  08:57:35 -------------------------------------------------------------------------------- Patient/Caregiver Education Details Patient Name: Date of Service: [name] 10/31/2023andnbsp8:45 [place] Record Number: [id number] Patient Account Number: [id number] Date of Birth/Gender: Treating RN: 16-Aug-1975 (46 y.o. [name] Primary Care Physician: [name] Other Clinician: Referring Physician: Treating Physician/Extender: [name] in Treatment: 12 Education Assessment Education Provided To: Patient Education Topics Provided Offloading: Handouts: What is Offloadingo, Other: Do not get cast wet. Call us ASAP if you feel any discomfort. If we are not available, go to ER. Methods: Explain/Verbal Responses: State content correctly Wound/Skin Impairment: Electronic Signature(s) Signed: 11/17/2021 5:18:26 PM By: Gretta Cool, BSN, RN, CWS, Kim RN, BSN Entered By: Gretta Cool, BSN, RN, CWS, Kim on 11/17/2021 12:07:19 -------------------------------------------------------------------------------- Wound Assessment Details Patient Name: Date of Service: [name]. 11/17/2021 8:45 A M Medical Record Number: [id number] Patient Account Number: [id number] Date of Birth/Sex: Treating RN: March 27, 1975 (46 y.o. [name] Primary Care Shakerria Parran: [name] Other  Clinician: Referring Emillio Ngo: Treating Mark Benecke/Extender: [name] in Treatment: [address] [place] Jerilynn Mages ([id number]) [id number] Page 6 of 7 Wound Status Wound Number: 2 Primary Abscess Etiology: Wound Location: Posterior Foot Wound Status: Open Wounding Event: Trauma Comorbid Anemia, Asthma, History of pressure wounds, Osteomyelitis, Date Acquired: 06/18/2021 History: Neuropathy Weeks Of Treatment: 12 Clustered Wound: No Photos Wound Measurements Length: (cm) 0.4 Width: (cm) 0.3 Depth: (cm) 0.3 Area: (cm) 0.094 Volume:  (cm) 0.028 % Reduction in Area: 86.7% % Reduction in Volume: 90.1% Epithelialization: None Undermining: Yes Starting Position (o'[name]): 9 Ending Position (o'[name]): 12 Maximum Distance: (cm) 0.3 Wound Description Classification: Full Thickness Without Exposed Support Structures Exudate Amount: Medium Exudate Type: Serosanguineous Exudate Color: red, brown Foul Odor After Cleansing: No Slough/Fibrino No Wound Bed Granulation Amount: Large (67-100%) Exposed Structure Granulation Quality: Pink Fascia Exposed: No Necrotic Amount: Small (1-33%) Fat Layer (Subcutaneous Tissue) Exposed: Yes Necrotic Quality: Adherent Slough Tendon Exposed: No Muscle Exposed: No Joint Exposed: No Bone Exposed: No Treatment Notes Wound #2 (Foot) Wound Laterality: Posterior Cleanser Byram Ancillary Kit - 15 Day Supply Discharge Instruction: Use supplies as instructed; Kit contains: (15) Saline Bullets; (15) 3x3 Gauze; 15 pr Gloves Soap and Water Discharge Instruction: Gently cleanse wound with antibacterial soap, rinse and pat dry prior to dressing wounds Peri-Wound Care Topical Gentamicin Discharge Instruction: Apply as directed by Coreyon Nicotra. Primary Dressing Prisma 4.34 (in) Discharge Instruction: Moisten w/normal saline or sterile water; Cover wound as directed. Do not remove from wound bed. Secondary Dressing Zetuvit Plus 4x4 (in/in) Secured With Compression DEVONA, HOLMES ([id number]) [id number] Page 7 of 7 Compression Stockings Add-Ons Notes TCC applied to right leg. Return in 3 days for cast change. Electronic Signature(s) Signed: 11/17/2021 12:02:04 PM By: Gretta Cool, BSN, RN, CWS, Kim RN, BSN Entered By: Gretta Cool, BSN, RN, CWS, Kim on 11/17/2021 09:02:05 -------------------------------------------------------------------------------- Vitals Details Patient Name: Date of Service: [name], [name] 11/17/2021 8:45 A M Medical Record Number:  [id number] Patient Account Number: [id number] Date of Birth/Sex: Treating RN: 26-Feb-1975 (46 y.o. Charolette Forward, Kim Primary Care Bryson Palen: [name] Other Clinician: Referring Bodhi Moradi: Treating Victormanuel Mclure/Extender: [name] in Treatment: 12 Vital Signs Time Taken: 08:56 Temperature (F): 99.3 Height (in): 67 Pulse (bpm): 86 Weight (lbs): 153 Respiratory Rate (breaths/min): 16 Body Mass Index (BMI): 24 Blood Pressure (mmHg): 119/75 Reference Range: 80 - 120 mg / dl Electronic Signature(s) Signed: 11/17/2021 12:02:04 PM By: Gretta Cool, BSN, RN, CWS, Kim RN, BSN Entered By: Gretta Cool, BSN, RN, CWS, Kim on 11/17/2021 08:57:06

## 2021-11-19 ENCOUNTER — Encounter: Payer: Medicare PPO | Attending: Physician Assistant | Admitting: Physician Assistant

## 2021-11-19 DIAGNOSIS — L02415 Cutaneous abscess of right lower limb: Secondary | ICD-10-CM | POA: Diagnosis not present

## 2021-11-19 DIAGNOSIS — M86671 Other chronic osteomyelitis, right ankle and foot: Secondary | ICD-10-CM | POA: Insufficient documentation

## 2021-11-19 DIAGNOSIS — Z89411 Acquired absence of right great toe: Secondary | ICD-10-CM | POA: Diagnosis not present

## 2021-11-19 DIAGNOSIS — G7100 Muscular dystrophy, unspecified: Secondary | ICD-10-CM | POA: Insufficient documentation

## 2021-11-19 DIAGNOSIS — L97512 Non-pressure chronic ulcer of other part of right foot with fat layer exposed: Secondary | ICD-10-CM | POA: Diagnosis not present

## 2021-11-19 DIAGNOSIS — L84 Corns and callosities: Secondary | ICD-10-CM | POA: Insufficient documentation

## 2021-11-19 DIAGNOSIS — G600 Hereditary motor and sensory neuropathy: Secondary | ICD-10-CM | POA: Diagnosis not present

## 2021-11-19 DIAGNOSIS — Z89412 Acquired absence of left great toe: Secondary | ICD-10-CM | POA: Diagnosis not present

## 2021-11-19 NOTE — Progress Notes (Addendum)
[name] ([id number]) [id number] Page 1 of 8 Visit Report for 11/19/2021 Chief Complaint Document Details Patient Name: Date of Service: [name]. 11/19/2021 9:45 A [name]: [id number] Patient Account Number: [id number] Date of Birth/Sex: Treating RN: 09-Aug-1975 (46 y.o. [name] Primary Care Provider: [name] Other Clinician: [name] Referring Provider: Treating Provider/Extender: [name] in Treatment: 13 Information Obtained from: Patient Chief Complaint Right [name] ulcer Electronic Signature(s) Signed: 11/19/2021 10:12:15 AM By: [name] Entered By: Worthy Keeler on 11/19/2021 10:12:15 -------------------------------------------------------------------------------- HPI Details Patient Name: Date of Service: [name], [name]. 11/19/2021 9:45 A [name]: [id number] Patient Account Number: [id number] Date of Birth/Sex: Treating RN: December 02, 1975 (46 y.o. [name] Primary Care Provider: [name] Other Clinician: [name] Referring Provider: Treating Provider/Extender: [name] in Treatment: 13 History of Present Illness Location: right first metatarsal head plantar aspect Quality: Patient reports experiencing a dull pain to affected area(s). Severity: Patient states wound are getting worse. Duration: Patient has had the wound for > 2 months prior to seeking treatment at the wound center Timing: Pain in wound is Intermittent (comes and goes Context: The wound appeared gradually over time Modifying Factors: Other treatment(s) tried include:pain clinic for her narcotics ssociated Signs and Symptoms: Patient reports having difficulty standing for long periods. A HPI Description: 46 year old patient sent to [name] by her PCP [name] who saw her recently on 05/15/2015 for chronic pain related to Charcot  [name] and subsequent problems with her feet with a history of bilateral great toe amputations for osteomyelitis. Her notes were reviewed and there was extensive history of opioid treatment and management by the [facility] pain clinic from where she has been terminated. X-ray of the right [name] [name] was done on 03/13/2015 and was suggestive of active infection and possible osteomyelitis. her past surgical history includes amputation of the left big toe in 2011 and amputation of the right big toe in 2015. She was sitting the wound clinic at [facility] and we will try and obtain these notes. She is a smoker and smokes about 15 cigarettes a day. [name], [name] ([id number]) [id number] Page 2 of 8 06/12/2015 - x-ray of the right [name] -- IMPRESSION:1. Soft tissue swelling of the plantar surface of the [name]. No underlying acute abnormality. 2. Amputation deformity again of the right great toe. Old healed bony deformity noted the right second metatarsal. No acute bony abnormality identified. We have not received any reports from the [facility] hospital yet. She continues to smoke at least 10 cigarettes a day 06/19/2015 -- she has increasing pain in the right [name] and also has had some low-grade fever. Her vascular test is scheduled for tomorrow and her MRIs not till next week. Readmission: 08-20-2021 upon evaluation today patient presents for initial inspection here in our clinic although she has been seen that was back in 2017. At that time she was having a issue with her right [name]. With that being said upon inspection today she is actually having issues with the right [name] as well on the medial aspect where she has some breakdown due to what I am assuming is pressure to the [name] location. 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 I  did review her culture report which showed evidence of bacteria including hemolytic Streptococcus group A as well as Staphylococcus aureus. The patient is currently on doxycycline and tells me that this is getting much better. T opical gentamicin is also being utilized. Also did review her arterial studies which show that she has on the right and ABI of 1.15 which is triphasic and appears to be excellent there is no significant peripheral artery disease noted the left is also. With ABI of 1.11 and a good report in that regard as well. Lastly I did also review her MRI of the right forefoot which was performed on 07-06-2021. This shows that she does have mild subcortical marrow edema in the stump of the first metatarsal concerning for osteomyelitis. There is also a malunited fracture of the second metatarsal. The patient has bone marrow edema in the base of the second third fourth and fifth metatarsals concerning for stress reaction obviously this infection is going to be an issue for [name] here as well she needs to be treated appropriately and I do believe that the doxycycline is a good option right now although I may need to #1 extend this and #2 plan for even altering or adding to the treatment regimen depending on how things progress. Patient voiced understanding.Marland Kitchen 09-01-2021 upon evaluation today patient appears to be doing excellent in regard to her wound. She is actually showing signs of excellent improvement which is great news. Fortunately I do not see any signs of active infection locally or systemically at this point which is great news and overall I am extremely pleased with where things stand today. I do not see any evidence of active infection locally or systemically. 09-07-2021 upon evaluation today patient appears to be doing well with regard to her [name] ulcer. This is actually showing signs of improvement which is great news. Fortunately there does not appear to  be any signs of active infection locally or systemically at this time which is as well. 09-22-2021 upon evaluation today patient appears to be doing well currently in regard to her wound. This is measuring smaller and looking better were slowly making progress here. Fortunately there does not appear to be any evidence of active infection locally or systemically at this time. Upon inspection patient's wound bed showed evidence of good granulation and epithelization at this point. 10-05-2021 upon evaluation today patient's wound shows a lot of callus buildup but the surface of the wound is actually doing quite well. She is going require some sharp debridement to clearway some of the necrotic debris. 10-19-2021 upon evaluation today patient appears to be doing better in regard to her wound which is actually measuring little bit smaller today. Fortunately there does not appear to be any signs of significant infection she does have quite a bit of callus we will get have to work on that today. Otherwise I think we are headed in the appropriate direction. 11-09-2021 upon evaluation today patient appears to be doing well in regard to her wound from the standpoint of infection and actually appears to be doing better she actually just completed the doxycycline. With that being said I am hopeful that this took care of the osteomyelitis although that something that we will definitely keep a close eye on. With that being said I do believe that she is still having tremendous amount of callus buildup which I believe in turn is a result of her having issues here with Friction and callus buildup. With that being said I  do believe that the patient would benefit here from a total contact cast I discussed that with her today. I do want to get more aggressive to get this closed as I think that if we do not then she is more likely to have a long standing issue here going forward. 11-17-2021 upon evaluation today patient  appears to be doing well although her wound is still open. I do think that she is ready to be placed in a total contact cast. She actually has come back on Thursday to have this changed out. Fortunately and overall I do not see any signs of worsening and I think that we should be able to get this on without any complication or problem.. 11-19-2021 upon evaluation today patient appears to be doing well currently in regard to her wound. She has been tolerating the dressing changes without complication and overall I am extremely pleased with where we stand today. I do not see any evidence of active infection locally or systemically at this time which is great news. I think the total contact cast did an awesome job for her. Electronic Signature(s) Signed: 11/19/2021 5:25:15 PM By: [name] Entered By: Worthy Keeler on 11/19/2021 17:25:15 -------------------------------------------------------------------------------- Physical Exam Details Patient Name: Date of Service: [name]. 11/19/2021 9:45 A [name]: [id number] Patient Account Number: [id number] Date of Birth/Sex: Treating RN: [birth year] (46 y.o. [name] Primary Care Provider: [name] Other Clinician: [name] Referring Provider: Treating Provider/Extender: [name] in Treatment: [address] [place] Jerilynn Mages ([id number]) [id number] Page 3 of 8 Well-nourished and well-hydrated in no acute distress. Respiratory normal breathing without difficulty. Psychiatric this patient is able to make decisions and demonstrates good insight into disease process. Alert and Oriented x 3. pleasant and cooperative. Notes Upon inspection patient's wound bed actually showed signs of good granulation and epithelization at this point. Fortunately I do not see any evidence of infection which is great news and overall I do believe that we are  headed in the right direction. The patient is very pleased as well with where things stand today. Electronic Signature(s) Signed: 11/19/2021 5:25:45 PM By: [name] Entered By: Worthy Keeler on 11/19/2021 17:25:45 -------------------------------------------------------------------------------- Physician Orders Details Patient Name: Date of Service: [name], [name]. 11/19/2021 9:45 A [name]: [id number] Patient Account Number: [id number] Date of Birth/Sex: Treating RN: 09-Mar-1975 (46 y.o. 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 Nurse Visit as needed Bathing/ Shower/ Hygiene May shower with wound dressing protected with water repellent cover or cast protector. No tub bath. Anesthetic (Use 'Patient Medications' Section for Anesthetic Order Entry) Lidocaine applied to wound bed Edema Control - Lymphedema / Segmental Compressive Device / Other Elevate, Exercise Daily and A void Standing for Long Periods of Time. Elevate legs to the level of the heart and pump ankles as often as possible Elevate leg(s) parallel to the floor when sitting. Off-Loading Total Contact Cast to Right Lower Extremity - TCC #2 applied Medications-Please add to medication list. Wound #  2 Posterior [name] Take one 566m Tylenol (A cetaminophen) and one 2047mMotrin (Ibuprofen) every 6 hours for pain. Do not take ibuprofen if you are on blood thinners or have  stomach ulcers. - take every 4 hours while awake Wound Treatment [name], [name] ([id number] Page 4 of 8 Wound #2 - [name] Wound Laterality: Posterior Cleanser: Byram Ancillary Kit - 15 Day Supply (Generic) 3 x Per Day/30 Days Discharge Instructions: Use supplies as instructed; Kit contains: (15) Saline Bullets; (15) 3x3 Gauze; 15 pr Gloves Cleanser: Soap and Water 3 x Per Day/30 Days Discharge Instructions: Gently cleanse wound with antibacterial soap, rinse and pat dry prior to dressing wounds Topical: Gentamicin 3 x Per Day/30 Days Discharge Instructions: Apply as directed by provider. Prim Dressing: Prisma 4.34 (in) 3 x Per Day/30 Days ary Discharge Instructions: Moisten w/normal saline or sterile water; Cover wound as directed. Do not remove from wound bed. Secondary Dressing: Zetuvit Plus 4x4 (in/in) 3 x Per Day/30 Days Electronic Signature(s) Signed: 11/19/2021 5:42:41 PM By: StWorthy KeelerA-C Signed: 11/24/2021 5:15:48 PM By: VeMassie Kluverntered By: VeMassie Kluvern 11/19/2021 10:40:38 -------------------------------------------------------------------------------- Problem List Details Patient Name: Date of Service: NETheodoro ClockMAHarrison Mons11/02/2021 9:45 A [name]: [id number] Account Number: [id number] of Birth/Sex: Treating RN: 02/22/29/19774810.o. 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 08/20/2021 No Yes L84  Corns and callosities 08/20/2021 No Yes Inactive Problems Resolved Problems [name], [name] ([id number] Page 5 of 8 Electronic Signature(s) Signed: 11/19/2021 10:12:12 AM By: StWorthy KeelerA-C Entered By: StWorthy Keelern 11/19/2021 10:12:12 -------------------------------------------------------------------------------- Progress Note Details Patient Name: Date of Service: NETheodoro ClockMAHarrison Mons11/02/2021 9:45 A [name]: [id number] Account Number: [id number] of Birth/Sex: Treating RN: 2/02-14-19774861.o. F)Kelly Shoresrimary Care Provider: SoTommi Rumpsther Clinician: VeMassie Kluvereferring Provider: Treating Provider/Extender: StCecelia Byarsn Treatment: 13 Subjective Chief Complaint Information obtained from Patient Right [name] ulcer History of Present Illness (HPI) The following HPI elements were documented for the patient's wound: Location: right first metatarsal head plantar aspect Quality: Patient reports experiencing a dull pain to affected area(s). Severity: Patient states wound are getting worse. Duration: Patient has had the wound for > 2 months prior to seeking treatment at the wound center Timing: Pain in wound is Intermittent (comes and goes Context: The wound appeared gradually over time Modifying Factors: Other treatment(s) tried include:pain clinic for her narcotics Associated Signs and Symptoms: Patient reports having difficulty standing for long periods. [age]ear old [place]. DaHolland Fallingho saw her recently on 05/15/2015 for chronic pain related to Charcot MaLelan Ponsooth disease and subsequent problems with her feet with a history of bilateral great toe amputations for osteomyelitis. Her notes were reviewed and there was extensive history of opioid [facility] and [facility] from where she has been terminated. X-ray of the  right [name] [name] was done on 03/13/2015 and was suggestive of active infection and possible osteomyelitis. her past surgical history  includes amputation of the left big toe in 2011 and amputation of the right big toe in 2015. She was sitting the wound clinic at [facility] and we will try and obtain these notes. She is a smoker and smokes about 15 cigarettes a day. 06/12/2015 - x-ray of the right [name] -- IMPRESSION:1. Soft tissue swelling of the plantar surface of the [name]. No underlying acute abnormality. 2. Amputation deformity again of the right great toe. Old healed bony deformity noted the right second metatarsal. No acute bony abnormality identified. We have not received any reports from the [facility] hospital yet. She continues to smoke at least 10 cigarettes a day 06/19/2015 -- she has increasing pain in the right [name] and also has had some low-grade fever. Her vascular test is scheduled for tomorrow and her MRIs not till next week. Readmission: 08-20-2021 upon evaluation today patient presents for initial inspection here in our clinic although she has been seen that was back in 2017. At that time she was having a issue with her right [name]. With that being said upon inspection today she is actually having issues with the right [name] as well on the medial aspect where she has some breakdown due to what I am assuming is pressure to the [name] location. 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 I did review her culture report which showed evidence of bacteria including hemolytic Streptococcus group A as well as Staphylococcus aureus. The patient is currently on doxycycline and tells me that this is getting much better. T opical gentamicin is also being utilized. Also did review her arterial studies which show that she has on the  right and ABI of 1.15 which is triphasic and appears to be excellent there is no significant peripheral artery disease noted the left is also. With ABI of 1.11 and a good report in that regard as well. Lastly I did also review her MRI of the right forefoot which was performed on 07-06-2021. This shows that she does have mild subcortical marrow edema in the stump of the first metatarsal concerning for osteomyelitis. There is also a malunited fracture of the second metatarsal. The patient has bone marrow edema in the base of the second third fourth and fifth metatarsals concerning for stress reaction obviously this infection is going to be an issue for [name] here as well she needs to be treated appropriately and I do believe that the doxycycline is a good option right now although I may need to #1 extend this and #2 plan for even altering or adding to the treatment regimen depending on how things progress. Patient voiced understanding.Marland Kitchen 09-01-2021 upon evaluation today patient appears to be doing excellent in regard to her wound. She is actually showing signs of excellent improvement which is great news. Fortunately I do not see any signs of active infection locally or systemically at this point which is great news and overall I am extremely pleased with where things stand today. I do not see any evidence of active infection locally or systemically. 09-07-2021 upon evaluation today patient appears to be doing well with regard to her [name] ulcer. This is actually showing signs of improvement which is great news. Fortunately there does not appear to be any signs of active infection locally or systemically at this time which is as well. 09-22-2021 upon evaluation  today patient appears to be doing well currently in regard to her wound. This is measuring smaller and looking better were slowly [name], [name] ([id number]) [id number] Page 6 of 8 making progress here. Fortunately there does  not appear to be any evidence of active infection locally or systemically at this time. Upon inspection patient's wound bed showed evidence of good granulation and epithelization at this point. 10-05-2021 upon evaluation today patient's wound shows a lot of callus buildup but the surface of the wound is actually doing quite well. She is going require some sharp debridement to clearway some of the necrotic debris. 10-19-2021 upon evaluation today patient appears to be doing better in regard to her wound which is actually measuring little bit smaller today. Fortunately there does not appear to be any signs of significant infection she does have quite a bit of callus we will get have to work on that today. Otherwise I think we are headed in the appropriate direction. 11-09-2021 upon evaluation today patient appears to be doing well in regard to her wound from the standpoint of infection and actually appears to be doing better she actually just completed the doxycycline. With that being said I am hopeful that this took care of the osteomyelitis although that something that we will definitely keep a close eye on. With that being said I do believe that she is still having tremendous amount of callus buildup which I believe in turn is a result of her having issues here with Friction and callus buildup. With that being said I do believe that the patient would benefit here from a total contact cast I discussed that with her today. I do want to get more aggressive to get this closed as I think that if we do not then she is more likely to have a long standing issue here going forward. 11-17-2021 upon evaluation today patient appears to be doing well although her wound is still open. I do think that she is ready to be placed in a total contact cast. She actually has come back on Thursday to have this changed out. Fortunately and overall I do not see any signs of worsening and I think that we should be able to get  this on without any complication or problem.. 11-19-2021 upon evaluation today patient appears to be doing well currently in regard to her wound. She has been tolerating the dressing changes without complication and overall I am extremely pleased with where we stand today. I do not see any evidence of active infection locally or systemically at this time which is great news. I think the total contact cast did an awesome job for her. Objective Constitutional Well-nourished and well-hydrated in no acute distress. Vitals Time Taken: 10:05 AM, Height: 67 in, Weight: 153 lbs, BMI: 24, Temperature: 98.2 F, Pulse: 80 bpm, Respiratory Rate: 16 breaths/min, Blood Pressure: 98/64 mmHg. Respiratory normal breathing without difficulty. Psychiatric this patient is able to make decisions and demonstrates good insight into disease process. Alert and Oriented x 3. pleasant and cooperative. General Notes: Upon inspection patient's wound bed actually showed signs of good granulation and epithelization at this point. Fortunately I do not see any evidence of infection which is great news and overall I do believe that we are headed in the right direction. The patient is very pleased as well with where things stand today. Integumentary (Hair, Skin) Wound #2 status is Open. Original cause of wound was Trauma. The date acquired was: 06/18/2021. The wound has been  in treatment 13 weeks. The wound is located on the Posterior [name]. The wound measures 0.2cm length x 0.3cm width x 0.1cm depth; 0.047cm^2 area and 0.005cm^3 volume. There is Fat Layer (Subcutaneous Tissue) exposed. There is a medium amount of serosanguineous drainage noted. There is large (67-100%) pink granulation within the wound bed. There is a small (1-33%) amount of necrotic tissue within the wound bed including Adherent Slough. Assessment Active Problems ICD-10 Other chronic osteomyelitis, right ankle and [name] Non-pressure chronic ulcer of other part  of right [name] with fat layer exposed Muscular dystrophy, unspecified Hereditary motor and sensory neuropathy Corns and callosities Procedures Wound #2 Pre-procedure diagnosis of Wound #2 is an Abscess located on the Posterior [name] . There was a T Contact Cast Procedure by [name], [name]. otal Post procedure Diagnosis Wound #2: Same as Pre-Procedure [name], [name] ([id number]) [id number] Page 7 of 8 Plan Follow-up Appointments: Return Appointment in 1 week. Nurse Visit as needed Bathing/ Shower/ Hygiene: May shower with wound dressing protected with water repellent cover or cast protector. No tub bath. Anesthetic (Use 'Patient Medications' Section for Anesthetic Order Entry): Lidocaine applied to wound bed Edema Control - Lymphedema / Segmental Compressive Device / Other: Elevate, Exercise Daily and Avoid Standing for Long Periods of Time. Elevate legs to the level of the heart and pump ankles as often as possible Elevate leg(s) parallel to the floor when sitting. Off-Loading: T Contact Cast to Right Lower Extremity - TCC #2 applied otal Medications-Please add to medication list.: Wound #2 Posterior [name]: T one 566m Tylenol (Acetaminophen) and one 2032mMotrin (Ibuprofen) every 6 hours for pain. Do not take ibuprofen if you are on blood thinners or ake have stomach ulcers. - take every 4 hours while awake WOUND #2: - [name] Wound Laterality: Posterior Cleanser: Byram Ancillary Kit - 15 Day Supply (Generic) 3 x Per Day/30 Days Discharge Instructions: Use supplies as instructed; Kit contains: (15) Saline Bullets; (15) 3x3 Gauze; 15 pr Gloves Cleanser: Soap and Water 3 x Per Day/30 Days Discharge Instructions: Gently cleanse wound with antibacterial soap, rinse and pat dry prior to dressing wounds Topical: Gentamicin 3 x Per Day/30 Days Discharge Instructions: Apply as directed by provider. Prim Dressing: Prisma 4.34 (in) 3 x Per Day/30  Days ary Discharge Instructions: Moisten w/normal saline or sterile water; Cover wound as directed. Do not remove from wound bed. Secondary Dressing: Zetuvit Plus 4x4 (in/in) 3 x Per Day/30 Days 1. I did go ahead and reapply the total contact casting which I feel like is doing a good job for her. 2. I am also can recommend that we continue with the dressing as before. We are using the Prisma which I think is doing a good job. We will see patient back for reevaluation in 1 week here in the clinic. If anything worsens or changes patient will contact our office for additional recommendations. Electronic Signature(s) Signed: 11/19/2021 5:26:03 PM By: StWorthy KeelerA-C Entered By: StWorthy Keelern 11/19/2021 17:26:03 -------------------------------------------------------------------------------- Total Contact Cast Details Patient Name: Date of Service: Kelly Penna11/02/2021 9:45 A [name]: [id number] Account Number: [id number] of Birth/Sex: Treating RN: 02/1975-06-184819.o. F)Kelly Shoresrimary Care Provider: SoTommi Rumpsther Clinician: VeMassie Kluvereferring Provider: Treating Provider/Extender: StCecelia Byarsn Treatment: 13 T Contact Cast Applied for Wound Assessment: otal Wound #2 Posterior [name] Performed By: Physician StTommie Sams [name] Post Procedure Diagnosis Same as Pre-procedure Electronic Signature(s)  Signed: 11/19/2021 5:42:41 PM By: [name] Signed: 11/24/2021 5:15:48 PM By: [name] Entered By: [name] on 11/19/2021 10:37:56 [name], [name] ([id number]) [id number] Page 8 of 8 -------------------------------------------------------------------------------- SuperBill Details Patient Name: Date of Service: [name], [name] 11/19/2021 Medical Record Number: [id number] Patient Account Number: [id number] Date of Birth/Sex: Treating RN: [birth year] (47  y.o. 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 Corns and callosities Facility Procedures : CPT4 Code: [id number] Description: [phone] - APPLY TOTAL CONTACT LEG CAST ICD-10 Diagnosis Description 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

## 2021-11-21 ENCOUNTER — Telehealth: Payer: Self-pay | Admitting: Family

## 2021-11-21 DIAGNOSIS — F419 Anxiety disorder, unspecified: Secondary | ICD-10-CM

## 2021-11-23 ENCOUNTER — Ambulatory Visit (INDEPENDENT_AMBULATORY_CARE_PROVIDER_SITE_OTHER): Payer: Medicare PPO | Admitting: Psychology

## 2021-11-23 DIAGNOSIS — F331 Major depressive disorder, recurrent, moderate: Secondary | ICD-10-CM

## 2021-11-23 NOTE — Progress Notes (Signed)
[name] is a 46 y.o. female patient .  Diagnosis 296.30 (Major depressive affective disorder, recurrent episode, unspecified) [n/a]  Symptoms Depressed or irritable mood. (Status: maintained) -- No Description Entered  Diminished interest in or enjoyment of activities. (Status: maintained) -- No Description Entered  Feelings of hopelessness, worthlessness, or inappropriate guilt. (Status: maintained) -- No Description Entered  Lack of energy. (Status: maintained) -- No Description Entered  Unresolved grief issues. (Status: maintained) -- No Description Entered  Medication Status compliance  Safety none  If Suicidal or Homicidal State Action Taken: unspecified  Current Risk: low Medications Hydroxyzine (Dosage: unknown)  Remiron (Dosage: unknown)  Sertraline (Dosage: unknown)  Objectives Related Problem: Appropriately grieve the loss in order to normalize mood and to return to previously adaptive level of functioning. Description: Increasingly verbalize hopeful and positive statements regarding self, others, and the future. Target Date: 2022-01-13 Frequency: Daily Modality: individual Progress: 80%  Related Problem: Appropriately grieve the loss in order to normalize mood and to return to previously adaptive level of functioning. Description: Learn and implement conflict resolution skills to resolve interpersonal problems. Target Date: 2022-01-13 Frequency: Daily Modality: individual Progress: 80%  Related Problem: Appropriately grieve the loss in order to normalize mood and to return to previously adaptive level of functioning. Description: Learn and implement problem-solving and decision-making skills. Target Date: 2022-01-13 Frequency: Daily Modality: individual Progress: 80%  Related Problem: Appropriately grieve the loss in order to normalize mood and to return to previously adaptive level of functioning. Description:  Describe current and past experiences with depression including their impact on functioning and attempts to resolve it. Target Date: 2021-07-14 Frequency: Daily Modality: individual Progress: 100%-completed  Related Problem: Complete the process of letting go of the lost significant other. Description: Report decreased time spent each day focusing on the loss. Target Date: 2022-01-13 Frequency: Daily Modality: individual Progress: 80%  Related Problem: Complete the process of letting go of the lost significant other. Description: Decrease unrealistic thoughts, statements, and feelings of being responsible for the loss. Target Date: 2021-07-14 Frequency: Daily Modality: individual Progress: 100%-completed  Related Problem: Complete the process of letting go of the lost significant other. Description: Verbalize and resolve feelings of anger or guilt focused on self or deceased loved one that interfere with the grieving process. Target Date: 2021-07-14 Frequency: Daily Modality: individual Progress: 100%-completed  Related Problem: Complete the process of letting go of the lost significant other. Description: Begin verbalizing feelings associated with the loss. Target Date: 2022-01-13 Frequency: Daily Modality: individual Progress: 80%  Related Problem: Complete the process of letting go of the lost significant other. Description: Identify what stages of grief have been experienced in the continuum of the grieving process. Target Date: 2021-07-14 Frequency: Daily Modality: individual Progress: 100%-completed  Related Problem: Complete the process of letting go of the lost significant other. Description: Participate in a therapy that addresses issues beyond grief that have arisen as a result of the loss. Target Date: 2022-01-13 Frequency: Daily Modality: individual Progress: 80%  Related Problem: Complete the process of letting go of the lost significant  other. Description: Tell in detail the story of the current loss that is triggering symptoms. Target Date: 2021-07-14 Frequency: Daily Modality: individual Progress: 100%-completed  Client Response full compliance  Service Location Location, [address] [place], [place] [address]  Service Code cpt [phone]  Validate/empathize  Related past to present  Behavioral activation plan  Facilitate problem solving  Identify automatic thoughts  Rationally challenge thoughts or beliefs/cognitive restructuring  Identify/label emotions  Emotion regulation skills  Psychiatrist  Self care activities  Self-monitoring  Session Notes: Patient requests a video Webex session.. She is at home and I am at my home office.  Dx: Major Depression  Meds: Lexapro ('20mg'$ ), Busperone (7.5)  Goals: States that she is seeking counseling to learn to manage the grief reaction to losing her husband. She reports feeling tired all the time and that she has no energy or motivation. Needs to develop coping strategies. Therapy to focus on reducing despair, sadness, hopelessness and helplessness. 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 Will explore those early traumas/losses and current triggers. Goal dates: 04-2021 Revised Goals:12-23 Patient states that she stood up to her parents about the camera her brother put in their house. She also talked about how she was feeling treated by them. They both responded positively to her. She was very gratified by this change and her ability to assert herself. She talked about waves of grief and feeling that "something is missing". She has an overwhelming sense of wanting to talk with Ulice Dash. She is now using a journal (again) to communicate her feelings. She finds this helpful, especially when distressed. Talked about progress she has made in the 5 years since [place] death. She feels in much better control and about to make  good decisions. Would like to maintain a cadence of 2 sessions/month for now.                                     [name], PhD Time: 7:40a-8:25a 45 minutes

## 2021-11-25 NOTE — Progress Notes (Signed)
SEREN, CHALOUX ([id number]) [id number] Page 1 of 8 Visit Report for 11/19/2021 Arrival Information Details Patient Name: Date of Service: [name], [name] 11/19/2021 9:45 A [name]: [id number] Patient Account Number: [id number] Date of Birth/Sex: Treating RN: 1975/03/30 (46 y.o. Charolette Forward, Kim Primary Care Arvind Mexicano: [name] Other Clinician: [name] Referring Hansika Leaming: Treating Khandi Kernes/Extender: [name] in Treatment: 13 Visit Information History Since Last Visit All ordered tests and consults were completed: No Patient Arrived: Ambulatory Added or deleted any medications: No Arrival Time: 09:57 Any new allergies or adverse reactions: No Transfer Assistance: None Had a fall or experienced change in No Patient Requires Transmission-Based Precautions: No activities of daily living that may affect Patient Has Alerts: Yes risk of falls: Patient Alerts: ABI R 1.15 02/04/21 Hospitalized since last visit: No ABI L 1.11 02/04/21 Pain Present Now: No Electronic Signature(s) Signed: 11/24/2021 5:15:48 PM By: [name] Entered By: [name] on 11/19/2021 09:57:15 -------------------------------------------------------------------------------- Clinic Level of Care Assessment Details Patient Name: Date of Service: [name]. 11/19/2021 9:45 A [name]: [id number] Patient Account Number: [id number] Date of Birth/Sex: Treating RN: October 01, 1975 (46 y.o. [name] Primary Care Iana Buzan: [name] Other Clinician: [name] Referring Anela Bensman: Treating Sydny Schnitzler/Extender: [name] in Treatment: 13 Clinic Level of Care Assessment Items TOOL 1 Quantity Score _0  - 0 Use when EandM and Procedure is performed on INITIAL visit ASSESSMENTS - Nursing Assessment / Reassessment _1  - 0 General Physical Exam (combine w/ comprehensive  assessment (listed just below) when performed on new pt. evals) _2  - 0 Comprehensive Assessment (HX, ROS, Risk Assessments, Wounds Hx, etc.) ASSESSMENTS - Wound and Skin Assessment / Reassessment _3  - 0 Dermatologic / Skin Assessment (not related to wound area) ASSESSMENTS - Ostomy and/or Continence Assessment and Care [name], [name] ([id number]) [id number] Page 2 of 8 _4  - 0 Incontinence Assessment and Management _5  - 0 Ostomy Care Assessment and Management (repouching, etc.) PROCESS - Coordination of Care _6  - 0 Simple Patient / Family Education for ongoing care _7  - 0 Complex (extensive) Patient / Family Education for ongoing care _8  - 0 Staff obtains Programmer, systems, Records, T Results / Process Orders est _9  - 0 Staff telephones HHA, Nursing Homes / Clarify orders / etc _10  - 0 Routine Transfer to another Facility (non-emergent condition) _11  - 0 Routine Hospital Admission (non-emergent condition) _12  - 0 New Admissions / Biomedical engineer / Ordering NPWT Apligraf, etc. , _13  - 0 Emergency Hospital Admission (emergent condition) PROCESS - Special Needs _14  - 0 Pediatric / Minor Patient Management _15  - 0 Isolation Patient Management _16  - 0 Hearing / Language / Visual special needs _17  - 0 Assessment of Community assistance (transportation, D/C planning, etc.) _18  - 0 Additional assistance / Altered mentation _19  - 0 Support Surface(s) Assessment (bed, cushion, seat, etc.) INTERVENTIONS - Miscellaneous _20  - 0 External ear exam _21  - 0 Patient Transfer (multiple staff / Civil Service fast streamer / Similar devices) _22  - 0 Simple Staple / Suture removal (25 or less) _23  - 0 Complex Staple / Suture removal (26 or more) _24  - 0 Hypo/Hyperglycemic Management (do not check if billed separately) _25  - 0 Ankle / Brachial Index (ABI) - do not check if billed separately Has the patient been seen at the hospital within the last three years: Yes Total Score: 0 Level  Of Care: ____ Electronic Signature(s) Signed: 11/24/2021 5:15:48 PM By: [name] Entered By: [name]  on 11/19/2021 10:40:44 -------------------------------------------------------------------------------- Encounter Discharge Information Details Patient Name: Date of Service: [name]. 11/19/2021 9:45 A [name]: [id number] Patient Account Number: [id number] Date of Birth/Sex: Treating RN: Nov 15, 1975 (46 y.o. [name] Primary Care Forest Pruden: [name] Other Clinician: [name] Referring Jossilyn Benda: Treating Maxine Fredman/Extender: [name] in Treatment: 13 Encounter Discharge Information Items Discharge Condition: Stable [name], [name] ([id number]) [id number] Page 3 of 8 Ambulatory Status: Ambulatory Discharge Destination: Home Transportation: Private Auto Accompanied By: self Schedule Follow-up Appointment: Yes Clinical Summary of Care: Electronic Signature(s) Signed: 11/24/2021 5:15:48 PM By: [name] Entered By: [name] on 11/19/2021 12:50:20 -------------------------------------------------------------------------------- Lower Extremity Assessment Details Patient Name: Date of Service: [name]. 11/19/2021 9:45 A [name]: [id number] Patient Account Number: [id number] Date of Birth/Sex: Treating RN: Oct 23, 1975 (46 y.o. [name] Primary Care Damira Kem: [name] Other Clinician: [name] Referring Zalaya Astarita: Treating Alaysha Jefcoat/Extender: [name] in Treatment: 13 Electronic Signature(s) Signed: 11/19/2021 2:35:39 PM By: Gretta Cool BSN, RN, CWS, Kim RN, BSN Signed: 11/24/2021 5:15:48 PM By: [name] Entered By: [name] on 11/19/2021 10:13:07 -------------------------------------------------------------------------------- Multi Wound Chart Details Patient Name: Date of Service: [name],  [name] 11/19/2021 9:45 A [name]: [id number] Patient Account Number: [id number] Date of Birth/Sex: Treating RN: 1975/10/17 (46 y.o. [name] Primary Care Arryana Tolleson: [name] Other Clinician: [name] Referring Vernesha Talbot: Treating Zakkary Thibault/Extender: [name] in Treatment: 13 Vital Signs Height(in): 67 Pulse(bpm): 80 Weight(lbs): 153 Blood Pressure(mmHg): 98/64 Body Mass Index(BMI): 24 Temperature(F): 98.2 Respiratory Rate(breaths/min): 16 [2:Photos:] [N/A:N/A] Posterior Foot N/A N/A Wound Location: Trauma N/A N/A Wounding Event: Abscess N/A N/A Primary Etiology: Anemia, Asthma, History of pressure N/A N/A Comorbid History: wounds, Osteomyelitis, Neuropathy 06/18/2021 N/A N/A Date Acquired: 13 N/A N/A Weeks of Treatment: Open N/A N/A Wound Status: No N/A N/A Wound Recurrence: 0.2x0.3x0.1 N/A N/A Measurements L x W x D (cm) 0.047 N/A N/A A (cm) : rea 0.005 N/A N/A Volume (cm) : 93.40% N/A N/A % Reduction in Area: 98.20% N/A N/A % Reduction in Volume: Full Thickness Without Exposed N/A N/A Classification: Support Structures Medium N/A N/A Exudate Amount: Serosanguineous N/A N/A Exudate Type: red, brown N/A N/A Exudate Color: Large (67-100%) N/A N/A Granulation Amount: Pink N/A N/A Granulation Quality: Small (1-33%) N/A N/A Necrotic Amount: Fat Layer (Subcutaneous Tissue): Yes N/A N/A Exposed Structures: Fascia: No Tendon: No Muscle: No Joint: No Bone: No None N/A N/A Epithelialization: Treatment Notes Electronic Signature(s) Signed: 11/24/2021 5:15:48 PM By: [name] Entered By: [name] on 11/19/2021 10:13:55 -------------------------------------------------------------------------------- Jud Details Patient Name: Date of Service: [name], [name] 11/19/2021 9:45 A [name]: [id number] Patient Account Number: [id number] Date of  Birth/Sex: Treating RN: 06/01/1975 (46 y.o. [name] Primary Care Cranston Koors: [name] Other Clinician: [name] Referring Shion Bluestein: Treating Teghan Philbin/Extender: [name] in Treatment: 13 Active Inactive Wound/Skin Impairment Nursing Diagnoses: Knowledge deficit related to ulceration/compromised skin integrity Goals: Patient/caregiver will verbalize understanding of skin care regimen Date Initiated: 08/20/2021 Target Resolution Date: 09/20/2021 Goal Status: Active AUBRIAUNA, RINER ([id number]) [id number] Page 5 of 8 Ulcer/skin breakdown will have a volume reduction of 30% by week 4 Date Initiated: 08/20/2021 Target Resolution Date: 09/20/2021 Goal Status: Active Ulcer/skin breakdown will have a volume reduction of 50% by week 8 Date Initiated: 08/20/2021 Target Resolution Date: 10/20/2021 Goal Status: Active Ulcer/skin breakdown will  have a volume reduction of 80% by week 12 Date Initiated: 08/20/2021 Target Resolution Date: 11/20/2021 Goal Status: Active Ulcer/skin breakdown will heal within 14 weeks Date Initiated: 08/20/2021 Target Resolution Date: 12/20/2021 Goal Status: Active Interventions: Assess patient/caregiver ability to obtain necessary supplies Assess patient/caregiver ability to perform ulcer/skin care regimen upon admission and as needed Assess ulceration(s) every visit Notes: Electronic Signature(s) Signed: 11/19/2021 2:35:39 PM By: Gretta Cool, BSN, RN, CWS, Kim RN, BSN Signed: 11/24/2021 5:15:48 PM By: [name] Entered By: [name] on 11/19/2021 10:13:46 -------------------------------------------------------------------------------- Pain Assessment Details Patient Name: Date of Service: [name], [name] 11/19/2021 9:45 A [name]: [id number] Patient Account Number: [id number] Date of Birth/Sex: Treating RN: 27-Oct-1975 (46 y.o. [name] Primary Care Kit Brubacher: [name] Other Clinician: [name] Referring Merl Bommarito: Treating Charle Mclaurin/Extender: [name] in Treatment: 13 Active Problems Location of Pain Severity and Description of Pain Patient Has Paino No Site Locations Pain Management and Medication Current Pain Management: [name], [name] ([id number]) [id number] Page 6 of 8 Electronic Signature(s) Signed: 11/19/2021 2:35:39 PM By: Gretta Cool, BSN, RN, CWS, Kim RN, BSN Signed: 11/24/2021 5:15:48 PM By: [name] Entered By: [name] on 11/19/2021 10:08:50 -------------------------------------------------------------------------------- Patient/Caregiver Education Details Patient Name: Date of Service: [name] 11/2/2023andnbsp9:45 A [name]: [id number] Patient Account Number: [id number] Date of Birth/Gender: Treating RN: [birth year] (46 y.o. [name] Primary Care Physician: [name] Other Clinician: [name] Referring Physician: Treating Physician/Extender: [name] in Treatment: 13 Education Assessment Education Provided To: Patient Education Topics Provided Wound/Skin Impairment: Handouts: Other: continue wound care as directed Methods: Explain/Verbal Responses: State content correctly Electronic Signature(s) Signed: 11/24/2021 5:15:48 PM By: [name] Entered By: [name] on 11/19/2021 10:41:25 -------------------------------------------------------------------------------- Wound Assessment Details Patient Name: Date of Service: [name], [name] 11/19/2021 9:45 A [name]: [id number] Patient Account Number: [id number] Date of Birth/Sex: Treating RN: 1975/11/22 (46 y.o. [name] Primary Care Mitchelle Sultan: [name] Other Clinician: [name] Referring Zamoria Boss: Treating Chasten Blaze/Extender: [name] in Treatment: 13 Wound  Status Wound Number: 2 Primary Abscess Etiology: Wound Location: Posterior Foot Wound Status: Open Wounding Event: Trauma Comorbid Anemia, Asthma, History of pressure wounds, Osteomyelitis, Date Acquired: 06/18/2021 History: Neuropathy Weeks Of Treatment: 13 Clustered Wound: No [name], [name] ([id number]) [id number] Page 7 of 8 Photos Wound Measurements Length: (cm) 0.2 Width: (cm) 0.3 Depth: (cm) 0.1 Area: (cm) 0.047 Volume: (cm) 0.005 % Reduction in Area: 93.4% % Reduction in Volume: 98.2% Epithelialization: None Wound Description Classification: Full Thickness Without Exposed Support Structures Exudate Amount: Medium Exudate Type: Serosanguineous Exudate Color: red, brown Foul Odor After Cleansing: No Slough/Fibrino No Wound Bed Granulation Amount: Large (67-100%) Exposed Structure Granulation Quality: Pink Fascia Exposed: No Necrotic Amount: Small (1-33%) Fat Layer (Subcutaneous Tissue) Exposed: Yes Necrotic Quality: Adherent Slough Tendon Exposed: No Muscle Exposed: No Joint Exposed: No Bone Exposed: No Treatment Notes Wound #2 (Foot) Wound Laterality: Posterior Cleanser Byram Ancillary Kit - 15 Day Supply Discharge Instruction: Use supplies as instructed; Kit contains: (15) Saline Bullets; (15) 3x3 Gauze; 15 pr Gloves Soap and Water Discharge Instruction: Gently cleanse wound with antibacterial soap, rinse and pat dry prior to dressing wounds Peri-Wound Care Topical Gentamicin Discharge Instruction: Apply as directed by Kamaree Wheatley. Primary Dressing Prisma 4.34 (in) Discharge Instruction: Moisten w/normal saline or sterile water; Cover wound as directed. Do not remove from wound bed. Secondary Dressing Zetuvit Plus 4x4 (in/in)  Secured With Compression Wrap Compression Stockings Environmental education officer) Signed: 11/19/2021 2:35:39 PM By: Gretta Cool, BSN, RN, CWS, Kim RN, BSN Signed: 11/24/2021 5:15:48 PM By: [name] Entered By: [name] on 11/19/2021 10:12:58 [name], [name] ([id number]) [id number] Page 8 of 8 -------------------------------------------------------------------------------- Vitals Details Patient Name: Date of Service: [name]. 11/19/2021 9:45 A [name]: [id number] Patient Account Number: [id number] Date of Birth/Sex: Treating RN: [birth year] (46 y.o. Charolette Forward, Kim Primary Care Kyndra Condron: [name] Other Clinician: [name] Referring Jeren Dufrane: Treating Esmeralda Malay/Extender: [name] in Treatment: 13 Vital Signs Time Taken: 10:05 Temperature (F): 98.2 Height (in): 67 Pulse (bpm): 80 Weight (lbs): 153 Respiratory Rate (breaths/min): 16 Body Mass Index (BMI): 24 Blood Pressure (mmHg): 98/64 Reference Range: 80 - 120 mg / dl Electronic Signature(s) Signed: 11/24/2021 5:15:48 PM By: [name] Entered By: [name] on 11/19/2021 10:08:44

## 2021-11-26 ENCOUNTER — Encounter: Payer: Medicare PPO | Admitting: Internal Medicine

## 2021-11-26 DIAGNOSIS — L97512 Non-pressure chronic ulcer of other part of right foot with fat layer exposed: Secondary | ICD-10-CM | POA: Diagnosis not present

## 2021-11-26 DIAGNOSIS — Z89411 Acquired absence of right great toe: Secondary | ICD-10-CM | POA: Diagnosis not present

## 2021-11-26 DIAGNOSIS — M86671 Other chronic osteomyelitis, right ankle and foot: Secondary | ICD-10-CM | POA: Diagnosis not present

## 2021-11-26 DIAGNOSIS — G600 Hereditary motor and sensory neuropathy: Secondary | ICD-10-CM | POA: Diagnosis not present

## 2021-11-26 DIAGNOSIS — L02415 Cutaneous abscess of right lower limb: Secondary | ICD-10-CM | POA: Diagnosis not present

## 2021-11-26 DIAGNOSIS — Z89412 Acquired absence of left great toe: Secondary | ICD-10-CM | POA: Diagnosis not present

## 2021-11-26 DIAGNOSIS — G7100 Muscular dystrophy, unspecified: Secondary | ICD-10-CM | POA: Diagnosis not present

## 2021-11-26 DIAGNOSIS — L84 Corns and callosities: Secondary | ICD-10-CM | POA: Diagnosis not present

## 2021-11-26 NOTE — Progress Notes (Signed)
Kelly, [name] ([id number]) [id number] Page 1 of 8 Visit Report for 11/26/2021 HPI Details Patient Name: Date of Service: [name]. 11/26/2021 9:45 A M Medical Record Number: [id number] Patient Account Number: [id number] Date of Birth/Sex: Treating RN: September 07, 1975 (46 y.o. [name] Primary Care Provider: [name] Other Clinician: [name] Referring Provider: Treating Provider/Extender: RO BSO N, Blaine EL Carolan Shiver, Randel Pigg in Treatment: 14 History of Present Illness Location: right first metatarsal head plantar aspect Quality: Patient reports experiencing a dull pain to affected area(s). Severity: Patient states wound are getting worse. Duration: Patient has had the wound for > 2 months prior to seeking treatment at the wound center Timing: Pain in wound is Intermittent (comes and goes Context: The wound appeared gradually over time Modifying Factors: Other treatment(s) tried include:pain clinic for her narcotics ssociated Signs and Symptoms: Patient reports having difficulty standing for long periods. A HPI Description: 46 year old patient sent to [name] by her PCP 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 Her notes were reviewed and there was extensive history of opioid treatment and management by the [facility] pain clinic from where she has been terminated. X-ray of the right foot foot was done on 03/13/2015 and was suggestive of active infection and possible osteomyelitis. her past surgical history includes amputation of the left big toe in 2011 and amputation of the right big toe in 2015. She was sitting the wound clinic at [facility] and we will try and obtain these notes. She is a smoker and smokes about 15 cigarettes a day. 06/12/2015 - x-ray of the right  foot -- IMPRESSION:1. Soft tissue swelling of the plantar surface of the foot. No underlying acute abnormality. 2. Amputation deformity again of the right great toe. Old healed bony deformity noted the right second metatarsal. No acute bony abnormality identified. We have not received any reports from the [facility] hospital yet. She continues to smoke at least 10 cigarettes a day 06/19/2015 -- she has increasing pain in the right foot and also has had some low-grade fever. Her vascular test is scheduled for tomorrow and her MRIs not till next week. Readmission: 08-20-2021 upon evaluation today patient presents for initial inspection here in our clinic although she has been seen that was back in 2017. At that time she was having a issue with her right foot. With that being said upon inspection today she is actually having issues with the right foot as well on the medial aspect where she has some breakdown due to what I am assuming is pressure to the foot location. 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 I did review her culture report which showed evidence of bacteria including hemolytic Streptococcus group A as well as Staphylococcus aureus. The patient is currently on doxycycline and tells me that this is getting much better. T opical gentamicin is also being utilized. Also did review her arterial studies which show that she has on the right and ABI of 1.15 which is triphasic and appears to be excellent there is no significant peripheral artery disease noted the left is also. With ABI of 1.11 and a good report in that regard as well.  Lastly I did also review her MRI of the right forefoot which was performed on 07-06-2021. This shows that she does have mild subcortical marrow edema in the stump of the first metatarsal concerning for  osteomyelitis. There is also a malunited fracture of the second metatarsal. The patient has bone marrow edema in the base of the second third fourth and fifth metatarsals concerning for stress reaction obviously this infection is going to be an issue for [name] here as well she needs to be treated appropriately and I do believe that the doxycycline is a good option right now although I may need to #1 extend this and #2 plan for even altering or adding to the treatment regimen depending on how things progress. Patient voiced understanding.Marland Kitchen 09-01-2021 upon evaluation today patient appears to be doing excellent in regard to her wound. She is actually showing signs of excellent improvement which is great news. Fortunately I do not see any signs of active infection locally or systemically at this point which is great news and overall I am extremely pleased with where things stand today. I do not see any evidence of active infection locally or systemically. 09-07-2021 upon evaluation today patient appears to be doing well with regard to her foot ulcer. This is actually showing signs of improvement which is great news. Fortunately there does not appear to be any signs of active infection locally or systemically at this time which is as well. 09-22-2021 upon evaluation today patient appears to be doing well currently in regard to her wound. This is measuring smaller and looking better were slowly making progress here. Fortunately there does not appear to be any evidence of active infection locally or systemically at this time. Upon inspection patient's wound bed showed evidence of good granulation and epithelization at this point. 10-05-2021 upon evaluation today patient's wound shows a lot of callus buildup but the surface of the wound is actually doing quite well. She is going require some sharp debridement to clearway some of the necrotic debris. 10-19-2021 upon evaluation today patient appears to be doing better  in regard to her wound which is actually measuring little bit smaller today. Fortunately there does not appear to be any signs of significant infection she does have quite a bit of callus we will get have to work on that today. Otherwise I think we are headed in the appropriate direction. HEAVENLY, CHRISTINE ([id number]) [id number] Page 2 of 8 11-09-2021 upon evaluation today patient appears to be doing well in regard to her wound from the standpoint of infection and actually appears to be doing better she actually just completed the doxycycline. With that being said I am hopeful that this took care of the osteomyelitis although that something that we will definitely keep a close eye on. With that being said I do believe that she is still having tremendous amount of callus buildup which I believe in turn is a result of her having issues here with Friction and callus buildup. With that being said I do believe that the patient would benefit here from a total contact cast I discussed that with her today. I do want to get more aggressive to get this closed as I think that if we do not then she is more likely to have a long standing issue here going forward. 11-17-2021 upon evaluation today patient appears to be doing well although her wound is still open. I do think that she is ready to be placed in a total contact cast.  She actually has come back on Thursday to have this changed out. Fortunately and overall I do not see any signs of worsening and I think that we should be able to get this on without any complication or problem.. 11-19-2021 upon evaluation today patient appears to be doing well currently in regard to her wound. She has been tolerating the dressing changes without complication and overall I am extremely pleased with where we stand today. I do not see any evidence of active infection locally or systemically at this time which is great news. I think the total contact  cast did an awesome job for her. 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 Only a very small area remains on the plantar foot. I had to use illumination to even see anything that looked open here. Much improved Electronic Signature(s) Signed: 11/26/2021 3:56:39 PM By: Linton Ham MD Entered By: Linton Ham on 11/26/2021 11:01:00 -------------------------------------------------------------------------------- Physical Exam Details Patient Name: Date of Service: [name], [name] 11/26/2021 9:45 A M Medical Record Number: [id number] Patient Account Number: [id number] Date of Birth/Sex: Treating RN: [birth year] (46 y.o. [name] Primary Care Provider: [name] Other Clinician: [name] Referring Provider: Treating Provider/Extender: RO BSO N, MICHA EL Carolan Shiver, Randel Pigg in Treatment: 14 Constitutional Sitting or standing Blood Pressure is within target range for patient.. Pulse regular and within target range for patient.Marland Kitchen Respirations regular, non-labored and within target range.. Temperature is normal and within the target range for the patient.Marland Kitchen appears in no distress. Notes Wound exam; the area questions on the right foot. Very small area of the vast majority of this is epithelialized. Under intense illumination I am able to see a small area that does not look completely epithelialized although looking back on the pictures this is come down remarkably in the last week or 2. There is no evidence of surrounding infection no purulence. No debridement was necessary Electronic Signature(s) Signed: 11/26/2021 3:56:39 PM By: Linton Ham MD Entered By: Linton Ham on 11/26/2021 11:02:22 -------------------------------------------------------------------------------- Physician Orders Details Patient Name: Date of Service: [name], [name] 11/26/2021 9:45 A M Medical Record Number:  [id number] Patient Account Number: [id number] Date of Birth/Sex: Treating RN: 1975/02/15 (46 y.o. [name] Primary Care Provider: [name] Other Clinician: [name] [facility], Kyleen Jerilynn Mages ([id number]) [id number] Page 3 of 8 Referring Provider: Treating Provider/Extender: RO BSO N, MICHA EL Patrick Jupiter in Treatment: 14 Verbal / Phone Orders: No Diagnosis Coding Follow-up Appointments Return Appointment in 1 week. Nurse Visit as needed Bathing/ Shower/ Hygiene May shower with wound dressing protected with water repellent cover or cast protector. No tub bath. Anesthetic (Use 'Patient Medications' Section for Anesthetic Order Entry) Lidocaine applied to wound bed Edema Control - Lymphedema / Segmental Compressive Device / Other Elevate, Exercise Daily and A void Standing for Long Periods of Time. Elevate legs to the level of the heart and pump ankles as often as possible Elevate leg(s) parallel to the floor when sitting. Off-Loading Total Contact Cast to Right Lower Extremity - TCC #3 applied Medications-Please add to medication list. Wound #2 Posterior Foot Take one 592m Tylenol (A cetaminophen) and one 2083mMotrin (Ibuprofen) every 6 hours for pain. Do not take ibuprofen if you are on blood thinners or have stomach ulcers. - take every 4 hours while awake Wound Treatment Wound #2 - Foot Wound Laterality: Posterior Cleanser: Byram Ancillary Kit - 15 Day Supply (Generic) 3 x  Per Day/30 Days Discharge Instructions: Use supplies as instructed; Kit contains: (15) Saline Bullets; (15) 3x3 Gauze; 15 pr Gloves Cleanser: Soap and Water 3 x Per Day/30 Days Discharge Instructions: Gently cleanse wound with antibacterial soap, rinse and pat dry prior to dressing wounds Topical: Gentamicin 3 x Per Day/30 Days Discharge Instructions: Apply as directed by provider. Prim Dressing: Prisma 4.34 (in) 3 x Per Day/30 Days ary Discharge  Instructions: Moisten w/normal saline or sterile water; Cover wound as directed. Do not remove from wound bed. Secondary Dressing: Zetuvit Plus 4x4 (in/in) 3 x Per Day/30 Days Electronic Signature(s) Signed: 11/26/2021 12:52:59 PM By: [name] Signed: 11/26/2021 3:56:39 PM By: Linton Ham MD Entered By: [name] on 11/26/2021 10:28:55 -------------------------------------------------------------------------------- Problem List Details Patient Name: Date of Service: [name], [name] 11/26/2021 9:45 A M Medical Record Number: [id number] Patient Account Number: [id number] Date of Birth/Sex: Treating RN: 06-May-1975 (46 y.o. [name] Primary Care Provider: [name] Other Clinician: [name] Referring Provider: Treating Provider/Extender: Eldridge Dace, MICHA EL Patrick [place] in Treatment: [address], [place] Jerilynn Mages ([id number]) [id number] Page 4 of 8 Active Problems ICD-10 Encounter Code Description Active Date MDM Diagnosis M86.671 Other chronic osteomyelitis, right ankle and foot 08/20/2021 No Yes L97.512 Non-pressure chronic ulcer of other part of right foot with fat layer exposed 08/20/2021 No Yes G71.00 Muscular dystrophy, unspecified 08/20/2021 No Yes 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 11/26/2021 9:45 A M Medical Record Number: [id number] Patient Account Number: [id number] Date of Birth/Sex: Treating RN: [birth year] (46 y.o. [name] Primary Care Provider: [name] Other Clinician: [name] Referring Provider: Treating  Provider/Extender: RO BSO N, [place]ull[place] EL Carolan Shiver, Randel Pigg in Treatment: 14 Subjective History of Present Illness (HPI) The following HPI elements were documented for the patient's wound: Location: right first metatarsal head plantar aspect Quality: Patient reports experiencing a dull pain to affected area(s). Severity: Patient states wound are getting worse. Duration: Patient has had the wound for > 2 months prior to seeking treatment at the wound center Timing: Pain in wound is Intermittent (comes and goes Context: The wound appeared gradually over time Modifying Factors: Other treatment(s) tried include:pain clinic for her narcotics Associated Signs and Symptoms: Patient reports having difficulty standing for long periods. 46 year old patient sent to [name] by her PCP 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 Her notes were reviewed and there was extensive history of opioid treatment and management by the [facility] pain clinic from where she has been terminated. X-ray of the right foot foot was done on 03/13/2015 and was suggestive of active infection and possible osteomyelitis. her past surgical history includes amputation of the left big toe in 2011 and amputation of the right big toe in 2015. She was sitting the wound clinic at [facility] and we will try and obtain these notes. She is a smoker and smokes about 15 cigarettes a day. FATIME, BISWELL ([id number]) [id number] Page 5 of 8 06/12/2015 - x-ray of the right foot -- IMPRESSION:1. Soft tissue swelling of the plantar surface of the foot.  No underlying acute abnormality. 2. Amputation deformity again of the right great toe. Old healed bony deformity noted the right second metatarsal. No acute bony abnormality identified. We have not received  any reports from the [facility] hospital yet. She continues to smoke at least 10 cigarettes a day 06/19/2015 -- she has increasing pain in the right foot and also has had some low-grade fever. Her vascular test is scheduled for tomorrow and her MRIs not till next week. Readmission: 08-20-2021 upon evaluation today patient presents for initial inspection here in our clinic although she has been seen that was back in 2017. At that time she was having a issue with her right foot. With that being said upon inspection today she is actually having issues with the right foot as well on the medial aspect where she has some breakdown due to what I am assuming is pressure to the foot location. 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 I did review her culture report which showed evidence of bacteria including hemolytic Streptococcus group A as well as Staphylococcus aureus. The patient is currently on doxycycline and tells me that this is getting much better. T opical gentamicin is also being utilized. Also did review her arterial studies which show that she has on the right and ABI of 1.15 which is triphasic and appears to be excellent there is no significant peripheral artery disease noted the left is also. With ABI of 1.11 and a good report in that regard as well. Lastly I did also review her MRI of the right forefoot which was performed on 07-06-2021. This shows that she does have mild subcortical marrow edema in the stump of the first metatarsal concerning for osteomyelitis. There is also a malunited fracture of the second metatarsal. The patient has bone marrow edema in the base of the second third fourth and fifth metatarsals concerning for stress reaction obviously this infection is going to be an issue for [name] here as well she needs to be  treated appropriately and I do believe that the doxycycline is a good option right now although I may need to #1 extend this and #2 plan for even altering or adding to the treatment regimen depending on how things progress. Patient voiced understanding.Marland Kitchen 09-01-2021 upon evaluation today patient appears to be doing excellent in regard to her wound. She is actually showing signs of excellent improvement which is great news. Fortunately I do not see any signs of active infection locally or systemically at this point which is great news and overall I am extremely pleased with where things stand today. I do not see any evidence of active infection locally or systemically. 09-07-2021 upon evaluation today patient appears to be doing well with regard to her foot ulcer. This is actually showing signs of improvement which is great news. Fortunately there does not appear to be any signs of active infection locally or systemically at this time which is as well. 09-22-2021 upon evaluation today patient appears to be doing well currently in regard to her wound. This is measuring smaller and looking better were slowly making progress here. Fortunately there does not appear to be any evidence of active infection locally or systemically at this time. Upon inspection patient's wound bed showed evidence of good granulation and epithelization at this point. 10-05-2021 upon evaluation today patient's wound  shows a lot of callus buildup but the surface of the wound is actually doing quite well. She is going require some sharp debridement to clearway some of the necrotic debris. 10-19-2021 upon evaluation today patient appears to be doing better in regard to her wound which is actually measuring little bit smaller today. Fortunately there does not appear to be any signs of significant infection she does have quite a bit of callus we will get have to work on that today. Otherwise I think we are headed in the appropriate  direction. 11-09-2021 upon evaluation today patient appears to be doing well in regard to her wound from the standpoint of infection and actually appears to be doing better she actually just completed the doxycycline. With that being said I am hopeful that this took care of the osteomyelitis although that something that we will definitely keep a close eye on. With that being said I do believe that she is still having tremendous amount of callus buildup which I believe in turn is a result of her having issues here with Friction and callus buildup. With that being said I do believe that the patient would benefit here from a total contact cast I discussed that with her today. I do want to get more aggressive to get this closed as I think that if we do not then she is more likely to have a long standing issue here going forward. 11-17-2021 upon evaluation today patient appears to be doing well although her wound is still open. I do think that she is ready to be placed in a total contact cast. She actually has come back on Thursday to have this changed out. Fortunately and overall I do not see any signs of worsening and I think that we should be able to get this on without any complication or problem.. 11-19-2021 upon evaluation today patient appears to be doing well currently in regard to her wound. She has been tolerating the dressing changes without complication and overall I am extremely pleased with where we stand today. I do not see any evidence of active infection locally or systemically at this time which is great news. I think the total contact cast did an awesome job for her. 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 Only a very small area remains on the plantar foot. I had to use illumination to even see anything that looked open here. Much improved Objective Constitutional Sitting or standing Blood Pressure is within target  range for patient.. Pulse regular and within target range for patient.Marland Kitchen Respirations regular, non-labored and within target range.. Temperature is normal and within the target range for the patient.Marland Kitchen appears in no distress. Vitals Time Taken: 9:57 AM, Height: 67 in, Weight: 153 lbs, BMI: 24, Temperature: 98.9 F, Pulse: 92 bpm, Respiratory Rate: 16 breaths/min, Blood Pressure: 122/72 mmHg. General Notes: Wound exam; the area questions on the right foot. Very small area of the vast majority of this is epithelialized. Under intense illumination I am able to see a small area that does not look completely epithelialized although looking back on the pictures this is come down remarkably in the last week or 2. There is no evidence of surrounding infection no purulence. No debridement was necessary Integumentary (Hair, Skin) Wound #2 status is Open. Original cause of wound was Trauma. The date acquired was: 06/18/2021. The wound has been in treatment 14 weeks. The wound is located on the Posterior Foot. The wound  measures 0.1cm length x 0.1cm width x 0.1cm depth; 0.008cm^2 area and 0.001cm^3 volume. There is Fat Layer (Subcutaneous Tissue) exposed. There is no tunneling or undermining noted. There is a medium amount of serosanguineous drainage noted. There is large (67- 100%) pink granulation within the wound bed. There is a small (1-33%) amount of necrotic tissue within the wound bed including Adherent Slough. SANDAR, KRINKE ([id number]) [id number] Page 6 of 8 Assessment Active Problems ICD-10 Other chronic osteomyelitis, right ankle and foot Non-pressure chronic ulcer of other part of right foot with fat layer exposed Muscular dystrophy, unspecified Hereditary motor and sensory neuropathy Corns and callosities Procedures Wound #2 Pre-procedure diagnosis of Wound #2 is an Abscess located on the Posterior Foot . There was a T Contact Cast Procedure by Ricard Dillon,  MD. otal Post procedure Diagnosis Wound #2: Same as Pre-Procedure Notes: TCC #3 applied. Plan Follow-up Appointments: Return Appointment in 1 week. Nurse Visit as needed Bathing/ Shower/ Hygiene: May shower with wound dressing protected with water repellent cover or cast protector. No tub bath. Anesthetic (Use 'Patient Medications' Section for Anesthetic Order Entry): Lidocaine applied to wound bed Edema Control - Lymphedema / Segmental Compressive Device / Other: Elevate, Exercise Daily and Avoid Standing for Long Periods of Time. Elevate legs to the level of the heart and pump ankles as often as possible Elevate leg(s) parallel to the floor when sitting. Off-Loading: T Contact Cast to Right Lower Extremity - TCC #3 applied otal Medications-Please add to medication list.: Wound #2 Posterior Foot: T one 546m Tylenol (Acetaminophen) and one 2065mMotrin (Ibuprofen) every 6 hours for pain. Do not take ibuprofen if you are on blood thinners or ake have stomach ulcers. - take every 4 hours while awake WOUND #2: - Foot Wound Laterality: Posterior Cleanser: Byram Ancillary Kit - 15 Day Supply (Generic) 3 x Per Day/30 Days Discharge Instructions: Use supplies as instructed; Kit contains: (15) Saline Bullets; (15) 3x3 Gauze; 15 pr Gloves Cleanser: Soap and Water 3 x Per Day/30 Days Discharge Instructions: Gently cleanse wound with antibacterial soap, rinse and pat dry prior to dressing wounds Topical: Gentamicin 3 x Per Day/30 Days Discharge Instructions: Apply as directed by provider. Prim Dressing: Prisma 4.34 (in) 3 x Per Day/30 Days ary Discharge Instructions: Moisten w/normal saline or sterile water; Cover wound as directed. Do not remove from wound bed. Secondary Dressing: Zetuvit Plus 4x4 (in/in) 3 x Per Day/30 Days 1. Everything looked a lot better today. Very small area remains. We dressed this with moistened silver collagen and reapplied total contact cast in the standard  fashion. Hopefully closed in the next week 2. I talked to her about options with regards to offloading this area after this heals. She is either going to need callus pads with insoles for her shoes. She mentioned that [facility] and it actually might be beneficial for her to [facility] to the [facility] [place] [facility] about custom foot wear and inserts. As noted she is not a diabetic and it is unlikely these would be covered by standard insurance Electronic Signature(s) Signed: 11/26/2021 3:56:39 PM By: RoLinton HamD Entered By: RoLinton Hamn 11/26/2021 11:04:13 Dougan, MALaure [id number] Page 7 of 8 -------------------------------------------------------------------------------- Total Contact Cast Details Patient Name: Date of Service: NEDarrick Penna11/09/2021 9:45 A M Medical Record Number: [id number] Account Number: [id number] of Birth/Sex: Treating RN: 02/1975-09-264824.o. F)Kelly Shoresrimary Care Provider: SoTommi Rumpsther Clinician:  [name] Referring Provider: Treating Provider/Extender: RO BSO Delane Ginger, MICHA EL Patrick Jupiter in Treatment: 33 T Contact Cast Applied for Wound Assessment: otal Wound #2 Posterior Foot Performed By: Physician Ricard Dillon, MD Post Procedure Diagnosis Same as Pre-procedure Notes TCC #3 applied Electronic Signature(s) Signed: 11/26/2021 3:56:39 PM By: Linton Ham MD Entered By: Linton Ham on 11/26/2021 11:04:59 -------------------------------------------------------------------------------- SuperBill Details Patient Name: Date of Service: [name], MA TTIE M. 11/26/2021 Medical Record Number: [id number] Patient Account Number: [id number] Date of Birth/Sex: Treating RN: 10-Jul-1975 (46 y.o. 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 L84 Corns and callosities Facility Procedures : ALISAH, GRANDBERRY Code: [id number] Amazin M ([id number] Description: 29445 - APPLY TOTAL CONTACT LEG CAST ICD-10 Diagnosis Description L97.512 Non-pressure chronic ulcer of other part of right foot with fat layer exposed 49) [id number] Modifier: 0_Physician_ Quantity: 1 [id number].pdf Page 8 of 8 Physician Procedures : CPT4 Code Description Modifier [id number] [phone] - WC PHYS APPLY TOTAL CONTACT CAST ICD-10 Diagnosis Description J79.199 Non-pressure chronic ulcer of other part of right foot with fat layer exposed Quantity: 1 Electronic Signature(s) Signed: 11/26/2021 3:56:39 PM By: Linton Ham MD Entered By: Linton Ham on 11/26/2021 11:04:34

## 2021-11-27 NOTE — Progress Notes (Signed)
CHOLE, DRIVER ([id number]) [id number] Page 1 of 8 Visit Report for 11/26/2021 Arrival Information Details Patient Name: Date of Service: [name], [name]. 11/26/2021 9:45 A M Medical Record Number: [id number] Patient Account Number: [id number] Date of Birth/Sex: Treating RN: [birth year] (46 y.o. [name] Primary Care Jadene Stemmer: [name] Other Clinician: [name] Referring Airam Runions: Treating Modesto Ganoe/Extender: Eldridge Dace, MICHA EL Carolan Shiver, Randel Pigg in Treatment: 14 Visit Information History Since Last Visit All ordered tests and consults were completed: No Patient Arrived: Ambulatory Added or deleted any medications: No Arrival Time: 09:55 Any new allergies or adverse reactions: No Transfer Assistance: None Had a fall or experienced change in No Patient Requires Transmission-Based Precautions: No activities of daily living that may affect Patient Has Alerts: Yes risk of falls: Patient Alerts: ABI R 1.15 02/04/21 Signs or symptoms of abuse/neglect since last visito No ABI L 1.11 02/04/21 Hospitalized since last visit: No Implantable device outside of the clinic excluding No cellular tissue based products placed in the center since last visit: Pain Present Now: No Electronic Signature(s) Signed: 11/26/2021 12:52:59 PM By: [name] Entered By: [name] on 11/26/2021 09:56:55 -------------------------------------------------------------------------------- Clinic Level of Care Assessment Details Patient Name: Date of Service: [name]. 11/26/2021 9:45 A M Medical Record Number: [id number] Patient Account Number: [id number] Date of Birth/Sex: Treating RN: 02-24-1975 (46 y.o. [name] Primary Care Isreal Moline: [name] Other Clinician: [name] Referring Pasquale Matters: Treating Chizaram Latino/Extender: RO BSO N, MICHA EL Carolan Shiver, Randel Pigg in Treatment: 14 Clinic Level of Care Assessment  Items TOOL 1 Quantity Score _0  - 0 Use when EandM and Procedure is performed on INITIAL visit ASSESSMENTS - Nursing Assessment / Reassessment _1  - 0 General Physical Exam (combine w/ comprehensive assessment (listed just below) when performed on new pt. evals) _2  - 0 Comprehensive Assessment (HX, ROS, Risk Assessments, Wounds Hx, etc.) [name], Kelly M ([id number]) [id number] Page 2 of 8 ASSESSMENTS - Wound and Skin Assessment / Reassessment _3  - 0 Dermatologic / Skin Assessment (not related to wound area) ASSESSMENTS - Ostomy and/or Continence Assessment and Care _4  - 0 Incontinence Assessment and Management _5  - 0 Ostomy Care Assessment and Management (repouching, etc.) PROCESS - Coordination of Care _6  - 0 Simple Patient / Family Education for ongoing care _7  - 0 Complex (extensive) Patient / Family Education for ongoing care _8  - 0 Staff obtains Programmer, systems, Records, T Results / Process Orders est _9  - 0 Staff telephones HHA, Nursing Homes / Clarify orders / etc _10  - 0 Routine Transfer to another Facility (non-emergent condition) _11  - 0 Routine Hospital Admission (non-emergent condition) _12  - 0 New Admissions / Biomedical engineer / Ordering NPWT Apligraf, etc. , _13  - 0 Emergency Hospital Admission (emergent condition) PROCESS - Special Needs _14  - 0 Pediatric / Minor Patient Management _15  - 0 Isolation Patient Management _16  - 0 Hearing / Language / Visual special needs _17  - 0 Assessment of Community assistance (transportation, D/C planning, etc.) _18  - 0 Additional assistance / Altered mentation _19  - 0 Support Surface(s) Assessment (bed, cushion, seat, etc.) INTERVENTIONS - Miscellaneous _20  - 0 External ear exam _21  - 0 Patient Transfer (multiple staff / Civil Service fast streamer / Similar devices) _22  - 0 Simple Staple / Suture removal (25 or less) _23  - 0 Complex Staple / Suture removal (26 or more) _24  - 0 Hypo/Hyperglycemic Management (do  not check if billed separately) _25  - 0 Ankle / Brachial Index (ABI) - do not check if  billed separately Has the patient been seen at the hospital within the last three years: Yes Total Score: 0 Level Of Care: ____ Electronic Signature(s) Signed: 11/26/2021 12:52:59 PM By: [name] Entered By: [name] on 11/26/2021 10:29:03 -------------------------------------------------------------------------------- Encounter Discharge Information Details Patient Name: Date of Service: [name], Kelly Grills M. 11/26/2021 9:45 A M Medical Record Number: [id number] Patient Account Number: [id number] Date of Birth/Sex: Treating RN: October 08, 1975 (46 y.o. [name] Primary Care Caitriona Sundquist: [name] Other Clinician: [name] Referring Ranger Petrich: Treating Rasheena Talmadge/Extender: Eldridge Dace, MICHA EL Patrick Jupiter in Treatment: [address], [place] Jerilynn Mages ([id number]) [id number] Page 3 of 8 Encounter Discharge Information Items Discharge Condition: Stable Ambulatory Status: Ambulatory Discharge Destination: Home Transportation: Private Auto Accompanied By: self Schedule Follow-up Appointment: Yes Clinical Summary of Care: Electronic Signature(s) Signed: 11/26/2021 12:52:59 PM By: [name] Entered By: [name] on 11/26/2021 10:47:18 -------------------------------------------------------------------------------- Lower Extremity Assessment Details Patient Name: Date of Service: [name]. 11/26/2021 9:45 A M Medical Record Number: [id number] Patient Account Number: [id number] Date of Birth/Sex: Treating RN: [birth year] (46 y.o. [name] Primary Care Charmeka Freeburg: [name] Other Clinician: [name] Referring Zedrick Springsteen: Treating Rehman Levinson/Extender: RO BSO Delane Ginger, MICHA EL Patrick Jupiter in Treatment: 14 Electronic Signature(s) Signed: 11/26/2021 12:52:59 PM By: [name] Signed: 11/26/2021 5:19:22 PM By: Gretta Cool BSN,  RN, CWS, Kim RN, BSN Entered By: [name] on 11/26/2021 10:10:48 -------------------------------------------------------------------------------- Multi Wound Chart Details Patient Name: Date of Service: [name], Kelly Mons. 11/26/2021 9:45 A M Medical Record Number: [id number] Patient Account Number: [id number] Date of Birth/Sex: Treating RN: 1975-11-06 (46 y.o. [name] Primary Care Everline Mahaffy: [name] Other Clinician: [name] Referring Faris Coolman: Treating Euline Kimbler/Extender: RO BSO N, MICHA EL Carolan Shiver, Randel Pigg in Treatment: 14 Vital Signs Height(in): 67 Pulse(bpm): 92 Weight(lbs): 153 Blood Pressure(mmHg): 122/72 Body Mass Index(BMI): 24 Temperature(F): 98.9 Respiratory Rate(breaths/min): 16 [name], Kelly M ([id number]) [2:Photos:] [N/A:N/A] Posterior Foot N/A N/A Wound Location: Trauma N/A N/A Wounding Event: Abscess N/A N/A Primary Etiology: Anemia, Asthma, History of pressure N/A N/A Comorbid History: wounds, Osteomyelitis, Neuropathy 06/18/2021 N/A N/A Date Acquired: 14 N/A N/A Weeks of Treatment: Open N/A N/A Wound Status: No N/A N/A Wound Recurrence: 0.1x0.1x0.1 N/A N/A Measurements L x W x D (cm) 0.008 N/A N/A A (cm) : rea 0.001 N/A N/A Volume (cm) : 98.90% N/A N/A % Reduction in Area: 99.60% N/A N/A % Reduction in Volume: Full Thickness Without Exposed N/A N/A Classification: Support Structures Medium N/A N/A Exudate Amount: Serosanguineous N/A N/A Exudate Type: red, brown N/A N/A Exudate Color: Large (67-100%) N/A N/A Granulation Amount: Pink N/A N/A Granulation Quality: Small (1-33%) N/A N/A Necrotic Amount: Fat Layer (Subcutaneous Tissue): Yes N/A N/A Exposed Structures: Fascia: No Tendon: No Muscle: No Joint: No Bone: No Medium (34-66%) N/A N/A Epithelialization: Treatment Notes Electronic Signature(s) Signed: 11/26/2021 12:52:59 PM By: [name] Entered By: [name] on 11/26/2021  10:11:00 -------------------------------------------------------------------------------- [place] Details Patient Name: Date of Service: [name], Kelly Grills M. 11/26/2021 9:45 A M Medical Record Number: [id number] Patient Account Number: [id number] Date of Birth/Sex: Treating RN: 1975-09-23 (46 y.o. [name] Primary Care Fatina Sprankle: [name] Other Clinician: [name] Referring Ysidra Sopher: Treating Jayleene Glaeser/Extender: RO BSO N, MICHA EL Carolan Shiver, Randel Pigg in Treatment: 14 Active Inactive Wound/Skin Impairment Nursing Diagnoses: Knowledge deficit related to ulceration/compromised skin integrity Goals: [name], [name] ([id number]) [id number] Page 5 of 8 Patient/caregiver will verbalize understanding of skin care regimen  Date Initiated: 08/20/2021 Target Resolution Date: 09/20/2021 Goal Status: Active Ulcer/skin breakdown will have a volume reduction of 30% by week 4 Date Initiated: 08/20/2021 Target Resolution Date: 09/20/2021 Goal Status: Active Ulcer/skin breakdown will have a volume reduction of 50% by week 8 Date Initiated: 08/20/2021 Target Resolution Date: 10/20/2021 Goal Status: Active Ulcer/skin breakdown will have a volume reduction of 80% by week 12 Date Initiated: 08/20/2021 Target Resolution Date: 11/20/2021 Goal Status: Active Ulcer/skin breakdown will heal within 14 weeks Date Initiated: 08/20/2021 Target Resolution Date: 12/20/2021 Goal Status: Active Interventions: Assess patient/caregiver ability to obtain necessary supplies Assess patient/caregiver ability to perform ulcer/skin care regimen upon admission and as needed Assess ulceration(s) every visit Notes: Electronic Signature(s) Signed: 11/26/2021 12:52:59 PM By: [name] Signed: 11/26/2021 5:19:22 PM By: Gretta Cool, BSN, RN, CWS, Kim RN, BSN Entered By: [name] on 11/26/2021  10:10:51 -------------------------------------------------------------------------------- Pain Assessment Details Patient Name: Date of Service: [name], Kelly Mons. 11/26/2021 9:45 A M Medical Record Number: [id number] Patient Account Number: [id number] Date of Birth/Sex: Treating RN: Jan 15, 1976 (46 y.o. [name] Primary Care Amiree No: [name] Other Clinician: [name] Referring Rukaya Kleinschmidt: Treating Aarohi Redditt/Extender: RO BSO N, MICHA EL Patrick Jupiter in Treatment: 14 Active Problems Location of Pain Severity and Description of Pain Patient Has Paino No Site Locations Pain Management and Medication [name], [name] ([id number]) [id number] Page 6 of 8 Current Pain Management: Electronic Signature(s) Signed: 11/26/2021 12:52:59 PM By: [name] Signed: 11/26/2021 5:19:22 PM By: Gretta Cool, BSN, RN, CWS, Kim RN, BSN Entered By: [name] on 11/26/2021 10:09:39 -------------------------------------------------------------------------------- Patient/Caregiver Education Details Patient Name: Date of Service: [name] 11/9/2023andnbsp9:45 A M Medical Record Number: [id number] Patient Account Number: [id number] Date of Birth/Gender: Treating RN: Jul 04, 1975 (46 y.o. [name] Primary Care Physician: [name] Other Clinician: [name] Referring Physician: Treating Physician/Extender: RO BSO Delane Ginger, MICHA EL Carolan Shiver, Randel Pigg in Treatment: 14 Education Assessment Education Provided To: Patient Education Topics Provided Wound/Skin Impairment: Handouts: Other: continue wound care as directed Methods: Explain/Verbal Responses: State content correctly Electronic Signature(s) Signed: 11/26/2021 12:52:59 PM By: [name] Entered By: [name] on 11/26/2021 10:29:41 -------------------------------------------------------------------------------- Wound Assessment Details Patient Name: Date  of Service: [name], Kelly Mons. 11/26/2021 9:45 A M Medical Record Number: [id number] Patient Account Number: [id number] Date of Birth/Sex: Treating RN: 16-Aug-1975 (46 y.o. [name] Primary Care Latavius Capizzi: [name] Other Clinician: [name] Referring Bleu Minerd: Treating Geneviene Tesch/Extender: RO BSO N, MICHA EL Carolan Shiver, Eric Weeks in Treatment: 14 Wound Status Wound Number: 2 Primary Abscess Etiology: Wound Location: Posterior Foot [name], Kelly M ([id number]) [id number] Page 7 of 8 Wound Status: Open Wounding Event: Trauma Comorbid Anemia, Asthma, History of pressure wounds, Osteomyelitis, Date Acquired: 06/18/2021 History: Neuropathy Weeks Of Treatment: 14 Clustered Wound: No Photos Wound Measurements Length: (cm) 0.1 Width: (cm) 0.1 Depth: (cm) 0.1 Area: (cm) 0.008 Volume: (cm) 0.001 % Reduction in Area: 98.9% % Reduction in Volume: 99.6% Epithelialization: Medium (34-66%) Tunneling: No Undermining: No Wound Description Classification: Full Thickness Without Exposed Support Structures Exudate Amount: Medium Exudate Type: Serosanguineous Exudate Color: red, brown Foul Odor After Cleansing: No Slough/Fibrino No Wound Bed Granulation Amount: Large (67-100%) Exposed Structure Granulation Quality: Pink Fascia Exposed: No Necrotic Amount: Small (1-33%) Fat Layer (Subcutaneous Tissue) Exposed: Yes Necrotic Quality: Adherent Slough Tendon Exposed: No Muscle Exposed: No Joint Exposed: No Bone Exposed: No Treatment Notes Wound #2 (Foot) Wound Laterality: Posterior Cleanser Byram Ancillary Kit - 15 Day Supply Discharge Instruction:  Use supplies as instructed; Kit contains: (15) Saline Bullets; (15) 3x3 Gauze; 15 pr Gloves Soap and Water Discharge Instruction: Gently cleanse wound with antibacterial soap, rinse and pat dry prior to dressing wounds Peri-Wound Care Topical Gentamicin Discharge Instruction: Apply as  directed by Maximilien Hayashi. Primary Dressing Prisma 4.34 (in) Discharge Instruction: Moisten w/normal saline or sterile water; Cover wound as directed. Do not remove from wound bed. Secondary Dressing Zetuvit Plus 4x4 (in/in) Secured With Compression Wrap Compression Stockings Add-Ons Electronic Signature(s) Signed: 11/26/2021 12:52:59 PM By: [name] Signed: 11/26/2021 5:19:22 PM By: Gretta Cool, BSN, RN, CWS, Kim RN, BSN [name], Kelly M ([id number]) By: Gretta Cool, BSN, RN, CWS, Kim RN, BSN [phone].pdf Page 8 of 8 Signed: 11/26/2021 5:19:22 PM Entered By: [name] on 11/26/2021 10:10:38 -------------------------------------------------------------------------------- Vitals Details Patient Name: Date of Service: [name], Kelly Mons. 11/26/2021 9:45 A M Medical Record Number: [id number] Patient Account Number: [id number] Date of Birth/Sex: Treating RN: [birth year] (46 y.o. Charolette Forward, Kim Primary Care Ayerim Berquist: [name] Other Clinician: [name] Referring Sally-Ann Cutbirth: Treating Elliott Lasecki/Extender: RO BSO N, [place] EL Carolan Shiver, Randel Pigg in Treatment: 14 Vital Signs Time Taken: 09:57 Temperature (F): 98.9 Height (in): 67 Pulse (bpm): 92 Weight (lbs): 153 Respiratory Rate (breaths/min): 16 Body Mass Index (BMI): 24 Blood Pressure (mmHg): 122/72 Reference Range: 80 - 120 mg / dl Electronic Signature(s) Signed: 11/26/2021 12:52:59 PM By: [name] Entered By: [name] on 11/26/2021 10:09:35

## 2021-12-03 ENCOUNTER — Encounter: Payer: Medicare PPO | Admitting: Physician Assistant

## 2021-12-03 DIAGNOSIS — G7100 Muscular dystrophy, unspecified: Secondary | ICD-10-CM | POA: Diagnosis not present

## 2021-12-03 DIAGNOSIS — L84 Corns and callosities: Secondary | ICD-10-CM | POA: Diagnosis not present

## 2021-12-03 DIAGNOSIS — L02611 Cutaneous abscess of right foot: Secondary | ICD-10-CM | POA: Diagnosis not present

## 2021-12-03 DIAGNOSIS — Z89411 Acquired absence of right great toe: Secondary | ICD-10-CM | POA: Diagnosis not present

## 2021-12-03 DIAGNOSIS — M86671 Other chronic osteomyelitis, right ankle and foot: Secondary | ICD-10-CM | POA: Diagnosis not present

## 2021-12-03 DIAGNOSIS — L97512 Non-pressure chronic ulcer of other part of right foot with fat layer exposed: Secondary | ICD-10-CM | POA: Diagnosis not present

## 2021-12-03 DIAGNOSIS — Z89412 Acquired absence of left great toe: Secondary | ICD-10-CM | POA: Diagnosis not present

## 2021-12-03 DIAGNOSIS — G600 Hereditary motor and sensory neuropathy: Secondary | ICD-10-CM | POA: Diagnosis not present

## 2021-12-03 NOTE — Progress Notes (Addendum)
Kelly, [name] ([id number]) [id number] Page 1 of 8 Visit Report for 12/03/2021 Chief Complaint Document Details Patient Name: Date of Service: [name]. 12/03/2021 8:45 A M Medical Record Number: [id number] Patient Account Number: [id number] Date of Birth/Sex: Treating RN: [birth year] (46 y.o. [name] Primary Care Provider: [name] Other Clinician: Referring Provider: Treating Provider/Extender: [name] in Treatment: 15 Information Obtained from: Patient Chief Complaint Right foot ulcer Electronic Signature(s) Signed: 12/03/2021 9:11:46 AM By: [name] Entered By: Worthy Keeler on 12/03/2021 09:11:45 -------------------------------------------------------------------------------- HPI Details Patient Name: Date of Service: [name], [name]. 12/03/2021 8:45 A M Medical Record Number: [id number] Patient Account Number: [id number] Date of Birth/Sex: Treating RN: 1975/04/17 (46 y.o. [name] Primary Care Provider: [name] Other Clinician: Referring Provider: Treating Provider/Extender: [name] in Treatment: 15 History of Present Illness Location: right first metatarsal head plantar aspect Quality: Patient reports experiencing a dull pain to affected area(s). Severity: Patient states wound are getting worse. Duration: Patient has had the wound for > 2 months prior to seeking treatment at the wound center Timing: Pain in wound is Intermittent (comes and goes Context: The wound appeared gradually over time Modifying Factors: Other treatment(s) tried include:pain clinic for her narcotics ssociated Signs and Symptoms: Patient reports having difficulty standing for long periods. A HPI Description: 46 year old patient sent to [name] by her PCP [name] who saw her recently on 05/15/2015 for chronic pain related to Charcot [name] with her feet with a history of bilateral great toe amputations for osteomyelitis. Her notes were reviewed and there was extensive history of opioid treatment and management by the [facility] pain clinic from where she has been terminated. X-ray of the right foot foot was done on 03/13/2015 and was suggestive of active infection and possible osteomyelitis. her past surgical history includes amputation of the left big toe in 2011 and amputation of the right big toe in 2015. She was sitting the wound clinic at [facility] and we will try and obtain these notes. She is a smoker and smokes about 15 cigarettes a day. SU, DUMA ([id number]) [id number] Page 2 of 8 06/12/2015 - x-ray of the right foot -- IMPRESSION:1. Soft tissue swelling of the plantar surface of the foot. No underlying acute abnormality. 2. Amputation deformity again of the right great toe. Old healed bony deformity noted the right second metatarsal. No acute bony abnormality identified. We have not received any reports from the [facility] hospital yet. She continues to smoke at least 10 cigarettes a day 06/19/2015 -- she has increasing pain in the right foot and also has had some low-grade fever. Her vascular test is scheduled for tomorrow and her MRIs not till next week. Readmission: 08-20-2021 upon evaluation today patient presents for initial inspection here in our clinic although she has been seen that was back in 2017. At that time she was having a issue with her right foot. With that being said upon inspection today she is actually having issues with the right foot as well on the medial aspect where she has some breakdown due to what I am assuming is pressure to the foot location. 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 I did review her culture  report which showed evidence of bacteria including hemolytic Streptococcus group A as well as Staphylococcus aureus. The patient is currently on doxycycline and tells me that this is getting much better. T opical gentamicin is also being utilized. Also did review her arterial studies which show that she has on the right and ABI of 1.15 which is triphasic and appears to be excellent there is no significant peripheral artery disease noted the left is also. With ABI of 1.11 and a good report in that regard as well. Lastly I did also review her MRI of the right forefoot which was performed on 07-06-2021. This shows that she does have mild subcortical marrow edema in the stump of the first metatarsal concerning for osteomyelitis. There is also a malunited fracture of the second metatarsal. The patient has bone marrow edema in the base of the second third fourth and fifth metatarsals concerning for stress reaction obviously this infection is going to be an issue for [name] here as well she needs to be treated appropriately and I do believe that the doxycycline is a good option right now although I may need to #1 extend this and #2 plan for even altering or adding to the treatment regimen depending on how things progress. Patient voiced understanding.Marland Kitchen 09-01-2021 upon evaluation today patient appears to be doing excellent in regard to her wound. She is actually showing signs of excellent improvement which is great news. Fortunately I do not see any signs of active infection locally or systemically at this point which is great news and overall I am extremely pleased with where things stand today. I do not see any evidence of active infection locally or systemically. 09-07-2021 upon evaluation today patient appears to be doing well with regard to her foot ulcer. This is actually showing signs of improvement which is great news. Fortunately there does not appear to be any signs of  active infection locally or systemically at this time which is as well. 09-22-2021 upon evaluation today patient appears to be doing well currently in regard to her wound. This is measuring smaller and looking better were slowly making progress here. Fortunately there does not appear to be any evidence of active infection locally or systemically at this time. Upon inspection patient's wound bed showed evidence of good granulation and epithelization at this point. 10-05-2021 upon evaluation today patient's wound shows a lot of callus buildup but the surface of the wound is actually doing quite well. She is going require some sharp debridement to clearway some of the necrotic debris. 10-19-2021 upon evaluation today patient appears to be doing better in regard to her wound which is actually measuring little bit smaller today. Fortunately there does not appear to be any signs of significant infection she does have quite a bit of callus we will get have to work on that today. Otherwise I think we are headed in the appropriate direction. 11-09-2021 upon evaluation today patient appears to be doing well in regard to her wound from the standpoint of infection and actually appears to be doing better she actually just completed the doxycycline. With that being said I am hopeful that this took care of the osteomyelitis although that something that we will definitely keep a close eye on. With that being said I do believe that she is still having tremendous amount of callus buildup which I believe in turn is a result of her having issues here with Friction and callus buildup. With that being said I do believe that the  patient would benefit here from a total contact cast I discussed that with her today. I do want to get more aggressive to get this closed as I think that if we do not then she is more likely to have a long standing issue here going forward. 11-17-2021 upon evaluation today patient appears to be doing  well although her wound is still open. I do think that she is ready to be placed in a total contact cast. She actually has come back on Thursday to have this changed out. Fortunately and overall I do not see any signs of worsening and I think that we should be able to get this on without any complication or problem.. 11-19-2021 upon evaluation today patient appears to be doing well currently in regard to her wound. She has been tolerating the dressing changes without complication and overall I am extremely pleased with where we stand today. I do not see any evidence of active infection locally or systemically at this time which is great news. I think the total contact cast did an awesome job for her. 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 Only a very small area remains on the plantar foot. I had to use illumination to even see anything that looked open here. Much improved 12-03-2021 upon evaluation today patient appears to be doing well currently in regard to her wound. Of note she tells me that she did end up cutting off her cast with a pair of kitchen shears 2 nights ago. She states that she has been walking on it more than she should have after the cast was put on due to a friend who ended up in the hospital that it caught her right after she had a put on that same day. She feels like she probably deformed the cast that it was causing some pressure points that she was afraid to get a blister and did not want another wound. For that reason she did go ahead and cut the soft and does not appear to have caused any damage. She tells me that it only took 7 minutes. With that being said I do think the wound actually looks really good she has a lot of callus buildup I am going to remove that just to make sure nothing side underneath but other than that overall she seems to be doing quite well. Electronic Signature(s) Signed: 12/03/2021  9:25:29 AM By: [name] Entered By: Worthy Keeler on 12/03/2021 09:25:29 -------------------------------------------------------------------------------- Callus Pairing Details Patient Name: Date of Service: [name], [name]. 12/03/2021 8:45 A Charm Rings, Gracemarie Jerilynn Mages ([id number]) [id number] Page 3 of 8 Medical Record Number: [id number] Patient Account Number: [id number] Date of Birth/Sex: Treating RN: 11-05-1975 (46 y.o. [name] Primary Care Provider: [name] Other Clinician: Referring Provider: Treating Provider/Extender: [name] in Treatment: 15 Procedure Performed for: Wound #2 Posterior Foot Performed By: Physician [name], [name] Post Procedure Diagnosis Same as Pre-procedure Electronic Signature(s) Signed: 12/04/2021 7:25:15 AM By: Gretta Cool, BSN, RN, CWS, Kim RN, BSN Entered By: Gretta Cool, BSN, RN, CWS, Kim on 12/03/2021 09:17:01 -------------------------------------------------------------------------------- Physical Exam Details Patient Name: Date of Service: [name]. 12/03/2021 8:45 A M Medical Record Number: [id number] Patient Account Number: [id number] Date of Birth/Sex: Treating RN: 15-Jun-1975 (46 y.o. [name] Primary Care Provider: [name] Other Clinician: Referring Provider: Treating Provider/Extender: [name] in Treatment: 50 Constitutional Well-nourished  and well-hydrated in no acute distress. Respiratory normal breathing without difficulty. Psychiatric this patient is able to make decisions and demonstrates good insight into disease process. Alert and Oriented x 3. pleasant and cooperative. Notes Upon inspection patient's wound bed actually showed signs of good granulation and epithelization at this point. Fortunately there does not appear to be any evidence of active infection locally or systemically at this time which is great  news. Overall I am extremely pleased with where we stand and how things appear but I do believe that she still needs to double down and be very careful about the amount of friction and pressure getting to this foot. She really does not want to go back in the cast we can use a peg assist offloading shoe. Electronic Signature(s) Signed: 12/03/2021 9:26:14 AM By: [name] Entered By: Worthy Keeler on 12/03/2021 09:26:13 Physician Orders Details -------------------------------------------------------------------------------- [name] ([id number]) [id number] Page 4 of 8 Patient Name: Date of Service: [name], [name] 12/03/2021 8:45 A M Medical Record Number: [id number] Patient Account Number: [id number] Date of Birth/Sex: Treating RN: November 11, 1975 (46 y.o. 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 Nurse Visit as needed Bathing/ Shower/ Hygiene May shower with wound dressing protected with water repellent cover or cast protector. No tub bath. Edema Control - Lymphedema / Segmental Compressive Device / Other Elevate, Exercise Daily and A void Standing for Long Periods of Time. Elevate legs to the level of the heart and pump ankles as often as possible Elevate leg(s) parallel to the floor when sitting. Medications-Please add to medication list. Wound #2 Posterior Foot Take one 556m Tylenol (A cetaminophen) and one 2040mMotrin  (Ibuprofen) every 6 hours for pain. Do not take ibuprofen if you are on blood thinners or have stomach ulcers. - take every 4 hours while awake Wound Treatment Wound #2 - Foot Wound Laterality: Posterior Cleanser: Byram Ancillary Kit - 15 Day Supply (Generic) 3 x Per Day/30 Days Discharge Instructions: Use supplies as instructed; Kit contains: (15) Saline Bullets; (15) 3x3 Gauze; 15 pr Gloves Cleanser: Soap and Water 3 x Per Day/30 Days Discharge Instructions: Gently cleanse wound with antibacterial soap, rinse and pat dry prior to dressing wounds Prim Dressing: Prisma 4.34 (in) 3 x Per Day/30 Days ary Discharge Instructions: Moisten w/normal saline or sterile water; Cover wound as directed. Do not remove from wound bed. Secondary Dressing: Zetuvit Plus 4x4 (in/in) 3 x Per Day/30 Days Electronic Signature(s) Signed: 12/04/2021 7:25:15 AM By: WoGretta CoolBSN, RN, CWS, Kim RN, BSN Signed: 12/04/2021 2:32:55 PM By: StWorthy KeelerA-C Entered By: WoGretta CoolBSN, RN, CWS, Kim on 12/03/2021 09:41:06 -------------------------------------------------------------------------------- Problem List Details Patient Name: Date of Service: NETheodoro ClockMAHarrison Mons11/16/2023 8:45 A M Medical Record Number: [id number] Account Number: [id number] of Birth/Sex: Treating RN: 02/1975-01-074839.o. 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 12/03/2021 8:45 A M Medical Record Number: [id number] Patient Account Number: [id number] Date of Birth/Sex: Treating RN: [birth year] (46 y.o. [name] Primary Care Provider: [name] Other Clinician: Referring Provider: Treating Provider/Extender: [name] in Treatment: 15 Subjective Chief Complaint Information obtained from Patient Right foot ulcer History of Present Illness (HPI) The following HPI elements were documented for the patient's wound: Location: right first metatarsal head plantar aspect Quality: Patient reports experiencing a dull pain to affected area(s). Severity: Patient states wound are getting worse. Duration: Patient has had the wound for > 2 months prior to seeking treatment at the wound center Timing: Pain in wound is Intermittent (comes and goes Context: The wound appeared gradually over time KENZLI, BARRITT ([id number]) [id number] Page 6 of 8 Modifying Factors: Other treatment(s) tried include:pain clinic for her narcotics Associated Signs and Symptoms: Patient reports having difficulty standing for long periods. 46 year old patient sent to [name] by her PCP 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 Her notes were reviewed and there was extensive history of opioid treatment and management by the [facility] pain clinic from where she has been terminated. X-ray of the right  foot foot was done on 03/13/2015 and was suggestive of active infection and possible osteomyelitis. her past surgical history includes amputation of the left big toe in 2011 and amputation of the right big toe in 2015. She was sitting the wound clinic at [facility] and we will try and obtain these notes. She is a smoker and smokes about 15 cigarettes a day. 06/12/2015 - x-ray of the right foot -- IMPRESSION:1. Soft tissue swelling of the plantar surface of the foot. No underlying acute abnormality. 2. Amputation deformity again of the right great toe. Old healed bony deformity noted the right second metatarsal. No acute bony abnormality identified. We have not received any reports from the [facility] hospital yet. She continues to smoke at least 10 cigarettes a day 06/19/2015 -- she has increasing pain in the right foot and also has had some low-grade fever. Her vascular test is scheduled for tomorrow and her MRIs not till next week. Readmission: 08-20-2021 upon evaluation today patient presents for initial inspection here in our clinic although she has been seen that was back in 2017. At that time she was having a issue with her right foot. With that being said upon inspection today she is actually having issues with the right foot as well on the medial aspect where she has some breakdown due to what I am assuming is pressure to the foot location. 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 I did review her culture report which showed evidence of bacteria  including hemolytic Streptococcus group A as well as Staphylococcus aureus. The patient is currently on doxycycline and tells me that this is getting much better. T opical gentamicin is also being utilized. Also did review her arterial studies which show that she has on the right  and ABI of 1.15 which is triphasic and appears to be excellent there is no significant peripheral artery disease noted the left is also. With ABI of 1.11 and a good report in that regard as well. Lastly I did also review her MRI of the right forefoot which was performed on 07-06-2021. This shows that she does have mild subcortical marrow edema in the stump of the first metatarsal concerning for osteomyelitis. There is also a malunited fracture of the second metatarsal. The patient has bone marrow edema in the base of the second third fourth and fifth metatarsals concerning for stress reaction obviously this infection is going to be an issue for [name] here as well she needs to be treated appropriately and I do believe that the doxycycline is a good option right now although I may need to #1 extend this and #2 plan for even altering or adding to the treatment regimen depending on how things progress. Patient voiced understanding.Marland Kitchen 09-01-2021 upon evaluation today patient appears to be doing excellent in regard to her wound. She is actually showing signs of excellent improvement which is great news. Fortunately I do not see any signs of active infection locally or systemically at this point which is great news and overall I am extremely pleased with where things stand today. I do not see any evidence of active infection locally or systemically. 09-07-2021 upon evaluation today patient appears to be doing well with regard to her foot ulcer. This is actually showing signs of improvement which is great news. Fortunately there does not appear to be any signs of active infection locally or systemically at this time which is as well. 09-22-2021 upon evaluation today patient appears to be doing well currently in regard to her wound. This is measuring smaller and looking better were slowly making progress here. Fortunately there does not appear to be any evidence of active infection locally or systemically at this time.  Upon inspection patient's wound bed showed evidence of good granulation and epithelization at this point. 10-05-2021 upon evaluation today patient's wound shows a lot of callus buildup but the surface of the wound is actually doing quite well. She is going require some sharp debridement to clearway some of the necrotic debris. 10-19-2021 upon evaluation today patient appears to be doing better in regard to her wound which is actually measuring little bit smaller today. Fortunately there does not appear to be any signs of significant infection she does have quite a bit of callus we will get have to work on that today. Otherwise I think we are headed in the appropriate direction. 11-09-2021 upon evaluation today patient appears to be doing well in regard to her wound from the standpoint of infection and actually appears to be doing better she actually just completed the doxycycline. With that being said I am hopeful that this took care of the osteomyelitis although that something that we will definitely keep a close eye on. With that being said I do believe that she is still having tremendous amount of callus buildup which I believe in turn is a result of her having issues here with Friction and callus buildup. With that being said I do believe that the patient would benefit here from  a total contact cast I discussed that with her today. I do want to get more aggressive to get this closed as I think that if we do not then she is more likely to have a long standing issue here going forward. 11-17-2021 upon evaluation today patient appears to be doing well although her wound is still open. I do think that she is ready to be placed in a total contact cast. She actually has come back on Thursday to have this changed out. Fortunately and overall I do not see any signs of worsening and I think that we should be able to get this on without any complication or problem.. 11-19-2021 upon evaluation today patient  appears to be doing well currently in regard to her wound. She has been tolerating the dressing changes without complication and overall I am extremely pleased with where we stand today. I do not see any evidence of active infection locally or systemically at this time which is great news. I think the total contact cast did an awesome job for her. 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 Only a very small area remains on the plantar foot. I had to use illumination to even see anything that looked open here. Much improved 12-03-2021 upon evaluation today patient appears to be doing well currently in regard to her wound. Of note she tells me that she did end up cutting off her cast with a pair of kitchen shears 2 nights ago. She states that she has been walking on it more than she should have after the cast was put on due to a friend who ended up in the hospital that it caught her right after she had a put on that same day. She feels like she probably deformed the cast that it was causing some pressure points that she was afraid to get a blister and did not want another wound. For that reason she did go ahead and cut the soft and does not appear to have caused any damage. She tells me that it only took 7 minutes. With that being said I do think the wound actually looks really good she has a lot of callus buildup I am going to remove that just to make sure nothing side underneath but other than that overall she seems to be doing quite well. Objective Constitutional Well-nourished and well-hydrated in no acute distress. VERLAINE, EMBRY ([id number]) [id number] Page 7 of 8 Vitals Time Taken: 9:03 AM, Height: 67 in, Weight: 153 lbs, BMI: 24, Temperature: 98.8 F, Pulse: 85 bpm, Respiratory Rate: 16 breaths/min, Blood Pressure: 117/80 mmHg. Respiratory normal breathing without  difficulty. Psychiatric this patient is able to make decisions and demonstrates good insight into disease process. Alert and Oriented x 3. pleasant and cooperative. General Notes: Upon inspection patient's wound bed actually showed signs of good granulation and epithelization at this point. Fortunately there does not appear to be any evidence of active infection locally or systemically at this time which is great news. Overall I am extremely pleased with where we stand and how things appear but I do believe that she still needs to double down and be very careful about the amount of friction and pressure getting to this foot. She really does not want to go back in the cast we can use a peg assist offloading shoe. Integumentary (Hair, Skin) Wound #2 status is Open. Original cause of wound was Trauma. The date acquired was: 06/18/2021. The  wound has been in treatment 15 weeks. The wound is located on the Posterior Foot. The wound measures 0.2cm length x 0.4cm width x 0.1cm depth; 0.063cm^2 area and 0.006cm^3 volume. There is Fat Layer (Subcutaneous Tissue) exposed. There is a medium amount of serosanguineous drainage noted. There is large (67-100%) pink granulation within the wound bed. There is no necrotic tissue within the wound bed. 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 An Callus Pairing procedure was performed by [name], [name]. Post procedure Diagnosis Wound #2: Same as Pre-Procedure Plan 1. I am going to suggest that we have the patient going continue to monitor for any signs of worsening or infection. Obviously based  on what I am seeing right now I do believe that she is doing quite well the wound size is very tiny she does have a lot of callus I removed the past that I think she is doing excellent and seems to be making great progress. The callus was surrounding the open wound area there was no debridement of the wound itself. 2. I am good recommend as well that the patient should continue to monitor for any evidence of worsening she is going to keep an eye on this every night and I do believe she will do so. She will bring the cast boot whether next week just in case we need to look into doing that at that point. We will see patient back for reevaluation in 1 week here in the clinic. If anything worsens or changes patient will contact our office for additional recommendations. Electronic Signature(s) Signed: 12/03/2021 9:27:03 AM By: [name] Entered By: Worthy Keeler on 12/03/2021 09:27:02 [name] ([id number]) [id number] Page 8 of 8 -------------------------------------------------------------------------------- SuperBill Details Patient Name: Date of Service: [name], [name] 12/03/2021 Medical Record Number: [id number] Patient Account Number: [id number] Date of Birth/Sex: Treating RN: 1975-12-17 (46 y.o. [name] Primary Care Provider: [name] Other Clinician: Referring Provider: Treating Provider/Extender: [name] in Treatment: 15 Diagnosis Coding ICD-10 Codes Code Description [phone] Other chronic osteomyelitis, right ankle and foot L97.512 Non-pressure chronic ulcer of other part of right foot with fat layer exposed G71.00 Muscular dystrophy, unspecified G60.0 Hereditary motor and sensory neuropathy L84 Corns and callosities Facility Procedures : CPT4 Code: [id number] Description: 07615 - PARE BENIGN LES; SGL ICD-10 Diagnosis Description L97.512 Non-pressure chronic ulcer of other part of right  foot with fat layer expos Modifier: ed Quantity: 1 Physician Procedures : CPT4 Code Description Modifier [id number] - WC PHYS PARE BENIGN LES; SGL ICD-10 Diagnosis Description L97.512 Non-pressure chronic ulcer of other part of right foot with fat layer exposed Quantity: 1 Electronic Signature(s) Signed: 12/03/2021 9:27:18 AM By: [name] Entered By: Worthy Keeler on 12/03/2021 09:27:18

## 2021-12-04 NOTE — Progress Notes (Signed)
[name], [name] ([id number]) [id number] Page 1 of 7 Visit Report for 12/03/2021 Arrival Information Details Patient Name: Date of Service: [name], [name]. 12/03/2021 8:45 A [name]: [id number] Patient Account Number: [id number] Date of Birth/Sex: Treating RN: Dec 31, 1975 (46 y.o. [name] Primary Care [name]: [name] Other Clinician: Referring [name]: Treating [name]/Extender: [name] in Treatment: 15 Visit Information History Since Last Visit Has Dressing in Place as Prescribed: No Patient Arrived: Ambulatory Has Footwear/Offloading in Place as Prescribed: No Arrival Time: 09:01 Right: T Contact Cast otal Accompanied By: self Pain Present Now: No Transfer Assistance: None Patient Identification Verified: Yes Secondary Verification Process Completed: Yes Patient Requires Transmission-Based Precautions: No Patient Has Alerts: Yes Patient Alerts: ABI R 1.15 02/04/21 ABI L 1.11 02/04/21 Electronic Signature(s) Signed: 12/04/2021 7:25:15 AM By: [name], BSN, RN, CWS, Kim RN, BSN Entered By: [name], BSN, RN, CWS, [name] on 12/03/2021 09:03:29 -------------------------------------------------------------------------------- Encounter Discharge Information Details Patient Name: Date of Service: [name], Kelly Mons. 12/03/2021 8:45 A [name]: [id number] Patient Account Number: [id number] Date of Birth/Sex: Treating RN: 1975-04-07 (46 y.o. [name] Primary Care [name]: [name] Other Clinician: Referring [name]: Treating [name]/Extender: [name] in Treatment: 15 Encounter Discharge Information Items Discharge Condition: Stable Ambulatory Status: Ambulatory Discharge Destination: Home Transportation: Private Auto Schedule Follow-up Appointment: Yes Clinical Summary of Care: Electronic Signature(s) Signed: 12/04/2021 7:25:15 AM By:  [name], BSN, RN, CWS, Kim RN, BSN [name] ([id number]) AM By: [name], BSN, RN, CWS, Kim RN, BSN [phone].pdf Page 2 of 7 Signed: 12/04/2021 7:25:15 Entered By: [name], BSN, RN, CWS, [name] on 12/03/2021 09:42:18 -------------------------------------------------------------------------------- General Visit Notes Details Patient Name: Date of Service: [name], Kelly Mons. 12/03/2021 8:45 A [name]: [id number] Patient Account Number: [id number] Date of Birth/Sex: Treating RN: May 30, 1975 (46 y.o. [name] Primary Care [name]: [name] Other Clinician: Referring [employer]: Treating Calianne Larue/Extender: [name] in Treatment: 15 Notes Patient had emergency with friend that had her on her feet more than usual. She felt like a blister was coming up on her foot. Patient removed cast with her kitchen shears on Tuesday night. Patient call the office on Wednesday morning to make [name] aware. Electronic Signature(s) Signed: 12/04/2021 7:25:15 AM By: [name], BSN, RN, CWS, Kim RN, BSN Entered By: [name], BSN, RN, CWS, [name] on 12/03/2021 09:27:33 -------------------------------------------------------------------------------- Lower Extremity Assessment Details Patient Name: Date of Service: [name], [name]. 12/03/2021 8:45 A [name]: [id number] Patient Account Number: [id number] Date of Birth/Sex: Treating RN: [birth year] (46 y.o. [name] Primary Care [name]: [name] Other Clinician: Referring [name]: Treating Kalyan Barabas/Extender: [name] in Treatment: 15 Edema Assessment Assessed: [name]: No] [name]: Yes] Edema: [Left: N] [Right: o] Vascular Assessment Pulses: Dorsalis Pedis Palpable: [Right:Yes] Electronic Signature(s) Signed: 12/04/2021 7:25:15 AM By: [name], BSN, RN, CWS, Kim RN, BSN Entered By: [name], BSN, RN, CWS, [name] on 12/03/2021 09:08:50 [name] ([id number]) [id number] Page 3 of 7 -------------------------------------------------------------------------------- Multi Wound Chart Details Patient Name: Date of Service: [name]. 12/03/2021 8:45 A [name]: [id number] Patient Account Number: [id number] Date of Birth/Sex: Treating RN: 1975/02/24 (46 y.o. [name] Primary Care [name]: [name] Other Clinician: Referring [name]: Treating [name]/Extender: [name] in Treatment: 15 Vital Signs Height(in): 67 Pulse(bpm): 85 Weight(lbs): 153 Blood Pressure(mmHg): 117/80 Body Mass Index(BMI): 24 Temperature(F): 98.8  Respiratory Rate(breaths/min): 16 [2:Photos: No Photos Posterior Foot Wound Location: Trauma Wounding Event: Abscess Primary Etiology: 06/18/2021 Date Acquired: 15 Weeks of Treatment: Open Wound Status: No Wound Recurrence: 0.1x0.1x0.1 Measurements L x W x D (cm) 0.008 A (cm) : rea 0.001 Volume  (cm) : 98.90% % Reduction in A rea: 99.60% % Reduction in Volume: Full Thickness Without Exposed Classification: Support Structures Medium Exudate Amount: Serosanguineous Exudate Type: red, brown Exudate Color:] [N/A:N/A N/A N/A N/A N/A N/A N/A N/A N/A  N/A N/A N/A N/A N/A N/A N/A N/A] Treatment Notes Electronic Signature(s) Signed: 12/04/2021 7:25:15 AM By: [name], BSN, RN, CWS, Kim RN, BSN Entered By: [name], BSN, RN, CWS, [name] on 12/03/2021 09:16:29 -------------------------------------------------------------------------------- Multi-Disciplinary Care Plan Details Patient Name: Date of Service: [name], Kelly Grills [name]. 12/03/2021 8:45 A [name]: [id number] Patient Account Number: [id number] Date of Birth/Sex: Treating RN: Dec 14, 1975 (46 y.o. [name] Primary Care [name]: [name] Other Clinician: Referring [name]: Treating Tamla Winkels/Extender: Kelly Amour [name]  ([id number]) [id number] Page 4 of 7 Weeks in Treatment: 15 Active Inactive Wound/Skin Impairment Nursing Diagnoses: Knowledge deficit related to ulceration/compromised skin integrity Goals: Patient/caregiver will verbalize understanding of skin care regimen Date Initiated: 08/20/2021 Target Resolution Date: 09/20/2021 Goal Status: Active Ulcer/skin breakdown will have a volume reduction of 30% by week 4 Date Initiated: 08/20/2021 Target Resolution Date: 09/20/2021 Goal Status: Active Ulcer/skin breakdown will have a volume reduction of 50% by week 8 Date Initiated: 08/20/2021 Target Resolution Date: 10/20/2021 Goal Status: Active Ulcer/skin breakdown will have a volume reduction of 80% by week 12 Date Initiated: 08/20/2021 Target Resolution Date: 11/20/2021 Goal Status: Active Ulcer/skin breakdown will heal within 14 weeks Date Initiated: 08/20/2021 Target Resolution Date: 12/20/2021 Goal Status: Active Interventions: Assess patient/caregiver ability to obtain necessary supplies Assess patient/caregiver ability to perform ulcer/skin care regimen upon admission and as needed Assess ulceration(s) every visit Notes: Electronic Signature(s) Signed: 12/04/2021 7:25:15 AM By: [name], BSN, RN, CWS, Kim RN, BSN Entered By: [name], BSN, RN, CWS, [name] on 12/03/2021 09:16:21 -------------------------------------------------------------------------------- Pain Assessment Details Patient Name: Date of Service: [name], Kelly Mons. 12/03/2021 8:45 A [name]: [id number] Patient Account Number: [id number] Date of Birth/Sex: Treating RN: 1975-03-25 (46 y.o. [name] Primary Care Cameryn Chrisley: [name] Other Clinician: Referring Inell Mimbs: Treating Rashia Mckesson/Extender: [name] in Treatment: 15 Active Problems Location of Pain Severity and Description of Pain Patient Has Paino No Site Locations MARLISHA, VANWYK [name] ([id number])  [id number] Page 5 of 7 Pain Management and Medication Current Pain Management: Notes Patient denies pain at this time. Electronic Signature(s) Signed: 12/04/2021 7:25:15 AM By: [name], BSN, RN, CWS, Kim RN, BSN Entered By: [name], BSN, RN, CWS, [name] on 12/03/2021 09:04:47 -------------------------------------------------------------------------------- Patient/Caregiver Education Details Patient Name: Date of Service: [name] 11/16/2023andnbsp8:45 A [name]: [id number] Patient Account Number: [id number] Date of Birth/Gender: Treating RN: Jul 21, 1975 (46 y.o. [name] Primary Care Physician: [name] Other Clinician: Referring Physician: Treating Physician/Extender: [name] in Treatment: 15 Education Assessment Education Provided To: Patient Education Topics Provided Offloading: Handouts: What is Offloadingo, Other: Patient to wear peg assist offloader Methods: Demonstration, Explain/Verbal Responses: State content correctly Pressure: Electronic Signature(s) Signed: 12/04/2021 7:25:15 AM By: [name], BSN, RN, CWS, Kim RN, BSN Entered By: [name], BSN, RN, CWS, [name] on 12/03/2021 09:41:42 Kelly Foot, [name] ([id number]) [id number] Page 6 of 7 -------------------------------------------------------------------------------- Wound Assessment Details Patient Name: Date of Service: [name], [place]  TTIE [name]. 12/03/2021 8:45 A [name]: [id number] Patient Account Number: [id number] Date of Birth/Sex: Treating RN: [birth year] (46 y.o. Charolette Forward, [name] Primary Care Jeremiah Curci: [name] Other Clinician: Referring Taggert Bozzi: Treating Katurah Karapetian/Extender: [name] in Treatment: 15 Wound Status Wound Number: 2 Primary Abscess Etiology: Wound Location: Posterior Foot Wound Status: Open Wounding Event: Trauma Comorbid Anemia, Asthma, History  of pressure wounds, Osteomyelitis, Date Acquired: 06/18/2021 History: Neuropathy Weeks Of Treatment: 15 Clustered Wound: No Photos Photo Uploaded By: [name], BSN, RN, CWS, [name] on 12/03/2021 09:43:21 Wound Measurements Length: (cm) 0.2 Width: (cm) 0.4 Depth: (cm) 0.1 Area: (cm) 0.063 Volume: (cm) 0.006 % Reduction in Area: 91.1% % Reduction in Volume: 97.9% Epithelialization: Medium (34-66%) Wound Description Classification: Full Thickness Without Exposed Suppor Exudate Amount: Medium Exudate Type: Serosanguineous Exudate Color: red, brown t Structures Foul Odor After Cleansing: No Slough/Fibrino No Wound Bed Granulation Amount: Large (67-100%) Exposed Structure Granulation Quality: Pink Fascia Exposed: No Necrotic Amount: None Present (0%) Fat Layer (Subcutaneous Tissue) Exposed: Yes Tendon Exposed: No Muscle Exposed: No Joint Exposed: No Bone Exposed: No Assessment Notes callus surrounding wound area removed Electronic Signature(s) Signed: 12/04/2021 7:25:15 AM By: [name], BSN, RN, CWS, Kim RN, BSN Entered By: [name], BSN, RN, CWS, [name] on 12/03/2021 09:25:18 Kelly Foot, [name] ([id number]) [id number] Page 7 of 7 -------------------------------------------------------------------------------- Vitals Details Patient Name: Date of Service: [name]. 12/03/2021 8:45 A [name]: [id number] Patient Account Number: [id number] Date of Birth/Sex: Treating RN: 1975-10-25 (46 y.o. Charolette Forward, [name] Primary Care Mardy Lucier: [name] Other Clinician: Referring Anaelle Dunton: Treating Elisabeth Strom/Extender: [name] in Treatment: 15 Vital Signs Time Taken: 09:03 Temperature (F): 98.8 Height (in): 67 Pulse (bpm): 85 Weight (lbs): 153 Respiratory Rate (breaths/min): 16 Body Mass Index (BMI): 24 Blood Pressure (mmHg): 117/80 Reference Range: 80 - 120 mg / dl Electronic Signature(s) Signed: 12/04/2021  7:25:15 AM By: [name], BSN, RN, CWS, Kim RN, BSN Entered By: [name], BSN, RN, CWS, [name] on 12/03/2021 09:03:47

## 2021-12-07 ENCOUNTER — Encounter: Payer: Medicare PPO | Admitting: Physician Assistant

## 2021-12-07 ENCOUNTER — Ambulatory Visit: Payer: Medicare PPO | Admitting: Psychology

## 2021-12-07 DIAGNOSIS — G600 Hereditary motor and sensory neuropathy: Secondary | ICD-10-CM | POA: Diagnosis not present

## 2021-12-07 DIAGNOSIS — Z89412 Acquired absence of left great toe: Secondary | ICD-10-CM | POA: Diagnosis not present

## 2021-12-07 DIAGNOSIS — L97512 Non-pressure chronic ulcer of other part of right foot with fat layer exposed: Secondary | ICD-10-CM | POA: Diagnosis not present

## 2021-12-07 DIAGNOSIS — M86671 Other chronic osteomyelitis, right ankle and foot: Secondary | ICD-10-CM | POA: Diagnosis not present

## 2021-12-07 DIAGNOSIS — L84 Corns and callosities: Secondary | ICD-10-CM | POA: Diagnosis not present

## 2021-12-07 DIAGNOSIS — Z89411 Acquired absence of right great toe: Secondary | ICD-10-CM | POA: Diagnosis not present

## 2021-12-07 DIAGNOSIS — G7100 Muscular dystrophy, unspecified: Secondary | ICD-10-CM | POA: Diagnosis not present

## 2021-12-07 NOTE — Progress Notes (Addendum)
[name], [name] ([id number]) [id number] Page 1 of 8 Visit Report for 12/07/2021 Arrival Information Details Patient Name: Date of Service: [name]. 12/07/2021 8:45 A [name]: [id number] Patient Account Number: [id number] Date of Birth/Sex: Treating RN: 11/11/1975 (46 y.o. [name] Primary Care Donelle Baba: [name] Other Clinician: Referring Meaghan Whistler: Treating Esdras Delair/Extender: [name] in Treatment: 15 Visit Information History Since Last Visit Added or deleted any medications: No Patient Arrived: Ambulatory Any new allergies or adverse reactions: No Arrival Time: 09:20 Had a fall or experienced change in No Accompanied By: self activities of daily living that may affect Transfer Assistance: None risk of falls: Patient Identification Verified: Yes Hospitalized since last visit: No Secondary Verification Process Completed: Yes Pain Present Now: No Patient Requires Transmission-Based Precautions: No Patient Has Alerts: Yes Patient Alerts: ABI R 1.15 02/04/21 ABI L 1.11 02/04/21 Electronic Signature(s) Signed: 12/07/2021 9:43:36 AM By: Rosalio Loud MSN RN CNS WTA Entered By: Rosalio Loud on 12/07/2021 09:43:36 -------------------------------------------------------------------------------- Clinic Level of Care Assessment Details Patient Name: Date of Service: [name], [name] 12/07/2021 8:45 A [name]: [id number] Patient Account Number: [id number] Date of Birth/Sex: Treating RN: Feb 08, 1975 (46 y.o. [name] Primary Care Brette Cast: [name] Other Clinician: Referring Zigmund Linse: Treating Neysa Arts/Extender: [name] in Treatment: 15 Clinic Level of Care Assessment Items TOOL 4 Quantity Score X- 1 0 Use when only an EandM is performed on FOLLOW-UP visit ASSESSMENTS - Nursing Assessment / Reassessment X- 1 10 Reassessment of  Co-morbidities (includes updates in patient status) X- 1 5 Reassessment of Adherence to Treatment Plan ASSESSMENTS - Wound and Skin A ssessment / Reassessment X - Simple Wound Assessment / Reassessment - one wound 1 5 Draughn, [name] ([id number]) [id number] Page 2 of 8 '[]'$  - 0 Complex Wound Assessment / Reassessment - multiple wounds '[]'$  - 0 Dermatologic / Skin Assessment (not related to wound area) ASSESSMENTS - Focused Assessment '[]'$  - 0 Circumferential Edema Measurements - multi extremities '[]'$  - 0 Nutritional Assessment / Counseling / Intervention '[]'$  - 0 Lower Extremity Assessment (monofilament, tuning fork, pulses) '[]'$  - 0 Peripheral Arterial Disease Assessment (using hand held doppler) ASSESSMENTS - Ostomy and/or Continence Assessment and Care '[]'$  - 0 Incontinence Assessment and Management '[]'$  - 0 Ostomy Care Assessment and Management (repouching, etc.) PROCESS - Coordination of Care X - Simple Patient / Family Education for ongoing care 1 15 '[]'$  - 0 Complex (extensive) Patient / Family Education for ongoing care X- 1 10 Staff obtains Programmer, systems, Records, T Results / Process Orders est '[]'$  - 0 Staff telephones HHA, Nursing Homes / Clarify orders / etc '[]'$  - 0 Routine Transfer to another Facility (non-emergent condition) '[]'$  - 0 Routine Hospital Admission (non-emergent condition) '[]'$  - 0 New Admissions / Biomedical engineer / Ordering NPWT Apligraf, etc. , '[]'$  - 0 Emergency Hospital Admission (emergent condition) X- 1 10 Simple Discharge Coordination '[]'$  - 0 Complex (extensive) Discharge Coordination PROCESS - Special Needs '[]'$  - 0 Pediatric / Minor Patient Management '[]'$  - 0 Isolation Patient Management '[]'$  - 0 Hearing / Language / Visual special needs '[]'$  - 0 Assessment of Community assistance (transportation, D/C planning, etc.) '[]'$  - 0 Additional assistance / Altered mentation '[]'$  - 0 Support Surface(s) Assessment (bed, cushion, seat,  etc.) INTERVENTIONS - Wound Cleansing / Measurement X - Simple Wound Cleansing - one wound 1 5 '[]'$  - 0 Complex Wound Cleansing - multiple wounds X- 1 5 Wound Imaging (  photographs - any number of wounds) '[]'$  - 0 Wound Tracing (instead of photographs) X- 1 5 Simple Wound Measurement - one wound '[]'$  - 0 Complex Wound Measurement - multiple wounds INTERVENTIONS - Wound Dressings X - Small Wound Dressing one or multiple wounds 1 10 '[]'$  - 0 Medium Wound Dressing one or multiple wounds '[]'$  - 0 Large Wound Dressing one or multiple wounds '[]'$  - 0 Application of Medications - topical '[]'$  - 0 Application of Medications - injection INTERVENTIONS - Miscellaneous '[]'$  - 0 External ear exam '[]'$  - 0 Specimen Collection (cultures, biopsies, blood, body fluids, etc.) '[]'$  - 0 Specimen(s) / Culture(s) sent or taken to Lab for analysis TIFFINEY, SPARROW ([id number]) [id number] Page 3 of 8 '[]'$  - 0 Patient Transfer (multiple staff / Civil Service fast streamer / Similar devices) '[]'$  - 0 Simple Staple / Suture removal (25 or less) '[]'$  - 0 Complex Staple / Suture removal (26 or more) '[]'$  - 0 Hypo / Hyperglycemic Management (close monitor of Blood Glucose) '[]'$  - 0 Ankle / Brachial Index (ABI) - do not check if billed separately X- 1 5 Vital Signs Has the patient been seen at the hospital within the last three years: Yes Total Score: 85 Level Of Care: New/Established - Level 3 Electronic Signature(s) Signed: 12/07/2021 4:59:20 PM By: Rosalio Loud MSN RN CNS WTA Entered By: Rosalio Loud on 12/07/2021 09:57:41 -------------------------------------------------------------------------------- Encounter Discharge Information Details Patient Name: Date of Service: [name], Kelly Grills [name]. 12/07/2021 8:45 A [name]: [id number] Patient Account Number: [id number] Date of Birth/Sex: Treating RN: 01/04/1976 (46 y.o. [name] Primary Care Kyleigha Markert: [name] Other  Clinician: Referring Idalys Konecny: Treating Tramaine Snell/Extender: [name] in Treatment: 15 Encounter Discharge Information Items Discharge Condition: Stable Ambulatory Status: Ambulatory Discharge Destination: Home Transportation: Ambulance Accompanied By: self Schedule Follow-up Appointment: Yes Clinical Summary of Care: Electronic Signature(s) Signed: 12/07/2021 4:59:20 PM By: Rosalio Loud MSN RN CNS WTA Entered By: Rosalio Loud on 12/07/2021 09:58:53 -------------------------------------------------------------------------------- Lower Extremity Assessment Details Patient Name: Date of Service: [name], Kelly Mons. 12/07/2021 8:45 A [name]: [id number] Patient Account Number: [id number] Date of Birth/Sex: Treating RN: 1975-12-24 (47 y.o. [name] Primary Care Trudie Cervantes: [name] Other Clinician: [name] ([id number]) [id number] Page 4 of 8 Referring Angelmarie Ponzo: Treating Kaoru Rezendes/Extender: [name] in Treatment: 15 Electronic Signature(s) Signed: 12/07/2021 9:44:31 AM By: Rosalio Loud MSN RN CNS WTA Entered By: Rosalio Loud on 12/07/2021 09:44:30 -------------------------------------------------------------------------------- Multi Wound Chart Details Patient Name: Date of Service: [name], Kelly Grills [name]. 12/07/2021 8:45 A [name]: [id number] Patient Account Number: [id number] Date of Birth/Sex: Treating RN: [birth year] (46 y.o. [name] Primary Care Laxmi Choung: [name] Other Clinician: Referring Erica Osuna: Treating Lurleen Soltero/Extender: [name] in Treatment: 15 Vital Signs Height(in): 67 Pulse(bpm): 80 Weight(lbs): 153 Blood Pressure(mmHg): 120/73 Body Mass Index(BMI): 24 Temperature(F): 98.8 Respiratory Rate(breaths/min): 16 [2:Photos:] [N/A:N/A] Plantar Foot N/A N/A Wound Location: Trauma N/A  N/A Wounding Event: Abscess N/A N/A Primary Etiology: Anemia, Asthma, History of pressure N/A N/A Comorbid History: wounds, Osteomyelitis, Neuropathy 06/18/2021 N/A N/A Date Acquired: 15 N/A N/A Weeks of Treatment: Open N/A N/A Wound Status: No N/A N/A Wound Recurrence: 0.2x0.2x0.2 N/A N/A Measurements L x W x D (cm) 0.031 N/A N/A A (cm) : rea 0.006 N/A N/A Volume (cm) : 95.60% N/A N/A % Reduction in Area: 97.90% N/A N/A % Reduction in Volume: Full Thickness Without Exposed N/A N/A Classification: Support  Structures Medium N/A N/A Exudate Amount: Serosanguineous N/A N/A Exudate Type: red, brown N/A N/A Exudate Color: Large (67-100%) N/A N/A Granulation Amount: Pink N/A N/A Granulation Quality: None Present (0%) N/A N/A Necrotic Amount: Fat Layer (Subcutaneous Tissue): Yes N/A N/A Exposed Structures: Fascia: No Tendon: No Muscle: No Joint: No Bone: No Medium (34-66%) N/A N/A Epithelialization: Kelly Busing ([id number]) [id number] Page 5 of 8 Treatment Notes Electronic Signature(s) Signed: 12/07/2021 4:59:20 PM By: Rosalio Loud MSN RN CNS WTA Previous Signature: 12/07/2021 9:44:49 AM Version By: Rosalio Loud MSN RN CNS WTA Entered By: Rosalio Loud on 12/07/2021 09:46:30 -------------------------------------------------------------------------------- Multi-Disciplinary Care Plan Details Patient Name: Date of Service: [name], Kelly Grills [name]. 12/07/2021 8:45 A [name]: [id number] Patient Account Number: [id number] Date of Birth/Sex: Treating RN: 1975/03/22 (46 y.o. [name] Primary Care Calayah Guadarrama: [name] Other Clinician: Referring Mir Fullilove: Treating Meshulem Onorato/Extender: [name] in Treatment: 15 Active Inactive Wound/Skin Impairment Nursing Diagnoses: Knowledge deficit related to ulceration/compromised skin integrity Goals: Patient/caregiver will verbalize  understanding of skin care regimen Date Initiated: 08/20/2021 Target Resolution Date: 09/20/2021 Goal Status: Active Ulcer/skin breakdown will have a volume reduction of 30% by week 4 Date Initiated: 08/20/2021 Target Resolution Date: 09/20/2021 Goal Status: Active Ulcer/skin breakdown will have a volume reduction of 50% by week 8 Date Initiated: 08/20/2021 Target Resolution Date: 10/20/2021 Goal Status: Active Ulcer/skin breakdown will have a volume reduction of 80% by week 12 Date Initiated: 08/20/2021 Target Resolution Date: 11/20/2021 Goal Status: Active Ulcer/skin breakdown will heal within 14 weeks Date Initiated: 08/20/2021 Target Resolution Date: 12/20/2021 Goal Status: Active Interventions: Assess patient/caregiver ability to obtain necessary supplies Assess patient/caregiver ability to perform ulcer/skin care regimen upon admission and as needed Assess ulceration(s) every visit Notes: Electronic Signature(s) Signed: 12/07/2021 9:44:37 AM By: Rosalio Loud MSN RN CNS WTA Entered By: Rosalio Loud on 12/07/2021 09:44:36 [name], [name] ([id number]) [id number] Page 6 of 8 -------------------------------------------------------------------------------- Pain Assessment Details Patient Name: Date of Service: [name]. 12/07/2021 8:45 A [name]: [id number] Patient Account Number: [id number] Date of Birth/Sex: Treating RN: 1975/10/08 (46 y.o. [name] Primary Care Kaziah Krizek: [name] Other Clinician: Referring Dmari Schubring: Treating Arias Weinert/Extender: [name] in Treatment: 15 Active Problems Location of Pain Severity and Description of Pain Patient Has Paino No Site Locations Pain Management and Medication Current Pain Management: Electronic Signature(s) Signed: 12/07/2021 9:43:45 AM By: Rosalio Loud MSN RN CNS WTA Entered By: Rosalio Loud on 12/07/2021  09:43:45 -------------------------------------------------------------------------------- Patient/Caregiver Education Details Patient Name: Date of Service: [name] 11/20/2023andnbsp8:45 A [name]: [id number] Patient Account Number: [id number] Date of Birth/Gender: Treating RN: [birth year] (46 y.o. [name] Primary Care Physician: [name] Other Clinician: Referring Physician: Treating Physician/Extender: [name] in Treatment: [address], [place] Jerilynn Mages ([id number]) [id number] Page 7 of 8 Education Assessment Education Provided To: Patient Education Topics Provided Wound/Skin Impairment: Handouts: Caring for Your Ulcer Methods: Explain/Verbal Responses: State content correctly Electronic Signature(s) Signed: 12/07/2021 4:59:20 PM By: Rosalio Loud MSN RN CNS WTA Entered By: Rosalio Loud on 12/07/2021 09:58:05 -------------------------------------------------------------------------------- Wound Assessment Details Patient Name: Date of Service: [name], Kelly Mons. 12/07/2021 8:45 A [name]: [id number] Patient Account Number: [id number] Date of Birth/Sex: Treating RN: June 18, 1975 (46 y.o. [name] Primary Care Alexica Schlossberg: [name] Other Clinician: Referring Carlton Sweaney: Treating Aleeha Boline/Extender: [name] in Treatment: 15 Wound Status Wound Number: 2 Primary Abscess  Etiology: Wound Location: Plantar Foot Wound Status: Open Wounding Event: Trauma Comorbid Anemia, Asthma, History of pressure wounds, Osteomyelitis, Date Acquired: 06/18/2021 History: Neuropathy Weeks Of Treatment: 15 Clustered Wound: No Photos Wound Measurements Length: (cm) 0.2 Width: (cm) 0.2 Depth: (cm) 0.2 Area: (cm) 0.031 Volume: (cm) 0.006 % Reduction in Area: 95.6% % Reduction in Volume: 97.9% Epithelialization: Medium (34-66%) Wound  Description Classification: Full Thickness Without Exposed Support Structures Exudate Amount: Medium Exudate Type: Serosanguineous [name], [name] ([id number]) Exudate Color: red, brown Foul Odor After Cleansing: No Slough/Fibrino No [id number] Page 8 of 8 Wound Bed Granulation Amount: Large (67-100%) Exposed Structure Granulation Quality: Pink Fascia Exposed: No Necrotic Amount: None Present (0%) Fat Layer (Subcutaneous Tissue) Exposed: Yes Tendon Exposed: No Muscle Exposed: No Joint Exposed: No Bone Exposed: No Treatment Notes Wound #2 (Foot) Wound Laterality: Plantar Cleanser Peri-Wound Care Topical Primary Dressing Secondary Dressing Secured With Compression Wrap Compression Stockings Add-Ons Electronic Signature(s) Signed: 12/07/2021 4:59:20 PM By: Rosalio Loud MSN RN CNS WTA Entered By: Rosalio Loud on 12/07/2021 09:27:11 -------------------------------------------------------------------------------- [place] Details Patient Name: Date of Service: Kelly Clock, Kelly TTIE [name]. 12/07/2021 8:45 A [name]: [id number] Patient Account Number: [id number] Date of Birth/Sex: Treating RN: Mar 26, 1975 (46 y.o. [name] Primary Care Elleigh Cassetta: [name] Other Clinician: Referring Deserae Jennings: Treating Emberlin Verner/Extender: [name] in Treatment: 15 Vital Signs Time Taken: 09:21 Temperature (F): 98.8 Height (in): 67 Pulse (bpm): 80 Weight (lbs): 153 Respiratory Rate (breaths/min): 16 Body Mass Index (BMI): 24 Blood Pressure (mmHg): 120/73 Reference Range: 80 - 120 mg / dl Electronic Signature(s) Signed: 12/07/2021 9:43:41 AM By: Rosalio Loud MSN RN CNS WTA Entered By: Rosalio Loud on 12/07/2021 09:43:40

## 2021-12-07 NOTE — Progress Notes (Signed)
FANI, ROTONDO ([id number]) [id number] Page 1 of 7 Visit Report for 12/07/2021 Chief Complaint Document Details Patient Name: Date of Service: [name]. 12/07/2021 8:45 A [name]: [id number] Patient Account Number: [id number] Date of Birth/Sex: Treating RN: 1975/11/05 (46 y.o. [name] Primary Care Provider: [name] Other Clinician: Referring Provider: Treating Provider/Extender: [name] in Treatment: 15 Information Obtained from: Patient Chief Complaint Right foot ulcer Electronic Signature(s) Signed: 12/07/2021 9:14:46 AM By: [name] Entered By: Worthy Keeler on 12/07/2021 09:14:46 -------------------------------------------------------------------------------- HPI Details Patient Name: Date of Service: [name], Kelly Mons. 12/07/2021 8:45 A [name]: [id number] Patient Account Number: [id number] Date of Birth/Sex: Treating RN: 05/16/1975 (46 y.o. [name] Primary Care Provider: [name] Other Clinician: Referring Provider: Treating Provider/Extender: [name] in Treatment: 15 History of Present Illness Location: right first metatarsal head plantar aspect Quality: Patient reports experiencing a dull pain to affected area(s). Severity: Patient states wound are getting worse. Duration: Patient has had the wound for > 2 months prior to seeking treatment at the wound center Timing: Pain in wound is Intermittent (comes and goes Context: The wound appeared gradually over time Modifying Factors: Other treatment(s) tried include:pain clinic for her narcotics ssociated Signs and Symptoms: Patient reports having difficulty standing for long periods. A HPI Description: 46 year old patient sent to [name] by her PCP [name] who saw her recently on 05/15/2015 for chronic pain related to Charcot [name] with her feet with a history of bilateral great toe amputations for osteomyelitis. Her notes were reviewed and there was extensive history of opioid treatment and management by the [facility] pain clinic from where she has been terminated. X-ray of the right foot foot was done on 03/13/2015 and was suggestive of active infection and possible osteomyelitis. her past surgical history includes amputation of the left big toe in 2011 and amputation of the right big toe in 2015. She was sitting the wound clinic at [facility] and we will try and obtain these notes. She is a smoker and smokes about 15 cigarettes a day. [name], [name] ([id number]) [id number] Page 2 of 7 06/12/2015 - x-ray of the right foot -- IMPRESSION:1. Soft tissue swelling of the plantar surface of the foot. No underlying acute abnormality. 2. Amputation deformity again of the right great toe. Old healed bony deformity noted the right second metatarsal. No acute bony abnormality identified. We have not received any reports from the [facility] hospital yet. She continues to smoke at least 10 cigarettes a day 06/19/2015 -- she has increasing pain in the right foot and also has had some low-grade fever. Her vascular test is scheduled for tomorrow and her MRIs not till next week. Readmission: 08-20-2021 upon evaluation today patient presents for initial inspection here in our clinic although she has been seen that was back in 2017. At that time she was having a issue with her right foot. With that being said upon inspection today she is actually having issues with the right foot as well on the medial aspect where she has some breakdown due to what I am assuming is pressure to the foot location. 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 I did review her culture  report which showed evidence of bacteria including hemolytic Streptococcus group A as well as Staphylococcus aureus. The patient is currently on doxycycline and tells me that this is getting much better. T opical gentamicin is also being utilized. Also did review her arterial studies which show that she has on the right and ABI of 1.15 which is triphasic and appears to be excellent there is no significant peripheral artery disease noted the left is also. With ABI of 1.11 and a good report in that regard as well. Lastly I did also review her MRI of the right forefoot which was performed on 07-06-2021. This shows that she does have mild subcortical marrow edema in the stump of the first metatarsal concerning for osteomyelitis. There is also a malunited fracture of the second metatarsal. The patient has bone marrow edema in the base of the second third fourth and fifth metatarsals concerning for stress reaction obviously this infection is going to be an issue for [name] here as well she needs to be treated appropriately and I do believe that the doxycycline is a good option right now although I may need to #1 extend this and #2 plan for even altering or adding to the treatment regimen depending on how things progress. Patient voiced understanding.Marland Kitchen 09-01-2021 upon evaluation today patient appears to be doing excellent in regard to her wound. She is actually showing signs of excellent improvement which is great news. Fortunately I do not see any signs of active infection locally or systemically at this point which is great news and overall I am extremely pleased with where things stand today. I do not see any evidence of active infection locally or systemically. 09-07-2021 upon evaluation today patient appears to be doing well with regard to her foot ulcer. This is actually showing signs of improvement which is great news. Fortunately there does not appear to be any signs of  active infection locally or systemically at this time which is as well. 09-22-2021 upon evaluation today patient appears to be doing well currently in regard to her wound. This is measuring smaller and looking better were slowly making progress here. Fortunately there does not appear to be any evidence of active infection locally or systemically at this time. Upon inspection patient's wound bed showed evidence of good granulation and epithelization at this point. 10-05-2021 upon evaluation today patient's wound shows a lot of callus buildup but the surface of the wound is actually doing quite well. She is going require some sharp debridement to clearway some of the necrotic debris. 10-19-2021 upon evaluation today patient appears to be doing better in regard to her wound which is actually measuring little bit smaller today. Fortunately there does not appear to be any signs of significant infection she does have quite a bit of callus we will get have to work on that today. Otherwise I think we are headed in the appropriate direction. 11-09-2021 upon evaluation today patient appears to be doing well in regard to her wound from the standpoint of infection and actually appears to be doing better she actually just completed the doxycycline. With that being said I am hopeful that this took care of the osteomyelitis although that something that we will definitely keep a close eye on. With that being said I do believe that she is still having tremendous amount of callus buildup which I believe in turn is a result of her having issues here with Friction and callus buildup. With that being said I do believe that the  patient would benefit here from a total contact cast I discussed that with her today. I do want to get more aggressive to get this closed as I think that if we do not then she is more likely to have a long standing issue here going forward. 11-17-2021 upon evaluation today patient appears to be doing  well although her wound is still open. I do think that she is ready to be placed in a total contact cast. She actually has come back on Thursday to have this changed out. Fortunately and overall I do not see any signs of worsening and I think that we should be able to get this on without any complication or problem.. 11-19-2021 upon evaluation today patient appears to be doing well currently in regard to her wound. She has been tolerating the dressing changes without complication and overall I am extremely pleased with where we stand today. I do not see any evidence of active infection locally or systemically at this time which is great news. I think the total contact cast did an awesome job for her. 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 Only a very small area remains on the plantar foot. I had to use illumination to even see anything that looked open here. Much improved 12-03-2021 upon evaluation today patient appears to be doing well currently in regard to her wound. Of note she tells me that she did end up cutting off her cast with a pair of kitchen shears 2 nights ago. She states that she has been walking on it more than she should have after the cast was put on due to a friend who ended up in the hospital that it caught her right after she had a put on that same day. She feels like she probably deformed the cast that it was causing some pressure points that she was afraid to get a blister and did not want another wound. For that reason she did go ahead and cut the soft and does not appear to have caused any damage. She tells me that it only took 7 minutes. With that being said I do think the wound actually looks really good she has a lot of callus buildup I am going to remove that just to make sure nothing side underneath but other than that overall she seems to be doing quite well. 12-07-2021 upon evaluation today patient's  wound is actually showing signs of excellent improvement and very pleased with where we stand I think we are headed in the right direction. Fortunately there does not appear to be any signs of active infection at this time which is great news. No fevers, chills, nausea, vomiting, or diarrhea. Electronic Signature(s) Signed: 12/07/2021 11:46:46 AM By: [name] Entered By: Worthy Keeler on 12/07/2021 11:46:46 Coad, [name] ([id number]) [id number] Page 3 of 7 -------------------------------------------------------------------------------- Physical Exam Details Patient Name: Date of Service: [name]. 12/07/2021 8:45 A [name]: [id number] Patient Account Number: [id number] Date of Birth/Sex: Treating RN: September 13, 1975 (46 y.o. Drema Pry Primary Care Provider: [name] Other Clinician: Referring Provider: Treating Provider/Extender: [name] in Treatment: 26 Constitutional Well-nourished and well-hydrated in no acute distress. Respiratory normal breathing without difficulty. Psychiatric this patient is able to make decisions and demonstrates good insight into disease process. Alert and Oriented x 3. pleasant and cooperative. Notes Upon inspection patient's wound bed showed evidence of really no  need for even sharp debridement there was just a very small opening still remaining at this point and overall I think she is doing quite well with the plan. She is using the peg assist offloading shoe which I think has been beneficial. Electronic Signature(s) Signed: 12/07/2021 11:47:28 AM By: [name] Entered By: Worthy Keeler on 12/07/2021 11:47:28 -------------------------------------------------------------------------------- Physician Orders Details Patient Name: Date of Service: [name], Kelly Mons. 12/07/2021 8:45 A [name]: [id number] Patient Account  Number: [id number] Date of Birth/Sex: Treating RN: 09-Nov-1975 (46 y.o. 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 12/07/2021 8:45 A [name]: [id number] Patient Account Number: [id number] Date of Birth/Sex: Treating RN: 1975-07-26 (46 y.o. 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 12/07/2021 8:45 A [name]: [id number] Patient Account Number: [id number] Date of Birth/Sex: Treating RN: 1975/01/30 (46 y.o. Drema Pry Primary Care Provider: [name] Other Clinician: [name] ([id number]) [id number] Page 5 of 7 Referring Provider: Treating Provider/Extender: [name] in Treatment: 15 Subjective Chief Complaint Information obtained from Patient Right foot ulcer History of Present Illness (HPI) The following HPI elements were documented for the patient's wound: Location: right first metatarsal head plantar aspect Quality: Patient reports experiencing a dull pain to affected area(s). Severity: Patient states wound are getting worse. Duration: Patient has had the wound for > 2 months prior to seeking treatment at the wound center Timing: Pain in wound is Intermittent (comes and goes Context: The wound appeared gradually over time Modifying Factors: Other treatment(s) tried include:pain clinic for her narcotics Associated Signs and Symptoms: Patient reports having difficulty standing for long periods. 46 year old patient sent to [name] by her PCP 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 Her notes were reviewed and there was extensive history of opioid treatment and management by the [facility] pain clinic from where she has been terminated. X-ray of the right foot foot was done on 03/13/2015 and was suggestive of active infection  and possible osteomyelitis. her past surgical history includes amputation of the left big toe in 2011 and amputation of the right big  toe in 2015. She was sitting the wound clinic at [facility] and we will try and obtain these notes. She is a smoker and smokes about 15 cigarettes a day. 06/12/2015 - x-ray of the right foot -- IMPRESSION:1. Soft tissue swelling of the plantar surface of the foot. No underlying acute abnormality. 2. Amputation deformity again of the right great toe. Old healed bony deformity noted the right second metatarsal. No acute bony abnormality identified. We have not received any reports from the [facility] hospital yet. She continues to smoke at least 10 cigarettes a day 06/19/2015 -- she has increasing pain in the right foot and also has had some low-grade fever. Her vascular test is scheduled for tomorrow and her MRIs not till next week. Readmission: 08-20-2021 upon evaluation today patient presents for initial inspection here in our clinic although she has been seen that was back in 2017. At that time she was having a issue with her right foot. With that being said upon inspection today she is actually having issues with the right foot as well on the medial aspect where she has some breakdown due to what I am assuming is pressure to the foot location. 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 I did review her culture report which showed evidence of bacteria including hemolytic Streptococcus group A as well as Staphylococcus aureus. The patient is currently on doxycycline and tells me that this is getting much better. T opical gentamicin is also being utilized. Also did review her arterial studies which show that she has on the right and ABI of 1.15 which is triphasic and appears to be excellent there is no significant peripheral artery disease noted the left is also. With ABI of 1.11 and a good report in that regard as well. Lastly I did  also review her MRI of the right forefoot which was performed on 07-06-2021. This shows that she does have mild subcortical marrow edema in the stump of the first metatarsal concerning for osteomyelitis. There is also a malunited fracture of the second metatarsal. The patient has bone marrow edema in the base of the second third fourth and fifth metatarsals concerning for stress reaction obviously this infection is going to be an issue for [name] here as well she needs to be treated appropriately and I do believe that the doxycycline is a good option right now although I may need to #1 extend this and #2 plan for even altering or adding to the treatment regimen depending on how things progress. Patient voiced understanding.Marland Kitchen 09-01-2021 upon evaluation today patient appears to be doing excellent in regard to her wound. She is actually showing signs of excellent improvement which is great news. Fortunately I do not see any signs of active infection locally or systemically at this point which is great news and overall I am extremely pleased with where things stand today. I do not see any evidence of active infection locally or systemically. 09-07-2021 upon evaluation today patient appears to be doing well with regard to her foot ulcer. This is actually showing signs of improvement which is great news. Fortunately there does not appear to be any signs of active infection locally or systemically at this  time which is as well. 09-22-2021 upon evaluation today patient appears to be doing well currently in regard to her wound. This is measuring smaller and looking better were slowly making progress here. Fortunately there does not appear to be any evidence of active infection locally or systemically at this time. Upon inspection patient's wound bed showed evidence of good granulation and epithelization at this point. 10-05-2021 upon evaluation today patient's wound shows a lot of callus buildup but the surface of the  wound is actually doing quite well. She is going require some sharp debridement to clearway some of the necrotic debris. 10-19-2021 upon evaluation today patient appears to be doing better in regard to her wound which is actually measuring little bit smaller today. Fortunately there does not appear to be any signs of significant infection she does have quite a bit of callus we will get have to work on that today. Otherwise I think we are headed in the appropriate direction. 11-09-2021 upon evaluation today patient appears to be doing well in regard to her wound from the standpoint of infection and actually appears to be doing better she actually just completed the doxycycline. With that being said I am hopeful that this took care of the osteomyelitis although that something that we will definitely keep a close eye on. With that being said I do believe that she is still having tremendous amount of callus buildup which I believe in turn is a result of her having issues here with Friction and callus buildup. With that being said I do believe that the patient would benefit here from a total contact cast I discussed that with her today. I do want to get more aggressive to get this closed as I think that if we do not then she is more likely to have a long standing issue here going forward. 11-17-2021 upon evaluation today patient appears to be doing well although her wound is still open. I do think that she is ready to be placed in a total contact cast. She actually has come back on Thursday to have this changed out. Fortunately and overall I do not see any signs of worsening and I think that we should be able to get this on without any complication or problem.. 11-19-2021 upon evaluation today patient appears to be doing well currently in regard to her wound. She has been tolerating the dressing changes without complication and overall I am extremely pleased with where we stand today. I do not see any  evidence of active infection locally or systemically at this time which is great news. I think the total contact cast did an awesome job for her. 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 Only a very small area remains on the plantar foot. I had to use illumination to even see anything that looked open here. Much improved 12-03-2021 upon evaluation today patient appears to be doing well currently in regard to her wound. Of note she tells me that she did end up cutting off her [name], [name] ([id number]) [id number] Page 6 of 7 cast with a pair of kitchen shears 2 nights ago. She states that she has been walking on it more than she should have after the cast was put on due to a friend who ended up in the hospital that it caught her right after she had a put on that same day. She feels like she probably deformed the cast that it was causing  some pressure points that she was afraid to get a blister and did not want another wound. For that reason she did go ahead and cut the soft and does not appear to have caused any damage. She tells me that it only took 7 minutes. With that being said I do think the wound actually looks really good she has a lot of callus buildup I am going to remove that just to make sure nothing side underneath but other than that overall she seems to be doing quite well. 12-07-2021 upon evaluation today patient's wound is actually showing signs of excellent improvement and very pleased with where we stand I think we are headed in the right direction. Fortunately there does not appear to be any signs of active infection at this time which is great news. No fevers, chills, nausea, vomiting, or diarrhea. Objective Constitutional Well-nourished and well-hydrated in no acute distress. Vitals Time Taken: 9:21 AM, Height: 67 in, Weight: 153 lbs, BMI: 24, Temperature: 98.8 F, Pulse: 80  bpm, Respiratory Rate: 16 breaths/min, Blood Pressure: 120/73 mmHg. Respiratory normal breathing without difficulty. Psychiatric this patient is able to make decisions and demonstrates good insight into disease process. Alert and Oriented x 3. pleasant and cooperative. General Notes: Upon inspection patient's wound bed showed evidence of really no need for even sharp debridement there was just a very small opening still remaining at this point and overall I think she is doing quite well with the plan. She is using the peg assist offloading shoe which I think has been beneficial. Integumentary (Hair, Skin) Wound #2 status is Open. Original cause of wound was Trauma. The date acquired was: 06/18/2021. The wound has been in treatment 15 weeks. The wound is located on the Plantar Foot. The wound measures 0.2cm length x 0.2cm width x 0.2cm depth; 0.031cm^2 area and 0.006cm^3 volume. There is Fat Layer (Subcutaneous Tissue) exposed. There is a medium amount of serosanguineous drainage noted. There is large (67-100%) pink granulation within the wound bed. There is no necrotic tissue within the wound bed. Assessment Active Problems ICD-10 Other chronic osteomyelitis, right ankle and foot Non-pressure chronic ulcer of other part of right foot with fat layer exposed Muscular dystrophy, unspecified Hereditary motor and sensory neuropathy Corns and callosities Plan 1. Based on what I am seeing I do believe that the patient should continue with the silver collagen followed by the gentamicin topically in order to help with the healing. 2. I am also can recommend she continue with a peg assist offloading shoe. 3. I would also suggest that she continue to monitor for any signs of worsening or infection if anything changes she knows that she can contact the office and let me know. We will see patient back for reevaluation in 1 week here in the clinic. If anything worsens or changes patient will contact our  office for additional recommendations. Electronic Signature(s) Signed: 12/07/2021 11:47:59 AM By: [name] Entered By: Worthy Keeler on 12/07/2021 11:47:58 Bruna, Joniece [name] ([id number]) [id number] Page 7 of 7 -------------------------------------------------------------------------------- SuperBill Details Patient Name: Date of Service: [name]. 12/07/2021 Medical Record Number: [id number] Patient Account Number: [id number] Date of Birth/Sex: Treating RN: [id number] (46 y.o. Drema Pry Primary Care Provider: [name] Other Clinician: Referring Provider: Treating Provider/Extender: [name] in Treatment: 15 Diagnosis Coding ICD-10 Codes Code Description [id number] Other chronic osteomyelitis, right ankle and foot L97.512 Non-pressure chronic ulcer of other part of right foot with fat layer  exposed G71.00 Muscular dystrophy, unspecified G60.0 Hereditary motor and sensory neuropathy L84 Corns and callosities Facility Procedures : CPT4 Code: 28786767 Description: 20947 - WOUND CARE VISIT-LEV 3 EST PT Modifier: Quantity: 1 Physician Procedures : CPT4 Code Description Modifier [id number] - WC PHYS LEVEL 3 - EST PT ICD-10 Diagnosis Description M86.671 Other chronic osteomyelitis, right ankle and foot L97.512 Non-pressure chronic ulcer of other part of right foot with fat layer exposed G71.00  Muscular dystrophy, unspecified G60.0 Hereditary motor and sensory neuropathy Quantity: 1 Electronic Signature(s) Signed: 12/07/2021 11:48:25 AM By: [name] Entered By: Worthy Keeler on 12/07/2021 11:48:25

## 2021-12-08 ENCOUNTER — Ambulatory Visit: Payer: Medicare PPO | Admitting: Physician Assistant

## 2021-12-08 ENCOUNTER — Telehealth: Payer: Self-pay

## 2021-12-08 NOTE — Telephone Encounter (Signed)
Patient called and is out of her escitalopram (LEXAPRO) 20 MG tablet . Please send to [facility] in [place].

## 2021-12-08 NOTE — Telephone Encounter (Signed)
Please call her and get her scheduled for a follow-up. It has been over a year since she has been seen. Once she is scheduled I will refill to cover until she can be seen.

## 2021-12-08 NOTE — Telephone Encounter (Signed)
I called the patient and informed her that the [facility] [place] on [facility]/04 and she stated she would call the pharmacy.  Firas Guardado,cma

## 2021-12-13 ENCOUNTER — Encounter: Payer: Self-pay | Admitting: Family Medicine

## 2021-12-13 ENCOUNTER — Other Ambulatory Visit: Payer: Self-pay | Admitting: Family Medicine

## 2021-12-13 DIAGNOSIS — F32A Depression, unspecified: Secondary | ICD-10-CM

## 2021-12-13 DIAGNOSIS — F419 Anxiety disorder, unspecified: Secondary | ICD-10-CM

## 2021-12-14 ENCOUNTER — Other Ambulatory Visit (HOSPITAL_COMMUNITY): Payer: Self-pay

## 2021-12-14 ENCOUNTER — Telehealth: Payer: Self-pay

## 2021-12-14 MED ORDER — BUSPIRONE HCL 7.5 MG PO TABS
7.5000 mg | ORAL_TABLET | Freq: Three times a day (TID) | ORAL | 0 refills | Status: DC
  Filled 2021-12-14: qty 270, 90d supply, fill #0

## 2021-12-14 MED ORDER — ESCITALOPRAM OXALATE 20 MG PO TABS: 20.0000 mg | ORAL_TABLET | Freq: Every day | ORAL | 0 refills | Status: DC

## 2021-12-14 NOTE — Telephone Encounter (Signed)
I will refill so she does not run out though she needs to be seen for further refills.

## 2021-12-14 NOTE — Telephone Encounter (Signed)
Patient is scheduled for a visit on Friday 12/18/21 at 8:00. Patient needs a refill of Lexapro.

## 2021-12-14 NOTE — Telephone Encounter (Signed)
I sent the refill in for the patient. She needs to be scheduled with me for follow-up. I believe there is a slot on Thursday that could be opened for her. Thanks.

## 2021-12-15 NOTE — Telephone Encounter (Signed)
Patient is scheduled for Friday 12/01 at 8:00.

## 2021-12-15 NOTE — Telephone Encounter (Signed)
I sent it in yesterday.

## 2021-12-16 NOTE — Telephone Encounter (Signed)
Noted. Refill sent in previously.

## 2021-12-17 NOTE — Telephone Encounter (Signed)
Patient is scheduled for 12/01 at 8:00

## 2021-12-18 ENCOUNTER — Ambulatory Visit (INDEPENDENT_AMBULATORY_CARE_PROVIDER_SITE_OTHER): Payer: Medicare PPO | Admitting: Family Medicine

## 2021-12-18 ENCOUNTER — Other Ambulatory Visit (HOSPITAL_COMMUNITY): Payer: Self-pay

## 2021-12-18 ENCOUNTER — Telehealth: Payer: Self-pay

## 2021-12-18 ENCOUNTER — Encounter: Payer: Self-pay | Admitting: Family Medicine

## 2021-12-18 VITALS — BP 118/76 | HR 89 | Temp 99.2°F | Ht 79.0 in | Wt 154.4 lb

## 2021-12-18 DIAGNOSIS — E559 Vitamin D deficiency, unspecified: Secondary | ICD-10-CM | POA: Diagnosis not present

## 2021-12-18 DIAGNOSIS — L97519 Non-pressure chronic ulcer of other part of right foot with unspecified severity: Secondary | ICD-10-CM | POA: Diagnosis not present

## 2021-12-18 DIAGNOSIS — Z1211 Encounter for screening for malignant neoplasm of colon: Secondary | ICD-10-CM

## 2021-12-18 DIAGNOSIS — K5900 Constipation, unspecified: Secondary | ICD-10-CM

## 2021-12-18 DIAGNOSIS — F419 Anxiety disorder, unspecified: Secondary | ICD-10-CM | POA: Diagnosis not present

## 2021-12-18 DIAGNOSIS — Z720 Tobacco use: Secondary | ICD-10-CM

## 2021-12-18 DIAGNOSIS — Z23 Encounter for immunization: Secondary | ICD-10-CM | POA: Diagnosis not present

## 2021-12-18 DIAGNOSIS — Z13220 Encounter for screening for lipoid disorders: Secondary | ICD-10-CM

## 2021-12-18 DIAGNOSIS — E611 Iron deficiency: Secondary | ICD-10-CM | POA: Diagnosis not present

## 2021-12-18 DIAGNOSIS — F32A Depression, unspecified: Secondary | ICD-10-CM

## 2021-12-18 MED ORDER — ESCITALOPRAM OXALATE 20 MG PO TABS: 20.0000 mg | ORAL_TABLET | Freq: Every day | ORAL | 0 refills | Status: DC

## 2021-12-18 MED ORDER — POLYETHYLENE GLYCOL 3350 17 GM/SCOOP PO POWD: 17.0000 g | Freq: Every day | ORAL | 0 refills | Status: AC | PRN

## 2021-12-18 MED ORDER — BUSPIRONE HCL 7.5 MG PO TABS: 7.5000 mg | ORAL_TABLET | Freq: Three times a day (TID) | ORAL | 0 refills | Status: DC

## 2021-12-18 NOTE — Addendum Note (Signed)
Addended by: Leeanne Rio on: 12/18/2021 09:51 AM   Modules accepted: Orders

## 2021-12-18 NOTE — Assessment & Plan Note (Signed)
She will continue to see wound care.

## 2021-12-18 NOTE — Progress Notes (Signed)
[name], MD Phone: [phone]  [name] is a 46 y.o. female who presents today for follow-up.  Anxiety/depression: Patient notes this is better.  She restarted Lexapro recently though has not restarted the BuSpar.  She was out of both of those due to refill ears from the office.  She notes her SSRI withdrawal symptoms have slowly been getting better.  She notes no SI or HI.  She continues to see her therapist.  She is back in school at this time.  Right foot ulcer: Patient reports this is about healed up.  She is seeing wound care.  She has a bandage in place today.  Constipation: This is a more recent issue.  She has a bowel movement every 3 days and has to strain to pass hard small balls of stool.  No blood in her stool.  He does get some abdominal cramping with this.  No nausea or vomiting.  She has tried Dulcolax.  She has an enema at home though has not tried this yet.  Social History   Tobacco Use  Smoking Status Every Day   Types: Cigarettes  Smokeless Tobacco Never    Current Outpatient Medications on File Prior to Visit  Medication Sig Dispense Refill   albuterol (VENTOLIN HFA) 108 (90 Base) MCG/ACT inhaler INHALE 2 PUFFS EVERY 6 HOURS AS NEEDED FOR WHEEZE OR SHORTNESS OF BREATH 18 each 1   benzonatate (TESSALON) 100 MG capsule Take 1 capsule (100 mg total) by mouth 2 (two) times daily as needed for cough. 20 capsule 0   diclofenac Sodium (VOLTAREN) 1 % GEL Apply 2 g topically 4 (four) times daily. Prn 150 g 2   doxycycline (VIBRA-TABS) 100 MG tablet Take 1 tablet (100 mg total) by mouth 2 (two) times daily. 20 tablet 0   fluticasone (FLONASE) 50 MCG/ACT nasal spray Place 2 sprays into both nostrils daily. 16 g 6   gentamicin cream (GARAMYCIN) 0.1 % Apply 1 Application topically 2 (two) times daily. 30 g 1   guaiFENesin (ROBITUSSIN) 100 MG/5ML liquid Take 5 mLs by mouth every 6 (six) hours as needed for cough or to loosen phlegm. 120 mL 0    lidocaine-prilocaine (EMLA) cream Apply 1 Application topically as needed. 30 g 1   meloxicam (MOBIC) 7.5 MG tablet      oxybutynin (DITROPAN-XL) 10 MG 24 hr tablet Take 1 tablet (10 mg total) by mouth daily. 30 tablet 11   No current facility-administered medications on file prior to visit.     ROS see history of present illness  Objective  Physical Exam Vitals:   12/18/21 0843  BP: 118/76  Pulse: 89  Temp: 99.2 F (37.3 C)  SpO2: 99%    BP Readings from Last 3 Encounters:  12/18/21 118/76  12/22/20 116/78  06/12/20 130/82   Wt Readings from Last 3 Encounters:  12/18/21 154 lb 6.4 oz (70 kg)  12/22/20 158 lb 9.6 oz (71.9 kg)  06/12/20 158 lb 9.6 oz (71.9 kg)    Physical Exam Constitutional:      General: She is not in acute distress.    Appearance: She is not diaphoretic.  Cardiovascular:     Rate and Rhythm: Normal rate and regular rhythm.     Heart sounds: Normal heart sounds.  Pulmonary:     Effort: Pulmonary effort is normal.     Breath sounds: Normal breath sounds.  Abdominal:     General: Bowel sounds are normal. There is no distension.  Palpations: Abdomen is soft.     Tenderness: There is no abdominal tenderness.  Skin:    General: Skin is warm and dry.  Neurological:     Mental Status: She is alert.      Assessment/Plan: Please see individual problem list.  Problem List Items Addressed This Visit     Anxiety and depression - Primary (Chronic)    Improved.  I apologize for the error with her medications.  These have been sent to the appropriate pharmacy.  She will continue Lexapro 20 mg daily and buspirone 7.5 mg 3 times daily.      Relevant Medications   busPIRone (BUSPAR) 7.5 MG tablet   escitalopram (LEXAPRO) 20 MG tablet   Current tobacco use (Chronic)    Prevnar 20 given.  Encourage smoking cessation.        Ulcer of right foot ([place]) (Chronic)    She will continue to see wound care.      Vitamin D deficiency (Chronic)     Check vitamin D.      Relevant Orders   Vitamin D (25 hydroxy)   Constipation    Possibly related to oxybutynin.  We will have her try MiraLAX 1 capful once daily.  If this is not beneficial by middle of next week she will let me know.  We may need to consider switching her from oxybutynin to something different.      Relevant Medications   polyethylene glycol powder (GLYCOLAX/MIRALAX) 17 GM/SCOOP powder   Other Relevant Orders   TSH   Iron deficiency    Check lab work.      Relevant Orders   IBC + Ferritin   CBC   Other Visit Diagnoses     Anxiety disorder, unspecified       Relevant Medications   busPIRone (BUSPAR) 7.5 MG tablet   escitalopram (LEXAPRO) 20 MG tablet   Lipid screening       Relevant Orders   Comp Met (CMET)   Lipid panel   Colon cancer screening       Relevant Orders   Ambulatory referral to Gastroenterology       Return in about 6 months (around 06/19/2022).   [name], MD [place]

## 2021-12-18 NOTE — Assessment & Plan Note (Signed)
Prevnar 20 given.  Encourage smoking cessation.

## 2021-12-18 NOTE — Telephone Encounter (Signed)
Attempted to call Patient (no answer/vm is full) to schedule her for labs  ASAP that she was supposed to get today and a 6 month follow up with Dr. Caryl Bis.

## 2021-12-18 NOTE — Assessment & Plan Note (Signed)
Check lab work.

## 2021-12-18 NOTE — Assessment & Plan Note (Signed)
Possibly related to oxybutynin.  We will have her try MiraLAX 1 capful once daily.  If this is not beneficial by middle of next week she will let me know.  We may need to consider switching her from oxybutynin to something different.

## 2021-12-18 NOTE — Assessment & Plan Note (Signed)
Improved.  I apologize for the error with her medications.  These have been sent to the appropriate pharmacy.  She will continue Lexapro 20 mg daily and buspirone 7.5 mg 3 times daily.

## 2021-12-18 NOTE — Assessment & Plan Note (Signed)
Check vitamin D. 

## 2021-12-18 NOTE — Patient Instructions (Signed)
Nice to see you. Please try the MiraLAX for your constipation.  If it is not beneficial please let us know. Please quit smoking. GI should contact you to set up a colonoscopy. Will call you with your lab results.

## 2021-12-21 ENCOUNTER — Ambulatory Visit (INDEPENDENT_AMBULATORY_CARE_PROVIDER_SITE_OTHER): Payer: Medicare PPO | Admitting: Psychology

## 2021-12-21 ENCOUNTER — Ambulatory Visit: Payer: Medicare PPO | Admitting: Physician Assistant

## 2021-12-21 DIAGNOSIS — F331 Major depressive disorder, recurrent, moderate: Secondary | ICD-10-CM

## 2021-12-21 NOTE — Progress Notes (Signed)
Luara Sueann Brownley is a 46 y.o. female patient .  Diagnosis 296.30 (Major depressive affective disorder, recurrent episode, unspecified) [n/a]  Symptoms Depressed or irritable mood. (Status: maintained) -- No Description Entered  Diminished interest in or enjoyment of activities. (Status: maintained) -- No Description Entered  Feelings of hopelessness, worthlessness, or inappropriate guilt. (Status: maintained) -- No Description Entered  Lack of energy. (Status: maintained) -- No Description Entered  Unresolved grief issues. (Status: maintained) -- No Description Entered  Medication Status compliance  Safety none  If Suicidal or Homicidal State Action Taken: unspecified  Current Risk: low Medications Hydroxyzine (Dosage: unknown)  Remiron (Dosage: unknown)  Sertraline (Dosage: unknown)  Objectives Related Problem: Appropriately grieve the loss in order to normalize mood and to return to previously adaptive level of functioning. Description: Increasingly verbalize hopeful and positive statements regarding self, others, and the future. Target Date: 2022-07-15 Frequency: Daily Modality: individual Progress: 90%  Related Problem: Appropriately grieve the loss in order to normalize mood and to return to previously adaptive level of functioning. Description: Learn and implement conflict resolution skills to resolve interpersonal problems. Target Date: 2022-07-15 Frequency: Daily Modality: individual Progress: 85%  Related Problem: Appropriately grieve the loss in order to normalize mood and to return to previously adaptive level of functioning. Description: Learn and implement problem-solving and decision-making skills. Target Date: 2022-07-15 Frequency: Daily Modality: individual Progress: 85%  Related Problem: Appropriately grieve the loss in order to normalize mood and to return to previously adaptive level of  functioning. Description: Describe current and past experiences with depression including their impact on functioning and attempts to resolve it. Target Date: 2021-07-14 Frequency: Daily Modality: individual Progress: 100%-completed  Related Problem: Complete the process of letting go of the lost significant other. Description: Report decreased time spent each day focusing on the loss. Target Date: 2022-07-15 Frequency: Daily Modality: individual Progress: 85%  Related Problem: Complete the process of letting go of the lost significant other. Description: Decrease unrealistic thoughts, statements, and feelings of being responsible for the loss. Target Date: 2021-07-14 Frequency: Daily Modality: individual Progress: 100%-completed  Related Problem: Complete the process of letting go of the lost significant other. Description: Verbalize and resolve feelings of anger or guilt focused on self or deceased loved one that interfere with the grieving process. Target Date: 2021-07-14 Frequency: Daily Modality: individual Progress: 100%-completed  Related Problem: Complete the process of letting go of the lost significant other. Description: Begin verbalizing feelings associated with the loss. Target Date: 2022-07-15 Frequency: Daily Modality: individual Progress: 80%  Related Problem: Complete the process of letting go of the lost significant other. Description: Identify what stages of grief have been experienced in the continuum of the grieving process. Target Date: 2021-07-14 Frequency: Daily Modality: individual Progress: 100%-completed  Related Problem: Complete the process of letting go of the lost significant other. Description: Participate in a therapy that addresses issues beyond grief that have arisen as a result of the loss. Target Date: 2022-07-15 Frequency: Daily Modality: individual Progress: 85%  Related Problem: Complete the process of letting go of the lost  significant other. Description: Tell in detail the story of the current loss that is triggering symptoms. Target Date: 2021-07-14 Frequency: Daily Modality: individual Progress: 100%-completed  Client Response full compliance  Service Location Location, [address] [place], [place] [address]  Service  Code cpt [id number]  Validate/empathize  Related past to present  Behavioral activation plan  Facilitate problem solving  Identify automatic thoughts  Rationally challenge thoughts or beliefs/cognitive restructuring  Identify/label emotions  Emotion regulation skills  Psychiatrist  Self care activities  Self-monitoring  Session Notes: Patient requests a video Webex session.. She is at home and I am at my home office.  Dx: Major Depression  Meds: Lexapro ('20mg'$ ), Busperone (7.5)  Goals: States that she is seeking counseling to learn to manage the grief reaction to losing her husband. She reports feeling tired all the time and that she has no energy or motivation. Needs to develop coping strategies. Therapy to focus on reducing despair, sadness, hopelessness and helplessness. 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 Will explore those early traumas/losses and current triggers. Goal dates: 04-2021 Revised Goals:6-24 Patient states that she has signed up to go to college on line to get a BS in [employer]. Her family does not take it very serious and are not supportive in that they don't understand that she needs time to get her work done. Her family never finished school and they do not fully understand the level of commitment needed and the time to complete work. She is enthusiastic and feeling proud that she is taking this important step.  She said she got upset with her doctor because she contacted the office that she needed her prescription for her Lexapro and they did not respond. She finally got them to respond,  but only after she got angry. We talked about setting boundaries and making priorities, especially with family.                                       [name], PhD Time: 2:42P-5:36R 45 minutes

## 2021-12-28 ENCOUNTER — Ambulatory Visit: Payer: Medicare PPO | Admitting: Physician Assistant

## 2021-12-29 ENCOUNTER — Telehealth: Payer: Medicare PPO | Admitting: Family Medicine

## 2021-12-29 DIAGNOSIS — J208 Acute bronchitis due to other specified organisms: Secondary | ICD-10-CM | POA: Diagnosis not present

## 2021-12-29 MED ORDER — PREDNISONE 20 MG PO TABS: 40.0000 mg | ORAL_TABLET | Freq: Every day | ORAL | 0 refills | Status: AC

## 2021-12-29 MED ORDER — PSEUDOEPH-BROMPHEN-DM 30-2-10 MG/5ML PO SYRP: 5.0000 mL | ORAL_SOLUTION | Freq: Four times a day (QID) | ORAL | 0 refills | Status: DC | PRN

## 2021-12-29 MED ORDER — FLUTICASONE PROPIONATE 50 MCG/ACT NA SUSP: 2.0000 | Freq: Every day | NASAL | 0 refills | Status: DC

## 2021-12-29 NOTE — Patient Instructions (Signed)
[name], thank you for joining Perlie Mayo, NP for today's virtual visit.  While this provider is not your primary care provider (PCP), if your PCP is located in our provider database this encounter information will be shared with them immediately following your visit.   [facility] account gives you access to today's visit and all your visits, tests, and labs performed at [facility] " click here if you don't have a [facility] account or go to [email or web address]  Consent: (Patient) [name] provided verbal consent for this virtual visit at the beginning of the encounter.  Current Medications:  Current Outpatient Medications:    brompheniramine-pseudoephedrine-DM 30-2-10 MG/5ML syrup, Take 5 mLs by mouth 4 (four) times daily as needed., Disp: 120 mL, Rfl: 0   fluticasone (FLONASE) 50 MCG/ACT nasal spray, Place 2 sprays into both nostrils daily., Disp: 16 g, Rfl: 0   predniSONE (DELTASONE) 20 MG tablet, Take 2 tablets (40 mg total) by mouth daily with breakfast for 5 days., Disp: 10 tablet, Rfl: 0   albuterol (VENTOLIN HFA) 108 (90 Base) MCG/ACT inhaler, INHALE 2 PUFFS EVERY 6 HOURS AS NEEDED FOR WHEEZE OR SHORTNESS OF BREATH, Disp: 18 each, Rfl: 1   benzonatate (TESSALON) 100 MG capsule, Take 1 capsule (100 mg total) by mouth 2 (two) times daily as needed for cough. (Patient not taking: Reported on 12/18/2021), Disp: 20 capsule, Rfl: 0   busPIRone (BUSPAR) 7.5 MG tablet, Take 1 tablet (7.5 mg total) by mouth 3 (three) times daily., Disp: 270 tablet, Rfl: 0   diclofenac Sodium (VOLTAREN) 1 % GEL, Apply 2 g topically 4 (four) times daily. Prn, Disp: 150 g, Rfl: 2   doxycycline (VIBRA-TABS) 100 MG tablet, Take 1 tablet (100 mg total) by mouth 2 (two) times daily., Disp: 20 tablet, Rfl: 0   escitalopram (LEXAPRO) 20 MG tablet, Take 1 tablet (20 mg total) by mouth daily., Disp: 90 tablet, Rfl: 0   gentamicin cream (GARAMYCIN) 0.1 %,  Apply 1 Application topically 2 (two) times daily., Disp: 30 g, Rfl: 1   guaiFENesin (ROBITUSSIN) 100 MG/5ML liquid, Take 5 mLs by mouth every 6 (six) hours as needed for cough or to loosen phlegm. (Patient not taking: Reported on 12/18/2021), Disp: 120 mL, Rfl: 0   lidocaine-prilocaine (EMLA) cream, Apply 1 Application topically as needed., Disp: 30 g, Rfl: 1   meloxicam (MOBIC) 7.5 MG tablet, , Disp: , Rfl:    oxybutynin (DITROPAN-XL) 10 MG 24 hr tablet, Take 1 tablet (10 mg total) by mouth daily., Disp: 30 tablet, Rfl: 11   polyethylene glycol powder (GLYCOLAX/MIRALAX) 17 GM/SCOOP powder, Take 17 g by mouth daily as needed for mild constipation., Disp: 500 g, Rfl: 0   Medications ordered in this encounter:  Meds ordered this encounter  Medications   brompheniramine-pseudoephedrine-DM 30-2-10 MG/5ML syrup    Sig: Take 5 mLs by mouth 4 (four) times daily as needed.    Dispense:  120 mL    Refill:  0    Order Specific Question:   Supervising Provider    Answer:   Chase Picket [[id number]]   predniSONE (DELTASONE) 20 MG tablet    Sig: Take 2 tablets (40 mg total) by mouth daily with breakfast for 5 days.    Dispense:  10 tablet    Refill:  0    Order Specific Question:   Supervising Provider    Answer:   Chase Picket [[id number]]   fluticasone (FLONASE) 50 MCG/ACT  nasal spray    Sig: Place 2 sprays into both nostrils daily.    Dispense:  16 g    Refill:  0    Order Specific Question:   Supervising Provider    Answer:   Chase Picket [id number]     *If you need refills on other medications prior to your next appointment, please contact your pharmacy*  Follow-Up: Call back or seek an in-person evaluation if the symptoms worsen or if the condition fails to improve as anticipated.  [facility] [phone]  Other Instructions  - Take meds as prescribed - Rest voice - Use a cool mist humidifier especially during the winter months when heat dries out the air. -  Use saline nose sprays frequently to help soothe nasal passages if they are drying out. - Stay hydrated by drinking plenty of fluids - Keep thermostat turn down low to prevent drying out which can cause a dry cough. - For any cough or congestion- robitussin DM or Delsym as needed - For fever or aches or pains- take tylenol or ibuprofen as directed on bottle             * for fevers greater than 101 orally you may alternate ibuprofen and tylenol every 3 hours.  If you do not improve you will need a follow up visit in person.                  If you have been instructed to have an in-person evaluation today at a local Urgent Care facility, please use the link below. It will take you to a list of all of our available [facility], including address, phone number and hours of operation. Please do not delay care.  [facility]  If you or a family member do not have a primary care provider, use the link below to schedule a visit and establish care. When you choose a [facility] primary care physician or advanced practice provider, you gain a long-term partner in health. Find a Primary Care Provider  Learn more about [facility]'s in-office and virtual care options: [facility]

## 2021-12-29 NOTE — Progress Notes (Signed)
Virtual Visit Consent   [name], you are scheduled for a virtual visit with a [facility] provider today. Just as with appointments in the office, your consent must be obtained to participate. Your consent will be active for this visit and any virtual visit you may have with one of our providers in the next 365 days. If you have a MyChart account, a copy of this consent can be sent to you electronically.  As this is a virtual visit, video technology does not allow for your provider to perform a traditional examination. This may limit your provider's ability to fully assess your condition. If your provider identifies any concerns that need to be evaluated in person or the need to arrange testing (such as labs, EKG, etc.), we will make arrangements to do so. Although advances in technology are sophisticated, we cannot ensure that it will always work on either your end or our end. If the connection with a video visit is poor, the visit may have to be switched to a telephone visit. With either a video or telephone visit, we are not always able to ensure that we have a secure connection.  By engaging in this virtual visit, you consent to the provision of healthcare and authorize for your insurance to be billed (if applicable) for the services provided during this visit. Depending on your insurance coverage, you may receive a charge related to this service.  I need to obtain your verbal consent now. Are you willing to proceed with your visit today? [name] has provided verbal consent on 12/29/2021 for a virtual visit (video or telephone). Perlie Mayo, NP  Date: 12/29/2021 12:15 PM  Virtual Visit via Video Note   I, Perlie Mayo, connected with  [name]  ([id number], 14-Jun-1975) on 12/29/21 at 12:15 PM EST by a video-enabled telemedicine application and verified that I am speaking with the correct person using two identifiers.  Location: Patient: Virtual Visit  Location Patient: Home Provider: Virtual Visit Location Provider: Home Office   I discussed the limitations of evaluation and management by telemedicine and the availability of in person appointments. The patient expressed understanding and agreed to proceed.    History of Present Illness: [name] is a 46 y.o. who identifies as a female who was assigned female at birth, and is being seen today for persistent cough. Has been sick since she received the flu and PNA vaccines at the PCP office 12/18/2021. Her father was recently diagnosed with Flu A. She has continue to feel poorly as the time continues. Notes that over the weekend on 12/26/2021 she developed increased congestion, fever of 99, loss of appetite, cough continued. Reports mild shortness of breath with coughing. Denies ear pain, sore throat, or chest pain.    Problems:  Patient Active Problem List   Diagnosis Date Noted   Constipation 12/18/2021   Cervical cancer screening 05/08/2019   Breast cancer screening by mammogram 05/08/2019   Tennis elbow 11/27/2018   Positive urine drug screen 07/15/2018   Closed left ankle fracture 06/14/2018   Attention deficit 05/08/2018   Acute cough 02/15/2018   Family history of colon cancer 02/15/2018   Vitamin D deficiency 09/17/2015   Muscular dystrophy ([place]) 03/26/2015   Neuropathic pain 03/26/2015   Irregular periods 02/26/2015   Allergic rhinitis 02/26/2015   Tooth pain 01/08/2015   Anxiety and depression 12/26/2014   Charcot-Marie-Tooth disease 12/26/2014   Stress incontinence 12/26/2014   Iron deficiency 12/26/2014  Current tobacco use 07/11/2013   Chronic pain 08/25/2012   Foot pain 08/25/2012   Other long term (current) drug therapy 09/30/2011   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   Arthritis, neuropathic 03/18/2011   Closed dislocation of tarsometatarsal joint 03/18/2011   Acquired deformities of toe 03/04/2011   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   Ulcer of right foot ([place]) 12/21/2010    Allergies:  Allergies  Allergen Reactions   Bee Venom Swelling   Vancomycin Shortness Of Breath   Penicillins     Other reaction(s): RASH   Sulfamethoxazole-Trimethoprim Itching   Tramadol Nausea Only    Other reaction(s): NAUSEA   Clindamycin Nausea Only    And heartburn   Medications:  Current Outpatient Medications:    albuterol (VENTOLIN HFA) 108 (90 Base) MCG/ACT inhaler, INHALE 2 PUFFS EVERY 6 HOURS AS NEEDED FOR WHEEZE OR SHORTNESS OF BREATH, Disp: 18 each, Rfl: 1   benzonatate (TESSALON) 100 MG capsule, Take 1 capsule (100 mg total) by mouth 2 (two) times daily as needed for cough. (Patient not taking: Reported on 12/18/2021), Disp: 20 capsule, Rfl: 0   busPIRone (BUSPAR) 7.5 MG tablet, Take 1 tablet (7.5 mg total) by mouth 3 (three) times daily., Disp: 270 tablet, Rfl: 0   diclofenac Sodium (VOLTAREN) 1 % GEL, Apply 2 g topically 4 (four) times daily. Prn, Disp: 150 g, Rfl: 2   doxycycline (VIBRA-TABS) 100 MG tablet, Take 1 tablet (100 mg total) by mouth 2 (two) times daily., Disp: 20 tablet, Rfl: 0   escitalopram (LEXAPRO) 20 MG tablet, Take 1 tablet (20 mg total) by mouth daily., Disp: 90 tablet, Rfl: 0   fluticasone (FLONASE) 50 MCG/ACT nasal spray, Place 2 sprays into both nostrils daily., Disp: 16 g, Rfl: 6   gentamicin cream (GARAMYCIN) 0.1 %, Apply 1 Application topically 2 (two) times daily., Disp: 30 g, Rfl: 1   guaiFENesin (ROBITUSSIN) 100 MG/5ML liquid, Take 5 mLs by mouth every 6 (six) hours as needed for cough or to loosen phlegm. (Patient not taking: Reported on 12/18/2021), Disp: 120 mL, Rfl: 0   lidocaine-prilocaine (EMLA) cream, Apply 1 Application topically as needed., Disp: 30 g, Rfl: 1   meloxicam (MOBIC) 7.5 MG tablet, , Disp: , Rfl:    oxybutynin (DITROPAN-XL) 10 MG 24 hr tablet, Take 1 tablet (10 mg total) by mouth daily., Disp: 30 tablet, Rfl: 11   polyethylene glycol powder (GLYCOLAX/MIRALAX) 17  GM/SCOOP powder, Take 17 g by mouth daily as needed for mild constipation., Disp: 500 g, Rfl: 0  Observations/Objective: Patient is well-developed, well-nourished in no acute distress.  Resting comfortably  at home.  Head is normocephalic, atraumatic.  No labored breathing.  Speech is clear and coherent with logical content.  Patient is alert and oriented at baseline.    Assessment and Plan:  1. Viral bronchitis  - brompheniramine-pseudoephedrine-DM 30-2-10 MG/5ML syrup; Take 5 mLs by mouth 4 (four) times daily as needed.  Dispense: 120 mL; Refill: 0 - predniSONE (DELTASONE) 20 MG tablet; Take 2 tablets (40 mg total) by mouth daily with breakfast for 5 days.  Dispense: 10 tablet; Refill: 0 - fluticasone (FLONASE) 50 MCG/ACT nasal spray; Place 2 sprays into both nostrils daily.  Dispense: 16 g; Refill: 0  - Take meds as prescribed - Rest voice - Use a cool mist humidifier especially during the winter months when heat dries out the air. - Use saline nose sprays frequently to help soothe nasal passages if they are drying out. - Stay  hydrated by drinking plenty of fluids - Keep thermostat turn down low to prevent drying out which can cause a dry cough. - For any cough or congestion- robitussin DM or Delsym as needed - For fever or aches or pains- take tylenol or ibuprofen as directed on bottle             * for fevers greater than 101 orally you may alternate ibuprofen and tylenol every 3 hours.  If you do not improve you will need a follow up visit in person.               -covid test encouraged -follow up if + -OTC reviewed and on AVS -take meds as ordered -in person precautions reviewed   Reviewed side effects, risks and benefits of medication.    Patient acknowledged agreement and understanding of the plan.   Past Medical, Surgical, Social History, Allergies, and Medications have been Reviewed.      Follow Up Instructions: I discussed the assessment and treatment plan  with the patient. The patient was provided an opportunity to ask questions and all were answered. The patient agreed with the plan and demonstrated an understanding of the instructions.  A copy of instructions were sent to the patient via MyChart unless otherwise noted below.     The patient was advised to call back or seek an in-person evaluation if the symptoms worsen or if the condition fails to improve as anticipated.  Time:  I spent 10 minutes with the patient via telehealth technology discussing the above problems/concerns.    Perlie Mayo, NP

## 2022-01-04 ENCOUNTER — Encounter: Payer: Medicare PPO | Attending: Physician Assistant | Admitting: Physician Assistant

## 2022-01-04 ENCOUNTER — Ambulatory Visit: Payer: Medicare PPO | Admitting: Psychology

## 2022-01-04 DIAGNOSIS — G7100 Muscular dystrophy, unspecified: Secondary | ICD-10-CM | POA: Insufficient documentation

## 2022-01-04 DIAGNOSIS — F17210 Nicotine dependence, cigarettes, uncomplicated: Secondary | ICD-10-CM | POA: Diagnosis not present

## 2022-01-04 DIAGNOSIS — M86671 Other chronic osteomyelitis, right ankle and foot: Secondary | ICD-10-CM | POA: Diagnosis not present

## 2022-01-04 DIAGNOSIS — L84 Corns and callosities: Secondary | ICD-10-CM | POA: Insufficient documentation

## 2022-01-04 DIAGNOSIS — G8929 Other chronic pain: Secondary | ICD-10-CM | POA: Diagnosis not present

## 2022-01-04 DIAGNOSIS — G600 Hereditary motor and sensory neuropathy: Secondary | ICD-10-CM | POA: Diagnosis not present

## 2022-01-04 DIAGNOSIS — L97512 Non-pressure chronic ulcer of other part of right foot with fat layer exposed: Secondary | ICD-10-CM | POA: Diagnosis not present

## 2022-01-04 DIAGNOSIS — L02611 Cutaneous abscess of right foot: Secondary | ICD-10-CM | POA: Diagnosis not present

## 2022-01-04 NOTE — Progress Notes (Signed)
Kelly, [name] ([id number]) [id number] Page 1 of 9 Visit Report for 01/04/2022 Chief Complaint Document Details Patient Name: Date of Service: [name], [name] 01/04/2022 9:00 A M Medical Record Number: [id number] Patient Account Number: [id number] Date of Birth/Sex: Treating RN: 10/16/1975 (46 y.o. [name] Primary Care Provider: [name] Other Clinician: [name] Referring Provider: Treating Provider/Extender: [name] in Treatment: 19 Information Obtained from: Patient Chief Complaint Right foot ulcer Electronic Signature(s) Signed: 01/04/2022 9:25:50 AM By: [name] Entered By: Worthy Keeler on 01/04/2022 09:25:50 -------------------------------------------------------------------------------- Debridement Details Patient Name: Date of Service: [name], [name] 01/04/2022 9:00 A M Medical Record Number: [id number] Patient Account Number: [id number] Date of Birth/Sex: Treating RN: 08/22/1975 (46 y.o. [name] Primary Care Provider: [name] Other Clinician: [name] Referring Provider: Treating Provider/Extender: [name] in Treatment: 19 Debridement Performed for Assessment: Wound #2 Plantar Foot Performed By: Physician [name], [name] Debridement Type: Debridement Level of Consciousness (Pre-procedure): Awake and Alert Pre-procedure Verification/Time Out Yes - 09:46 Taken: Start Time: 09:46 T Area Debrided (L x W): otal 1.5 (cm) x 1.5 (cm) = 2.25 (cm) Tissue and other material debrided: Viable, Non-Viable, Callus, Slough, Subcutaneous, Slough Level: Skin/Subcutaneous Tissue Debridement Description: Excisional Instrument: Curette Bleeding: Minimum Hemostasis Achieved: Pressure Response to Treatment: Procedure was tolerated well Level of Consciousness (Post- Awake and Alert procedure): Post Debridement Measurements of  Total Wound Spillane, Carine M ([id number]) [id number] Page 2 of 9 Length: (cm) 0.3 Width: (cm) 0.5 Depth: (cm) 0.3 Volume: (cm) 0.035 Character of Wound/Ulcer Post Debridement: Stable Post Procedure Diagnosis Same as Pre-procedure Electronic Signature(s) Signed: 01/04/2022 4:45:38 PM By: [name] Signed: 01/04/2022 5:48:39 PM By: Gretta Cool BSN, RN, CWS, Kim RN, BSN Signed: 01/05/2022 4:44:06 PM By: [name] Entered By: [name] on 01/04/2022 09:50:55 -------------------------------------------------------------------------------- HPI Details Patient Name: Date of Service: Kelly Clock, MA TTIE M. 01/04/2022 9:00 A M Medical Record Number: [id number] Patient Account Number: [id number] Date of Birth/Sex: Treating RN: June 21, 1975 (46 y.o. [name] Primary Care Provider: [name] Other Clinician: [name] Referring Provider: Treating Provider/Extender: [name] in Treatment: 19 History of Present Illness Location: right first metatarsal head plantar aspect Quality: Patient reports experiencing a dull pain to affected area(s). Severity: Patient states wound are getting worse. Duration: Patient has had the wound for > 2 months prior to seeking treatment at the wound center Timing: Pain in wound is Intermittent (comes and goes Context: The wound appeared gradually over time Modifying Factors: Other treatment(s) tried include:pain clinic for her narcotics ssociated Signs and Symptoms: Patient reports having difficulty standing for long periods. A HPI Description: 46 year old patient sent to [name] by her PCP 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 Her notes were reviewed and there was extensive history of opioid treatment and  management by the [facility] pain clinic from where she has been terminated. X-ray of the right foot foot was done on 03/13/2015 and was suggestive of active infection and possible osteomyelitis. her past surgical history includes amputation of the left big toe in 2011 and amputation of the right big toe in 2015. She was sitting the wound clinic at [facility] and we will try and obtain these notes. She is a smoker and smokes about 33  cigarettes a day. 06/12/2015 - x-ray of the right foot -- IMPRESSION:1. Soft tissue swelling of the plantar surface of the foot. No underlying acute abnormality. 2. Amputation deformity again of the right great toe. Old healed bony deformity noted the right second metatarsal. No acute bony abnormality identified. We have not received any reports from the [facility] hospital yet. She continues to smoke at least 10 cigarettes a day 06/19/2015 -- she has increasing pain in the right foot and also has had some low-grade fever. Her vascular test is scheduled for tomorrow and her MRIs not till next week. Readmission: 08-20-2021 upon evaluation today patient presents for initial inspection here in our clinic although she has been seen that was back in 2017. At that time she was having a issue with her right foot. With that being said upon inspection today she is actually having issues with the right foot as well on the medial aspect where she has some breakdown due to what I am assuming is pressure to the foot location. 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 I did review her culture report which showed evidence of bacteria including hemolytic Streptococcus group A as well as Staphylococcus aureus. The patient is currently on doxycycline and tells me that this is getting much better. T opical gentamicin is also  being utilized. Also did review her arterial studies which show that she has on the right and ABI of 1.15 which is triphasic and appears to be excellent there is no significant peripheral artery disease noted the left is also. With ABI of 1.11 and a good report in that regard as well. Lastly I did also review her MRI of the right forefoot which was performed on 07-06-2021. This shows that she does have mild subcortical marrow edema in the stump of the first metatarsal concerning for osteomyelitis. There is also a malunited fracture of the second metatarsal. The patient has bone marrow edema in the base of the second third fourth and fifth metatarsals concerning for stress reaction obviously this infection is going to be an issue for [name] here as well she needs to be treated appropriately and I do believe that the doxycycline is a good option right now although I may need to #1 extend this and #2 plan for even altering or adding to the treatment regimen depending on how things progress. Patient voiced understanding.Marland Kitchen 09-01-2021 upon evaluation today patient appears to be doing excellent in regard to her wound. She is actually showing signs of excellent improvement which is LATAJA, NEWLAND ([id number]) [id number] Page 3 of 9 great news. Fortunately I do not see any signs of active infection locally or systemically at this point which is great news and overall I am extremely pleased with where things stand today. I do not see any evidence of active infection locally or systemically. 09-07-2021 upon evaluation today patient appears to be doing well with regard to her foot ulcer. This is actually showing signs of improvement which is great news. Fortunately there does not appear to be any signs of active infection locally or systemically at this time which is as well. 09-22-2021 upon evaluation today patient appears to be doing well currently in regard to her wound. This is measuring  smaller and looking better were slowly making progress here. Fortunately there does not appear to be any  evidence of active infection locally or systemically at this time. Upon inspection patient's wound bed showed evidence of good granulation and epithelization at this point. 10-05-2021 upon evaluation today patient's wound shows a lot of callus buildup but the surface of the wound is actually doing quite well. She is going require some sharp debridement to clearway some of the necrotic debris. 10-19-2021 upon evaluation today patient appears to be doing better in regard to her wound which is actually measuring little bit smaller today. Fortunately there does not appear to be any signs of significant infection she does have quite a bit of callus we will get have to work on that today. Otherwise I think we are headed in the appropriate direction. 11-09-2021 upon evaluation today patient appears to be doing well in regard to her wound from the standpoint of infection and actually appears to be doing better she actually just completed the doxycycline. With that being said I am hopeful that this took care of the osteomyelitis although that something that we will definitely keep a close eye on. With that being said I do believe that she is still having tremendous amount of callus buildup which I believe in turn is a result of her having issues here with Friction and callus buildup. With that being said I do believe that the patient would benefit here from a total contact cast I discussed that with her today. I do want to get more aggressive to get this closed as I think that if we do not then she is more likely to have a long standing issue here going forward. 11-17-2021 upon evaluation today patient appears to be doing well although her wound is still open. I do think that she is ready to be placed in a total contact cast. She actually has come back on Thursday to have this changed out. Fortunately and  overall I do not see any signs of worsening and I think that we should be able to get this on without any complication or problem.. 11-19-2021 upon evaluation today patient appears to be doing well currently in regard to her wound. She has been tolerating the dressing changes without complication and overall I am extremely pleased with where we stand today. I do not see any evidence of active infection locally or systemically at this time which is great news. I think the total contact cast did an awesome job for her. 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 Only a very small area remains on the plantar foot. I had to use illumination to even see anything that looked open here. Much improved 12-03-2021 upon evaluation today patient appears to be doing well currently in regard to her wound. Of note she tells me that she did end up cutting off her cast with a pair of kitchen shears 2 nights ago. She states that she has been walking on it more than she should have after the cast was put on due to a friend who ended up in the hospital that it caught her right after she had a put on that same day. She feels like she probably deformed the cast that it was causing some pressure points that she was afraid to get a blister and did not want another wound. For that reason she did go ahead and cut the soft and does not appear to have caused any damage. She tells me that it only took 7 minutes. With that being said I  do think the wound actually looks really good she has a lot of callus buildup I am going to remove that just to make sure nothing side underneath but other than that overall she seems to be doing quite well. 12-07-2021 upon evaluation today patient's wound is actually showing signs of excellent improvement and very pleased with where we stand I think we are headed in the right direction. Fortunately there does not appear to be any  signs of active infection at this time which is great news. No fevers, chills, nausea, vomiting, or diarrhea. 01-14-2022 upon evaluation today patient appears to be doing well currently in regard to her wound she does have a bit of callus buildup unfortunately but otherwise does not seem to show any signs of active infection locally or systemically at this time. Fortunately I do not see any evidence of infection locally or systemically which is great news. She does have a lot of callus buildup she has been very sick so has not been in to be seen recently. With that being said I do not think her wound is a whole lot worse than what it was before but it is also a lot more callused over so it is difficult to tell initially. Electronic Signature(s) Signed: 01/04/2022 9:59:40 AM By: [name] Entered By: Worthy Keeler on 01/04/2022 09:59:39 -------------------------------------------------------------------------------- Physical Exam Details Patient Name: Date of Service: [name], [name] 01/04/2022 9:00 A M Medical Record Number: [id number] Patient Account Number: [id number] Date of Birth/Sex: Treating RN: [birth year] (46 y.o. [name] Primary Care Provider: [name] Other Clinician: [name] Referring Provider: Treating Provider/Extender: [name] in Treatment: 58 Constitutional Well-nourished and well-hydrated in no acute distress. Respiratory normal breathing without difficulty. HYDEE, FLEECE ([id number]) [id number] Page 4 of 9 Psychiatric this patient is able to make decisions and demonstrates good insight into disease process. Alert and Oriented x 3. pleasant and cooperative. Notes Upon inspection patient's wound bed actually showed signs of good granulation epithelization at this point. Fortunately I do not see any evidence of infection locally or systemically which is great news and overall I  am extremely pleased with where we stand today. I do believe that we are headed in the right direction but again we have been somewhat slowed by the fact that the patient obviously was unable to make it to appointment due to being so sick. I am going to have to perform some debridement to clearway some of the callus and she tolerated that today without complication postdebridement this appears to be doing much better. Electronic Signature(s) Signed: 01/04/2022 10:38:46 AM By: [name] Entered By: Worthy Keeler on 01/04/2022 10:38:46 -------------------------------------------------------------------------------- Physician Orders Details Patient Name: Date of Service: [name], [name] 01/04/2022 9:00 A M Medical Record Number: [id number] Patient Account Number: [id number] Date of Birth/Sex: Treating RN: [birth year] (46 y.o. 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 Nurse Visit as needed Bathing/ Shower/ Hygiene May shower with wound dressing protected with water repellent cover or cast protector. No tub bath.  Edema Control - Lymphedema / Segmental Compressive Device / Other Elevate, Exercise Daily and A void Standing for Long Periods of Time. Elevate legs to the level of the heart and pump ankles as often as possible Elevate leg(s) parallel to the floor when sitting. Medications-Please add to medication list. Wound #2 Plantar Foot Take one 514m  Tylenol (A cetaminophen) and one 2020mMotrin (Ibuprofen) every 6 hours for pain. Do not take ibuprofen if you are on blood thinners or have stomach ulcers. - take every 4 hours while awake Wound Treatment Wound #2 - Foot Wound Laterality: Plantar Cleanser: Byram Ancillary Kit - 15 Day Supply (Generic) 3 x Per Day/30 Days Discharge Instructions: Use supplies as instructed; Kit contains: (15) Saline Bullets; (15) 3x3 Gauze; 15 pr Gloves Cleanser: Soap and Water 3 x Per Day/30 Days Discharge Instructions: Gently cleanse wound with antibacterial soap, rinse and pat dry prior to dressing wounds Prim Dressing: Prisma 4.34 (in) ary 3 x Per Day/30 Days NELEILANA, [id number]) [id number] Page 5 of 9 Discharge Instructions: Moisten w/normal saline or sterile water; Cover wound as directed. Do not remove from wound bed. Secondary Dressing: Zetuvit Plus 4x4 (in/in) (DME) (Dispense As Written) 3 x Per Day/30 Days Electronic Signature(s) Signed: 01/04/2022 4:45:38 PM By: StWorthy KeelerA-C Signed: 01/05/2022 4:44:06 PM By: VeMassie Kluverntered By: VeMassie Kluvern 01/04/2022 09:54:26 -------------------------------------------------------------------------------- Problem List Details Patient Name: Date of Service: NETheodoro ClockMABrenton Grills. 01/04/2022 9:00 A M Medical Record Number: [id number] Account Number: [id number] of Birth/Sex: Treating RN: [id number].o. 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 F)Kelly Shoresrimary Care Provider: SoTommi Rumpsther Clinician: VeMassie Kluvereferring Provider: Treating Provider/Extender: StCecelia Byarsn Treatment: 19 Subjective Chief Complaint Information obtained from Patient Right foot ulcer History of Present Illness (HPI) The following HPI elements were documented for the patient's wound: Location: right first metatarsal head plantar aspect Quality: Patient reports experiencing a dull pain to affected area(s). Severity: Patient states wound are getting worse. Duration: Patient has had the wound for > 2 months prior to seeking treatment at the wound center Timing: Pain in wound is Intermittent (comes and goes Context: The wound appeared gradually over time Modifying Factors: Other treatment(s) tried include:pain clinic for her narcotics Associated Signs and Symptoms: Patient reports having difficulty standing for long periods. 2257ear old [place]. DaHolland Fallingho saw her recently on 05/15/2015 for chronic pain related to Charcot MaLelan Ponsooth disease and subsequent problems with her feet with a history of bilateral great toe amputations for osteomyelitis. Her notes were reviewed and there was extensive  history of opioid treatment and management  by the [facility] pain clinic from where she has been terminated. X-ray of the right foot foot was done on 03/13/2015 and was suggestive of active infection and possible osteomyelitis. her past surgical history includes amputation of the left big toe in 2011 and amputation of the right big toe in 2015. She was sitting the wound clinic at [facility] and we will try and obtain these notes. She is a smoker and smokes about 15 cigarettes a day. 06/12/2015 - x-ray of the right foot -- IMPRESSION:1. Soft tissue swelling of the plantar surface of the foot. No underlying acute abnormality. 2. Amputation deformity again of the right great toe. Old healed bony deformity noted the right second metatarsal. No acute bony abnormality identified. We have not received any reports from the [facility] hospital yet. She continues to smoke at least 10 cigarettes a day 06/19/2015 -- she has increasing pain in the right foot and also has had some low-grade fever. Her vascular test is scheduled for tomorrow and her MRIs not till next week. Readmission: 08-20-2021 upon evaluation today patient presents for initial inspection here in our clinic although she has been seen that was back in 2017. At that time she was having a issue with her right foot. With that being said upon inspection today she is actually having issues with the right foot as well on the medial aspect where she has some breakdown due to what I am assuming is pressure to the foot location. 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 I did review her culture report which showed evidence of bacteria including hemolytic Streptococcus group A as well as Staphylococcus aureus. The patient is currently on doxycycline and tells me that this is getting much better. T opical gentamicin is also being  utilized. Also did review her arterial studies which show that she has on the right and ABI of 1.15 which is triphasic and appears to be excellent there is no significant peripheral artery disease noted the left is also. With ABI of 1.11 and a good report in that regard as well. Lastly I did also review her MRI of the right forefoot which was performed on 07-06-2021. This shows that she does have mild subcortical marrow edema in the stump of the first metatarsal concerning for osteomyelitis. There is also a malunited fracture of the second metatarsal. The patient has bone marrow edema in the base of the second third fourth and fifth metatarsals concerning for stress reaction obviously this infection is going to be an issue for [name] here as well she needs to be treated appropriately and I do believe that the doxycycline is a good option right now although I may need to #1 extend this and #2 plan for even altering or adding to the treatment regimen depending on how things progress. Patient voiced understanding.Marland Kitchen 09-01-2021 upon evaluation today patient appears to be doing excellent in regard to her wound. She is actually showing signs of excellent improvement which is great news. Fortunately I do not see any signs of active infection locally or systemically at this point which is great news and overall I am extremely pleased with where things stand today. I do not see any evidence of active infection locally or systemically. 09-07-2021 upon evaluation today patient appears to be doing well with  regard to her foot ulcer. This is actually showing signs of improvement which is great news. Fortunately there does not appear to be any signs of active infection locally or systemically at this time which is as well. 09-22-2021 upon evaluation today patient appears to be doing well currently in regard to her wound. This is measuring smaller and looking better were slowly making progress here. Fortunately there does not  appear to be any evidence of active infection locally or systemically at this time. Upon inspection patient's wound bed showed evidence of good granulation and epithelization at this point. 10-05-2021 upon evaluation today patient's wound shows a lot of callus buildup but the surface of the wound is actually doing quite well. She is going require some sharp debridement to clearway some of the necrotic debris. 10-19-2021 upon evaluation today patient appears to be doing better in regard to her wound which is actually measuring little bit smaller today. Fortunately there does not appear to be any signs of significant infection she does have quite a bit of callus we will get have to work on that today. Otherwise I think we are FABIANA, DROMGOOLE ([id number]) [id number] Page 7 of 9 headed in the appropriate direction. 11-09-2021 upon evaluation today patient appears to be doing well in regard to her wound from the standpoint of infection and actually appears to be doing better she actually just completed the doxycycline. With that being said I am hopeful that this took care of the osteomyelitis although that something that we will definitely keep a close eye on. With that being said I do believe that she is still having tremendous amount of callus buildup which I believe in turn is a result of her having issues here with Friction and callus buildup. With that being said I do believe that the patient would benefit here from a total contact cast I discussed that with her today. I do want to get more aggressive to get this closed as I think that if we do not then she is more likely to have a long standing issue here going forward. 11-17-2021 upon evaluation today patient appears to be doing well although her wound is still open. I do think that she is ready to be placed in a total contact cast. She actually has come back on Thursday to have this changed out. Fortunately and overall I  do not see any signs of worsening and I think that we should be able to get this on without any complication or problem.. 11-19-2021 upon evaluation today patient appears to be doing well currently in regard to her wound. She has been tolerating the dressing changes without complication and overall I am extremely pleased with where we stand today. I do not see any evidence of active infection locally or systemically at this time which is great news. I think the total contact cast did an awesome job for her. 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 Only a very small area remains on the plantar foot. I had to use illumination to even see anything that looked open here. Much improved 12-03-2021 upon evaluation today patient appears to be doing well currently in regard to her wound. Of note she tells me that she did end up cutting off her cast with a pair of kitchen shears 2 nights ago. She states that she has been walking on it more than she should have after the cast was put on due to a  friend who ended up in the hospital that it caught her right after she had a put on that same day. She feels like she probably deformed the cast that it was causing some pressure points that she was afraid to get a blister and did not want another wound. For that reason she did go ahead and cut the soft and does not appear to have caused any damage. She tells me that it only took 7 minutes. With that being said I do think the wound actually looks really good she has a lot of callus buildup I am going to remove that just to make sure nothing side underneath but other than that overall she seems to be doing quite well. 12-07-2021 upon evaluation today patient's wound is actually showing signs of excellent improvement and very pleased with where we stand I think we are headed in the right direction. Fortunately there does not appear to be any signs of  active infection at this time which is great news. No fevers, chills, nausea, vomiting, or diarrhea. 01-14-2022 upon evaluation today patient appears to be doing well currently in regard to her wound she does have a bit of callus buildup unfortunately but otherwise does not seem to show any signs of active infection locally or systemically at this time. Fortunately I do not see any evidence of infection locally or systemically which is great news. She does have a lot of callus buildup she has been very sick so has not been in to be seen recently. With that being said I do not think her wound is a whole lot worse than what it was before but it is also a lot more callused over so it is difficult to tell initially. Objective Constitutional Well-nourished and well-hydrated in no acute distress. Vitals Time Taken: 9:12 AM, Height: 67 in, Weight: 153 lbs, BMI: 24, Temperature: 98.8 F, Pulse: 92 bpm, Respiratory Rate: 18 breaths/min, Blood Pressure: 126/81 mmHg. Respiratory normal breathing without difficulty. Psychiatric this patient is able to make decisions and demonstrates good insight into disease process. Alert and Oriented x 3. pleasant and cooperative. General Notes: Upon inspection patient's wound bed actually showed signs of good granulation epithelization at this point. Fortunately I do not see any evidence of infection locally or systemically which is great news and overall I am extremely pleased with where we stand today. I do believe that we are headed in the right direction but again we have been somewhat slowed by the fact that the patient obviously was unable to make it to appointment due to being so sick. I am going to have to perform some debridement to clearway some of the callus and she tolerated that today without complication postdebridement this appears to be doing much better. Integumentary (Hair, Skin) Wound #2 status is Open. Original cause of wound was Trauma. The date  acquired was: 06/18/2021. The wound has been in treatment 19 weeks. The wound is located on the Plantar Foot. The wound measures 0.1cm length x 0.5cm width x 0.3cm depth; 0.039cm^2 area and 0.012cm^3 volume. There is Fat Layer (Subcutaneous Tissue) exposed. Tunneling has been noted at 2:00 with a maximum distance of 1.4cm. Undermining begins at 12:00 and ends at 4:00 with a maximum distance of 0.5cm. There is a medium amount of serosanguineous drainage noted. There is large (67-100%) pink granulation within the wound bed. There is no necrotic tissue within the wound bed. 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 There was a Excisional Skin/Subcutaneous Tissue Debridement with a total area of 2.25 sq cm performed by [name], [name]. With the following instrument(s): Curette to remove Viable and Non-Viable tissue/material. Material removed includes Callus, Subcutaneous Tissue, and Slough. A time out was conducted at 09:46, prior to the start of the procedure. A Minimum amount of bleeding was controlled with Pressure. The procedure was tolerated well. Post Debridement Measurements: 0.3cm length x 0.5cm width x 0.3cm depth; 0.035cm^3 volume. Character of Wound/Ulcer Post Debridement is stable. Post procedure Diagnosis Wound #2: Same as Pre-Procedure Plan Follow-up Appointments: Return Appointment in 1 week. Nurse Visit as needed Bathing/ Shower/ Hygiene: May shower with wound dressing protected with water repellent cover or cast protector. No tub bath. Edema Control - Lymphedema / Segmental  Compressive Device / Other: Elevate, Exercise Daily and Avoid Standing for Long Periods of Time. Elevate legs to the level of the heart and pump ankles as often as possible Elevate leg(s) parallel to the floor when sitting. Medications-Please add to medication list.: Wound #2 Plantar Foot: T one 522m Tylenol (Acetaminophen) and one 203mMotrin (Ibuprofen) every 6 hours for pain. Do not take ibuprofen if you are on blood thinners or ake have stomach ulcers. - take every 4 hours while awake WOUND #2: - Foot Wound Laterality: Plantar Cleanser: Byram Ancillary Kit - 15 Day Supply (Generic) 3 x Per Day/30 Days Discharge Instructions: Use supplies as instructed; Kit contains: (15) Saline Bullets; (15) 3x3 Gauze; 15 pr Gloves Cleanser: Soap and Water 3 x Per Day/30 Days Discharge Instructions: Gently cleanse wound with antibacterial soap, rinse and pat dry prior to dressing wounds Prim Dressing: Prisma 4.34 (in) 3 x Per Day/30 Days ary Discharge Instructions: Moisten w/normal saline or sterile water; Cover wound as directed. Do not remove from wound bed. Secondary Dressing: Zetuvit Plus 4x4 (in/in) (DME) (Dispense As Written) 3 x Per Day/30 Days 1. Would recommend currently that we continue with silver collagen dressing which I think is still probably can to be the best way to go. 2. I am also going to recommend that the patient continue to try to keep pressure off is much as possible. Obviously this has been somewhat hard for her due to the previous surgery and shape of her foot she does get a lot of pressure to this certain point. Coupled with being pretty sick as well as her family being sick this has been a very difficult month for her over the past month. We will see patient back for reevaluation in 1 week here in the clinic. If anything worsens or changes patient will contact our office for additional recommendations. Electronic Signature(s) Signed: 01/04/2022 10:40:20 AM By: StWorthy KeelerA-C Entered By: StWorthy Keelern 01/04/2022 10:40:19 -------------------------------------------------------------------------------- SuperBill Details Patient Name: Date of Service: NETheodoro ClockMAHarrison Mons12/18/2023 Medical Record Number: [id number] Account Number: [id number] [id number] Birth/Sex: Treating RN: [id number].o. F)Kelly Shoresrimary Care Provider: SoTommi Rumpsther Clinician: VeMassie Kluvereferring Provider: Treating Provider/Extender: StCecelia Byarsn Treatment: [address]MATTIE M Jerilynn [id number] Page 9 of 9 Diagnosis Coding ICD-10 Codes Code Description [phone] chronic osteomyelitis, right ankle and foot L97.512 Non-pressure chronic ulcer of  other part of right foot with fat layer exposed G71.00 Muscular dystrophy, unspecified G60.0 Hereditary motor and sensory neuropathy L84 Corns and callosities Facility Procedures : CPT4 Code: [id number] Description: 72902 - DEB SUBQ TISSUE 20 SQ CM/< ICD-10 Diagnosis Description L97.512 Non-pressure chronic ulcer of other part of right foot with fat layer exposed Modifier: Quantity: 1 Physician Procedures : CPT4 Code Description Modifier [id number] 11042 - WC PHYS SUBQ TISS 20 SQ CM ICD-10 Diagnosis Description L97.512 Non-pressure chronic ulcer of other part of right foot with fat layer exposed Quantity: 1 Electronic Signature(s) Signed: 01/04/2022 10:40:42 AM By: [name] Entered By: Worthy Keeler on 01/04/2022 10:40:41

## 2022-01-05 ENCOUNTER — Telehealth: Payer: Medicare PPO | Admitting: Family Medicine

## 2022-01-05 DIAGNOSIS — B9689 Other specified bacterial agents as the cause of diseases classified elsewhere: Secondary | ICD-10-CM

## 2022-01-05 DIAGNOSIS — J329 Chronic sinusitis, unspecified: Secondary | ICD-10-CM | POA: Diagnosis not present

## 2022-01-05 MED ORDER — PSEUDOEPH-BROMPHEN-DM 30-2-10 MG/5ML PO SYRP: 5.0000 mL | ORAL_SOLUTION | Freq: Four times a day (QID) | ORAL | 0 refills | Status: DC | PRN

## 2022-01-05 MED ORDER — DOXYCYCLINE HYCLATE 100 MG PO TABS: 100.0000 mg | ORAL_TABLET | Freq: Two times a day (BID) | ORAL | 0 refills | Status: AC

## 2022-01-05 NOTE — Patient Instructions (Signed)
[name], thank you for joining Perlie Mayo, NP for today's virtual visit.  While this provider is not your primary care provider (PCP), if your PCP is located in our provider database this encounter information will be shared with them immediately following your visit.   [facility] account gives you access to today's visit and all your visits, tests, and labs performed at [facility] " click here if you don't have a [place] account or go to [email or web address]  Consent: (Patient) [name] provided verbal consent for this virtual visit at the beginning of the encounter.  Current Medications:  Current Outpatient Medications:    brompheniramine-pseudoephedrine-DM 30-2-10 MG/5ML syrup, Take 5 mLs by mouth 4 (four) times daily as needed., Disp: 120 mL, Rfl: 0   doxycycline (VIBRA-TABS) 100 MG tablet, Take 1 tablet (100 mg total) by mouth 2 (two) times daily for 10 days., Disp: 20 tablet, Rfl: 0   albuterol (VENTOLIN HFA) 108 (90 Base) MCG/ACT inhaler, INHALE 2 PUFFS EVERY 6 HOURS AS NEEDED FOR WHEEZE OR SHORTNESS OF BREATH, Disp: 18 each, Rfl: 1   benzonatate (TESSALON) 100 MG capsule, Take 1 capsule (100 mg total) by mouth 2 (two) times daily as needed for cough. (Patient not taking: Reported on 12/18/2021), Disp: 20 capsule, Rfl: 0   busPIRone (BUSPAR) 7.5 MG tablet, Take 1 tablet (7.5 mg total) by mouth 3 (three) times daily., Disp: 270 tablet, Rfl: 0   diclofenac Sodium (VOLTAREN) 1 % GEL, Apply 2 g topically 4 (four) times daily. Prn, Disp: 150 g, Rfl: 2   escitalopram (LEXAPRO) 20 MG tablet, Take 1 tablet (20 mg total) by mouth daily., Disp: 90 tablet, Rfl: 0   fluticasone (FLONASE) 50 MCG/ACT nasal spray, Place 2 sprays into both nostrils daily., Disp: 16 g, Rfl: 0   gentamicin cream (GARAMYCIN) 0.1 %, Apply 1 Application topically 2 (two) times daily., Disp: 30 g, Rfl: 1   guaiFENesin (ROBITUSSIN) 100 MG/5ML liquid, Take 5  mLs by mouth every 6 (six) hours as needed for cough or to loosen phlegm. (Patient not taking: Reported on 12/18/2021), Disp: 120 mL, Rfl: 0   lidocaine-prilocaine (EMLA) cream, Apply 1 Application topically as needed., Disp: 30 g, Rfl: 1   meloxicam (MOBIC) 7.5 MG tablet, , Disp: , Rfl:    oxybutynin (DITROPAN-XL) 10 MG 24 hr tablet, Take 1 tablet (10 mg total) by mouth daily., Disp: 30 tablet, Rfl: 11   polyethylene glycol powder (GLYCOLAX/MIRALAX) 17 GM/SCOOP powder, Take 17 g by mouth daily as needed for mild constipation., Disp: 500 g, Rfl: 0   Medications ordered in this encounter:  Meds ordered this encounter  Medications   doxycycline (VIBRA-TABS) 100 MG tablet    Sig: Take 1 tablet (100 mg total) by mouth 2 (two) times daily for 10 days.    Dispense:  20 tablet    Refill:  0    Order Specific Question:   Supervising Provider    Answer:   Chase Picket [id number]   brompheniramine-pseudoephedrine-DM 30-2-10 MG/5ML syrup    Sig: Take 5 mLs by mouth 4 (four) times daily as needed.    Dispense:  120 mL    Refill:  0    Order Specific Question:   Supervising Provider    Answer:   Chase Picket [[id number]]     *If you need refills on other medications prior to your next appointment, please contact your pharmacy*  Follow-Up: Call back or seek an in-person  evaluation if the symptoms worsen or if the condition fails to improve as anticipated.  [place] [phone]  Other Instructions  -Take meds as prescribed -Rest -Use a cool mist humidifier especially during the winter months when heat dries out the air. - Use saline nose sprays frequently to help soothe nasal passages and promote drainage. -Saline irrigations of the nose can be very helpful if done frequently.             * 4X daily for 1 week*             * Use of a nettie pot can be helpful with this.  *Follow directions with this* *Boiled or distilled water only -stay hydrated by drinking plenty  of fluids - Keep thermostat turn down low to prevent drying out sinuses - For any cough or congestion- robitussin DM or Delsym as needed - For fever or aches or pains- take tylenol or ibuprofen as directed on bottle             * for fevers greater than 101 orally you may alternate ibuprofen and tylenol every 3 hours.  If you do not improve you will need a follow up visit in person.                 If you have been instructed to have an in-person evaluation today at a local Urgent Care facility, please use the link below. It will take you to a list of all of our available [facility], including address, phone number and hours of operation. Please do not delay care.  [facility]  If you or a family member do not have a primary care provider, use the link below to schedule a visit and establish care. When you choose a [facility] primary care physician or advanced practice provider, you gain a long-term partner in health. Find a Primary Care Provider  Learn more about [facility]'s in-office and virtual care options: [facility]

## 2022-01-05 NOTE — Progress Notes (Signed)
Virtual Visit Consent   [name], you are scheduled for a virtual visit with a [facility] provider today. Just as with appointments in the office, your consent must be obtained to participate. Your consent will be active for this visit and any virtual visit you may have with one of our providers in the next 365 days. If you have a MyChart account, a copy of this consent can be sent to you electronically.  As this is a virtual visit, video technology does not allow for your provider to perform a traditional examination. This may limit your provider's ability to fully assess your condition. If your provider identifies any concerns that need to be evaluated in person or the need to arrange testing (such as labs, EKG, etc.), we will make arrangements to do so. Although advances in technology are sophisticated, we cannot ensure that it will always work on either your end or our end. If the connection with a video visit is poor, the visit may have to be switched to a telephone visit. With either a video or telephone visit, we are not always able to ensure that we have a secure connection.  By engaging in this virtual visit, you consent to the provision of healthcare and authorize for your insurance to be billed (if applicable) for the services provided during this visit. Depending on your insurance coverage, you may receive a charge related to this service.  I need to obtain your verbal consent now. Are you willing to proceed with your visit today? [name] has provided verbal consent on 01/05/2022 for a virtual visit (video or telephone). Perlie Mayo, NP  Date: 01/05/2022 12:46 PM  Virtual Visit via Video Note   I, Perlie Mayo, connected with  [name]  ([id number], 09-04-1975) on 01/05/22 at 12:45 PM EST by a video-enabled telemedicine application and verified that I am speaking with the correct person using two identifiers.  Location: Patient: Virtual Visit  Location Patient: Home Provider: Virtual Visit Location Provider: Home Office   I discussed the limitations of evaluation and management by telemedicine and the availability of in person appointments. The patient expressed understanding and agreed to proceed.    History of Present Illness: [name] is a 46 y.o. who identifies as a female who was assigned female at birth, and is being seen today for on going cough and congestion. Mild shortness of breath due to coughing and congestion. Denies fevers, chills, chest pain. Was seen by me on 12/29/21 for viral bronchitis- treated with prednisone and flonase (reports pharmacy was out of cough syrup)- mild improvement- but worsening congestion and mucus- known smoker.   Problems:  Patient Active Problem List   Diagnosis Date Noted   Constipation 12/18/2021   Cervical cancer screening 05/08/2019   Breast cancer screening by mammogram 05/08/2019   Tennis elbow 11/27/2018   Positive urine drug screen 07/15/2018   Closed left ankle fracture 06/14/2018   Attention deficit 05/08/2018   Acute cough 02/15/2018   Family history of colon cancer 02/15/2018   Vitamin D deficiency 09/17/2015   Muscular dystrophy ([place]) 03/26/2015   Neuropathic pain 03/26/2015   Irregular periods 02/26/2015   Allergic rhinitis 02/26/2015   Tooth pain 01/08/2015   Anxiety and depression 12/26/2014   Charcot-Marie-Tooth disease 12/26/2014   Stress incontinence 12/26/2014   Iron deficiency 12/26/2014   Current tobacco use 07/11/2013   Chronic pain 08/25/2012   Foot pain 08/25/2012   Other long term (current) drug  therapy 09/30/2011   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   Arthritis, neuropathic 03/18/2011   Closed dislocation of tarsometatarsal joint 03/18/2011   Acquired deformities of toe 03/04/2011   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   Ulcer of right foot ([place]) 12/21/2010    Allergies:  Allergies  Allergen Reactions   Bee  Venom Swelling   Vancomycin Shortness Of Breath   Penicillins     Other reaction(s): RASH   Sulfamethoxazole-Trimethoprim Itching   Tramadol Nausea Only    Other reaction(s): NAUSEA   Clindamycin Nausea Only    And heartburn   Medications:  Current Outpatient Medications:    albuterol (VENTOLIN HFA) 108 (90 Base) MCG/ACT inhaler, INHALE 2 PUFFS EVERY 6 HOURS AS NEEDED FOR WHEEZE OR SHORTNESS OF BREATH, Disp: 18 each, Rfl: 1   benzonatate (TESSALON) 100 MG capsule, Take 1 capsule (100 mg total) by mouth 2 (two) times daily as needed for cough. (Patient not taking: Reported on 12/18/2021), Disp: 20 capsule, Rfl: 0   brompheniramine-pseudoephedrine-DM 30-2-10 MG/5ML syrup, Take 5 mLs by mouth 4 (four) times daily as needed., Disp: 120 mL, Rfl: 0   busPIRone (BUSPAR) 7.5 MG tablet, Take 1 tablet (7.5 mg total) by mouth 3 (three) times daily., Disp: 270 tablet, Rfl: 0   diclofenac Sodium (VOLTAREN) 1 % GEL, Apply 2 g topically 4 (four) times daily. Prn, Disp: 150 g, Rfl: 2   doxycycline (VIBRA-TABS) 100 MG tablet, Take 1 tablet (100 mg total) by mouth 2 (two) times daily., Disp: 20 tablet, Rfl: 0   escitalopram (LEXAPRO) 20 MG tablet, Take 1 tablet (20 mg total) by mouth daily., Disp: 90 tablet, Rfl: 0   fluticasone (FLONASE) 50 MCG/ACT nasal spray, Place 2 sprays into both nostrils daily., Disp: 16 g, Rfl: 0   gentamicin cream (GARAMYCIN) 0.1 %, Apply 1 Application topically 2 (two) times daily., Disp: 30 g, Rfl: 1   guaiFENesin (ROBITUSSIN) 100 MG/5ML liquid, Take 5 mLs by mouth every 6 (six) hours as needed for cough or to loosen phlegm. (Patient not taking: Reported on 12/18/2021), Disp: 120 mL, Rfl: 0   lidocaine-prilocaine (EMLA) cream, Apply 1 Application topically as needed., Disp: 30 g, Rfl: 1   meloxicam (MOBIC) 7.5 MG tablet, , Disp: , Rfl:    oxybutynin (DITROPAN-XL) 10 MG 24 hr tablet, Take 1 tablet (10 mg total) by mouth daily., Disp: 30 tablet, Rfl: 11   polyethylene glycol powder  (GLYCOLAX/MIRALAX) 17 GM/SCOOP powder, Take 17 g by mouth daily as needed for mild constipation., Disp: 500 g, Rfl: 0  Observations/Objective: Patient is well-developed, well-nourished in no acute distress.  Resting comfortably  at home.  Head is normocephalic, atraumatic.  No labored breathing.  Speech is clear and coherent with logical content.  Patient is alert and oriented at baseline.    Assessment and Plan:  1. Bacterial sinusitis  - doxycycline (VIBRA-TABS) 100 MG tablet; Take 1 tablet (100 mg total) by mouth 2 (two) times daily for 10 days.  Dispense: 20 tablet; Refill: 0 - brompheniramine-pseudoephedrine-DM 30-2-10 MG/5ML syrup; Take 5 mLs by mouth 4 (four) times daily as needed.  Dispense: 120 mL; Refill: 0  -Take meds as prescribed -Rest -Use a cool mist humidifier especially during the winter months when heat dries out the air. - Use saline nose sprays frequently to help soothe nasal passages and promote drainage. -Saline irrigations of the nose can be very helpful if done frequently.             * 4X  daily for 1 week*             * Use of a nettie pot can be helpful with this.  *Follow directions with this* *Boiled or distilled water only -stay hydrated by drinking plenty of fluids - Keep thermostat turn down low to prevent drying out sinuses - For any cough or congestion- robitussin DM or Delsym as needed - For fever or aches or pains- take tylenol or ibuprofen as directed on bottle             * for fevers greater than 101 orally you may alternate ibuprofen and tylenol every 3 hours.  If you do not improve you will need a follow up visit in person.               Reviewed side effects, risks and benefits of medication.    Patient acknowledged agreement and understanding of the plan.   Past Medical, Surgical, Social History, Allergies, and Medications have been Reviewed.     Follow Up Instructions: I discussed the assessment and treatment plan with the  patient. The patient was provided an opportunity to ask questions and all were answered. The patient agreed with the plan and demonstrated an understanding of the instructions.  A copy of instructions were sent to the patient via MyChart unless otherwise noted below.     The patient was advised to call back or seek an in-person evaluation if the symptoms worsen or if the condition fails to improve as anticipated.  Time:  I spent 10 minutes with the patient via telehealth technology discussing the above problems/concerns.    Perlie Mayo, NP

## 2022-01-05 NOTE — Progress Notes (Signed)
DOSIA, YODICE ([id number]) [id number] Page 1 of 8 Visit Report for 01/04/2022 Arrival Information Details Patient Name: Date of Service: [name], [name]. 01/04/2022 9:00 A [name]: [id number] Patient Account Number: [id number] Date of Birth/Sex: Treating RN: [birth year] (46 y.o. [name] Primary Care Maecie Sevcik: [name] Other Clinician: [name] Referring Keishana Klinger: Treating Susie Pousson/Extender: [name] in Treatment: 19 Visit Information History Since Last Visit All ordered tests and consults were completed: No Patient Arrived: Ambulatory Added or deleted any medications: No Arrival Time: 09:11 Any new allergies or adverse reactions: No Transfer Assistance: None Had a fall or experienced change in No Patient Identification Verified: Yes activities of daily living that may affect Secondary Verification Process Completed: Yes risk of falls: Patient Requires Transmission-Based Precautions: No Signs or symptoms of abuse/neglect since No Patient Has Alerts: Yes last visito Patient Alerts: ABI R 1.15 02/04/21 Hospitalized since last visit: No ABI L 1.11 02/04/21 Has Dressing in Place as Prescribed: Yes Has Footwear/Offloading in Place as Yes Prescribed: Left: Surgical Shoe with Pressure Relief Insole Pain Present Now: No Electronic Signature(s) Signed: 01/05/2022 4:44:06 PM By: [name] Entered By: [name] on 01/04/2022 09:12:30 -------------------------------------------------------------------------------- Clinic Level of Care Assessment Details Patient Name: Date of Service: Kelly Penna. 01/04/2022 9:00 A [name]: [id number] Patient Account Number: [id number] Date of Birth/Sex: Treating RN: [birth year] (46 y.o. [name] Primary Care Beryl Hornberger: [name] Other Clinician: [name] Referring Renell Coaxum: Treating Kanai Hilger/Extender: [name] in Treatment: 19 Clinic Level of Care Assessment Items TOOL 1 Quantity Score _0  - 0 Use when EandM and Procedure is performed on INITIAL visit ASSESSMENTS - Nursing Assessment / Reassessment _1  - 0 General Physical Exam (combine w/ comprehensive assessment (listed just below) when performed on new pt. [address], [name] ([id number]) [id number] Page 2 of 8 _2  - 0 Comprehensive Assessment (HX, ROS, Risk Assessments, Wounds Hx, etc.) ASSESSMENTS - Wound and Skin Assessment / Reassessment _3  - 0 Dermatologic / Skin Assessment (not related to wound area) ASSESSMENTS - Ostomy and/or Continence Assessment and Care _4  - 0 Incontinence Assessment and Management _5  - 0 Ostomy Care Assessment and Management (repouching, etc.) PROCESS - Coordination of Care _6  - 0 Simple Patient / Family Education for ongoing care _7  - 0 Complex (extensive) Patient / Family Education for ongoing care _8  - 0 Staff obtains Programmer, systems, Records, T Results / Process Orders est _9  - 0 Staff telephones HHA, Nursing Homes / Clarify orders / etc _10  - 0 Routine Transfer to another Facility (non-emergent condition) _11  - 0 Routine Hospital Admission (non-emergent condition) _12  - 0 New Admissions / Biomedical engineer / Ordering NPWT Apligraf, etc. , _13  - 0 Emergency Hospital Admission (emergent condition) PROCESS - Special Needs _14  - 0 Pediatric / Minor Patient Management _15  - 0 Isolation Patient Management _16  - 0 Hearing / Language / Visual special needs _17  - 0 Assessment of Community assistance (transportation, D/C planning, etc.) _18  - 0 Additional assistance / Altered mentation _19  - 0 Support Surface(s) Assessment (bed, cushion, seat, etc.) INTERVENTIONS - Miscellaneous _20  - 0 External ear exam _21  - 0 Patient Transfer (multiple staff / Civil Service fast streamer / Similar devices) _22  - 0 Simple Staple / Suture removal (25 or less) _23  - 0 Complex  Staple / Suture removal (26 or more) _24  - 0 Hypo/Hyperglycemic Management (do not check if billed separately) _25  - 0 Ankle / Brachial Index (ABI) - do  not check if billed separately Has the patient been seen at the hospital within the last three years: Yes Total Score: 0 Level Of Care: ____ Electronic Signature(s) Signed: 01/05/2022 4:44:06 PM By: [name] Entered By: [name] on 01/04/2022 09:54:39 -------------------------------------------------------------------------------- Encounter Discharge Information Details Patient Name: Date of Service: [name], [name]. 01/04/2022 9:00 A [name]: [id number] Patient Account Number: [id number] Date of Birth/Sex: Treating RN: 09-19-1975 (46 y.o. [name] Primary Care Beauden Tremont: [name] Other Clinician: [name] [name], [name] ([id number]) [id number] Page 3 of 8 Referring Leili Eskenazi: Treating Merlin Ege/Extender: [name] in Treatment: 19 Encounter Discharge Information Items Post Procedure Vitals Discharge Condition: Stable Temperature (F): 98.8 Ambulatory Status: Ambulatory Pulse (bpm): 91 Discharge Destination: Home Respiratory Rate (breaths/min): 18 Transportation: Private Auto Blood Pressure (mmHg): 126/81 Accompanied By: self Schedule Follow-up Appointment: Yes Clinical Summary of Care: Electronic Signature(s) Signed: 01/05/2022 4:44:06 PM By: [name] Entered By: [name] on 01/04/2022 10:01:00 -------------------------------------------------------------------------------- Lower Extremity Assessment Details Patient Name: Date of Service: [name], [name]. 01/04/2022 9:00 A [name]: [id number] Patient Account Number: [id number] Date of Birth/Sex: Treating RN: [birth year] (46 y.o. [name] Primary Care Maisley Hainsworth: [name] Other Clinician: [name] Referring Amreen Raczkowski: Treating  Danaja Lasota/Extender: [name] in Treatment: 19 Vascular Assessment Pulses: Dorsalis Pedis Palpable: [Right:Yes] Electronic Signature(s) Signed: 01/04/2022 5:48:39 PM By: Gretta Cool BSN, RN, CWS, Kim RN, BSN Signed: 01/05/2022 4:44:06 PM By: [name] Entered By: [name] on 01/04/2022 09:26:04 -------------------------------------------------------------------------------- Multi Wound Chart Details Patient Name: Date of Service: [name], [name] 01/04/2022 9:00 A [name]: [id number] Patient Account Number: [id number] Date of Birth/Sex: Treating RN: 03-25-1975 (46 y.o. [name] Primary Care Shayaan Parke: [name] Other Clinician: [name] Referring Leelah Hanna: Treating Abron Neddo/Extender: [name] in Treatment: [address] Vital Signs [name], [name] ([id number]) [id number] Page 4 of 8 Height(in): 67 Pulse(bpm): 92 Weight(lbs): 153 Blood Pressure(mmHg): 126/81 Body Mass Index(BMI): 24 Temperature(F): 98.8 Respiratory Rate(breaths/min): 18 [2:Photos:] [N/A:N/A] Plantar Foot N/A N/A Wound Location: Trauma N/A N/A Wounding Event: Abscess N/A N/A Primary Etiology: Anemia, Asthma, History of pressure N/A N/A Comorbid History: wounds, Osteomyelitis, Neuropathy 06/18/2021 N/A N/A Date Acquired: 44 N/A N/A Weeks of Treatment: Open N/A N/A Wound Status: No N/A N/A Wound Recurrence: 0.1x0.5x0.3 N/A N/A Measurements L x W x D (cm) 0.039 N/A N/A A (cm) : rea 0.012 N/A N/A Volume (cm) : 94.50% N/A N/A % Reduction in A rea: 95.80% N/A N/A % Reduction in Volume: 2 Position 1 (o'[name]): 1.4 Maximum Distance 1 (cm): 12 Starting Position 1 (o'[name]): 4 Ending Position 1 (o'[name]): 0.5 Maximum Distance 1 (cm): Yes N/A N/A Tunneling: Yes N/A N/A Undermining: Full Thickness Without Exposed N/A N/A Classification: Support Structures Medium N/A  N/A Exudate Amount: Serosanguineous N/A N/A Exudate Type: red, brown N/A N/A Exudate Color: Large (67-100%) N/A N/A Granulation Amount: Pink N/A N/A Granulation Quality: None Present (0%) N/A N/A Necrotic Amount: Fat Layer (Subcutaneous Tissue): Yes N/A N/A Exposed Structures: Fascia: No Tendon: No Muscle: No Joint: No Bone: No Medium (34-66%) N/A N/A Epithelialization: Treatment Notes Electronic Signature(s) Signed: 01/05/2022 4:44:06 PM By: [name] Entered By: [name] on 01/04/2022 09:26:08 -------------------------------------------------------------------------------- Multi-Disciplinary Care Plan Details Patient Name: Date of Service: [name], [name]. 01/04/2022 9:00 A [name]: [id number] Patient Account Number: [id number] [name], [name] ([id number]) [id number] Page 5 of 8 Date of Birth/Sex: Treating  RN: 12-May-1975 (46 y.o. Charolette Forward, [name] Primary Care Jamelah Sitzer: [name] Other Clinician: [name] Referring Auburn Hester: Treating Wiatt Mahabir/Extender: [name] in Treatment: 19 Active Inactive Wound/Skin Impairment Nursing Diagnoses: Knowledge deficit related to ulceration/compromised skin integrity Goals: Patient/caregiver will verbalize understanding of skin care regimen Date Initiated: 08/20/2021 Target Resolution Date: 09/20/2021 Goal Status: Active Ulcer/skin breakdown will have a volume reduction of 30% by week 4 Date Initiated: 08/20/2021 Target Resolution Date: 09/20/2021 Goal Status: Active Ulcer/skin breakdown will have a volume reduction of 50% by week 8 Date Initiated: 08/20/2021 Target Resolution Date: 10/20/2021 Goal Status: Active Ulcer/skin breakdown will have a volume reduction of 80% by week 12 Date Initiated: 08/20/2021 Target Resolution Date: 11/20/2021 Goal Status: Active Ulcer/skin breakdown will heal within 14 weeks Date Initiated: 08/20/2021 Target  Resolution Date: 12/20/2021 Goal Status: Active Interventions: Assess patient/caregiver ability to obtain necessary supplies Assess patient/caregiver ability to perform ulcer/skin care regimen upon admission and as needed Assess ulceration(s) every visit Notes: Electronic Signature(s) Signed: 01/04/2022 5:48:39 PM By: Gretta Cool, BSN, RN, CWS, Kim RN, BSN Signed: 01/05/2022 4:44:06 PM By: [name] Entered By: [name] on 01/04/2022 10:00:19 -------------------------------------------------------------------------------- Pain Assessment Details Patient Name: Date of Service: [name], [name] 01/04/2022 9:00 A [name]: [id number] Patient Account Number: [id number] Date of Birth/Sex: Treating RN: [birth year] (46 y.o. [name] Primary Care Jovanny Stephanie: [name] Other Clinician: [name] Referring Nataleigh Griffin: Treating Brinson Tozzi/Extender: [name] in Treatment: 19 Active Problems Location of Pain Severity and Description of Pain Patient Has Paino No Site Locations [place], [name] [name] ([id number]) [id number] Page 6 of 8 Pain Management and Medication Current Pain Management: Electronic Signature(s) Signed: 01/04/2022 5:48:39 PM By: Gretta Cool, BSN, RN, CWS, Kim RN, BSN Signed: 01/05/2022 4:44:06 PM By: [name] Entered By: [name] on 01/04/2022 09:16:34 -------------------------------------------------------------------------------- Patient/Caregiver Education Details Patient Name: Date of Service: Kelly Penna 12/18/2023andnbsp9:00 A [name]: [id number] Patient Account Number: [id number] Date of Birth/Gender: Treating RN: 09-03-1975 (46 y.o. [name] Primary Care Physician: [name] Other Clinician: [name] Referring Physician: Treating Physician/Extender: [name] in Treatment: 19 Education Assessment Education  Provided To: Patient Education Topics Provided Wound/Skin Impairment: Handouts: Other: continue wound care as directed Methods: Explain/Verbal Responses: State content correctly Electronic Signature(s) Signed: 01/05/2022 4:44:06 PM By: [name] Entered By: [name] on 01/04/2022 10:00:15 [name], [name] ([id number]) [id number] Page 7 of 8 -------------------------------------------------------------------------------- Wound Assessment Details Patient Name: Date of Service: Kelly Penna. 01/04/2022 9:00 A [name]: [id number] Patient Account Number: [id number] Date of Birth/Sex: Treating RN: 11-30-1975 (46 y.o. Charolette Forward, [name] Primary Care Klye Besecker: [name] Other Clinician: [name] Referring Jaklyn Alen: Treating Vaunda Gutterman/Extender: [name] in Treatment: 19 Wound Status Wound Number: 2 Primary Abscess Etiology: Wound Location: Plantar Foot Wound Status: Open Wounding Event: Trauma Comorbid Anemia, Asthma, History of pressure wounds, Osteomyelitis, Date Acquired: 06/18/2021 History: Neuropathy Weeks Of Treatment: 19 Clustered Wound: No Photos Wound Measurements Length: (cm) 0.1 Width: (cm) 0.5 Depth: (cm) 0.3 Area: (cm) 0.039 Volume: (cm) 0.012 % Reduction in Area: 94.5% % Reduction in Volume: 95.8% Epithelialization: Medium (34-66%) Tunneling: Yes Position (o'[name]): 2 Maximum Distance: (cm) 1.4 Undermining: Yes Starting Position (o'[name]): 12 Ending Position (o'[name]): 4 Maximum Distance: (cm) 0.5 Wound Description Classification: Full Thickness Without Exposed Suppor Exudate Amount: Medium Exudate Type: Serosanguineous Exudate Color: red, brown t Structures Foul Odor After Cleansing: No Slough/Fibrino No  Wound Bed Granulation Amount: Large (67-100%) Exposed Structure Granulation Quality: Pink Fascia Exposed: No Necrotic Amount: None Present (0%) Fat Layer  (Subcutaneous Tissue) Exposed: Yes Tendon Exposed: No Muscle Exposed: No Joint Exposed: No Bone Exposed: No Treatment Notes [name], [name] ([id number]) [id number] Page 8 of 8 Wound #2 (Foot) Wound Laterality: Plantar Cleanser Byram Ancillary Kit - 15 Day Supply Discharge Instruction: Use supplies as instructed; Kit contains: (15) Saline Bullets; (15) 3x3 Gauze; 15 pr Gloves Soap and Water Discharge Instruction: Gently cleanse wound with antibacterial soap, rinse and pat dry prior to dressing wounds Peri-Wound Care Topical Primary Dressing Prisma 4.34 (in) Discharge Instruction: Moisten w/normal saline or sterile water; Cover wound as directed. Do not remove from wound bed. Secondary Dressing Zetuvit Plus 4x4 (in/in) Secured With Compression Wrap Compression Stockings Environmental education officer) Signed: 01/04/2022 5:48:39 PM By: Gretta Cool, BSN, RN, CWS, Kim RN, BSN Signed: 01/05/2022 4:44:06 PM By: [name] Entered By: [name] on 01/04/2022 09:25:27 -------------------------------------------------------------------------------- Vitals Details Patient Name: Date of Service: Kelly Clock, MA [name]. 01/04/2022 9:00 A [name]: [id number] Patient Account Number: [id number] Date of Birth/Sex: Treating RN: 1975-09-01 (46 y.o. Charolette Forward, [name] Primary Care Tarrie Mcmichen: [name] Other Clinician: [name] Referring Kymorah Korf: Treating Jalaina Salyers/Extender: [name] in Treatment: 19 Vital Signs Time Taken: 09:12 Temperature (F): 98.8 Height (in): 67 Pulse (bpm): 92 Weight (lbs): 153 Respiratory Rate (breaths/min): 18 Body Mass Index (BMI): 24 Blood Pressure (mmHg): 126/81 Reference Range: 80 - 120 mg / dl Electronic Signature(s) Signed: 01/05/2022 4:44:06 PM By: [name] Entered By: [name] on 01/04/2022 09:16:29

## 2022-01-15 NOTE — Progress Notes (Unsigned)
                [name], PhD

## 2022-01-19 ENCOUNTER — Encounter: Payer: Medicare HMO | Attending: Physician Assistant | Admitting: Physician Assistant

## 2022-01-19 DIAGNOSIS — M86671 Other chronic osteomyelitis, right ankle and foot: Secondary | ICD-10-CM | POA: Diagnosis not present

## 2022-01-19 DIAGNOSIS — Z89411 Acquired absence of right great toe: Secondary | ICD-10-CM | POA: Insufficient documentation

## 2022-01-19 DIAGNOSIS — L84 Corns and callosities: Secondary | ICD-10-CM | POA: Diagnosis not present

## 2022-01-19 DIAGNOSIS — F17210 Nicotine dependence, cigarettes, uncomplicated: Secondary | ICD-10-CM | POA: Insufficient documentation

## 2022-01-19 DIAGNOSIS — G7100 Muscular dystrophy, unspecified: Secondary | ICD-10-CM | POA: Diagnosis not present

## 2022-01-19 DIAGNOSIS — G600 Hereditary motor and sensory neuropathy: Secondary | ICD-10-CM | POA: Diagnosis not present

## 2022-01-19 DIAGNOSIS — L97512 Non-pressure chronic ulcer of other part of right foot with fat layer exposed: Secondary | ICD-10-CM | POA: Diagnosis not present

## 2022-01-19 DIAGNOSIS — Z89412 Acquired absence of left great toe: Secondary | ICD-10-CM | POA: Insufficient documentation

## 2022-01-19 NOTE — Progress Notes (Signed)
Kelly, [name] ([id number]) [id number] Page 1 of 8 Visit Report for 01/19/2022 Chief Complaint Document Details Patient Name: Date of Service: [name]. 01/19/2022 9:15 A M Medical Record Number: [id number] Patient Account Number: [id number] Date of Birth/Sex: Treating RN: 1975/08/05 (47 y.o. [name] Primary Care Provider: [name] Other Clinician: [name] Referring Provider: Treating Provider/Extender: [name] in Treatment: 21 Information Obtained from: Patient Chief Complaint Right foot ulcer Electronic Signature(s) Signed: 01/19/2022 9:48:22 AM By: [name] Entered By: Worthy Keeler on 01/19/2022 09:48:22 -------------------------------------------------------------------------------- HPI Details Patient Name: Date of Service: [name]. 01/19/2022 9:15 A M Medical Record Number: [id number] Patient Account Number: [id number] Date of Birth/Sex: Treating RN: Jun 19, 1975 (47 y.o. [name] Primary Care Provider: [name] Other Clinician: [name] Referring Provider: Treating Provider/Extender: [name] in Treatment: 21 History of Present Illness HPI Description: 47 year old patient sent to [name] by her PCP 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 Her notes were reviewed and there was extensive history of opioid treatment and management by the [facility] pain clinic from where she has been terminated. X-ray of the right foot foot was done on 03/13/2015 and was suggestive of active infection and possible osteomyelitis. her past surgical history includes amputation of the left big toe in 2011 and amputation of the right big toe in 2015. She was sitting the wound clinic at  [facility] and we will try and obtain these notes. She is a smoker and smokes about 15 cigarettes a day. 06/12/2015 - x-ray of the right foot -- IMPRESSION:1. Soft tissue swelling of the plantar surface of the foot. No underlying acute abnormality. 2. Amputation deformity again of the right great toe. Old healed bony deformity noted the right second metatarsal. No acute bony abnormality identified. We have not received any reports from the [facility] hospital yet. She continues to smoke at least 10 cigarettes a day 06/19/2015 -- she has increasing pain in the right foot and also has had some low-grade fever. Her vascular test is scheduled for tomorrow and her MRIs not till next week. Readmission: [name], Kelly ([id number]) [id number] Page 2 of 8 08-20-2021 upon evaluation today patient presents for initial inspection here in our clinic although she has been seen that was back in 2017. At that time she was having a issue with her right foot. With that being said upon inspection today she is actually having issues with the right foot as well on the medial aspect where she has some breakdown due to what I am assuming is pressure to the foot location. 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 I did review her culture report which showed evidence of bacteria including hemolytic Streptococcus group A as well as Staphylococcus aureus. The patient is currently on doxycycline and tells me that this is getting much better. T opical gentamicin is also being utilized. Also did review her arterial studies which show that she has on the right and ABI of 1.15 which is triphasic and appears to be excellent there is no significant peripheral artery disease noted the left is also. With ABI  of 1.11 and a good report in  that regard as well. Lastly I did also review her MRI of the right forefoot which was performed on 07-06-2021. This shows that she does have mild subcortical marrow edema in the stump of the first metatarsal concerning for osteomyelitis. There is also a malunited fracture of the second metatarsal. The patient has bone marrow edema in the base of the second third fourth and fifth metatarsals concerning for stress reaction obviously this infection is going to be an issue for [name] here as well she needs to be treated appropriately and I do believe that the doxycycline is a good option right now although I may need to #1 extend this and #2 plan for even altering or adding to the treatment regimen depending on how things progress. Patient voiced understanding.Marland Kitchen 09-01-2021 upon evaluation today patient appears to be doing excellent in regard to her wound. She is actually showing signs of excellent improvement which is great news. Fortunately I do not see any signs of active infection locally or systemically at this point which is great news and overall I am extremely pleased with where things stand today. I do not see any evidence of active infection locally or systemically. 09-07-2021 upon evaluation today patient appears to be doing well with regard to her foot ulcer. This is actually showing signs of improvement which is great news. Fortunately there does not appear to be any signs of active infection locally or systemically at this time which is as well. 09-22-2021 upon evaluation today patient appears to be doing well currently in regard to her wound. This is measuring smaller and looking better were slowly making progress here. Fortunately there does not appear to be any evidence of active infection locally or systemically at this time. Upon inspection patient's wound bed showed evidence of good granulation and epithelization at this point. 10-05-2021 upon evaluation today patient's wound shows a lot of callus  buildup but the surface of the wound is actually doing quite well. She is going require some sharp debridement to clearway some of the necrotic debris. 10-19-2021 upon evaluation today patient appears to be doing better in regard to her wound which is actually measuring little bit smaller today. Fortunately there does not appear to be any signs of significant infection she does have quite a bit of callus we will get have to work on that today. Otherwise I think we are headed in the appropriate direction. 11-09-2021 upon evaluation today patient appears to be doing well in regard to her wound from the standpoint of infection and actually appears to be doing better she actually just completed the doxycycline. With that being said I am hopeful that this took care of the osteomyelitis although that something that we will definitely keep a close eye on. With that being said I do believe that she is still having tremendous amount of callus buildup which I believe in turn is a result of her having issues here with Friction and callus buildup. With that being said I do believe that the patient would benefit here from a total contact cast I discussed that with her today. I do want to get more aggressive to get this closed as I think that if we do not then she is more likely to have a long standing issue here going forward. 11-17-2021 upon evaluation today patient appears to be doing well although her wound is still open. I do think that she is ready to be placed in a total  contact cast. She actually has come back on Thursday to have this changed out. Fortunately and overall I do not see any signs of worsening and I think that we should be able to get this on without any complication or problem.. 11-19-2021 upon evaluation today patient appears to be doing well currently in regard to her wound. She has been tolerating the dressing changes without complication and overall I am extremely pleased with where we stand  today. I do not see any evidence of active infection locally or systemically at this time which is great news. I think the total contact cast did an awesome job for her. 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 Only a very small area remains on the plantar foot. I had to use illumination to even see anything that looked open here. Much improved 12-03-2021 upon evaluation today patient appears to be doing well currently in regard to her wound. Of note she tells me that she did end up cutting off her cast with a pair of kitchen shears 2 nights ago. She states that she has been walking on it more than she should have after the cast was put on due to a friend who ended up in the hospital that it caught her right after she had a put on that same day. She feels like she probably deformed the cast that it was causing some pressure points that she was afraid to get a blister and did not want another wound. For that reason she did go ahead and cut the soft and does not appear to have caused any damage. She tells me that it only took 7 minutes. With that being said I do think the wound actually looks really good she has a lot of callus buildup I am going to remove that just to make sure nothing side underneath but other than that overall she seems to be doing quite well. 12-07-2021 upon evaluation today patient's wound is actually showing signs of excellent improvement and very pleased with where we stand I think we are headed in the right direction. Fortunately there does not appear to be any signs of active infection at this time which is great news. No fevers, chills, nausea, vomiting, or diarrhea. 01-14-2022 upon evaluation today patient appears to be doing well currently in regard to her wound she does have a bit of callus buildup unfortunately but otherwise does not seem to show any signs of active infection locally or systemically at  this time. Fortunately I do not see any evidence of infection locally or systemically which is great news. She does have a lot of callus buildup she has been very sick so has not been in to be seen recently. With that being said I do not think her wound is a whole lot worse than what it was before but it is also a lot more callused over so it is difficult to tell initially. 01-19-2022 upon evaluation today patient appears to be doing poorly unfortunately in regard to her wound. She has been tolerating the dressing changes without complication prior but unfortunately in the past week she has developed issues with redness and erythema on her foot with a new wound on the top of her foot which was not there last time I saw her. Unfortunately this seems to be getting significantly worse she is also had temperatures ranging up to 103 although it was 99.9 here in the office today it was not  nearly that bad. Fortunately I do not see any signs obvious of sepsis but nonetheless I am concerned about the significance of this infection I think she does need to probably get into the ER ASAP in order to be evaluated and treated appropriately based on what we are seeing. The patient is very worried about the situation here. With that being said she tells me that she has been wearing even prior to coming in today. Electronic Signature(s) Signed: 01/19/2022 10:16:23 AM By: Stone III, Hoyt [name] Entered By: Stone III, Hoyt on 01/19/2022 10:16:23 Wuellner, Maciel M ([id number]) [id number] Page 3 of 8 -------------------------------------------------------------------------------- Physical Exam Details Patient Name: Date of Service: NEEDHA M, MA TTIE M. 01/19/2022 9:15 A M Medical Record Number: [id number] Patient Account Number: [id number] Date of Birth/Sex: Treating RN: 06/07/1975 (46 y.o. F) Woody, Kim Primary Care Provider: Sonnenberg, Eric Other Clinician: Venable, Angie Referring  Provider: Treating Provider/Extender: Stone, Hoyt Sonnenberg, Eric Weeks in Treatment: 21 Constitutional Well-nourished and well-hydrated in no acute distress. Respiratory normal breathing without difficulty. Psychiatric this patient is able to make decisions and demonstrates good insight into disease process. Alert and Oriented x 3. pleasant and cooperative. Notes Upon inspection patient's wound bed again showed significant erythema and warmth to touch which does have been concerned at this point. I think she probably needs to go to the ER for further evaluation and treatment I think she probably is going require some IV antibiotic therapy and possibly imaging as well definitely lab work. Nonetheless I believe that this should be done as soon as possible. Electronic Signature(s) Signed: 01/19/2022 10:16:52 AM By: Stone III, Hoyt [name] Entered By: Stone III, Hoyt on 01/19/2022 10:16:52 -------------------------------------------------------------------------------- Physician Orders Details Patient Name: Date of Service: NEEDHA M, MA TTIE M. 01/19/2022 9:15 A M Medical Record Number: [id number] Patient Account Number: [id number] Date of Birth/Sex: Treating RN: 03/22/1975 (46 y.o. 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 Nurse Visit as needed Bathing/ Shower/ Hygiene May shower with  wound dressing protected with water repellent cover or cast protector. No tub bath. Edema Control - Lymphedema / Segmental Compressive Device / Other Elevate, Exercise Daily and A void Standing for Long Periods of Time. Elevate legs to the level of the heart and pump ankles as often as possible Elevate leg(s) parallel to the floor when sitting. Additional Orders / Instructions Other: - instructed to go to ED for IV antibiotics for infection right foot Medications-Please add to medication list. Wound #2 Plantar Foot Take one 500mg Tylenol (A cetaminophen) and one 200mg Motrin (Ibuprofen) every 6 hours for pain. Do not take ibuprofen if you are on blood thinners or have stomach ulcers. - take every 4 hours while awake ntibiotics - continue Doxycycline as directed P.O. A Wound Treatment Wound #2 - Foot Wound Laterality: Plantar Cleanser: Byram Ancillary Kit - 15 Day Supply (Generic) 3 x Per Day/30 Days Discharge Instructions: Use supplies as instructed; Kit contains: (15) Saline Bullets; (15) 3x3 Gauze; 15 pr Gloves Cleanser: Soap and Water 3 x Per Day/30 Days Discharge Instructions:   Gently cleanse wound with antibacterial soap, rinse and pat dry prior to dressing wounds Prim Dressing: Prisma 4.34 (in) 3 x Per Day/30 Days ary Discharge Instructions: Moisten w/normal saline or sterile water; Cover wound as directed. Do not remove from wound bed. Secondary Dressing: (BORDER) Zetuvit Plus SILICONE BORDER Dressing 4x4 (in/in) (DME) (Dispense As Written) 3 x Per Day/30 Days Discharge Instructions: Please do not put silicone bordered dressings under wraps. Use non-bordered dressing only. Wound #3 - Foot Wound Laterality: Dorsal Cleanser: Byram Ancillary Kit - 15 Day Supply (Generic) 3 x Per Day/30 Days Discharge Instructions: Use supplies as instructed; Kit contains: (15) Saline Bullets; (15) 3x3 Gauze; 15 pr Gloves Cleanser: Soap and Water 3 x Per Day/30 Days Discharge Instructions: Gently  cleanse wound with antibacterial soap, rinse and pat dry prior to dressing wounds Prim Dressing: Prisma 4.34 (in) 3 x Per Day/30 Days ary Discharge Instructions: Moisten w/normal saline or sterile water; Cover wound as directed. Do not remove from wound bed. Secondary Dressing: (BORDER) Zetuvit Plus SILICONE BORDER Dressing 4x4 (in/in) (DME) (Dispense As Written) 3 x Per Day/30 Days Discharge Instructions: Please do not put silicone bordered dressings under wraps. Use non-bordered dressing only. Electronic Signature(s) Unsigned Entered By: Venable, Angie on 01/19/2022 09:59:47 -------------------------------------------------------------------------------- Problem List Details Patient Name: Date of Service: NEEDHA M, MA TTIE M. 01/19/2022 9:15 A M Medical Record Number: [id number] Patient Account Number: [id number] Date of Birth/Sex: Treating RN: 11/27/1975 (46 y.o. 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 01/19/2022 9:15 A M Medical Record  Number: [id number] Patient Account Number: [id number] Date of Birth/Sex: Treating RN: 12/30/1975 (46 y.o. F) Woody, Kim Primary Care Provider: Sonnenberg, Eric Other Clinician: Venable, Angie Referring Provider: Treating Provider/Extender: Stone, Hoyt Sonnenberg, Eric Weeks in Treatment: 21 Subjective Chief Complaint Information obtained from Patient Right foot ulcer History of Present Illness (HPI) 40-year-old patient sent to us by her PCP Dr. David Marks who saw her recently on 05/15/2015 for chronic pain related to Charcot Marie tooth disease and subsequent problems with her feet with a history of bilateral great toe amputations for osteomyelitis. Her notes were reviewed and there was extensive history of opioid treatment and management by the [facility] pain clinic from where she has been terminated. X-ray of the right foot foot was done on 03/13/2015 and was suggestive of active infection and possible osteomyelitis. her past surgical history includes amputation of the left big toe in 2011 and amputation of the right big toe in 2015. She was sitting the wound clinic at [place] and we will try and obtain these notes. She is a smoker and smokes about 15 cigarettes a day. Stanislaw, Janye M ([id number]) [id number] Page 6 of 8 06/12/2015 - x-ray of the right foot -- IMPRESSION:1. Soft tissue swelling of the plantar surface   of the foot. No underlying acute abnormality. 2. Amputation deformity again of the right great toe. Old healed bony deformity noted the right second metatarsal. No acute bony abnormality identified. We have not received any reports from the [facility] hospital yet. She continues to smoke at least 10 cigarettes a day 06/19/2015 -- she has increasing pain in the right foot and also has had some low-grade fever. Her vascular test is scheduled for tomorrow and her MRIs not till next week. Readmission: 08-20-2021 upon evaluation today patient presents for  initial inspection here in our clinic although she has been seen that was back in 2017. At that time she was having a issue with her right foot. With that being said upon inspection today she is actually having issues with the right foot as well on the medial aspect where she has some breakdown due to what I am assuming is pressure to the foot location. 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 I did review her culture report which showed evidence of bacteria including hemolytic Streptococcus group A as well as Staphylococcus aureus. The patient is currently on doxycycline and tells me that this is getting much better. T opical gentamicin is also being utilized. Also did review her arterial studies which show that she has on the right and ABI of 1.15 which is triphasic and appears to be excellent there is no significant peripheral artery disease noted the left is also. With ABI of 1.11 and a good report in that regard as well. Lastly I did also review her MRI of the right forefoot which was performed on 07-06-2021. This shows that she does have mild subcortical marrow edema in the stump of the first metatarsal concerning for osteomyelitis. There is also a malunited fracture of the second metatarsal. The patient has bone marrow edema in the base of the second third fourth and fifth metatarsals concerning for stress reaction obviously this infection is going to be an issue for us here as well she needs to be treated appropriately and I do believe that the doxycycline is a good option right now although I may need to #1 extend this and #2 plan for even altering or adding to the treatment regimen depending on how things progress. Patient voiced understanding.. 09-01-2021 upon evaluation today patient appears to be doing excellent in  regard to her wound. She is actually showing signs of excellent improvement which is great news. Fortunately I do not see any signs of active infection locally or systemically at this point which is great news and overall I am extremely pleased with where things stand today. I do not see any evidence of active infection locally or systemically. 09-07-2021 upon evaluation today patient appears to be doing well with regard to her foot ulcer. This is actually showing signs of improvement which is great news. Fortunately there does not appear to be any signs of active infection locally or systemically at this time which is as well. 09-22-2021 upon evaluation today patient appears to be doing well currently in regard to her wound. This is measuring smaller and looking better were slowly making progress here. Fortunately there does not appear to be any evidence of active infection locally or systemically at this time. Upon inspection patient's wound bed showed evidence of good granulation and epithelization at this point. 10-05-2021 upon evaluation   today patient's wound shows a lot of callus buildup but the surface of the wound is actually doing quite well. She is going require some sharp debridement to clearway some of the necrotic debris. 10-19-2021 upon evaluation today patient appears to be doing better in regard to her wound which is actually measuring little bit smaller today. Fortunately there does not appear to be any signs of significant infection she does have quite a bit of callus we will get have to work on that today. Otherwise I think we are headed in the appropriate direction. 11-09-2021 upon evaluation today patient appears to be doing well in regard to her wound from the standpoint of infection and actually appears to be doing better she actually just completed the doxycycline. With that being said I am hopeful that this took care of the osteomyelitis although that something that we will  definitely keep a close eye on. With that being said I do believe that she is still having tremendous amount of callus buildup which I believe in turn is a result of her having issues here with Friction and callus buildup. With that being said I do believe that the patient would benefit here from a total contact cast I discussed that with her today. I do want to get more aggressive to get this closed as I think that if we do not then she is more likely to have a long standing issue here going forward. 11-17-2021 upon evaluation today patient appears to be doing well although her wound is still open. I do think that she is ready to be placed in a total contact cast. She actually has come back on Thursday to have this changed out. Fortunately and overall I do not see any signs of worsening and I think that we should be able to get this on without any complication or problem.. 11-19-2021 upon evaluation today patient appears to be doing well currently in regard to her wound. She has been tolerating the dressing changes without complication and overall I am extremely pleased with where we stand today. I do not see any evidence of active infection locally or systemically at this time which is great news. I think the total contact cast did an awesome job for her. 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 Only a very small area remains on the plantar foot. I had to use illumination to even see anything that looked open here. Much improved 12-03-2021 upon evaluation today patient appears to be doing well currently in regard to her wound. Of note she tells me that she did end up cutting off her cast with a pair of kitchen shears 2 nights ago. She states that she has been walking on it more than she should have after the cast was put on due to a friend who ended up in the hospital that it caught her right after she had a put on that same day.  She feels like she probably deformed the cast that it was causing some pressure points that she was afraid to get a blister and did not want another wound. For that reason she did go ahead and cut the soft and does not appear to have caused any damage. She tells me that it only took 7 minutes. With that being said I do think the wound actually looks really good she has a lot of callus buildup I am going to remove that just to make sure nothing side   underneath but other than that overall she seems to be doing quite well. 12-07-2021 upon evaluation today patient's wound is actually showing signs of excellent improvement and very pleased with where we stand I think we are headed in the right direction. Fortunately there does not appear to be any signs of active infection at this time which is great news. No fevers, chills, nausea, vomiting, or diarrhea. 01-14-2022 upon evaluation today patient appears to be doing well currently in regard to her wound she does have a bit of callus buildup unfortunately but otherwise does not seem to show any signs of active infection locally or systemically at this time. Fortunately I do not see any evidence of infection locally or systemically which is great news. She does have a lot of callus buildup she has been very sick so has not been in to be seen recently. With that being said I do not think her wound is a whole lot worse than what it was before but it is also a lot more callused over so it is difficult to tell initially. 01-19-2022 upon evaluation today patient appears to be doing poorly unfortunately in regard to her wound. She has been tolerating the dressing changes without complication prior but unfortunately in the past week she has developed issues with redness and erythema on her foot with a new wound on the top of her foot which was not there last time I saw her. Unfortunately this seems to be getting significantly worse she is also had temperatures ranging  up to 103 although it was 99.9 here in the office today it was not nearly that bad. Fortunately I do not see any signs obvious of sepsis but nonetheless I am concerned about the significance of this infection I think she does need to probably get into the ER ASAP in order to be evaluated and treated appropriately based on what we are seeing. The patient is very worried about the situation here. With that being said she tells me that she has been wearing even prior to coming in today. Objective Reinecke, Kadey M ([id number]) [id number] Page 7 of 8 Constitutional Well-nourished and well-hydrated in no acute distress. Vitals Time Taken: 9:28 AM, Height: 67 in, Weight: 153 lbs, BMI: 24, Temperature: 99.4 °F, Pulse: 86 bpm, Respiratory Rate: 16 breaths/min, Blood Pressure: 114/72 mmHg. Respiratory normal breathing without difficulty. Psychiatric this patient is able to make decisions and demonstrates good insight into disease process. Alert and Oriented x 3. pleasant and cooperative. General Notes: Upon inspection patient's wound bed again showed significant erythema and warmth to touch which does have been concerned at this point. I think she probably needs to go to the ER for further evaluation and treatment I think she probably is going require some IV antibiotic therapy and possibly imaging as well definitely lab work. Nonetheless I believe that this should be done as soon as possible. Integumentary (Hair, Skin) Wound #2 status is Open. Original cause of wound was Trauma. The date acquired was: 06/18/2021. The wound has been in treatment 21 weeks. The wound is located on the Plantar Foot. The wound measures 1cm length x 3cm width x 0.1cm depth; 2.356cm^2 area and 0.236cm^3 volume. There is Fat Layer (Subcutaneous Tissue) exposed. There is a medium amount of serosanguineous drainage noted. There is large (67-100%) pink granulation within the wound bed. There is no  necrotic tissue within the wound bed. Wound #3 status is Open. Original cause of wound was Blister. The date acquired was: 01/13/2022. The   wound is located on the Dorsal Foot. The wound measures 23.3cm length x 2.5cm width x 0.2cm depth; 45.749cm^2 area and 9.15cm^3 volume. There is Fat Layer (Subcutaneous Tissue) exposed. There is no tunneling or undermining noted. There is a medium amount of serosanguineous drainage noted. The wound margin is flat and intact. 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 Nurse Visit as needed Bathing/ Shower/ Hygiene: May shower with wound dressing protected with water repellent cover or cast protector. No tub bath. Edema Control - Lymphedema / Segmental Compressive Device / Other: Elevate, Exercise Daily and Avoid Standing for Long Periods of Time. Elevate legs to the level of the heart and pump ankles as often as possible Elevate leg(s) parallel to the floor when sitting. Additional Orders / Instructions: Other: - instructed to go to ED for IV antibiotics for infection right foot Medications-Please add to medication list.: Wound #2 Plantar Foot: T one 500mg Tylenol (Acetaminophen) and one 200mg Motrin (Ibuprofen) every 6 hours for pain. Do not take ibuprofen if you are on blood thinners or ake have stomach ulcers. - take every 4 hours while awake P.O. Antibiotics - continue Doxycycline as directed WOUND #2: - Foot Wound Laterality: Plantar Cleanser: Byram Ancillary Kit - 15 Day Supply (Generic) 3 x Per Day/30 Days Discharge Instructions: Use supplies as instructed; Kit contains: (15) Saline Bullets; (15) 3x3 Gauze; 15 pr Gloves Cleanser: Soap and Water 3 x Per Day/30  Days Discharge Instructions: Gently cleanse wound with antibacterial soap, rinse and pat dry prior to dressing wounds Prim Dressing: Prisma 4.34 (in) 3 x Per Day/30 Days ary Discharge Instructions: Moisten w/normal saline or sterile water; Cover wound as directed. Do not remove from wound bed. Secondary Dressing: (BORDER) Zetuvit Plus SILICONE BORDER Dressing 4x4 (in/in) (DME) (Dispense As Written) 3 x Per Day/30 Days Discharge Instructions: Please do not put silicone bordered dressings under wraps. Use non-bordered dressing only. WOUND #3: - Foot Wound Laterality: Dorsal Cleanser: Byram Ancillary Kit - 15 Day Supply (Generic) 3 x Per Day/30 Days Discharge Instructions: Use supplies as instructed; Kit contains: (15) Saline Bullets; (15) 3x3 Gauze; 15 pr Gloves Cleanser: Soap and Water 3 x Per Day/30 Days Discharge Instructions: Gently cleanse wound with antibacterial soap, rinse and pat dry prior to dressing wounds Prim Dressing: Prisma 4.34 (in) 3 x Per Day/30 Days ary Discharge Instructions: Moisten w/normal saline or sterile water; Cover wound as directed. Do not remove from wound bed. Secondary Dressing: (BORDER) Zetuvit Plus SILICONE BORDER Dressing 4x4 (in/in) (DME) (Dispense As Written) 3 x Per Day/30 Days Discharge Instructions: Please do not put silicone bordered dressings under wraps. Use non-bordered dressing only. Lamping, Bailynn M ([id number]) [id number] Page 8 of 8 1. I am going to have the patient go to the ER for further evaluation and treatment ASAP. I am given a copy of this note today to take with her as well again this does have me concerned due to the severity of the infection coupled with her temperatures which she tells me has ranged up to around 103. She tells me she has had a cold as well but I am not really sure if that is affecting things especially considering how significant the infection looks on the foot currently. 2. I am good recommend  that she should otherwise continue with   ABD pad and roll gauze for protection while using Prisma generally although right now I did not put that on today's that she is going to the ER and they will remove the dressing. Will see her back for follow-up visit following the ER evaluation. Electronic Signature(s) Signed: 01/19/2022 10:17:45 AM By: [name] Entered By: Worthy Keeler on 01/19/2022 10:17:44 -------------------------------------------------------------------------------- SuperBill Details Patient Name: Date of Service: [name]. 01/19/2022 Medical Record Number: [id number] Patient Account Number: [id number] Date of Birth/Sex: Treating RN: [birth year] (47 y.o. [name] Primary Care Provider: [name] Other Clinician: [name] Referring Provider: Treating Provider/Extender: [name] in Treatment: 21 Diagnosis Coding ICD-10 Codes Code Description [phone] Other chronic osteomyelitis, right ankle and foot L97.512 Non-pressure chronic ulcer of other part of right foot with fat layer exposed G71.00 Muscular dystrophy, unspecified G60.0 Hereditary motor and sensory neuropathy L84 Corns and callosities Facility Procedures : CPT4 Code: [id number] Description: 99214 - WOUND CARE VISIT-LEV 4 EST PT Modifier: Quantity: 1 Physician Procedures : CPT4 Code Description Modifier [id number] - WC PHYS LEVEL 5 - EST PT ICD-10 Diagnosis Description M86.671 Other chronic osteomyelitis, right ankle and foot L97.512 Non-pressure chronic ulcer of other part of right foot with fat layer exposed G71.00  Muscular dystrophy, unspecified G60.0 Hereditary motor and sensory neuropathy Quantity: 1 Electronic Signature(s) Signed: 01/19/2022 10:17:59 AM By: [name] Entered By: Worthy Keeler on 01/19/2022 10:17:58

## 2022-01-20 DIAGNOSIS — M86671 Other chronic osteomyelitis, right ankle and foot: Secondary | ICD-10-CM | POA: Diagnosis not present

## 2022-01-20 DIAGNOSIS — G600 Hereditary motor and sensory neuropathy: Secondary | ICD-10-CM | POA: Diagnosis not present

## 2022-01-20 DIAGNOSIS — L84 Corns and callosities: Secondary | ICD-10-CM | POA: Diagnosis not present

## 2022-01-20 DIAGNOSIS — G7100 Muscular dystrophy, unspecified: Secondary | ICD-10-CM | POA: Diagnosis not present

## 2022-01-20 DIAGNOSIS — L97512 Non-pressure chronic ulcer of other part of right foot with fat layer exposed: Secondary | ICD-10-CM | POA: Diagnosis not present

## 2022-01-20 NOTE — Progress Notes (Signed)
MCKENA, CHERN ([id number]) [id number] Page 1 of 10 Visit Report for 01/19/2022 Arrival Information Details Patient Name: Date of Service: [name]. 01/19/2022 9:15 A M Medical Record Number: [id number] Patient Account Number: [id number] Date of Birth/Sex: Treating RN: 01-08-1976 (47 y.o. [name] Primary Care Araceli Arango: [name] Other Clinician: [name] Referring Emmajo Bennette: Treating Emily Forse/Extender: [name] in Treatment: 21 Visit Information History Since Last Visit All ordered tests and consults were completed: No Patient Arrived: Ambulatory Added or deleted any medications: No Arrival Time: 09:26 Any new allergies or adverse reactions: No Transfer Assistance: None Had a fall or experienced change in No Patient Identification Verified: Yes activities of daily living that may affect Secondary Verification Process Completed: Yes risk of falls: Patient Requires Transmission-Based Precautions: No Signs or symptoms of abuse/neglect since last visito No Patient Has Alerts: Yes Hospitalized since last visit: No Patient Alerts: ABI R 1.15 02/04/21 Implantable device outside of the clinic excluding No ABI L 1.11 02/04/21 cellular tissue based products placed in the center since last visit: Has Dressing in Place as Prescribed: Yes Has Footwear/Offloading in Place as Prescribed: Yes Left: Wedge Shoe Pain Present Now: Yes Electronic Signature(s) Signed: 01/20/2022 2:03:30 PM By: [name] Entered By: [name] on 01/19/2022 09:27:46 -------------------------------------------------------------------------------- Clinic Level of Care Assessment Details Patient Name: Date of Service: [name]. 01/19/2022 9:15 A M Medical Record Number: [id number] Patient Account Number: [id number] Date of Birth/Sex: Treating RN: May 19, 1975 (47 y.o. [name] Primary Care Vayla Wilhelmi: [name] Other Clinician: [name] Referring Shaili Donalson: Treating Atiyah Bauer/Extender: [name] in Treatment: 21 Clinic Level of Care Assessment Items TOOL 4 Quantity Score _0  - 0 Use when only an EandM is performed on FOLLOW-UP visit ASSESSMENTS - Nursing Assessment / Reassessment X- 1 10 Reassessment of Co-morbidities (includes updates in patient status) Coldren, Tamiah M ([id number]) [id number] Page 2 of 10 X- 1 5 Reassessment of Adherence to Treatment Plan ASSESSMENTS - Wound and Skin A ssessment / Reassessment _1  - 0 Simple Wound Assessment / Reassessment - one wound X- 2 5 Complex Wound Assessment / Reassessment - multiple wounds _2  - 0 Dermatologic / Skin Assessment (not related to wound area) ASSESSMENTS - Focused Assessment _3  - 0 Circumferential Edema Measurements - multi extremities _4  - 0 Nutritional Assessment / Counseling / Intervention _5  - 0 Lower Extremity Assessment (monofilament, tuning fork, pulses) _6  - 0 Peripheral Arterial Disease Assessment (using hand held doppler) ASSESSMENTS - Ostomy and/or Continence Assessment and Care _7  - 0 Incontinence Assessment and Management _8  - 0 Ostomy Care Assessment and Management (repouching, etc.) PROCESS - Coordination of Care X - Simple Patient / Family Education for ongoing care 1 15 _9  - 0 Complex (extensive) Patient / Family Education for ongoing care _10  - 0 Staff obtains Programmer, systems, Records, T Results / Process Orders est _11  - 0 Staff telephones HHA, Nursing Homes / Clarify orders / etc _12  - 0 Routine Transfer to another Facility (non-emergent condition) _13  - 0 Routine Hospital Admission (non-emergent condition) _14  - 0 New Admissions / Biomedical engineer / Ordering NPWT Apligraf, etc. , X- 1 20 Emergency Hospital Admission (emergent condition) X- 1 10 Simple Discharge Coordination _15  - 0 Complex (extensive) Discharge Coordination PROCESS -  Special Needs _16  - 0 Pediatric / Minor Patient Management _17  - 0 Isolation Patient Management _18  - 0 Hearing / Language / Visual special needs _19  - 0 Assessment of Community assistance (transportation, D/C planning,  etc.) _0  - 0 Additional assistance / Altered mentation _1  - 0 Support Surface(s) Assessment (bed, cushion, seat, etc.) INTERVENTIONS - Wound Cleansing / Measurement _2  - 0 Simple Wound Cleansing - one wound X- 2 5 Complex Wound Cleansing - multiple wounds X- 1 5 Wound Imaging (photographs - any number of wounds) _3  - 0 Wound Tracing (instead of photographs) _4  - 0 Simple Wound Measurement - one wound X- 2 5 Complex Wound Measurement - multiple wounds INTERVENTIONS - Wound Dressings _5  - 0 Small Wound Dressing one or multiple wounds X- 2 15 Medium Wound Dressing one or multiple wounds _6  - 0 Large Wound Dressing one or multiple wounds <DQQIWLNLGXQJJHER>_7<\/EYCXKGYJEHUDJSHF>_0  - 0 Application of Medications - topical <YOVZCHYIFOYDXAJO>_8<\/NOMVEHMCNOBSJGGE>_3  - 0 Application of Medications - injection Hislop, Staisha M ([id number]) [id number] Page 3 of 10 INTERVENTIONS - Miscellaneous _9  - 0 External ear exam _10  - 0 Specimen Collection (cultures, biopsies, blood, body fluids, etc.) _11  - 0 Specimen(s) / Culture(s) sent or taken to Lab for analysis _12  - 0 Patient Transfer (multiple staff / Harrel Lemon Lift / Similar devices) _13  - 0 Simple Staple / Suture removal (25 or less) _14  - 0 Complex Staple / Suture removal (26 or more) _15  - 0 Hypo / Hyperglycemic Management (close monitor of Blood Glucose) _16  - 0 Ankle / Brachial Index (ABI) - do not check if billed separately X- 1 5 Vital Signs Has the patient been seen at the hospital within the last three years: Yes Total Score: 130 Level Of Care: New/Established - Level 4 Electronic Signature(s) Signed: 01/20/2022 2:03:30 PM By: [name] Entered By: [name] on 01/19/2022  09:59:55 -------------------------------------------------------------------------------- Encounter Discharge Information Details Patient Name: Date of Service: [name], Brenton Grills M. 01/19/2022 9:15 A M Medical Record Number: [id number] Patient Account Number: [id number] Date of Birth/Sex: Treating RN: 1975/09/17 (47 y.o. Charolette Forward, Kim Primary Care Tobe Kervin: [name] Other Clinician: [name] Referring Orit Sanville: Treating Gearldine Looney/Extender: [name] in Treatment: 21 Encounter Discharge Information Items Discharge Condition: Stable Ambulatory Status: Ambulatory Discharge Destination: Home Transportation: Private Auto Accompanied By: self Schedule Follow-up Appointment: Yes Clinical Summary of Care: Electronic Signature(s) Signed: 01/20/2022 2:03:30 PM By: [name] Entered By: [name] on 01/19/2022 10:08:45 Julius, Rainee M ([id number]) [id number] Page 4 of 10 -------------------------------------------------------------------------------- Lower Extremity Assessment Details Patient Name: Date of Service: [name]. 01/19/2022 9:15 A M Medical Record Number: [id number] Patient Account Number: [id number] Date of Birth/Sex: Treating RN: 13-Aug-1975 (47 y.o. [name] Primary Care Shontay Wallner: [name] Other Clinician: [name] Referring Tieler Cournoyer: Treating Kayin Kettering/Extender: [name] in Treatment: 21 Vascular Assessment Pulses: Dorsalis Pedis Palpable: [Right:Yes] Electronic Signature(s) Signed: 01/20/2022 2:03:30 PM By: [name] Signed: 01/20/2022 4:52:31 PM By: Gretta Cool BSN, RN, CWS, Kim RN, BSN Entered By: [name] on 01/19/2022 09:46:17 -------------------------------------------------------------------------------- Multi Wound Chart Details Patient Name: Date of Service: [name], [name] 01/19/2022 9:15 A M Medical Record Number:  [id number] Patient Account Number: [id number] Date of Birth/Sex: Treating RN: 18-Jul-1975 (46 y.o. [name] Primary Care Alessandria Henken: [name] Other Clinician: [name] Referring Valgene Deloatch: Treating Jigar Zielke/Extender: [name] in Treatment: 21 Vital Signs Height(in): 67 Pulse(bpm): 86 Weight(lbs): 153 Blood Pressure(mmHg): 114/72 Body Mass Index(BMI): 24 Temperature(F): 99.4 Respiratory Rate(breaths/min): 16 [2:Photos:] [N/A:N/A] Plantar Foot Dorsal Foot N/A Wound Location: Trauma Blister N/A Wounding Event: Abscess T be determined o N/A Primary Etiology: Anemia, Asthma, History of pressure Anemia, Asthma, History of pressure N/A Comorbid History:  wounds, Osteomyelitis, Neuropathy wounds, Osteomyelitis, Neuropathy 06/18/2021 01/13/2022 N/A Date Acquired: 21 0 N/A Weeks of Treatment: Open Open N/A Wound Status: No No N/A Wound Recurrence: 1x3x0.1 23.3x2.5x0.2 N/A Measurements L x W x D (cm) 2.356 45.749 N/A A (cm) : rea 0.236 9.15 N/A Volume (cm) : -233.20% N/A N/A % Reduction in Area: 16.60% N/A N/A % Reduction in Volume: Colmenares, Tanelle M ([id number]) [id number] Page 5 of 10 Full Thickness Without Exposed Full Thickness Without Exposed N/A Classification: Support Structures Support Structures Medium Medium N/A Exudate Amount: Serosanguineous Serosanguineous N/A Exudate Type: red, brown red, brown N/A Exudate Color: N/A Flat and Intact N/A Wound Margin: Large (67-100%) N/A N/A Granulation Amount: Pink N/A N/A Granulation Quality: None Present (0%) N/A N/A Necrotic Amount: Fat Layer (Subcutaneous Tissue): Yes Fat Layer (Subcutaneous Tissue): Yes N/A Exposed Structures: Fascia: No Fascia: No Tendon: No Tendon: No Muscle: No Muscle: No Joint: No Joint: No Bone: No Bone: No Medium (34-66%) None N/A Epithelialization: Treatment Notes Electronic Signature(s) Signed: 01/20/2022  2:03:30 PM By: [name] Entered By: [name] on 01/19/2022 09:46:26 -------------------------------------------------------------------------------- Multi-Disciplinary Care Plan Details Patient Name: Date of Service: [name], [name] 01/19/2022 9:15 A M Medical Record Number: [id number] Patient Account Number: [id number] Date of Birth/Sex: Treating RN: 1975/04/16 (47 y.o. Charolette Forward, Kim Primary Care Gabriele Loveland: [name] Other Clinician: [name] Referring Malcome Ambrocio: Treating Hindy Perrault/Extender: [name] in Treatment: 21 Active Inactive Wound/Skin Impairment Nursing Diagnoses: Knowledge deficit related to ulceration/compromised skin integrity Goals: Patient/caregiver will verbalize understanding of skin care regimen Date Initiated: 08/20/2021 Target Resolution Date: 09/20/2021 Goal Status: Active Ulcer/skin breakdown will have a volume reduction of 30% by week 4 Date Initiated: 08/20/2021 Target Resolution Date: 09/20/2021 Goal Status: Active Ulcer/skin breakdown will have a volume reduction of 50% by week 8 Date Initiated: 08/20/2021 Target Resolution Date: 10/20/2021 Goal Status: Active Ulcer/skin breakdown will have a volume reduction of 80% by week 12 Date Initiated: 08/20/2021 Target Resolution Date: 11/20/2021 Goal Status: Active Ulcer/skin breakdown will heal within 14 weeks Date Initiated: 08/20/2021 Target Resolution Date: 12/20/2021 Goal Status: Active Interventions: Assess patient/caregiver ability to obtain necessary supplies Assess patient/caregiver ability to perform ulcer/skin care regimen upon admission and as needed Assess ulceration(s) every visit ZIYON, CEDOTAL ([id number]) [id number] Page 6 of 10 Notes: Electronic Signature(s) Signed: 01/20/2022 2:03:30 PM By: [name] Signed: 01/20/2022 4:52:31 PM By: Gretta Cool, BSN, RN, CWS, Kim RN, BSN Entered By: [name] on 01/19/2022  10:08:01 -------------------------------------------------------------------------------- Pain Assessment Details Patient Name: Date of Service: [name], [name] 01/19/2022 9:15 A M Medical Record Number: [id number] Patient Account Number: [id number] Date of Birth/Sex: Treating RN: September 26, 1975 (47 y.o. [name] Primary Care Earleen Aoun: [name] Other Clinician: [name] Referring Stanton Kissoon: Treating Esaias Cleavenger/Extender: [name] in Treatment: 21 Active Problems Location of Pain Severity and Description of Pain Patient Has Paino Yes Site Locations Pain Location: Pain in Ulcers Duration of the Pain. Constant / Intermittento Intermittent Rate the pain. Current Pain Level: 7 Character of Pain Describe the Pain: Sharp, Shooting Pain Management and Medication Current Pain Management: Medication: Yes Cold Application: No Rest: No Massage: No Activity: No T.E.N.S.: No Heat Application: No Leg drop or elevation: No Is the Current Pain Management Adequate: Inadequate How does your wound impact your activities of daily livingo Sleep: No Bathing: No Appetite: No Relationship With Others: No Bladder Continence: No Emotions: No Bowel Continence: No Work: No Toileting: No Drive: No Dressing: No Hobbies: No Electronic  Signature(s) KANDI, BRUSSEAU ([id number]) [id number] Page 7 of 10 Signed: 01/20/2022 2:03:30 PM By: [name] Signed: 01/20/2022 4:52:31 PM By: Gretta Cool, BSN, RN, CWS, Kim RN, BSN Entered By: [name] on 01/19/2022 09:30:36 -------------------------------------------------------------------------------- Patient/Caregiver Education Details Patient Name: Date of Service: [name] 1/2/2024andnbsp9:15 A M Medical Record Number: [id number] Patient Account Number: [id number] Date of Birth/Gender: Treating RN: 01-29-1975 (47 y.o. [name] Primary Care Physician: [name] Other Clinician: [name] Referring Physician: Treating Physician/Extender: [name] in Treatment: 21 Education Assessment Education Provided To: Patient Education Topics Provided Wound/Skin Impairment: Handouts: Other: continue wound care as directed Methods: Explain/Verbal Responses: State content correctly Electronic Signature(s) Signed: 01/20/2022 2:03:30 PM By: [name] Entered By: [name] on 01/19/2022 10:07:55 -------------------------------------------------------------------------------- Wound Assessment Details Patient Name: Date of Service: [name], [name] 01/19/2022 9:15 A M Medical Record Number: [id number] Patient Account Number: [id number] Date of Birth/Sex: Treating RN: 01/25/1975 (47 y.o. [name] Primary Care Helmi Hechavarria: [name] Other Clinician: [name] Referring Ercell Perlman: Treating Lealer Marsland/Extender: [name] in Treatment: 21 Wound Status Wound Number: 2 Primary Abscess Etiology: Wound Location: Plantar Foot Wound Status: Open Wounding Event: Trauma Comorbid Anemia, Asthma, History of pressure wounds, Osteomyelitis, Date Acquired: 06/18/2021 History: Neuropathy Weeks Of Treatment: 21 Clustered Wound: No Haye, Kairah M ([id number]) [id number] Page 8 of 10 Photos Wound Measurements Length: (cm) 1 Width: (cm) 3 Depth: (cm) 0.1 Area: (cm) 2.356 Volume: (cm) 0.236 % Reduction in Area: -233.2% % Reduction in Volume: 16.6% Epithelialization: Medium (34-66%) Wound Description Classification: Full Thickness Without Exposed Support Structures Exudate Amount: Medium Exudate Type: Serosanguineous Exudate Color: red, brown Foul Odor After Cleansing: No Slough/Fibrino No Wound Bed Granulation Amount: Large (67-100%) Exposed Structure Granulation Quality: Pink Fascia Exposed: No Necrotic Amount: None Present (0%) Fat Layer  (Subcutaneous Tissue) Exposed: Yes Tendon Exposed: No Muscle Exposed: No Joint Exposed: No Bone Exposed: No Treatment Notes Wound #2 (Foot) Wound Laterality: Plantar Cleanser Byram Ancillary Kit - 15 Day Supply Discharge Instruction: Use supplies as instructed; Kit contains: (15) Saline Bullets; (15) 3x3 Gauze; 15 pr Gloves Soap and Water Discharge Instruction: Gently cleanse wound with antibacterial soap, rinse and pat dry prior to dressing wounds Peri-Wound Care Topical Primary Dressing Prisma 4.34 (in) Discharge Instruction: Moisten w/normal saline or sterile water; Cover wound as directed. Do not remove from wound bed. Secondary Dressing (BORDER) Zetuvit Plus SILICONE BORDER Dressing 4x4 (in/in) Discharge Instruction: Please do not put silicone bordered dressings under wraps. Use non-bordered dressing only. Secured With Compression Wrap Compression Stockings Environmental education officer) Signed: 01/20/2022 2:03:30 PM By: [name] Signed: 01/20/2022 4:52:31 PM By: Gretta Cool, BSN, RN, CWS, Kim RN, BSN Entered By: [name] on 01/19/2022 09:41:27 Olivencia, Ysabel M ([id number]) [id number] Page 9 of 10 -------------------------------------------------------------------------------- Wound Assessment Details Patient Name: Date of Service: [name]. 01/19/2022 9:15 A M Medical Record Number: [id number] Patient Account Number: [id number] Date of Birth/Sex: Treating RN: 1975/09/05 (47 y.o. Charolette Forward, Kim Primary Care Beula Joyner: [name] Other Clinician: [name] Referring Dino Borntreger: Treating Emilian Stawicki/Extender: [name] in Treatment: 21 Wound Status Wound Number: 3 Primary T be determined o Etiology: Wound Location: Dorsal Foot Wound Status: Open Wounding Event: Blister Comorbid Anemia, Asthma, History of pressure wounds, Osteomyelitis, Date Acquired: 01/13/2022 History: Neuropathy Weeks Of  Treatment: 0 Clustered Wound: No Photos Wound Measurements Length: (cm) 23.3 Width: (cm) 2.5 Depth: (cm) 0.2 Area: (cm) 45.749 Volume: (cm) 9.15 %  Reduction in Area: % Reduction in Volume: Epithelialization: None Tunneling: No Undermining: No Wound Description Classification: Full Thickness Without Exposed Support Structures Wound Margin: Flat and Intact Exudate Amount: Medium Exudate Type: Serosanguineous Exudate Color: red, brown Foul Odor After Cleansing: No Slough/Fibrino Yes Wound Bed Exposed Structure Fascia Exposed: No Fat Layer (Subcutaneous Tissue) Exposed: Yes Tendon Exposed: No Muscle Exposed: No Joint Exposed: No Bone Exposed: No Treatment Notes Wound #3 (Foot) Wound Laterality: Dorsal Cleanser Byram Ancillary Kit - 15 Day Supply Discharge Instruction: Use supplies as instructed; Kit contains: (15) Saline Bullets; (15) 3x3 Gauze; 15 pr Gloves Kimball, Rick M ([id number]) [id number] Page 10 of 10 Soap and Water Discharge Instruction: Gently cleanse wound with antibacterial soap, rinse and pat dry prior to dressing wounds Peri-Wound Care Topical Primary Dressing Prisma 4.34 (in) Discharge Instruction: Moisten w/normal saline or sterile water; Cover wound as directed. Do not remove from wound bed. Secondary Dressing (BORDER) Zetuvit Plus SILICONE BORDER Dressing 4x4 (in/in) Discharge Instruction: Please do not put silicone bordered dressings under wraps. Use non-bordered dressing only. Secured With Compression Wrap Compression Stockings Environmental education officer) Signed: 01/20/2022 2:03:30 PM By: [name] Signed: 01/20/2022 4:52:31 PM By: Gretta Cool, BSN, RN, CWS, Kim RN, BSN Entered By: [name] on 01/19/2022 09:45:26 -------------------------------------------------------------------------------- Vitals Details Patient Name: Date of Service: [name], MA TTIE M. 01/19/2022 9:15 A M Medical Record Number:  [id number] Patient Account Number: [id number] Date of Birth/Sex: Treating RN: [birth year] (47 y.o. Charolette Forward, Kim Primary Care Kallin Henk: [name] Other Clinician: [name] Referring Zeena Starkel: Treating Emmalia Heyboer/Extender: [name] in Treatment: 21 Vital Signs Time Taken: 09:28 Temperature (F): 99.4 Height (in): 67 Pulse (bpm): 86 Weight (lbs): 153 Respiratory Rate (breaths/min): 16 Body Mass Index (BMI): 24 Blood Pressure (mmHg): 114/72 Reference Range: 80 - 120 mg / dl Electronic Signature(s) Signed: 01/20/2022 2:03:30 PM By: [name] Entered By: [name] on 01/19/2022 09:30:31

## 2022-01-26 ENCOUNTER — Encounter: Payer: Medicare HMO | Admitting: Physician Assistant

## 2022-01-26 DIAGNOSIS — M86671 Other chronic osteomyelitis, right ankle and foot: Secondary | ICD-10-CM | POA: Diagnosis not present

## 2022-01-26 DIAGNOSIS — G7100 Muscular dystrophy, unspecified: Secondary | ICD-10-CM | POA: Diagnosis not present

## 2022-01-26 DIAGNOSIS — Z89412 Acquired absence of left great toe: Secondary | ICD-10-CM | POA: Diagnosis not present

## 2022-01-26 DIAGNOSIS — L84 Corns and callosities: Secondary | ICD-10-CM | POA: Diagnosis not present

## 2022-01-26 DIAGNOSIS — L97512 Non-pressure chronic ulcer of other part of right foot with fat layer exposed: Secondary | ICD-10-CM | POA: Diagnosis not present

## 2022-01-26 DIAGNOSIS — F17210 Nicotine dependence, cigarettes, uncomplicated: Secondary | ICD-10-CM | POA: Diagnosis not present

## 2022-01-26 DIAGNOSIS — Z89411 Acquired absence of right great toe: Secondary | ICD-10-CM | POA: Diagnosis not present

## 2022-01-26 DIAGNOSIS — G600 Hereditary motor and sensory neuropathy: Secondary | ICD-10-CM | POA: Diagnosis not present

## 2022-01-26 DIAGNOSIS — L02611 Cutaneous abscess of right foot: Secondary | ICD-10-CM | POA: Diagnosis not present

## 2022-01-26 NOTE — Progress Notes (Signed)
DENIECE, RANKIN ([id number]) [id number] Page 1 of 4 Visit Report for 01/26/2022 Chief Complaint Document Details Patient Name: Date of Service: [name]. 01/26/2022 8:45 A M Medical Record Number: [id number] Patient Account Number: [id number] Date of Birth/Sex: Treating RN: 08/29/1975 (47 y.o. Orvan Falconer Primary Care Provider: [name] Other Clinician: Referring Provider: Treating Provider/Extender: [name] in Treatment: 22 Information Obtained from: Patient Chief Complaint Right foot ulcer Electronic Signature(s) Signed: 01/26/2022 9:16:46 AM By: [name] Entered By: Worthy Keeler on 01/26/2022 09:16:45 -------------------------------------------------------------------------------- Debridement Details Patient Name: Date of Service: [name], [name]. 01/26/2022 8:45 A M Medical Record Number: [id number] Patient Account Number: [id number] Date of Birth/Sex: Treating RN: 02-Dec-1975 (46 y.o. Orvan Falconer Primary Care Provider: [name] Other Clinician: Referring Provider: Treating Provider/Extender: [name] in Treatment: 22 Debridement Performed for Assessment: Wound #2 Plantar Foot Performed By: Physician [name], [name] Debridement Type: Debridement Level of Consciousness (Pre-procedure): Awake and Alert Pre-procedure Verification/Time Out Yes - 09:39 Taken: Start Time: 09:39 Pain Control: Lidocaine 4% T opical Solution T Area Debrided (L x W): otal 1 (cm) x 3 (cm) = 3 (cm) Tissue and other material debrided: Callus, Slough, Subcutaneous, Slough Level: Skin/Subcutaneous Tissue Debridement Description: Excisional Instrument: Curette Bleeding: Moderate Hemostasis Achieved: Pressure End Time: 09:43 Procedural Pain: 0 Post Procedural Pain: 0 Response to Treatment: Procedure was tolerated well [name], Kelly M ([id number])  [id number] Page 2 of 4 Level of Consciousness (Post- Awake and Alert procedure): Post Debridement Measurements of Total Wound Length: (cm) 1 Width: (cm) 3 Depth: (cm) 0.2 Volume: (cm) 0.471 Character of Wound/Ulcer Post Debridement: Improved Post Procedure Diagnosis Same as Pre-procedure Electronic Signature(s) Unsigned Entered ByCarlene Coria on 01/26/2022 09:44:09 -------------------------------------------------------------------------------- Physician Orders Details Patient Name: Date of Service: [name]. 01/26/2022 8:45 A M Medical Record Number: [id number] Patient Account Number: [id number] Date of Birth/Sex: Treating RN: February 20, 1975 (47 y.o. 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 Bathing/ Shower/ Hygiene May shower; gently cleanse wound with antibacterial soap, rinse and pat dry prior to dressing wounds No tub bath. Anesthetic (Use 'Patient Medications' Section for Anesthetic Order Entry) Lidocaine applied to wound bed Edema Control - Lymphedema / Segmental Compressive Device / Other Elevate, Exercise Daily and A void Standing for Long Periods of Time. Elevate legs to the level of the heart and pump ankles as often as possible Elevate leg(s) parallel to the floor when sitting. Medications-Please add to medication list. Wound #2 Plantar Foot Take one '500mg'$  Tylenol (A cetaminophen) and one '200mg'$  Motrin (Ibuprofen) every  6 hours for pain. Do not take ibuprofen if you are on blood thinners or have stomach ulcers. Kelly, [name] ([id number]) [id number] Page 3 of 4 Wound Treatment Wound #2 - Foot Wound Laterality: Plantar Cleanser: Byram Ancillary Kit - 15 Day Supply (Generic) 3 x Per Week/30 Days Discharge Instructions: Use supplies as instructed; Kit contains: (15) Saline Bullets; (15) 3x3 Gauze; 15 pr Gloves Cleanser: Soap and Water 3 x Per Week/30 Days Discharge Instructions: Gently cleanse wound  with antibacterial soap, rinse and pat dry prior to dressing wounds Prim Dressing: Hydrofera Blue Ready Transfer Foam, 2.5x2.5 (in/in) 3 x Per Week/30 Days ary Discharge Instructions: Apply Hydrofera Blue Ready to wound bed as directed Secondary Dressing: ABD Pad 5x9 (in/in) 3 x Per Week/30 Days Discharge Instructions: over zetuvit for cushion/protection Secondary Dressing: (BORDER) Zetuvit Plus SILICONE BORDER Dressing 4x4 (in/in) (Dispense As Written) 3 x Per Week/30 Days Discharge Instructions: Please do not put silicone bordered dressings under wraps. Use non-bordered dressing only. Secured With: ACE WRAP - 40M ACE Elastic Bandage With VELCRO Brand Closure, 4 (in) 3 x Per Week/30 Days Wound #3 - Foot Wound Laterality: Dorsal Cleanser: Byram Ancillary Kit - 15 Day Supply (Generic) 3 x Per Week/30 Days Discharge Instructions: Use supplies as instructed; Kit contains: (15) Saline Bullets; (15) 3x3 Gauze; 15 pr Gloves Cleanser: Soap and Water 3 x Per Week/30 Days Discharge Instructions: Gently cleanse wound with antibacterial soap, rinse and pat dry prior to dressing wounds Prim Dressing: Hydrofera Blue Ready Transfer Foam, 2.5x2.5 (in/in) 3 x Per Week/30 Days ary Discharge Instructions: Apply Hydrofera Blue Ready to wound bed as directed Secondary Dressing: ABD Pad 5x9 (in/in) 3 x Per Week/30 Days Discharge Instructions: over zetuvit for cushion/protection Secondary Dressing:  (BORDER) Zetuvit Plus SILICONE BORDER Dressing 4x4 (in/in) (Dispense As Written) 3 x Per Week/30 Days Discharge Instructions: Please do not put silicone bordered dressings under wraps. Use non-bordered dressing only. Secured With: ACE WRAP - 40M ACE Elastic Bandage With VELCRO Brand Closure, 4 (in) 3 x Per Week/30 Days Wound #4 - Foot Wound Laterality: Dorsal, Medial Cleanser: Byram Ancillary Kit - 15 Day Supply (Generic) 3 x Per Week/30 Days Discharge Instructions: Use supplies as instructed; Kit contains: (15) Saline Bullets; (15) 3x3 Gauze; 15 pr Gloves Cleanser: Soap and Water 3 x Per Week/30 Days Discharge Instructions: Gently cleanse wound with antibacterial soap, rinse and pat dry prior to dressing wounds Prim Dressing: Hydrofera Blue Ready Transfer Foam, 2.5x2.5 (in/in) 3 x Per Week/30 Days ary Discharge Instructions: Apply Hydrofera Blue Ready to wound bed as directed Secondary Dressing: ABD Pad 5x9 (in/in) 3 x Per Week/30 Days Discharge Instructions: over zetuvit for cushion/protection Secondary Dressing: (BORDER) Zetuvit Plus SILICONE BORDER Dressing 4x4 (in/in) (Dispense As Written) 3 x Per Week/30 Days Discharge Instructions: Please do not put silicone bordered dressings under wraps. Use non-bordered dressing only. Secured With: ACE WRAP - 40M ACE Elastic Bandage With VELCRO Brand Closure, 4 (in) 3 x Per Week/30 Days Electronic Signature(s) Unsigned Entered ByCarlene Coria on 01/26/2022 10:04:19 Signature(s): [name] ([id number]) [id number] Date(s): [id number] Page 4 of 4 -------------------------------------------------------------------------------- Problem List Details Patient Name: Date of Service: [name]. 01/26/2022 8:45 A M Medical Record Number: [id number] Patient Account Number: [id number] Date of Birth/Sex: Treating RN: 29-May-1975 (47 y.o. 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

## 2022-01-28 NOTE — Progress Notes (Signed)
DELISE, SIMENSON ([id number]) [id number] Page 1 of Kelly Visit Report for 01/26/2022 Arrival Information Details Patient Name: Date of Service: Kelly Kelly, [name] 01/26/2022 8:45 A Kelly Medical Record Number: [id number] Patient Account Number: [id number] Date of Birth/Sex: Treating RN: Aug 24, 1975 (47 y.o. Kelly Kelly Primary Care Kelly Kelly: Kelly Kelly Other Clinician: Referring Kelly Kelly: Treating Kelly Kelly/Extender: Kelly Kelly in Treatment: 43 Visit Information History Since Last Visit All ordered tests and consults were completed: No Patient Arrived: Ambulatory Added or deleted any medications: No Arrival Time: 09:13 Any new allergies or adverse reactions: No Accompanied By: self Had a fall or experienced change in No Transfer Assistance: None activities of daily living that may affect Patient Identification Verified: Yes risk of falls: Secondary Verification Process Completed: Yes Signs or symptoms of abuse/neglect since last visito No Patient Requires Transmission-Based Precautions: No Hospitalized since last visit: No Patient Has Alerts: Yes Implantable device outside of the clinic excluding No Patient Alerts: ABI R 1.15 02/04/21 cellular tissue based products placed in the center ABI L 1.11 02/04/21 since last visit: Has Dressing in Place as Prescribed: Yes Pain Present Now: No Electronic Signature(s) Signed: 01/28/2022 3:13:07 PM By: Kelly Coria RN Entered By: Kelly Kelly on 01/26/2022 09:14:19 -------------------------------------------------------------------------------- Clinic Level of Care Assessment Details Patient Name: Date of Service: Kelly Penna. 01/26/2022 8:45 A Kelly Medical Record Number: [id number] Patient Account Number: [id number] Date of Birth/Sex: Treating RN: 12/03/1975 (47 y.o. Kelly Kelly Primary Care Kelly Kelly: Kelly Kelly Other Clinician: Referring Kelly Kelly: Treating Kelly Kelly/Extender:  Kelly Kelly in Treatment: 22 Clinic Level of Care Assessment Items TOOL 1 Quantity Score '[]'$  - 0 Use when EandM and Procedure is performed on INITIAL visit ASSESSMENTS - Nursing Assessment / Reassessment '[]'$  - 0 General Physical Exam (combine w/ comprehensive assessment (listed just below) when performed on new pt. evals) '[]'$  - 0 Comprehensive Assessment (HX, ROS, Risk Assessments, Wounds Hx, etc.) [name], Kelly Kelly ASSESSMENTS - Wound and Skin Assessment / Reassessment '[]'$  - 0 Dermatologic / Skin Assessment (not related to wound area) ASSESSMENTS - Ostomy and/or Continence Assessment and Care '[]'$  - 0 Incontinence Assessment and Management '[]'$  - 0 Ostomy Care Assessment and Management (repouching, etc.) PROCESS - Coordination of Care '[]'$  - 0 Simple Patient / Family Education for ongoing care '[]'$  - 0 Complex (extensive) Patient / Family Education for ongoing care '[]'$  - 0 Staff obtains Programmer, systems, Records, T Results / Process Orders est '[]'$  - 0 Staff telephones HHA, Nursing Homes / Clarify orders / etc '[]'$  - 0 Routine Transfer to another Facility (non-emergent condition) '[]'$  - 0 Routine Hospital Admission (non-emergent condition) '[]'$  - 0 New Admissions / Biomedical engineer / Ordering NPWT Apligraf, etc. , '[]'$  - 0 Emergency Hospital Admission (emergent condition) PROCESS - Special Needs '[]'$  - 0 Pediatric / Minor Patient Management '[]'$  - 0 Isolation Patient Management '[]'$  - 0 Hearing / Language / Visual special needs '[]'$  - 0 Assessment of Community assistance (transportation, D/C planning, etc.) '[]'$  - 0 Additional assistance / Altered mentation '[]'$  - 0 Support Surface(s) Assessment (bed, cushion, seat, etc.) INTERVENTIONS - Miscellaneous '[]'$  - 0 External ear exam '[]'$  - 0 Patient Transfer (multiple staff / Civil Service fast streamer / Similar devices) '[]'$  - 0 Simple Staple / Suture removal (25 or less) '[]'$  -  0 Complex Staple / Suture removal (26 or more) '[]'$  - 0 Hypo/Hyperglycemic Management (do not check if billed separately) '[]'$  - 0 Ankle /  Brachial Index (ABI) - do not check if billed separately Has the patient been seen at the hospital within the last three years: Yes Total Score: 0 Level Of Care: ____ Electronic Signature(s) Signed: 01/28/2022 3:13:07 PM By: Kelly Coria RN Entered By: Kelly Kelly on 01/26/2022 09:46:16 -------------------------------------------------------------------------------- Encounter Discharge Information Details Patient Name: Date of Service: Kelly Kelly, Kelly Grills Kelly. 01/26/2022 8:45 A Kelly Medical Record Number: [id number] Patient Account Number: [id number] Date of Birth/Sex: Treating RN: 02/16/1975 (47 y.o. Kelly Kelly Primary Care Kelly Kelly: Kelly Kelly Other Clinician: Referring Kelly Kelly: Treating Kelly Kelly/Extender: Kelly Kelly [place], [name] Kelly Kelly ([id number]) [id number] Page 3 of Kelly Kelly in Treatment: 22 Encounter Discharge Information Items Post Procedure Vitals Discharge Condition: Stable Temperature (F): 98.5 Ambulatory Status: Ambulatory Pulse (bpm): 84 Discharge Destination: Home Respiratory Rate (breaths/min): 18 Transportation: Private Auto Blood Pressure (mmHg): 110/73 Accompanied By: self Schedule Follow-up Appointment: Yes Clinical Summary of Care: Electronic Signature(s) Signed: 01/28/2022 3:13:07 PM By: Kelly Coria RN Entered By: Kelly Kelly on 01/26/2022 09:48:24 -------------------------------------------------------------------------------- Lower Extremity Assessment Details Patient Name: Date of Service: Kelly Kelly, Kelly Mons. 01/26/2022 8:45 A Kelly Medical Record Number: [id number] Patient Account Number: [id number] Date of Birth/Sex: Treating RN: [birth year] (47 y.o. Kelly Kelly Primary Care Farhaan Mabee: Kelly Kelly Other Clinician: Referring Kelly Kelly: Treating Kelly Kelly/Extender:  Kelly Kelly in Treatment: 22 Vascular Assessment Pulses: Dorsalis Pedis Palpable: [Left:Yes] Electronic Signature(s) Signed: 01/28/2022 3:13:07 PM By: Kelly Coria RN Entered By: Kelly Kelly on 01/26/2022 09:26:Kelly -------------------------------------------------------------------------------- Multi Wound Chart Details Patient Name: Date of Service: Kelly Kelly, Kelly Mons. 01/26/2022 8:45 A Kelly Medical Record Number: [id number] Patient Account Number: [id number] Date of Birth/Sex: Treating RN: [birth year] (47 y.o. Kelly Kelly Primary Care Aydan Phoenix: Kelly Kelly Other Clinician: Referring Kynzli Rease: Treating Sparkle Aube/Extender: Kelly Kelly in Treatment: 22 Vital Signs Height(in): 67 Pulse(bpm): 84 Steil, Kelly Kelly ([id number]) [id number] Page 4 of Kelly Weight(lbs): 153 Blood Pressure(mmHg): 110/73 Body Mass Index(BMI): 24 Temperature(F): 98.5 Respiratory Rate(breaths/min): 18 [2:Photos:] Plantar Foot Dorsal Foot Medial, Dorsal Foot Wound Location: Trauma Blister Blister Wounding Event: Abscess T be determined o Trauma, Other Primary Etiology: Anemia, Asthma, History of pressure Anemia, Asthma, History of pressure Anemia, Asthma, History of pressure Comorbid History: wounds, Osteomyelitis, Neuropathy wounds, Osteomyelitis, Neuropathy wounds, Osteomyelitis, Neuropathy 06/18/2021 01/13/2022 01/23/2022 Date Acquired: 22 1 0 [name] of Treatment: Open Open Open Wound Status: No No No Wound Recurrence: 1x3x0.2 2x2x0.2 0.3x0.3x0.2 Measurements L x W x D (cm) 2.356 3.142 0.071 A (cm) : rea 0.471 0.628 0.014 Volume (cm) : -233.20% 93.Kelly% N/A % Reduction in Area: -66.40% 93.Kelly% N/A % Reduction in Volume: Full Thickness Without Exposed Full Thickness Without Exposed Full Thickness Without Exposed Classification: Support Structures Support Structures Support Structures Medium Medium Medium Exudate  Amount: Serosanguineous Serosanguineous Serosanguineous Exudate Type: red, brown red, brown red, brown Exudate Color: N/A Flat and Intact N/A Wound Margin: Medium (34-66%) Large (67-100%) Large (67-100%) Granulation Amount: Pink Red Pink Granulation Quality: Medium (34-66%) Small (1-33%) None Present (0%) Necrotic Amount: Fat Layer (Subcutaneous Tissue): Yes Fat Layer (Subcutaneous Tissue): Yes Fat Layer (Subcutaneous Tissue): Yes Exposed Structures: Fascia: No Fascia: No Fascia: No Tendon: No Tendon: No Tendon: No Muscle: No Muscle: No Muscle: No Joint: No Joint: No Joint: No Bone: No Bone: No Bone: No Medium (34-66%) None None Epithelialization: Treatment Notes Electronic Signature(s) Signed: 01/28/2022 3:13:07 PM By: Kelly Coria RN Entered By: Kelly Kelly on 01/26/2022 09:37:08 -------------------------------------------------------------------------------- Multi-Disciplinary Care Plan Details Patient Name: Date of Service: Kelly Capes  Kelly, [place] Kelly Kelly. 01/26/2022 8:45 A Kelly Medical Record Number: [id number] Patient Account Number: [id number] Date of Birth/Sex: Treating RN: [birth year] (47 y.o. Kelly Kelly Primary Care Kerly Rigsbee: Kelly Kelly Other Clinician: Referring Mihaela Fajardo: Treating Mary Secord/Extender: Kelly Kelly in Treatment: [address] Kelly Kelly ([id number]) [id number] Page 5 of Kelly Active Inactive Wound/Skin Impairment Nursing Diagnoses: Knowledge deficit related to ulceration/compromised skin integrity Goals: Patient/caregiver will verbalize understanding of skin care regimen Date Initiated: 08/20/2021 Target Resolution Date: 02/20/2022 Goal Status: Active Ulcer/skin breakdown will have a volume reduction of 30% by week 4 Date Initiated: 08/20/2021 Date Inactivated: 01/26/2022 Target Resolution Date: 09/20/2021 Goal Status: Unmet Unmet Reason: COMORBIDITIES Ulcer/skin breakdown will have a volume reduction of  50% by week 8 Date Initiated: 08/20/2021 Date Inactivated: 01/26/2022 Target Resolution Date: Kelly/03/2021 Goal Status: Unmet Unmet Reason: COMORBIDITIES Ulcer/skin breakdown will have a volume reduction of 80% by week 12 Date Initiated: 08/20/2021 Date Inactivated: 01/26/2022 Target Resolution Date: 11/20/2021 Goal Status: Unmet Unmet Reason: COMORBIDITIES Ulcer/skin breakdown will heal within 14 [name] Date Initiated: 08/20/2021 Date Inactivated: 01/26/2022 Target Resolution Date: 12/20/2021 Goal Status: Unmet Unmet Reason: COMORBIDITIES Interventions: Assess patient/caregiver ability to obtain necessary supplies Assess patient/caregiver ability to perform ulcer/skin care regimen upon admission and as needed Assess ulceration(s) every visit Notes: Electronic Signature(s) Signed: 01/28/2022 3:13:07 PM By: Kelly Coria RN Entered By: Kelly Kelly on 01/26/2022 09:47:39 -------------------------------------------------------------------------------- Pain Assessment Details Patient Name: Date of Service: Kelly Kelly, Kelly Mons. 01/26/2022 8:45 A Kelly Medical Record Number: [id number] Patient Account Number: [id number] Date of Birth/Sex: Treating RN: December 08, 1975 (47 y.o. Kelly Kelly Primary Care Raha Tennison: Kelly Kelly Other Clinician: Referring Shanece Cochrane: Treating Pamlea Finder/Extender: Kelly Kelly in Treatment: 22 Active Problems Location of Pain Severity and Description of Pain Patient Has Paino No Site Locations [place], [name] Kelly ([id number]) [id number] Page 6 of Kelly Pain Management and Medication Current Pain Management: Electronic Signature(s) Signed: 01/28/2022 3:13:07 PM By: Kelly Coria RN Entered By: Kelly Kelly on 01/26/2022 09:14:44 -------------------------------------------------------------------------------- Patient/Caregiver Education Details Patient Name: Date of Service: Kelly Penna 1/9/2024andnbsp8:45 A Kelly Medical Record  Number: [id number] Patient Account Number: [id number] Date of Birth/Gender: Treating RN: 1975/05/18 (47 y.o. Kelly Kelly Primary Care Physician: Kelly Kelly Other Clinician: Referring Physician: Treating Physician/Extender: Kelly Kelly in Treatment: 22 Education Assessment Education Provided To: Patient Education Topics Provided Wound/Skin Impairment: Methods: Explain/Verbal Responses: State content correctly [employer]) Signed: 01/28/2022 3:13:07 PM By: Kelly Coria RN Entered By: Kelly Kelly on 01/26/2022 09:46:30 Azizi, Kelly Kelly ([id number]) [id number] Page 7 of Kelly -------------------------------------------------------------------------------- Wound Assessment Details Patient Name: Date of Service: Kelly Penna. 01/26/2022 8:45 A Kelly Medical Record Number: [id number] Patient Account Number: [id number] Date of Birth/Sex: Treating RN: 05-25-1975 (47 y.o. Kelly Kelly Primary Care Cyrstal Leitz: Kelly Kelly Other Clinician: Referring Martina Brodbeck: Treating Lekendrick Alpern/Extender: Kelly Kelly in Treatment: 22 Wound Status Wound Number: 2 Primary Abscess Etiology: Wound Location: Plantar Foot Wound Status: Open Wounding Event: Trauma Comorbid Anemia, Asthma, History of pressure wounds, Osteomyelitis, Date Acquired: 06/18/2021 History: Neuropathy [name] Of Treatment: 22 Clustered Wound: No Photos Wound Measurements Length: (cm) 1 Width: (cm) 3 Depth: (cm) 0.2 Area: (cm) 2.356 Volume: (cm) 0.471 % Reduction in Area: -233.2% % Reduction in Volume: -66.4% Epithelialization: Medium (34-66%) Tunneling: No Undermining: No Wound Description Classification: Full Thickness Without Exposed Support Structures Exudate Amount: Medium Exudate Type: Serosanguineous Exudate Color: red, brown Foul Odor After Cleansing: No Slough/Fibrino No Wound Bed Granulation  Amount: Medium (34-66%)  Exposed Structure Granulation Quality: Pink Fascia Exposed: No Necrotic Amount: Medium (34-66%) Fat Layer (Subcutaneous Tissue) Exposed: Yes Tendon Exposed: No Muscle Exposed: No Joint Exposed: No Bone Exposed: No Treatment Notes Wound #2 (Foot) Wound Laterality: Plantar Cleanser Byram Ancillary Kit - 15 Day Supply Discharge Instruction: Use supplies as instructed; Kit contains: (15) Saline Bullets; (15) 3x3 Gauze; 15 pr Gloves Soap and Water Pickler, Kelly Kelly ([id number]) [id number] Page 8 of Kelly Discharge Instruction: Gently cleanse wound with antibacterial soap, rinse and pat dry prior to dressing wounds Peri-Wound Care Topical Primary Dressing Hydrofera Blue Ready Transfer Foam, 2.5x2.5 (in/in) Discharge Instruction: Apply Hydrofera Blue Ready to wound bed as directed Secondary Dressing (BORDER) Zetuvit Plus SILICONE BORDER Dressing 4x4 (in/in) Discharge Instruction: Please do not put silicone bordered dressings under wraps. Use non-bordered dressing only. Secured With ACE WRAP - 27M ACE Elastic Bandage With VELCRO Brand Closure, 4 (in) Compression Wrap Compression Stockings Add-Ons Electronic Signature(s) Signed: 01/28/2022 3:13:07 PM By: Kelly Coria RN Entered By: Kelly Kelly on 01/26/2022 09:23:41 -------------------------------------------------------------------------------- Wound Assessment Details Patient Name: Date of Service: Kelly Kelly, Kelly Mons. 01/26/2022 8:45 A Kelly Medical Record Number: [id number] Patient Account Number: [id number] Date of Birth/Sex: Treating RN: [birth year] (47 y.o. Kelly Kelly Primary Care Corliss Coggeshall: Kelly Kelly Other Clinician: Referring Genna Casimir: Treating Shayle Donahoo/Extender: Kelly Kelly in Treatment: 22 Wound Status Wound Number: 3 Primary T be determined o Etiology: Wound Location: Dorsal Foot Wound Status: Open Wounding Event: Blister Comorbid Anemia, Asthma, History of pressure  wounds, Osteomyelitis, Date Acquired: 01/13/2022 History: Neuropathy [name] Of Treatment: 1 Clustered Wound: No Photos Wound Measurements Length: (cm) 2 Width: (cm) 2 Depth: (cm) 0.2 Holroyd, Kelly Kelly Kelly ([id number]) Area: (cm) 3.142 Volume: (cm) 0.628 % Reduction in Area: 93.1% % Reduction in Volume: 93.1% Epithelialization: None [id number] Page 9 of Kelly Tunneling: No Undermining: No Wound Description Classification: Full Thickness Without Exposed Suppor Wound Margin: Flat and Intact Exudate Amount: Medium Exudate Type: Serosanguineous Exudate Color: red, brown t Structures Foul Odor After Cleansing: No Slough/Fibrino Yes Wound Bed Granulation Amount: Large (67-100%) Exposed Structure Granulation Quality: Red Fascia Exposed: No Necrotic Amount: Small (1-33%) Fat Layer (Subcutaneous Tissue) Exposed: Yes Necrotic Quality: Adherent Slough Tendon Exposed: No Muscle Exposed: No Joint Exposed: No Bone Exposed: No Electronic Signature(s) Signed: 01/28/2022 3:13:07 PM By: Kelly Coria RN Entered By: Kelly Kelly on 01/26/2022 09:24:05 -------------------------------------------------------------------------------- Wound Assessment Details Patient Name: Date of Service: Kelly Kelly, Kelly Mons. 01/26/2022 8:45 A Kelly Medical Record Number: [id number] Patient Account Number: [id number] Date of Birth/Sex: Treating RN: February Kelly, 1977 (47 y.o. Kelly Kelly Primary Care Valincia Touch: Kelly Kelly Other Clinician: Referring Mike Hamre: Treating Hridhaan Yohn/Extender: Kelly Kelly in Treatment: 22 Wound Status Wound Number: 4 Primary Trauma, Other Etiology: Wound Location: Medial, Dorsal Foot Wound Status: Open Wounding Event: Blister Comorbid Anemia, Asthma, History of pressure wounds, Osteomyelitis, Date Acquired: 01/23/2022 History: Neuropathy [name] Of Treatment: 0 Clustered Wound: No Photos Wound Measurements Length: (cm) 0.3 Width: (cm)  0.3 Depth: (cm) 0.2 Area: (cm) 0.071 Dorko, Shivaun Kelly ([id number]) Volume: (cm) 0.014 % Reduction in Area: % Reduction in Volume: Epithelialization: None Tunneling: No [id number] Page Kelly of Kelly Undermining: No Wound Description Classification: Full Thickness Without Exposed Suppor Exudate Amount: Medium Exudate Type: Serosanguineous Exudate Color: red, brown t Structures Foul Odor After Cleansing: No Slough/Fibrino No Wound Bed Granulation Amount: Large (67-100%) Exposed Structure Granulation Quality: Pink Fascia Exposed: No Necrotic Amount: None Present (0%) Fat Layer (Subcutaneous Tissue) Exposed: Yes  Tendon Exposed: No Muscle Exposed: No Joint Exposed: No Bone Exposed: No Electronic Signature(s) Signed: 01/28/2022 3:13:07 PM By: Kelly Coria RN Entered By: Kelly Kelly on 01/26/2022 09:25:54 -------------------------------------------------------------------------------- Vitals Details Patient Name: Date of Service: Kelly Kelly, [place] Kelly. 01/26/2022 8:45 A Kelly Medical Record Number: [id number] Patient Account Number: [id number] Date of Birth/Sex: Treating RN: [birth year] (47 y.o. Kelly Kelly Primary Care Orit Sanville: Kelly Kelly Other Clinician: Referring Elani Delph: Treating Amberley Hamler/Extender: Kelly Kelly in Treatment: 22 Vital Signs Time Taken: 09:14 Temperature (F): 98.5 Height (in): 67 Pulse (bpm): 84 Weight (lbs): 153 Respiratory Rate (breaths/min): 18 Body Mass Index (BMI): 24 Blood Pressure (mmHg): 110/73 Reference Range: 80 - 120 mg / dl Electronic Signature(s) Signed: 01/28/2022 3:13:07 PM By: Kelly Coria RN Entered By: Kelly Kelly on 01/26/2022 09:14:37

## 2022-02-01 ENCOUNTER — Ambulatory Visit: Payer: Medicare HMO | Admitting: Psychology

## 2022-02-01 DIAGNOSIS — F331 Major depressive disorder, recurrent, moderate: Secondary | ICD-10-CM | POA: Diagnosis not present

## 2022-02-01 NOTE — Progress Notes (Signed)
[name] is a 47 y.o. female patient .  Diagnosis 296.30 (Major depressive affective disorder, recurrent episode, unspecified) [n/a]  Symptoms Depressed or irritable mood. (Status: maintained) -- No Description Entered  Diminished interest in or enjoyment of activities. (Status: maintained) -- No Description Entered  Feelings of hopelessness, worthlessness, or inappropriate guilt. (Status: maintained) -- No Description Entered  Lack of energy. (Status: maintained) -- No Description Entered  Unresolved grief issues. (Status: maintained) -- No Description Entered  Medication Status compliance  Safety none  If Suicidal or Homicidal State Action Taken: unspecified  Current Risk: low Medications Hydroxyzine (Dosage: unknown)  Remiron (Dosage: unknown)  Sertraline (Dosage: unknown)  Objectives Related Problem: Appropriately grieve the loss in order to normalize mood and to return to previously adaptive level of functioning. Description: Increasingly verbalize hopeful and positive statements regarding self, others, and the future. Target Date: 2022-07-15 Frequency: Daily Modality: individual Progress: 90%  Related Problem: Appropriately grieve the loss in order to normalize mood and to return to previously adaptive level of functioning. Description: Learn and implement conflict resolution skills to resolve interpersonal problems. Target Date: 2022-07-15 Frequency: Daily Modality: individual Progress: 85%  Related Problem: Appropriately grieve the loss in order to normalize mood and to return to previously adaptive level of functioning. Description: Learn and implement problem-solving and decision-making skills. Target Date: 2022-07-15 Frequency: Daily Modality: individual Progress: 85%  Related Problem: Appropriately grieve the loss in order to normalize mood and to return to  previously adaptive level of functioning. Description: Describe current and past experiences with depression including their impact on functioning and attempts to resolve it. Target Date: 2021-07-14 Frequency: Daily Modality: individual Progress: 100%-completed  Related Problem: Complete the process of letting go of the lost significant other. Description: Report decreased time spent each day focusing on the loss. Target Date: 2022-07-15 Frequency: Daily Modality: individual Progress: 85%  Related Problem: Complete the process of letting go of the lost significant other. Description: Decrease unrealistic thoughts, statements, and feelings of being responsible for the loss. Target Date: 2021-07-14 Frequency: Daily Modality: individual Progress: 100%-completed  Related Problem: Complete the process of letting go of the lost significant other. Description: Verbalize and resolve feelings of anger or guilt focused on self or deceased loved one that interfere with the grieving process. Target Date: 2021-07-14 Frequency: Daily Modality: individual Progress: 100%-completed  Related Problem: Complete the process of letting go of the lost significant other. Description: Begin verbalizing feelings associated with the loss. Target Date: 2022-07-15 Frequency: Daily Modality: individual Progress: 80%  Related Problem: Complete the process of letting go of the lost significant other. Description: Identify what stages of grief have been experienced in the continuum of the grieving process. Target Date: 2021-07-14 Frequency: Daily Modality: individual Progress: 100%-completed  Related Problem: Complete the process of letting go of the lost significant other. Description: Participate in a therapy that addresses issues beyond grief that have arisen as a result of the loss. Target Date: 2022-07-15 Frequency: Daily Modality: individual Progress: 85%  Related Problem: Complete the process of  letting go of the lost significant other. Description: Tell in detail the story of the current loss that is triggering symptoms. Target Date: 2021-07-14 Frequency: Daily Modality: individual Progress: 100%-completed  Client Response full  compliance  Service Location Location, [address] [place], [place] [address]  Service Code cpt [phone]  Validate/empathize  Related past to present  Behavioral activation plan  Facilitate problem solving  Identify automatic thoughts  Rationally challenge thoughts or beliefs/cognitive restructuring  Identify/label emotions  Emotion regulation skills  Psychiatrist  Self care activities  Self-monitoring  Session Notes: Patient requests a video Webex session. She is at home and I am at my home office.  Dx: Major Depression  Meds: Lexapro ('20mg'$ ), Busperone (7.5)  Goals: States that she is seeking counseling to learn to manage the grief reaction to losing her husband. She reports feeling tired all the time and that she has no energy or motivation. Needs to develop coping strategies. Therapy to focus on reducing despair, sadness, hopelessness and helplessness. 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 Will explore those early traumas/losses and current triggers. Goal dates: 04-2021 Revised Goals:6-24 Patient states that during the holiday, her brother went AWOL during part of the time and her parents were worried. They called the for a safety check with police. Her sister in law was angry. [name] had to leave when her sister came to her parent's house. [name] was sick entire month of December and got behind at school. She will be back in touch with school to get back on track. She particularly loves writing and had been journaling, which she finds helpful. Encouraged her to reach out to her school about getting caught up and also with career counselor to discuss interests and opportunities. She  remains motivated and reports that her moods have been stable. Continues to get distressed about family dynamics, particularly with regard to her brother and mother's relationship. We discussed ways tp address this with her mother without getting sidetracked as she typically does. Will consider this, but not sure she feels ready to address it with her. Medication compliance is good.                                            [name], PhD Time: 0:10U-7:25D 45 minutes

## 2022-02-02 ENCOUNTER — Ambulatory Visit: Payer: Medicare PPO | Admitting: Physician Assistant

## 2022-02-09 ENCOUNTER — Ambulatory Visit: Payer: Self-pay | Admitting: Physician Assistant

## 2022-02-15 ENCOUNTER — Ambulatory Visit (INDEPENDENT_AMBULATORY_CARE_PROVIDER_SITE_OTHER): Payer: Medicare HMO | Admitting: Psychology

## 2022-02-15 DIAGNOSIS — F331 Major depressive disorder, recurrent, moderate: Secondary | ICD-10-CM | POA: Diagnosis not present

## 2022-02-15 NOTE — Progress Notes (Signed)
[name] is a 47 y.o. female patient .  Diagnosis 296.30 (Major depressive affective disorder, recurrent episode, unspecified) [n/a]  Symptoms Depressed or irritable mood. (Status: maintained) -- No Description Entered  Diminished interest in or enjoyment of activities. (Status: maintained) -- No Description Entered  Feelings of hopelessness, worthlessness, or inappropriate guilt. (Status: maintained) -- No Description Entered  Lack of energy. (Status: maintained) -- No Description Entered  Unresolved grief issues. (Status: maintained) -- No Description Entered  Medication Status compliance  Safety none  If Suicidal or Homicidal State Action Taken: unspecified  Current Risk: low Medications Hydroxyzine (Dosage: unknown)  Remiron (Dosage: unknown)  Sertraline (Dosage: unknown)  Objectives Related Problem: Appropriately grieve the loss in order to normalize mood and to return to previously adaptive level of functioning. Description: Increasingly verbalize hopeful and positive statements regarding self, others, and the future. Target Date: 2022-07-15 Frequency: Daily Modality: individual Progress: 90%  Related Problem: Appropriately grieve the loss in order to normalize mood and to return to previously adaptive level of functioning. Description: Learn and implement conflict resolution skills to resolve interpersonal problems. Target Date: 2022-07-15 Frequency: Daily Modality: individual Progress: 85%  Related Problem: Appropriately grieve the loss in order to normalize mood and to return to previously adaptive level of functioning. Description: Learn and implement problem-solving and decision-making skills. Target Date: 2022-07-15 Frequency: Daily Modality: individual Progress: 85%  Related Problem: Appropriately grieve the loss in order to  normalize mood and to return to previously adaptive level of functioning. Description: Describe current and past experiences with depression including their impact on functioning and attempts to resolve it. Target Date: 2021-07-14 Frequency: Daily Modality: individual Progress: 100%-completed  Related Problem: Complete the process of letting go of the lost significant other. Description: Report decreased time spent each day focusing on the loss. Target Date: 2022-07-15 Frequency: Daily Modality: individual Progress: 85%  Related Problem: Complete the process of letting go of the lost significant other. Description: Decrease unrealistic thoughts, statements, and feelings of being responsible for the loss. Target Date: 2021-07-14 Frequency: Daily Modality: individual Progress: 100%-completed  Related Problem: Complete the process of letting go of the lost significant other. Description: Verbalize and resolve feelings of anger or guilt focused on self or deceased loved one that interfere with the grieving process. Target Date: 2021-07-14 Frequency: Daily Modality: individual Progress: 100%-completed  Related Problem: Complete the process of letting go of the lost significant other. Description: Begin verbalizing feelings associated with the loss. Target Date: 2022-07-15 Frequency: Daily Modality: individual Progress: 80%  Related Problem: Complete the process of letting go of the lost significant other. Description: Identify what stages of grief have been experienced in the continuum of the grieving process. Target Date: 2021-07-14 Frequency: Daily Modality: individual Progress: 100%-completed  Related Problem: Complete the process of letting go of the lost significant other. Description: Participate in a therapy that addresses issues beyond grief that have arisen as a result of the loss. Target Date: 2022-07-15 Frequency: Daily Modality: individual Progress: 85%  Related  Problem: Complete the process of letting go of the lost significant other. Description: Tell in detail the story of the current loss that is  triggering symptoms. Target Date: 2021-07-14 Frequency: Daily Modality: individual Progress: 100%-completed  Client Response full compliance  Service Location Location, [address] [place], [place] [address]  Service Code cpt [phone]  Validate/empathize  Related past to present  Behavioral activation plan  Facilitate problem solving  Identify automatic thoughts  Rationally challenge thoughts or beliefs/cognitive restructuring  Identify/label emotions  Emotion regulation skills  Psychiatrist  Self care activities  Self-monitoring  Session Notes: Patient requests a video Webex session. She is at home and I am at my home office.  Dx: Major Depression  Meds: Lexapro ('20mg'$ ), Busperone (7.5)  Goals: States that she is seeking counseling to learn to manage the grief reaction to losing her husband. She reports feeling tired all the time and that she has no energy or motivation. Needs to develop coping strategies. Therapy to focus on reducing despair, sadness, hopelessness and helplessness. 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 Will explore those early traumas/losses and current triggers. Goal dates: 04-2021 Revised Goals:6-24 Patient states that she is restarting her class in college because she had to stop last session due to sickness. She is not discouraged and plans to move forward. She talked about the ongoing tension in the family with her brother and her sister in law. They are very hostile to her and it is still distressing to her the way her parents continue to support him. We discussed the need to accept that she is powerless to change that situation and cannot let it hold her back. She recognizes the need to separate, but it will be a slow process.                                                [name], PhD Time: 7:57V-7:28A 45 minutes

## 2022-02-16 ENCOUNTER — Encounter: Payer: Medicare HMO | Admitting: Physician Assistant

## 2022-02-16 DIAGNOSIS — L97512 Non-pressure chronic ulcer of other part of right foot with fat layer exposed: Secondary | ICD-10-CM | POA: Diagnosis not present

## 2022-02-16 DIAGNOSIS — Z89411 Acquired absence of right great toe: Secondary | ICD-10-CM | POA: Diagnosis not present

## 2022-02-16 DIAGNOSIS — G7100 Muscular dystrophy, unspecified: Secondary | ICD-10-CM | POA: Diagnosis not present

## 2022-02-16 DIAGNOSIS — L84 Corns and callosities: Secondary | ICD-10-CM | POA: Diagnosis not present

## 2022-02-16 DIAGNOSIS — G600 Hereditary motor and sensory neuropathy: Secondary | ICD-10-CM | POA: Diagnosis not present

## 2022-02-16 DIAGNOSIS — L02611 Cutaneous abscess of right foot: Secondary | ICD-10-CM | POA: Diagnosis not present

## 2022-02-16 DIAGNOSIS — M86671 Other chronic osteomyelitis, right ankle and foot: Secondary | ICD-10-CM | POA: Diagnosis not present

## 2022-02-16 DIAGNOSIS — F17210 Nicotine dependence, cigarettes, uncomplicated: Secondary | ICD-10-CM | POA: Diagnosis not present

## 2022-02-16 DIAGNOSIS — Z89412 Acquired absence of left great toe: Secondary | ICD-10-CM | POA: Diagnosis not present

## 2022-02-18 NOTE — Progress Notes (Signed)
ACHSAH, MCQUADE ([id number]) [id number] Page 1 of 11 Visit Report for 02/16/2022 Arrival Information Details Patient Name: Date of Service: [name]. 02/16/2022 9:15 A [name]: [id number] Patient Account Number: [id number] Date of Birth/Sex: Treating RN: 1975-10-27 (47 y.o. [name] Primary Care Amaris Delafuente: [name] Other Clinician: [name] Referring Andrw Mcguirt: Treating Curran Lenderman/Extender: [name] in Treatment: 25 Visit Information History Since Last Visit All ordered tests and consults were completed: No Patient Arrived: Ambulatory Added or deleted any medications: No Arrival Time: 09:22 Any new allergies or adverse reactions: No Transfer Assistance: None Had a fall or experienced change in No Patient Identification Verified: Yes activities of daily living that may affect Secondary Verification Process Completed: Yes risk of falls: Patient Requires Transmission-Based Precautions: No Signs or symptoms of abuse/neglect since last visito No Patient Has Alerts: Yes Hospitalized since last visit: No Patient Alerts: ABI R 1.15 02/04/21 Implantable device outside of the clinic excluding No ABI L 1.11 02/04/21 cellular tissue based products placed in the center since last visit: Has Dressing in Place as Prescribed: Yes Pain Present Now: No Electronic Signature(s) Signed: 02/16/2022 4:19:16 PM By: [name] Entered By: [name] on 02/16/2022 09:22:54 -------------------------------------------------------------------------------- Clinic Level of Care Assessment Details Patient Name: Date of Service: [name]. 02/16/2022 9:15 A [name]: [id number] Patient Account Number: [id number] Date of Birth/Sex: Treating RN: [birth year] (47 y.o. [name] Primary Care Uzziah Rigg: [name] Other Clinician: [name] Referring Braelyn Bordonaro: Treating  Isyss Espinal/Extender: [name] in Treatment: 25 Clinic Level of Care Assessment Items TOOL 1 Quantity Score '[]'$  - 0 Use when EandM and Procedure is performed on INITIAL visit ASSESSMENTS - Nursing Assessment / Reassessment '[]'$  - 0 General Physical Exam (combine w/ comprehensive assessment (listed just below) when performed on new pt. evals) '[]'$  - 0 Comprehensive Assessment (HX, ROS, Risk Assessments, Wounds Hx, etc.) Snarski, [name] ([id number]) [id number] Page 2 of 11 ASSESSMENTS - Wound and Skin Assessment / Reassessment '[]'$  - 0 Dermatologic / Skin Assessment (not related to wound area) ASSESSMENTS - Ostomy and/or Continence Assessment and Care '[]'$  - 0 Incontinence Assessment and Management '[]'$  - 0 Ostomy Care Assessment and Management (repouching, etc.) PROCESS - Coordination of Care '[]'$  - 0 Simple Patient / Family Education for ongoing care '[]'$  - 0 Complex (extensive) Patient / Family Education for ongoing care '[]'$  - 0 Staff obtains Programmer, systems, Records, T Results / Process Orders est '[]'$  - 0 Staff telephones HHA, Nursing Homes / Clarify orders / etc '[]'$  - 0 Routine Transfer to another Facility (non-emergent condition) '[]'$  - 0 Routine Hospital Admission (non-emergent condition) '[]'$  - 0 New Admissions / Biomedical engineer / Ordering NPWT Apligraf, etc. , '[]'$  - 0 Emergency Hospital Admission (emergent condition) PROCESS - Special Needs '[]'$  - 0 Pediatric / Minor Patient Management '[]'$  - 0 Isolation Patient Management '[]'$  - 0 Hearing / Language / Visual special needs '[]'$  - 0 Assessment of Community assistance (transportation, D/C planning, etc.) '[]'$  - 0 Additional assistance / Altered mentation '[]'$  - 0 Support Surface(s) Assessment (bed, cushion, seat, etc.) INTERVENTIONS - Miscellaneous '[]'$  - 0 External ear exam '[]'$  - 0 Patient Transfer (multiple staff / Civil Service fast streamer / Similar devices) '[]'$  - 0 Simple Staple / Suture removal (25  or less) '[]'$  - 0 Complex Staple / Suture removal (26 or more) '[]'$  - 0 Hypo/Hyperglycemic Management (do not check if billed separately) '[]'$  - 0 Ankle /  Brachial Index (ABI) - do not check if billed separately Has the patient been seen at the hospital within the last three years: Yes Total Score: 0 Level Of Care: ____ Electronic Signature(s) Signed: 02/16/2022 4:19:16 PM By: [name] Entered By: [name] on 02/16/2022 09:56:05 -------------------------------------------------------------------------------- Encounter Discharge Information Details Patient Name: Date of Service: [name], Kelly Mons. 02/16/2022 9:15 A [name]: [id number] Patient Account Number: [id number] Date of Birth/Sex: Treating RN: Jan 02, 1976 (47 y.o. [name] Primary Care Cathy Crounse: [name] Other Clinician: [name] Referring Derric Dealmeida: Treating Pearson Picou/Extender: Melchor Amour [place], [name] Jerilynn Mages ([id number]) [id number] Page 3 of 11 Weeks in Treatment: 25 Encounter Discharge Information Items Post Procedure Vitals Discharge Condition: Stable Temperature (F): 98.7 Ambulatory Status: Ambulatory Pulse (bpm): 92 Discharge Destination: Home Respiratory Rate (breaths/min): 16 Transportation: Private Auto Blood Pressure (mmHg): 112/74 Accompanied By: self Schedule Follow-up Appointment: Yes Clinical Summary of Care: Electronic Signature(s) Signed: 02/16/2022 4:19:16 PM By: [name] Entered By: [name] on 02/16/2022 10:02:25 -------------------------------------------------------------------------------- Lower Extremity Assessment Details Patient Name: Date of Service: [name]. 02/16/2022 9:15 A [name]: [id number] Patient Account Number: [id number] Date of Birth/Sex: Treating RN: 03-13-1975 (47 y.o. [name] Primary Care Layten Aiken: [name] Other Clinician: [name] Referring Philana Younis: Treating Joshus Rogan/Extender: [name] in Treatment: 25 Electronic Signature(s) Signed: 02/16/2022 4:19:16 PM By: [name] Signed: 02/17/2022 7:35:08 PM By: Gretta Cool BSN, RN, CWS, Kim RN, BSN Entered By: [name] on 02/16/2022 09:33:22 -------------------------------------------------------------------------------- Multi Wound Chart Details Patient Name: Date of Service: [name], Kelly Mons. 02/16/2022 9:15 A [name]: [id number] Patient Account Number: [id number] Date of Birth/Sex: Treating RN: 29-Oct-1975 (47 y.o. [name] Primary Care Clayvon Parlett: [name] Other Clinician: [name] Referring Madelena Maturin: Treating Karen Huhta/Extender: [name] in Treatment: 25 Vital Signs Height(in): 67 Pulse(bpm): 92 Weight(lbs): 153 Blood Pressure(mmHg): 112/74 Body Mass Index(BMI): 24 Temperature(F): 98.7 Respiratory Rate(breaths/min): 16 [Matthew, [name] ([id number]):Photos:] [[id number].pdf Page 4 of 11:2 3 4] Plantar Foot Dorsal Foot Medial, Dorsal Foot Wound Location: Trauma Blister Blister Wounding Event: Abscess Abrasion Abrasion Primary Etiology: Anemia, Asthma, History of pressure Anemia, Asthma, History of pressure Anemia, Asthma, History of pressure Comorbid History: wounds, Osteomyelitis, Neuropathy wounds, Osteomyelitis, Neuropathy wounds, Osteomyelitis, Neuropathy 06/18/2021 01/13/2022 01/23/2022 Date Acquired: '25 4 3 '$ Weeks of Treatment: Open Open Open Wound Status: No No No Wound Recurrence: 0.2x0.3x0.1 0.6x0.7x0.1 0.1x0.1x0.1 Measurements L x W x D (cm) 0.047 0.33 0.008 A (cm) : rea 0.005 0.033 0.001 Volume (cm) : 93.40% 99.30% 88.70% % Reduction in Area: 98.20% 99.60% 92.90% % Reduction in Volume: Full Thickness Without Exposed Full Thickness Without Exposed Full Thickness Without Exposed Classification: Support Structures  Support Structures Support Structures Medium Medium Medium Exudate Amount: Serosanguineous Serosanguineous Serosanguineous Exudate Type: red, brown red, brown red, brown Exudate Color: N/A Flat and Intact N/A Wound Margin: Medium (34-66%) Large (67-100%) Large (67-100%) Granulation Amount: Pink Red Pink Granulation Quality: Medium (34-66%) Small (1-33%) None Present (0%) Necrotic Amount: Fat Layer (Subcutaneous Tissue): Yes Fat Layer (Subcutaneous Tissue): Yes Fat Layer (Subcutaneous Tissue): Yes Exposed Structures: Fascia: No Fascia: No Fascia: No Tendon: No Tendon: No Tendon: No Muscle: No Muscle: No Muscle: No Joint: No Joint: No Joint: No Bone: No Bone: No Bone: No Medium (34-66%) None None Epithelialization: Treatment Notes Electronic Signature(s) Signed: 02/16/2022 4:19:16 PM By: [name] Entered By: [name] on 02/16/2022 09:33:25 -------------------------------------------------------------------------------- Multi-Disciplinary Care Plan Details Patient Name:  Date of Service: [name]. 02/16/2022 9:15 A [name]: [id number] Patient Account Number: [id number] Date of Birth/Sex: Treating RN: March 23, 1975 (47 y.o. [name] Primary Care Hali Balgobin: [name] Other Clinician: [name] Referring Maleki Hippe: Treating Rey Fors/Extender: [name] in Treatment: 25 Active Inactive Wound/Skin Impairment Nursing Diagnoses: [name], [name] ([id number]) [id number] Page 5 of 11 Knowledge deficit related to ulceration/compromised skin integrity Goals: Patient/caregiver will verbalize understanding of skin care regimen Date Initiated: 08/20/2021 Target Resolution Date: 02/20/2022 Goal Status: Active Ulcer/skin breakdown will have a volume reduction of 30% by week 4 Date Initiated: 08/20/2021 Date Inactivated: 01/26/2022 Target Resolution Date: 09/20/2021 Goal Status:  Unmet Unmet Reason: COMORBIDITIES Ulcer/skin breakdown will have a volume reduction of 50% by week 8 Date Initiated: 08/20/2021 Date Inactivated: 01/26/2022 Target Resolution Date: 10/20/2021 Goal Status: Unmet Unmet Reason: COMORBIDITIES Ulcer/skin breakdown will have a volume reduction of 80% by week 12 Date Initiated: 08/20/2021 Date Inactivated: 01/26/2022 Target Resolution Date: 11/20/2021 Goal Status: Unmet Unmet Reason: COMORBIDITIES Ulcer/skin breakdown will heal within 14 weeks Date Initiated: 08/20/2021 Date Inactivated: 01/26/2022 Target Resolution Date: 12/20/2021 Goal Status: Unmet Unmet Reason: COMORBIDITIES Interventions: Assess patient/caregiver ability to obtain necessary supplies Assess patient/caregiver ability to perform ulcer/skin care regimen upon admission and as needed Assess ulceration(s) every visit Notes: Electronic Signature(s) Signed: 02/16/2022 4:19:16 PM By: [name] Signed: 02/17/2022 7:35:08 PM By: Gretta Cool, BSN, RN, CWS, Kim RN, BSN Entered By: [name] on 02/16/2022 10:01:42 -------------------------------------------------------------------------------- Pain Assessment Details Patient Name: Date of Service: [name], Kelly Mons. 02/16/2022 9:15 A [name]: [id number] Patient Account Number: [id number] Date of Birth/Sex: Treating RN: 11-08-1975 (47 y.o. [name] Primary Care Naia Ruff: [name] Other Clinician: [name] Referring Terriyah Westra: Treating Chantay Whitelock/Extender: [name] in Treatment: 25 Active Problems Location of Pain Severity and Description of Pain Patient Has Paino No Site Locations [name], [name] ([id number]) [id number] Page 6 of 11 Pain Management and Medication Current Pain Management: Electronic Signature(s) Signed: 02/16/2022 4:19:16 PM By: [name] Signed: 02/17/2022 7:35:08 PM By: Gretta Cool, BSN, RN, CWS, Kim RN, BSN Entered By: [name] on 02/16/2022 09:31:56 -------------------------------------------------------------------------------- Patient/Caregiver Education Details Patient Name: Date of Service: [name] 1/30/2024andnbsp9:15 A [name]: [id number] Patient Account Number: [id number] Date of Birth/Gender: Treating RN: 04/18/1975 (47 y.o. [name] Primary Care Physician: [name] Other Clinician: [name] Referring Physician: Treating Physician/Extender: [name] in Treatment: 25 Education Assessment Education Provided To: Patient Education Topics Provided Wound/Skin Impairment: Handouts: Other: continue wound care as directed Methods: Explain/Verbal Responses: State content correctly Electronic Signature(s) Signed: 02/16/2022 4:19:16 PM By: [name] Entered By: [name] on 02/16/2022 10:01:38 -------------------------------------------------------------------------------- Wound Assessment Details Patient Name: Date of Service: [name], Kelly Mons. 02/16/2022 9:15 A [name]: [id number] Patient Account Number: [id number] Date of Birth/Sex: Treating RN: 1975/04/21 (47 y.o. [name] Primary Care Ulric Salzman: [name] Other Clinician: [name] Referring Kaleisha Bhargava: Treating Rylyn Zawistowski/Extender: [name] in Treatment: [address] Wound Status Sherlean Foot, [name] ([id number]) [id number] Page 7 of 11 Wound Number: 2 Primary Abscess Etiology: Wound Location: Plantar Foot Wound Status: Open Wounding Event: Trauma Comorbid Anemia, Asthma, History of pressure wounds, Osteomyelitis, Date Acquired: 06/18/2021 History: Neuropathy Weeks Of Treatment: 25 Clustered Wound: No Photos Wound Measurements Length: (cm) 0.2 Width: (cm) 0.3 Depth: (cm) 0.1 Area: (cm) 0.047 Volume: (cm) 0.005 % Reduction in Area: 93.4% %  Reduction in Volume:  98.2% Epithelialization: Medium (34-66%) Wound Description Classification: Full Thickness Without Exposed Support Structures Exudate Amount: Medium Exudate Type: Serosanguineous Exudate Color: red, brown Foul Odor After Cleansing: No Slough/Fibrino No Wound Bed Granulation Amount: Medium (34-66%) Exposed Structure Granulation Quality: Pink Fascia Exposed: No Necrotic Amount: Medium (34-66%) Fat Layer (Subcutaneous Tissue) Exposed: Yes Tendon Exposed: No Muscle Exposed: No Joint Exposed: No Bone Exposed: No Treatment Notes Wound #2 (Foot) Wound Laterality: Plantar Cleanser Byram Ancillary Kit - 15 Day Supply Discharge Instruction: Use supplies as instructed; Kit contains: (15) Saline Bullets; (15) 3x3 Gauze; 15 pr Gloves Soap and Water Discharge Instruction: Gently cleanse wound with antibacterial soap, rinse and pat dry prior to dressing wounds Peri-Wound Care Topical Primary Dressing Hydrofera Blue Ready Transfer Foam, 2.5x2.5 (in/in) Discharge Instruction: Apply Hydrofera Blue Ready to wound bed as directed Secondary Dressing (BORDER) Zetuvit Plus SILICONE BORDER Dressing 4x4 (in/in) Discharge Instruction: Please do not put silicone bordered dressings under wraps. Use non-bordered dressing only. Secured With Compression Wrap Compression Stockings Environmental education officer) Signed: 02/16/2022 4:19:16 PM By: Jarvis Newcomer, Icela [name] ([id number]) [id number] Page 8 of 11 Signed: 02/17/2022 7:35:08 PM By: Gretta Cool, BSN, RN, CWS, Kim RN, BSN Entered By: [name] on 02/16/2022 09:32:19 -------------------------------------------------------------------------------- Wound Assessment Details Patient Name: Date of Service: [name]. 02/16/2022 9:15 A [name]: [id number] Patient Account Number: [id number] Date of Birth/Sex: Treating RN: Jan 28, 1975 (47 y.o. Charolette Forward, [name] Primary Care Doreene Forrey: [name] Other Clinician: [name] Referring Zainah Steven: Treating Terrianne Cavness/Extender: [name] in Treatment: 25 Wound Status Wound Number: 3 Primary Abrasion Etiology: Wound Location: Dorsal Foot Wound Status: Open Wounding Event: Blister Comorbid Anemia, Asthma, History of pressure wounds, Osteomyelitis, Date Acquired: 01/13/2022 History: Neuropathy Weeks Of Treatment: 4 Clustered Wound: No Photos Wound Measurements Length: (cm) 0.6 Width: (cm) 0.7 Depth: (cm) 0.1 Area: (cm) 0.33 Volume: (cm) 0.033 % Reduction in Area: 99.3% % Reduction in Volume: 99.6% Epithelialization: None Wound Description Classification: Full Thickness Without Exposed Suppor Wound Margin: Flat and Intact Exudate Amount: Medium Exudate Type: Serosanguineous Exudate Color: red, brown t Structures Foul Odor After Cleansing: No Slough/Fibrino Yes Wound Bed Granulation Amount: Large (67-100%) Exposed Structure Granulation Quality: Red Fascia Exposed: No Necrotic Amount: Small (1-33%) Fat Layer (Subcutaneous Tissue) Exposed: Yes Necrotic Quality: Adherent Slough Tendon Exposed: No Muscle Exposed: No Joint Exposed: No Bone Exposed: No Treatment Notes Wound #3 (Foot) Wound Laterality: Dorsal [place], [name] ([id number]) [id number] Page 9 of 11 Cleanser Byram Ancillary Kit - 15 Day Supply Discharge Instruction: Use supplies as instructed; Kit contains: (15) Saline Bullets; (15) 3x3 Gauze; 15 pr Gloves Soap and Water Discharge Instruction: Gently cleanse wound with antibacterial soap, rinse and pat dry prior to dressing wounds Peri-Wound Care Topical Primary Dressing Hydrofera Blue Ready Transfer Foam, 2.5x2.5 (in/in) Discharge Instruction: Apply Hydrofera Blue Ready to wound bed as directed Secondary Dressing (BORDER) Zetuvit Plus SILICONE BORDER Dressing 4x4 (in/in) Discharge Instruction: Please do not put silicone bordered dressings  under wraps. Use non-bordered dressing only. Secured With Compression Wrap Compression Stockings Environmental education officer) Signed: 02/16/2022 4:19:16 PM By: [name] Signed: 02/17/2022 7:35:08 PM By: Gretta Cool, BSN, RN, CWS, Kim RN, BSN Entered By: [name] on 02/16/2022 09:32:48 -------------------------------------------------------------------------------- Wound Assessment Details Patient Name: Date of Service: [name], Kelly Mons. 02/16/2022 9:15 A [name]: [id number] Patient Account Number: [id number] Date of Birth/Sex: Treating RN: 27-Oct-1975 (47 y.o. [name] Primary Care Odalis Jordan: [name]  Other Clinician: [name] Referring Noraa Pickeral: Treating Leea Rambeau/Extender: [name] in Treatment: 25 Wound Status Wound Number: 4 Primary Abrasion Etiology: Wound Location: Medial, Dorsal Foot Wound Status: Healed - Epithelialized Wounding Event: Blister Comorbid Anemia, Asthma, History of pressure wounds, Osteomyelitis, Date Acquired: 01/23/2022 History: Neuropathy Weeks Of Treatment: 3 Clustered Wound: No Photos [name] ([id number]) [id number] Page 10 of 11 Wound Measurements Length: (cm) Width: (cm) Depth: (cm) Area: (cm) Volume: (cm) 0 % Reduction in Area: 100% 0 % Reduction in Volume: 100% 0 Epithelialization: None 0 0 Wound Description Classification: Full Thickness Without Exposed Suppor Exudate Amount: Medium Exudate Type: Serosanguineous Exudate Color: red, brown t Structures Foul Odor After Cleansing: No Slough/Fibrino No Wound Bed Granulation Amount: Large (67-100%) Exposed Structure Granulation Quality: Pink Fascia Exposed: No Necrotic Amount: None Present (0%) Fat Layer (Subcutaneous Tissue) Exposed: Yes Tendon Exposed: No Muscle Exposed: No Joint Exposed: No Bone Exposed: No Treatment Notes Wound #4 (Foot) Wound Laterality: Dorsal,  Medial Cleanser Peri-Wound Care Topical Primary Dressing Secondary Dressing Secured With Compression Wrap Compression Stockings Add-Ons Electronic Signature(s) Signed: 02/16/2022 4:19:16 PM By: [name] Signed: 02/17/2022 7:35:08 PM By: Gretta Cool, BSN, RN, CWS, Kim RN, BSN Entered By: [name] on 02/16/2022 09:49:04 -------------------------------------------------------------------------------- Vitals Details Patient Name: Date of Service: [name], Kelly Grills [name]. 02/16/2022 9:15 A [name]: [id number] Patient Account Number: [id number] Date of Birth/Sex: Treating RN: [birth year] (47 y.o. [name] Primary Care Noraa Pickeral: [name] Other Clinician: [name] Referring Jake Goodson: Treating Malikye Reppond/Extender: [name] in Treatment: 25 Vital Signs Time Taken: 09:23 Temperature (F): 98.7 Height (in): 67 Pulse (bpm): 92 Weight (lbs): 153 Respiratory Rate (breaths/min): 16 Favata, Makynleigh [name] ([id number]) [id number] Page 11 of 11 Body Mass Index (BMI): 24 Blood Pressure (mmHg): 112/74 Reference Range: 80 - 120 mg / dl Electronic Signature(s) Signed: 02/16/2022 4:19:16 PM By: [name] Entered By: [name] on 02/16/2022 09:24:53

## 2022-02-18 NOTE — Progress Notes (Signed)
ANEEKA, BOWDEN ([id number]) [id number] Page 1 of 9 Visit Report for 02/16/2022 Chief Complaint Document Details Patient Name: Date of Service: [name]. 02/16/2022 9:15 A [name]: [id number] Patient Account Number: [id number] Date of Birth/Sex: Treating RN: Sep 30, 1975 (47 y.o. [name] Primary Care Provider: [name] Other Clinician: [name] Referring Provider: Treating Provider/Extender: [name] in Treatment: 25 Information Obtained from: Patient Chief Complaint Right foot ulcer Electronic Signature(s) Signed: 02/16/2022 1:52:30 PM By: [name] Entered By: Worthy Keeler on 02/16/2022 13:52:30 -------------------------------------------------------------------------------- Debridement Details Patient Name: Date of Service: [name], Kelly Mons. 02/16/2022 9:15 A [name]: [id number] Patient Account Number: [id number] Date of Birth/Sex: Treating RN: [birth year] (47 y.o. [name] Primary Care Provider: [name] Other Clinician: [name] Referring Provider: Treating Provider/Extender: [name] in Treatment: 25 Debridement Performed for Assessment: Wound #2 Plantar Foot Performed By: Physician [name], [name] Debridement Type: Debridement Level of Consciousness (Pre-procedure): Awake and Alert Pre-procedure Verification/Time Out Yes - 09:50 Taken: Start Time: 09:50 T Area Debrided (L x W): otal 1.5 (cm) x 3 (cm) = 4.5 (cm) Tissue and other material debrided: Viable, Non-Viable, Callus, Slough, Subcutaneous, Slough Level: Skin/Subcutaneous Tissue Debridement Description: Excisional Instrument: Curette Bleeding: Minimum Hemostasis Achieved: Pressure Response to Treatment: Procedure was tolerated well Level of Consciousness (Post- Awake and Alert procedure): Post Debridement Measurements of Total  Wound [name], [name] ([id number]) [id number] Page 2 of 9 Length: (cm) 0.2 Width: (cm) 0.3 Depth: (cm) 0.2 Volume: (cm) 0.009 Character of Wound/Ulcer Post Debridement: Improved Post Procedure Diagnosis Same as Pre-procedure Electronic Signature(s) Signed: 02/16/2022 4:19:16 PM By: [name] Signed: 02/16/2022 4:45:54 PM By: [name] Signed: 02/17/2022 7:35:08 PM By: Gretta Cool, BSN, RN, CWS, Kim RN, BSN Entered By: [name] on 02/16/2022 09:53:56 -------------------------------------------------------------------------------- HPI Details Patient Name: Date of Service: [name], Kelly Mons. 02/16/2022 9:15 A [name]: [id number] Patient Account Number: [id number] Date of Birth/Sex: Treating RN: [birth year] (47 y.o. [name] Primary Care Provider: [name] Other Clinician: [name] Referring Provider: Treating Provider/Extender: [name] in Treatment: 25 History of Present Illness HPI Description: 47 year old patient sent to [name] by her PCP 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 Her notes were reviewed and there was extensive history of opioid treatment and management by the [facility] pain clinic from where she has been terminated. X-ray of the right foot foot was done on 03/13/2015 and was suggestive of active infection and possible osteomyelitis. her past surgical history includes amputation of the left big toe in 2011 and amputation of the right big toe in 2015. She was sitting the wound clinic at [facility] and we will try and obtain these notes. She is a smoker and smokes about 15 cigarettes a day. 06/12/2015 - x-ray of the right foot -- IMPRESSION:1. Soft tissue swelling of the plantar surface of the foot. No  underlying acute abnormality. 2. Amputation deformity again of the right great toe. Old healed bony deformity noted the right second metatarsal. No acute bony abnormality identified. We have not received any reports from the [facility] hospital yet. She continues to smoke at least 10 cigarettes a day 06/19/2015 -- she has increasing pain in the right foot and  also has had some low-grade fever. Her vascular test is scheduled for tomorrow and her MRIs not till next week. Readmission: 08-20-2021 upon evaluation today patient presents for initial inspection here in our clinic although she has been seen that was back in 2017. At that time she was having a issue with her right foot. With that being said upon inspection today she is actually having issues with the right foot as well on the medial aspect where she has some breakdown due to what I am assuming is pressure to the foot location. 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 I did review her culture report which showed evidence of bacteria including hemolytic Streptococcus group A as well as Staphylococcus aureus. The patient is currently on doxycycline and tells me that this is getting much better. T opical gentamicin is also being utilized. Also did review her arterial studies which show that she has on the right and ABI of 1.15 which is triphasic and appears to be excellent there is no significant peripheral artery disease noted the left is also. With ABI of 1.11 and a good report in that regard as well. Lastly I did also review her MRI of the right forefoot which was performed on 07-06-2021. This shows that she does have mild subcortical marrow edema in the stump of the first metatarsal concerning for osteomyelitis. There is also a malunited fracture of the second metatarsal. The  patient has bone marrow edema in the base of the second third fourth and fifth metatarsals concerning for stress reaction obviously this infection is going to be an issue for [name] here as well she needs to be treated appropriately and I do believe that the doxycycline is a good option right now although I may need to #1 extend this and #2 plan for even altering or adding to the treatment regimen depending on how things progress. Patient voiced understanding.Marland Kitchen 09-01-2021 upon evaluation today patient appears to be doing excellent in regard to her wound. She is actually showing signs of excellent improvement which is great news. Fortunately I do not see any signs of active infection locally or systemically at this point which is great news and overall I am extremely pleased with where things stand today. I do not see any evidence of active infection locally or systemically. 09-07-2021 upon evaluation today patient appears to be doing well with regard to her foot ulcer. This is actually showing signs of improvement which is great news. Fortunately there does not appear to be any signs of active infection locally or systemically at this time which is as well. 09-22-2021 upon evaluation today patient appears to be doing well currently in regard to her wound. This is measuring smaller and looking better were slowly making progress here. Fortunately there does not appear to be any evidence of active infection locally or systemically at this time. Upon inspection patient's [name], [name] ([id number]) [id number] Page 3 of 9 wound bed showed evidence of good granulation and epithelization at this point. 10-05-2021 upon evaluation today patient's wound shows a lot of callus buildup but the surface of the wound is actually doing quite well. She is going require some sharp debridement to clearway some of the necrotic debris. 10-19-2021 upon evaluation today patient appears to be doing better  in regard to her wound which is actually measuring  little bit smaller today. Fortunately there does not appear to be any signs of significant infection she does have quite a bit of callus we will get have to work on that today. Otherwise I think we are headed in the appropriate direction. 11-09-2021 upon evaluation today patient appears to be doing well in regard to her wound from the standpoint of infection and actually appears to be doing better she actually just completed the doxycycline. With that being said I am hopeful that this took care of the osteomyelitis although that something that we will definitely keep a close eye on. With that being said I do believe that she is still having tremendous amount of callus buildup which I believe in turn is a result of her having issues here with Friction and callus buildup. With that being said I do believe that the patient would benefit here from a total contact cast I discussed that with her today. I do want to get more aggressive to get this closed as I think that if we do not then she is more likely to have a long standing issue here going forward. 11-17-2021 upon evaluation today patient appears to be doing well although her wound is still open. I do think that she is ready to be placed in a total contact cast. She actually has come back on Thursday to have this changed out. Fortunately and overall I do not see any signs of worsening and I think that we should be able to get this on without any complication or problem.. 11-19-2021 upon evaluation today patient appears to be doing well currently in regard to her wound. She has been tolerating the dressing changes without complication and overall I am extremely pleased with where we stand today. I do not see any evidence of active infection locally or systemically at this time which is great news. I think the total contact cast did an awesome job for her. 11/9; right foot in the setting of idiopathic  peripheral neuropathy and Charcot deformity previous amputations. Only a very small area remains on the plantar foot. I had to use illumination to even see anything that looked open here. Much improved 12-03-2021 upon evaluation today patient appears to be doing well currently in regard to her wound. Of note she tells me that she did end up cutting off her cast with a pair of kitchen shears 2 nights ago. She states that she has been walking on it more than she should have after the cast was put on due to a friend who ended up in the hospital that it caught her right after she had a put on that same day. She feels like she probably deformed the cast that it was causing some pressure points that she was afraid to get a blister and did not want another wound. For that reason she did go ahead and cut the soft and does not appear to have caused any damage. She tells me that it only took 7 minutes. With that being said I do think the wound actually looks really good she has a lot of callus buildup I am going to remove that just to make sure nothing side underneath but other than that overall she seems to be doing quite well. 12-07-2021 upon evaluation today patient's wound is actually showing signs of excellent improvement and very pleased with where we stand I think we are headed in the right direction. Fortunately there does not appear to be any signs of active infection  at this time which is great news. No fevers, chills, nausea, vomiting, or diarrhea. 01-14-2022 upon evaluation today patient appears to be doing well currently in regard to her wound she does have a bit of callus buildup unfortunately but otherwise does not seem to show any signs of active infection locally or systemically at this time. Fortunately I do not see any evidence of infection locally or systemically which is great news. She does have a lot of callus buildup she has been very sick so has not been in to be seen recently. With that  being said I do not think her wound is a whole lot worse than what it was before but it is also a lot more callused over so it is difficult to tell initially. 01-19-2022 upon evaluation today patient appears to be doing poorly unfortunately in regard to her wound. She has been tolerating the dressing changes without complication prior but unfortunately in the past week she has developed issues with redness and erythema on her foot with a new wound on the top of her foot which was not there last time I saw her. Unfortunately this seems to be getting significantly worse she is also had temperatures ranging up to 103 although it was 99.9 here in the office today it was not nearly that bad. Fortunately I do not see any signs obvious of sepsis but nonetheless I am concerned about the significance of this infection I think she does need to probably get into the ER ASAP in order to be evaluated and treated appropriately based on what we are seeing. The patient is very worried about the situation here. With that being said she tells me that she has been wearing even prior to coming in today. 01-26-2022 upon evaluation today patient actually appears to be doing significantly better despite the fact she did not go to the ER last week she tells me she had a lot going on with family and otherwise just was not able to make it. Fortunately there does not appear to be any signs of infection locally or systemically at this time. 02-16-2022 upon evaluation today patient appears to be doing well the wound on the top of her foot is looking smaller the wound on the bottom/medial portion of her foot is going require some debridement today to clearway some of the necrotic debris but overall looks to be doing quite well. I do not see any evidence of infection locally nor systemically at this time which is great news. Electronic Signature(s) Signed: 02/16/2022 1:52:57 PM By: [name] Entered By: Worthy Keeler on  02/16/2022 13:52:56 -------------------------------------------------------------------------------- Physical Exam Details Patient Name: Date of Service: [name]. 02/16/2022 9:15 A [name]: [id number] Patient Account Number: [id number] Date of Birth/Sex: Treating RN: [birth year] (47 y.o. [name] Primary Care Provider: [name] Other Clinician: [name] Referring Provider: Treating Provider/Extender: [name] in Treatment: [address] [place] Jerilynn Mages ([id number]) [id number] Page 4 of 9 Constitutional Well-nourished and well-hydrated in no acute distress. Respiratory normal breathing without difficulty. Psychiatric this patient is able to make decisions and demonstrates good insight into disease process. Alert and Oriented x 3. pleasant and cooperative. Notes Upon inspection patient's wound bed showed signs again of doing well on the top of the foot no debridement noted here and I did recommend a continuation of therapy with the Hydrofera Blue. With regard to the medial/plantar aspect of the foot I am going to  go ahead and perform debridement to clearway some of the necrotic debris patient tolerated that today without complication and postdebridement appears to be doing significantly better. Electronic Signature(s) Signed: 02/16/2022 1:53:31 PM By: [name] Entered By: Worthy Keeler on 02/16/2022 13:53:31 -------------------------------------------------------------------------------- Physician Orders Details Patient Name: Date of Service: [name], Kelly Mons. 02/16/2022 9:15 A [name]: [id number] Patient Account Number: [id number] Date of Birth/Sex: Treating RN: November 20, 1975 (47 y.o. [name] Primary Care Provider: [name] Other Clinician: [name] Referring Provider: Treating Provider/Extender: [name] in Treatment:  25 Verbal / Phone Orders: No Diagnosis Coding Follow-up Appointments Return Appointment in 1 week. Bathing/ Shower/ Hygiene May shower; gently cleanse wound with antibacterial soap, rinse and pat dry prior to dressing wounds No tub bath. Anesthetic (Use 'Patient Medications' Section for Anesthetic Order Entry) Lidocaine applied to wound bed Edema Control - Lymphedema / Segmental Compressive Device / Other Elevate, Exercise Daily and A void Standing for Long Periods of Time. Elevate legs to the level of the heart and pump ankles as often as possible Elevate leg(s) parallel to the floor when sitting. Medications-Please add to medication list. Wound #2 Plantar Foot Take one '500mg'$  Tylenol (A cetaminophen) and one '200mg'$  Motrin (Ibuprofen) every 6 hours for pain. Do not take ibuprofen if you are on blood thinners or have stomach ulcers. Wound Treatment Wound #2 - Foot Wound Laterality: Plantar Cleanser: Byram Ancillary Kit - 15 Day Supply (Generic) 3 x Per Week/30 Days Discharge Instructions: Use supplies as instructed; Kit contains: (15) Saline Bullets; (15) 3x3 Gauze; 15 pr Gloves Cleanser: Soap and Water 3 x Per Week/30 Days Discharge Instructions: Gently cleanse wound with antibacterial soap, rinse and pat dry prior to dressing wounds Prim Dressing: Hydrofera Blue Ready Transfer Foam, 2.5x2.5 (in/in) 3 x Per Week/30 Days ary Discharge Instructions: Apply Hydrofera Blue Ready to wound bed as directed Secondary Dressing: (BORDER) Zetuvit Plus SILICONE BORDER Dressing 4x4 (in/in) (DME) (Dispense As Written) 3 x Per Week/30 Days [name], [name] ([id number]) [id number] Page 5 of 9 Discharge Instructions: Please do not put silicone bordered dressings under wraps. Use non-bordered dressing only. Wound #3 - Foot Wound Laterality: Dorsal Cleanser: Byram Ancillary Kit - 15 Day Supply (Generic) 3 x Per Week/30 Days Discharge Instructions: Use supplies as instructed;  Kit contains: (15) Saline Bullets; (15) 3x3 Gauze; 15 pr Gloves Cleanser: Soap and Water 3 x Per Week/30 Days Discharge Instructions: Gently cleanse wound with antibacterial soap, rinse and pat dry prior to dressing wounds Prim Dressing: Hydrofera Blue Ready Transfer Foam, 2.5x2.5 (in/in) 3 x Per Week/30 Days ary Discharge Instructions: Apply Hydrofera Blue Ready to wound bed as directed Secondary Dressing: (BORDER) Zetuvit Plus SILICONE BORDER Dressing 4x4 (in/in) 3 x Per Week/30 Days Discharge Instructions: Please do not put silicone bordered dressings under wraps. Use non-bordered dressing only. Electronic Signature(s) Signed: 02/17/2022 4:51:29 PM By: [name] Signed: 02/18/2022 4:41:53 PM By: [name] Previous Signature: 02/16/2022 4:19:16 PM Version By: [name] Previous Signature: 02/16/2022 4:45:54 PM Version By: [name] Entered By: [name] on 02/17/2022 15:13:41 -------------------------------------------------------------------------------- Problem List Details Patient Name: Date of Service: [name], Kelly Mons. 02/16/2022 9:15 A [name]: [id number] Patient Account Number: [id number] Date of Birth/Sex: Treating RN: 1975-06-20 (47 y.o. 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 02/16/2022 9:15 A [name]: [id number] Patient Account Number: [id number] Date of Birth/Sex: Treating RN: [birth year] (47 y.o. [name] Primary Care Provider: [name] Other Clinician: [name] Referring Provider: Treating Provider/Extender: [name] in Treatment: 25 Subjective Chief Complaint Information obtained from Patient Right foot ulcer History of Present Illness (HPI) 47 year old patient sent to [name] by her PCP 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 Her notes were reviewed and there was extensive history of opioid treatment and management by the [facility] pain clinic from where she has been terminated. X-ray of the right foot foot was done on 03/13/2015 and was suggestive of active infection and possible osteomyelitis. her past surgical history includes amputation of the left big toe in 2011 and amputation of the right big toe in 2015. She was sitting the wound clinic at [facility] and we will try and obtain these notes. She is a smoker and smokes about 15 cigarettes a day. 06/12/2015 - x-ray of the right foot -- IMPRESSION:1. Soft tissue swelling of the plantar surface of the foot. No underlying acute abnormality. 2. Amputation deformity again of the right great toe. Old healed bony deformity noted the right second metatarsal. No acute bony abnormality identified. We have not received any reports from the [facility] hospital yet. She continues to smoke at least  10 cigarettes a day 06/19/2015 -- she has increasing pain in the right foot and also has had some low-grade fever. Her vascular test is scheduled for tomorrow and her MRIs not till next week. Readmission: 08-20-2021 upon evaluation today patient presents for initial inspection here in our clinic although she has been seen that was back in 2017. At that time she was having a issue with her right foot. With that being said upon inspection today she is actually having issues with the right foot as well on the medial aspect where she has some breakdown due to what I am assuming is pressure to the foot location. 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 I did review her culture report which showed evidence of bacteria including hemolytic Streptococcus group A as well as Staphylococcus aureus. The patient is currently on doxycycline and tells me that this is getting much better. T opical gentamicin is also being utilized. Also did review her arterial studies which show that she has on the right and ABI of 1.15 which is triphasic and appears to be excellent there is no significant peripheral artery disease noted the left is also. With ABI of 1.11 and a good report in that regard  as well. Lastly I did also review her MRI of the right forefoot which was performed on 07-06-2021. This shows that she does have mild subcortical marrow edema in the stump of the first metatarsal concerning for osteomyelitis. There is also a malunited fracture of the second metatarsal. The patient has bone marrow edema in the base of the second third fourth and fifth metatarsals concerning for stress reaction obviously this infection is going to be an issue for [name] here as well she needs to be treated appropriately and I do believe that the doxycycline is a good  option right now although I may need to #1 extend this and #2 plan for even altering or adding to the treatment regimen depending on how things progress. Patient voiced understanding.Marland Kitchen 09-01-2021 upon evaluation today patient appears to be doing excellent in regard to her wound. She is actually showing signs of excellent improvement which is great news. Fortunately I do not see any signs of active infection locally or systemically at this point which is great news and overall I am extremely pleased with where things stand today. I do not see any evidence of active infection locally or systemically. 09-07-2021 upon evaluation today patient appears to be doing well with regard to her foot ulcer. This is actually showing signs of improvement which is great news. Fortunately there does not appear to be any signs of active infection locally or systemically at this time which is as well. 09-22-2021 upon evaluation today patient appears to be doing well currently in regard to her wound. This is measuring smaller and looking better were slowly making progress here. Fortunately there does not appear to be any evidence of active infection locally or systemically at this time. Upon inspection patient's wound bed showed evidence of good granulation and epithelization at this point. 10-05-2021 upon evaluation today patient's wound shows a lot of callus buildup but the surface of the wound is actually doing quite well. She is going require some sharp debridement to clearway some of the necrotic debris. 10-19-2021 upon evaluation today patient appears to be doing better in regard to her wound which is actually measuring little bit smaller today. Fortunately there [name], [name] ([id number]) [id number] Page 7 of 9 does not appear to be any signs of significant infection she does have quite a bit of callus we will get have to work on that today. Otherwise I think we are headed in the  appropriate direction. 11-09-2021 upon evaluation today patient appears to be doing well in regard to her wound from the standpoint of infection and actually appears to be doing better she actually just completed the doxycycline. With that being said I am hopeful that this took care of the osteomyelitis although that something that we will definitely keep a close eye on. With that being said I do believe that she is still having tremendous amount of callus buildup which I believe in turn is a result of her having issues here with Friction and callus buildup. With that being said I do believe that the patient would benefit here from a total contact cast I discussed that with her today. I do want to get more aggressive to get this closed as I think that if we do not then she is more likely to have a long standing issue here going forward. 11-17-2021 upon evaluation today patient appears to be doing well although her wound is still open. I do think that she is ready to be placed in a total  contact cast. She actually has come back on Thursday to have this changed out. Fortunately and overall I do not see any signs of worsening and I think that we should be able to get this on without any complication or problem.. 11-19-2021 upon evaluation today patient appears to be doing well currently in regard to her wound. She has been tolerating the dressing changes without complication and overall I am extremely pleased with where we stand today. I do not see any evidence of active infection locally or systemically at this time which is great news. I think the total contact cast did an awesome job for her. 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 Only a very small area remains on the plantar foot. I had to use illumination to even see anything that looked open here. Much improved 12-03-2021 upon evaluation today patient appears to be doing well  currently in regard to her wound. Of note she tells me that she did end up cutting off her cast with a pair of kitchen shears 2 nights ago. She states that she has been walking on it more than she should have after the cast was put on due to a friend who ended up in the hospital that it caught her right after she had a put on that same day. She feels like she probably deformed the cast that it was causing some pressure points that she was afraid to get a blister and did not want another wound. For that reason she did go ahead and cut the soft and does not appear to have caused any damage. She tells me that it only took 7 minutes. With that being said I do think the wound actually looks really good she has a lot of callus buildup I am going to remove that just to make sure nothing side underneath but other than that overall she seems to be doing quite well. 12-07-2021 upon evaluation today patient's wound is actually showing signs of excellent improvement and very pleased with where we stand I think we are headed in the right direction. Fortunately there does not appear to be any signs of active infection at this time which is great news. No fevers, chills, nausea, vomiting, or diarrhea. 01-14-2022 upon evaluation today patient appears to be doing well currently in regard to her wound she does have a bit of callus buildup unfortunately but otherwise does not seem to show any signs of active infection locally or systemically at this time. Fortunately I do not see any evidence of infection locally or systemically which is great news. She does have a lot of callus buildup she has been very sick so has not been in to be seen recently. With that being said I do not think her wound is a whole lot worse than what it was before but it is also a lot more callused over so it is difficult to tell initially. 01-19-2022 upon evaluation today patient appears to be doing poorly unfortunately in regard to her wound. She  has been tolerating the dressing changes without complication prior but unfortunately in the past week she has developed issues with redness and erythema on her foot with a new wound on the top of her foot which was not there last time I saw her. Unfortunately this seems to be getting significantly worse she is also had temperatures ranging up to 103 although it was 99.9 here in the office today it was not  nearly that bad. Fortunately I do not see any signs obvious of sepsis but nonetheless I am concerned about the significance of this infection I think she does need to probably get into the ER ASAP in order to be evaluated and treated appropriately based on what we are seeing. The patient is very worried about the situation here. With that being said she tells me that she has been wearing even prior to coming in today. 01-26-2022 upon evaluation today patient actually appears to be doing significantly better despite the fact she did not go to the ER last week she tells me she had a lot going on with family and otherwise just was not able to make it. Fortunately there does not appear to be any signs of infection locally or systemically at this time. 02-16-2022 upon evaluation today patient appears to be doing well the wound on the top of her foot is looking smaller the wound on the bottom/medial portion of her foot is going require some debridement today to clearway some of the necrotic debris but overall looks to be doing quite well. I do not see any evidence of infection locally nor systemically at this time which is great news. Objective Constitutional Well-nourished and well-hydrated in no acute distress. Vitals Time Taken: 9:23 AM, Height: 67 in, Weight: 153 lbs, BMI: 24, Temperature: 98.7 F, Pulse: 92 bpm, Respiratory Rate: 16 breaths/min, Blood Pressure: 112/74 mmHg. Respiratory normal breathing without difficulty. Psychiatric this patient is able to make decisions and demonstrates good  insight into disease process. Alert and Oriented x 3. pleasant and cooperative. General Notes: Upon inspection patient's wound bed showed signs again of doing well on the top of the foot no debridement noted here and I did recommend a continuation of therapy with the Hydrofera Blue. With regard to the medial/plantar aspect of the foot I am going to go ahead and perform debridement to clearway some of the necrotic debris patient tolerated that today without complication and postdebridement appears to be doing significantly better. Integumentary (Hair, Skin) Wound #2 status is Open. Original cause of wound was Trauma. The date acquired was: 06/18/2021. The wound has been in treatment 25 weeks. The wound is located on the Plantar Foot. The wound measures 0.2cm length x 0.3cm width x 0.1cm depth; 0.047cm^2 area and 0.005cm^3 volume. There is Fat Layer (Subcutaneous Tissue) exposed. There is a medium amount of serosanguineous drainage noted. There is medium (34-66%) pink granulation within the wound bed. There is a medium (34-66%) amount of necrotic tissue within the wound bed. Wound #3 status is Open. Original cause of wound was Blister. The date acquired was: 01/13/2022. The wound has been in treatment 4 weeks. The wound is located on the Dorsal Foot. The wound measures 0.6cm length x 0.7cm width x 0.1cm depth; 0.33cm^2 area and 0.033cm^3 volume. There is Fat Layer (Subcutaneous Tissue) exposed. There is a medium amount of serosanguineous drainage noted. The wound margin is flat and intact. There is large (67-100%) red granulation within the wound bed. There is a small (1-33%) amount of necrotic tissue within the wound bed including Adherent Slough. [name], [name] ([id number]) [id number] Page 8 of 9 Wound #4 status is Healed - Epithelialized. Original cause of wound was Blister. The date acquired was: 01/23/2022. The wound has been in treatment 3 weeks. The wound is located on  the [place]. The wound measures 0cm length x 0cm width x 0cm depth; 0cm^2 area and 0cm^3 volume. There is Fat Layer (Subcutaneous Tissue) exposed.  There is a medium amount of serosanguineous drainage noted. There is large (67-100%) pink granulation within the wound bed. There is no necrotic tissue within the wound bed. 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 There was a Excisional Skin/Subcutaneous Tissue Debridement with a total area of 4.5 sq cm performed by [name], [name]. With the following instrument(s): Curette to remove Viable and Non-Viable tissue/material. Material removed includes Callus, Subcutaneous Tissue, and Slough. A time out was conducted at 09:50, prior to the start of the procedure. A Minimum amount of bleeding was controlled with Pressure. The procedure was tolerated well. Post Debridement Measurements: 0.2cm length x 0.3cm width x 0.2cm depth; 0.009cm^3 volume. Character of Wound/Ulcer Post Debridement is improved. Post procedure Diagnosis Wound #2: Same as Pre-Procedure Plan Follow-up Appointments: Return Appointment in 1 week. Bathing/ Shower/ Hygiene: May shower; gently cleanse wound with antibacterial soap, rinse and pat dry prior to dressing wounds No tub bath. Anesthetic (Use 'Patient Medicaztions' Section for Anesthetic Order Entry): Lidocaine applied to wound bed Edema Control - Lymphedema / Segmental Compressive Device / Other: Elevate, Exercise Daily and Avoid Standing for Long Periods of Time. Elevate legs to the level of the heart and pump ankles as often as possible Elevate leg(s) parallel to the floor when  sitting. Medications-Please add to medication list.: Wound #2 Plantar Foot: T one '500mg'$  Tylenol (Acetaminophen) and one '200mg'$  Motrin (Ibuprofen) every 6 hours for pain. Do not take ibuprofen if you are on blood thinners or ake have stomach ulcers. WOUND #2: - Foot Wound Laterality: Plantar Cleanser: Byram Ancillary Kit - 15 Day Supply (Generic) 3 x Per Week/30 Days Discharge Instructions: Use supplies as instructed; Kit contains: (15) Saline Bullets; (15) 3x3 Gauze; 15 pr Gloves Cleanser: Soap and Water 3 x Per Week/30 Days Discharge Instructions: Gently cleanse wound with antibacterial soap, rinse and pat dry prior to dressing wounds Prim Dressing: Hydrofera Blue Ready Transfer Foam, 2.5x2.5 (in/in) 3 x Per Week/30 Days ary Discharge Instructions: Apply Hydrofera Blue Ready to wound bed as directed Secondary Dressing: (BORDER) Zetuvit Plus SILICONE BORDER Dressing 4x4 (in/in) (Dispense As Written) 3 x Per Week/30 Days Discharge Instructions: Please do not put silicone bordered dressings under wraps. Use non-bordered dressing only. WOUND #3: - Foot Wound Laterality: Dorsal Cleanser: Byram Ancillary Kit - 15 Day Supply (Generic) 3 x Per Week/30 Days Discharge Instructions: Use supplies as instructed; Kit contains: (15) Saline Bullets; (15) 3x3 Gauze; 15 pr Gloves Cleanser: Soap and Water 3 x Per Week/30 Days Discharge Instructions: Gently cleanse wound with antibacterial soap, rinse and pat dry prior to dressing wounds Prim Dressing: Hydrofera Blue Ready Transfer Foam, 2.5x2.5 (in/in) 3 x Per Week/30 Days ary Discharge Instructions: Apply Hydrofera Blue Ready to wound bed as directed Secondary Dressing: (BORDER) Zetuvit Plus SILICONE BORDER Dressing 4x4 (in/in) 3 x Per Week/30 Days Discharge Instructions: Please do not put silicone bordered dressings under wraps. Use non-bordered dressing only. 1. I am going to suggest that we have the patient continue to monitor for any signs of infection  or worsening. Overall I do believe that the patient is making excellent progress here. 2. I am also going to recommend currently that the patient should continue with the bordered foam dressing to cover as  I feel like coupled with the [facility] this is doing a pretty good job here. 3. I am also going to suggest that we continue to monitor for any signs of infection or worsening obviously if anything changes she should let me know as soon as possible. We will see patient back for reevaluation in 1 week here in the clinic. If anything worsens or changes patient will contact our office for additional recommendations. [name], [name] ([id number]) [id number] Page 9 of 9 Electronic Signature(s) Signed: 02/16/2022 1:54:08 PM By: [name] Entered By: Worthy Keeler on 02/16/2022 13:54:07 -------------------------------------------------------------------------------- SuperBill Details Patient Name: Date of Service: [name], [name] 02/16/2022 Medical Record Number: [id number] Patient Account Number: [id number] Date of Birth/Sex: Treating RN: [birth year] (47 y.o. [name] Primary Care Provider: [name] Other Clinician: [name] Referring Provider: Treating Provider/Extender: [name] in Treatment: 25 Diagnosis Coding ICD-10 Codes Code Description [phone] Other chronic osteomyelitis, right ankle and foot L97.512 Non-pressure chronic ulcer of other part of right foot with fat layer exposed G71.00 Muscular dystrophy, unspecified G60.0 Hereditary motor and sensory neuropathy L84 Corns and callosities Facility Procedures : CPT4 Code: [id number] Description: 87579 - DEB SUBQ TISSUE 20 SQ CM/< ICD-10 Diagnosis Description L97.512 Non-pressure chronic ulcer of other part of right foot with fat layer exposed Modifier: Quantity: 1 Physician Procedures : CPT4 Code Description Modifier [id number] - WC  PHYS SUBQ TISS 20 SQ CM ICD-10 Diagnosis Description L97.512 Non-pressure chronic ulcer of other part of right foot with fat layer exposed Quantity: 1 Electronic Signature(s) Signed: 02/16/2022 1:54:21 PM By: [name] Entered By: Worthy Keeler on 02/16/2022 13:54:21

## 2022-03-01 ENCOUNTER — Ambulatory Visit (INDEPENDENT_AMBULATORY_CARE_PROVIDER_SITE_OTHER): Payer: Medicare HMO | Admitting: Psychology

## 2022-03-01 DIAGNOSIS — F331 Major depressive disorder, recurrent, moderate: Secondary | ICD-10-CM | POA: Diagnosis not present

## 2022-03-01 NOTE — Progress Notes (Signed)
[name] is a 47 y.o. female patient .  Diagnosis 296.30 (Major depressive affective disorder, recurrent episode, unspecified) [n/a]  Symptoms Depressed or irritable mood. (Status: maintained) -- No Description Entered  Diminished interest in or enjoyment of activities. (Status: maintained) -- No Description Entered  Feelings of hopelessness, worthlessness, or inappropriate guilt. (Status: maintained) -- No Description Entered  Lack of energy. (Status: maintained) -- No Description Entered  Unresolved grief issues. (Status: maintained) -- No Description Entered  Medication Status compliance  Safety none  If Suicidal or Homicidal State Action Taken: unspecified  Current Risk: low Medications Hydroxyzine (Dosage: unknown)  Remiron (Dosage: unknown)  Sertraline (Dosage: unknown)  Objectives Related Problem: Appropriately grieve the loss in order to normalize mood and to return to previously adaptive level of functioning. Description: Increasingly verbalize hopeful and positive statements regarding self, others, and the future. Target Date: 2022-07-15 Frequency: Daily Modality: individual Progress: 90%  Related Problem: Appropriately grieve the loss in order to normalize mood and to return to previously adaptive level of functioning. Description: Learn and implement conflict resolution skills to resolve interpersonal problems. Target Date: 2022-07-15 Frequency: Daily Modality: individual Progress: 85%  Related Problem: Appropriately grieve the loss in order to normalize mood and to return to previously adaptive level of functioning. Description: Learn and implement problem-solving and decision-making skills. Target Date: 2022-07-15 Frequency: Daily Modality: individual Progress: 85%  Related Problem: Appropriately grieve  the loss in order to normalize mood and to return to previously adaptive level of functioning. Description: Describe current and past experiences with depression including their impact on functioning and attempts to resolve it. Target Date: 2021-07-14 Frequency: Daily Modality: individual Progress: 100%-completed  Related Problem: Complete the process of letting go of the lost significant other. Description: Report decreased time spent each day focusing on the loss. Target Date: 2022-07-15 Frequency: Daily Modality: individual Progress: 85%  Related Problem: Complete the process of letting go of the lost significant other. Description: Decrease unrealistic thoughts, statements, and feelings of being responsible for the loss. Target Date: 2021-07-14 Frequency: Daily Modality: individual Progress: 100%-completed  Related Problem: Complete the process of letting go of the lost significant other. Description: Verbalize and resolve feelings of anger or guilt focused on self or deceased loved one that interfere with the grieving process. Target Date: 2021-07-14 Frequency: Daily Modality: individual Progress: 100%-completed  Related Problem: Complete the process of letting go of the lost significant other. Description: Begin verbalizing feelings associated with the loss. Target Date: 2022-07-15 Frequency: Daily Modality: individual Progress: 80%  Related Problem: Complete the process of letting go of the lost significant other. Description: Identify what stages of grief have been experienced in the continuum of the grieving process. Target Date: 2021-07-14 Frequency: Daily Modality: individual Progress: 100%-completed  Related Problem: Complete the process of letting go of the lost significant other. Description: Participate in a therapy that addresses issues beyond grief that have arisen as a result of the loss. Target Date: 2022-07-15 Frequency: Daily Modality:  individual Progress: 85%  Related Problem: Complete the process of letting go of the  lost significant other. Description: Tell in detail the story of the current loss that is triggering symptoms. Target Date: 2021-07-14 Frequency: Daily Modality: individual Progress: 100%-completed  Client Response full compliance  Service Location Location, [address] [place], [place] [address]  Service Code cpt [phone]  Validate/empathize  Related past to present  Behavioral activation plan  Facilitate problem solving  Identify automatic thoughts  Rationally challenge thoughts or beliefs/cognitive restructuring  Identify/label emotions  Emotion regulation skills  Psychiatrist  Self care activities  Self-monitoring  Session Notes: Patient requests a video Webex session. She is at home and I am at my home office.  Dx: Major Depression  Meds: Lexapro (83m), Busperone (7.5)  Goals: States that she is seeking counseling to learn to manage the grief reaction to losing her husband. She reports feeling tired all the time and that she has no energy or motivation. Needs to develop coping strategies. Therapy to focus on reducing despair, sadness, hopelessness and helplessness. 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 Will explore those early traumas/losses and current triggers. Goal dates: 04-2021 Revised Goals:6-24 Patient states that she is already behind on her school work. She feels it is because she cannot say no to anyone and it distracts her from the work she needs to do. Her self-esteem is fragile and she finds it difficult to set limits and fears she will be rejected. She states that she does not ever feel welcome by her family and they have no respect or understanding of her needs. She is questioning whether her medication is working because she is feeling more depressed lately. [name] quite upset and recognizes that she is  making decisions that are not in her best interest. We talked about setting her priorities and the need to assert them with family and friends. She states she is feeling more motivated and will start to plan a schedule that emphasizes her priorities. She will also "consider" talking with her doctor about a medication change if her moods do not shift into a more positive direction.                                                     GMarcelina Morel PhD Time: 7:40a-8:30a 50 minutes

## 2022-03-02 ENCOUNTER — Encounter: Payer: Medicare HMO | Attending: Physician Assistant | Admitting: Physician Assistant

## 2022-03-02 ENCOUNTER — Other Ambulatory Visit
Admission: RE | Admit: 2022-03-02 | Discharge: 2022-03-02 | Disposition: A | Discharge status: Home / Self Care | Payer: Medicare HMO | Source: Ambulatory Visit | Attending: Physician Assistant | Admitting: Physician Assistant

## 2022-03-02 DIAGNOSIS — L089 Local infection of the skin and subcutaneous tissue, unspecified: Secondary | ICD-10-CM | POA: Insufficient documentation

## 2022-03-02 DIAGNOSIS — G600 Hereditary motor and sensory neuropathy: Secondary | ICD-10-CM | POA: Insufficient documentation

## 2022-03-02 DIAGNOSIS — G7100 Muscular dystrophy, unspecified: Secondary | ICD-10-CM | POA: Diagnosis not present

## 2022-03-02 DIAGNOSIS — L97512 Non-pressure chronic ulcer of other part of right foot with fat layer exposed: Secondary | ICD-10-CM | POA: Insufficient documentation

## 2022-03-02 DIAGNOSIS — L84 Corns and callosities: Secondary | ICD-10-CM | POA: Insufficient documentation

## 2022-03-02 DIAGNOSIS — F17210 Nicotine dependence, cigarettes, uncomplicated: Secondary | ICD-10-CM | POA: Diagnosis not present

## 2022-03-02 DIAGNOSIS — L02611 Cutaneous abscess of right foot: Secondary | ICD-10-CM | POA: Diagnosis not present

## 2022-03-02 DIAGNOSIS — G8929 Other chronic pain: Secondary | ICD-10-CM | POA: Diagnosis not present

## 2022-03-02 DIAGNOSIS — M86671 Other chronic osteomyelitis, right ankle and foot: Secondary | ICD-10-CM | POA: Diagnosis not present

## 2022-03-02 NOTE — Progress Notes (Addendum)
MEAGAN, REDDY ([id number]) [id number] Page 1 of 10 Visit Report for 03/02/2022 Chief Complaint Document Details Patient Name: Date of Service: Kelly [name]. 03/02/2022 9:15 A [name]: [id number] Patient Account Number: [id number] Date of Birth/Sex: Treating RN: [birth year] (47 y.o. Kelly [name] Primary Care Provider: Tommi [name] Other Clinician: Massie [name] Referring Provider: Treating Provider/Extender: Kelly [name] in Treatment: 27 Information Obtained from: Patient Chief Complaint Right [name] ulcer Electronic Signature(s) Signed: 03/02/2022 9:26:55 AM By: [name] Entered By: Worthy [name] on 03/02/2022 09:26:55 -------------------------------------------------------------------------------- Debridement Details Patient Name: Date of Service: Kelly [name], Kelly [name]. 03/02/2022 9:15 A [name]: [id number] Patient Account Number: [id number] Date of Birth/Sex: Treating RN: 06-06-1975 (47 y.o. Kelly [name] Primary Care Provider: Tommi [name] Other Clinician: Massie [name] Referring Provider: Treating Provider/Extender: Kelly [name] in Treatment: 27 Debridement Performed for Assessment: Wound #2 Plantar [name] Performed By: Physician Kelly [name]., [name] Debridement Type: Debridement Level of Consciousness (Pre-procedure): Awake and Alert Pre-procedure Verification/Time Out Yes - 10:00 Taken: Start Time: 10:00 T Area Debrided (L x W): otal 2 (cm) x 2.4 (cm) = 4.8 (cm) Tissue and other material debrided: Viable, Non-Viable, Callus, Slough, Subcutaneous, Slough Level: Skin/Subcutaneous Tissue Debridement Description: Excisional Instrument: Curette Specimen: Tissue Culture Number of Specimens T aken: 1 Bleeding: Moderate Hemostasis Achieved: Pressure Response to Treatment: Procedure was tolerated well Level of Consciousness (Post- Awake and  Alert procedure): Kelly [name] ([id number]) [id number] Page 2 of 10 Post Debridement Measurements of Total Wound Length: (cm) 2 Width: (cm) 2.4 Depth: (cm) 0.3 Volume: (cm) 1.131 Character of Wound/Ulcer Post Debridement: Stable Post Procedure Diagnosis Same as Pre-procedure Electronic Signature(s) Signed: 03/02/2022 5:36:51 PM By: [name] Signed: 03/04/2022 11:57:56 AM By: Kelly [name] Signed: 03/05/2022 1:08:07 PM By: Gretta Cool, BSN, RN, CWS, Kim RN, BSN Entered By: Kelly [name] on 03/02/2022 10:05:09 -------------------------------------------------------------------------------- HPI Details Patient Name: Date of Service: Kelly [name], Kelly [name]. 03/02/2022 9:15 A [name]: [id number] Patient Account Number: [id number] Date of Birth/Sex: Treating RN: 03-Jun-1975 (47 y.o. Kelly [name] Primary Care Provider: Tommi [name] Other Clinician: Massie [name] Referring Provider: Treating Provider/Extender: Kelly [name] in Treatment: 27 History of Present Illness HPI Description: 47 year old patient sent to [name] by her PCP Kelly [name] who saw her recently on 05/15/2015 for chronic pain related to Charcot [name] and subsequent problems with her feet with a history of bilateral great toe amputations for osteomyelitis. Her notes were reviewed and there was extensive history of opioid treatment and management by the [facility] pain clinic from where she has been terminated. X-ray of the right [name] [name] was done on 03/13/2015 and was suggestive of active infection and possible osteomyelitis. her past surgical history includes amputation of the left big toe in 2011 and amputation of the right big toe in 2015. She was sitting the wound clinic at [facility] and we will try and obtain these notes. She is a smoker and smokes about 15 cigarettes a day. 06/12/2015 - x-ray of the right [name] -- IMPRESSION:1. Soft  tissue swelling of the plantar surface of the [name]. No underlying acute abnormality. 2. Amputation deformity again of the right great toe. Old healed bony deformity noted the right second metatarsal. No acute bony abnormality identified. We have not received any reports from the [facility] hospital yet. She continues to smoke at least 10 cigarettes a day 06/19/2015 --  she has increasing pain in the right [name] and also has had some low-grade fever. Her vascular test is scheduled for tomorrow and her MRIs not till next week. Readmission: 08-20-2021 upon evaluation today patient presents for initial inspection here in our clinic although she has been seen that was back in 2017. At that time she was having a issue with her right [name]. With that being said upon inspection today she is actually having issues with the right [name] as well on the medial aspect where she has some breakdown due to what I am assuming is pressure to the [name] location. 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 I did review her culture report which showed evidence of bacteria including hemolytic Streptococcus group A as well as Staphylococcus aureus. The patient is currently on doxycycline and tells me that this is getting much better. T opical gentamicin is also being utilized. Also did review her arterial studies which show that she has on the right and ABI of 1.15 which is triphasic and appears to be excellent there is no significant peripheral artery disease noted the left is also. With ABI of 1.11 and a good report in that regard as well. Lastly I did also review her MRI of the right forefoot which was performed on 07-06-2021. This shows that she does have mild subcortical marrow edema in the stump of the first metatarsal concerning for osteomyelitis. There is  also a malunited fracture of the second metatarsal. The patient has bone marrow edema in the base of the second third fourth and fifth metatarsals concerning for stress reaction obviously this infection is going to be an issue for [name] here as well she needs to be treated appropriately and I do believe that the doxycycline is a good option right now although I may need to #1 extend this and #2 plan for even altering or adding to the treatment regimen depending on how things progress. Patient voiced understanding.Marland Kitchen 09-01-2021 upon evaluation today patient appears to be doing excellent in regard to her wound. She is actually showing signs of excellent improvement which is great news. Fortunately I do not see any signs of active infection locally or systemically at this point which is great news and overall I am extremely pleased with where things stand today. I do not see any evidence of active infection locally or systemically. 09-07-2021 upon evaluation today patient appears to be doing well with regard to her [name] ulcer. This is actually showing signs of improvement which is great news. Fortunately there does not appear to be any signs of active infection locally or systemically at this time which is as well. KEILY, ERTZ ([id number]) [id number] Page 3 of 10 09-22-2021 upon evaluation today patient appears to be doing well currently in regard to her wound. This is measuring smaller and looking better were slowly making progress here. Fortunately there does not appear to be any evidence of active infection locally or systemically at this time. Upon inspection patient's wound bed showed evidence of good granulation and epithelization at this point. 10-05-2021 upon evaluation today patient's wound shows a lot of callus buildup but the surface of the wound is actually doing quite well. She is going require some sharp debridement to clearway some of the necrotic  debris. 10-19-2021 upon evaluation today patient appears to be doing better  in regard to her wound which is actually measuring little bit smaller today. Fortunately there does not appear to be any signs of significant infection she does have quite a bit of callus we will get have to work on that today. Otherwise I think we are headed in the appropriate direction. 11-09-2021 upon evaluation today patient appears to be doing well in regard to her wound from the standpoint of infection and actually appears to be doing better she actually just completed the doxycycline. With that being said I am hopeful that this took care of the osteomyelitis although that something that we will definitely keep a close eye on. With that being said I do believe that she is still having tremendous amount of callus buildup which I believe in turn is a result of her having issues here with Friction and callus buildup. With that being said I do believe that the patient would benefit here from a total contact cast I discussed that with her today. I do want to get more aggressive to get this closed as I think that if we do not then she is more likely to have a long standing issue here going forward. 11-17-2021 upon evaluation today patient appears to be doing well although her wound is still open. I do think that she is ready to be placed in a total contact cast. She actually has come back on Thursday to have this changed out. Fortunately and overall I do not see any signs of worsening and I think that we should be able to get this on without any complication or problem.. 11-19-2021 upon evaluation today patient appears to be doing well currently in regard to her wound. She has been tolerating the dressing changes without complication and overall I am extremely pleased with where we stand today. I do not see any evidence of active infection locally or systemically at this time which is great news. I think the total contact cast  did an awesome job for her. 11/9; right [name] in the setting of idiopathic peripheral neuropathy and Charcot deformity previous amputations. Only a very small area remains on the plantar [name]. I had to use illumination to even see anything that looked open here. Much improved 12-03-2021 upon evaluation today patient appears to be doing well currently in regard to her wound. Of note she tells me that she did end up cutting off her cast with a pair of kitchen shears 2 nights ago. She states that she has been walking on it more than she should have after the cast was put on due to a friend who ended up in the hospital that it caught her right after she had a put on that same day. She feels like she probably deformed the cast that it was causing some pressure points that she was afraid to get a blister and did not want another wound. For that reason she did go ahead and cut the soft and does not appear to have caused any damage. She tells me that it only took 7 minutes. With that being said I do think the wound actually looks really good she has a lot of callus buildup I am going to remove that just to make sure nothing side underneath but other than that overall she seems to be doing quite well. 12-07-2021 upon evaluation today patient's wound is actually showing signs of excellent improvement and very pleased with where we stand I think we are headed in the right direction. Fortunately there does  not appear to be any signs of active infection at this time which is great news. No fevers, chills, nausea, vomiting, or diarrhea. 01-14-2022 upon evaluation today patient appears to be doing well currently in regard to her wound she does have a bit of callus buildup unfortunately but otherwise does not seem to show any signs of active infection locally or systemically at this time. Fortunately I do not see any evidence of infection locally or systemically which is great news. She does have a lot of callus  buildup she has been very sick so has not been in to be seen recently. With that being said I do not think her wound is a whole lot worse than what it was before but it is also a lot more callused over so it is difficult to tell initially. 01-19-2022 upon evaluation today patient appears to be doing poorly unfortunately in regard to her wound. She has been tolerating the dressing changes without complication prior but unfortunately in the past week she has developed issues with redness and erythema on her [name] with a new wound on the top of her [name] which was not there last time I saw her. Unfortunately this seems to be getting significantly worse she is also had temperatures ranging up to 103 although it was 99.9 here in the office today it was not nearly that bad. Fortunately I do not see any signs obvious of sepsis but nonetheless I am concerned about the significance of this infection I think she does need to probably get into the ER ASAP in order to be evaluated and treated appropriately based on what we are seeing. The patient is very worried about the situation here. With that being said she tells me that she has been wearing even prior to coming in today. 01-26-2022 upon evaluation today patient actually appears to be doing significantly better despite the fact she did not go to the ER last week she tells me she had a lot going on with family and otherwise just was not able to make it. Fortunately there does not appear to be any signs of infection locally or systemically at this time. 02-16-2022 upon evaluation today patient appears to be doing well the wound on the top of her [name] is looking smaller the wound on the bottom/medial portion of her [name] is going require some debridement today to clearway some of the necrotic debris but overall looks to be doing quite well. I do not see any evidence of infection locally nor systemically at this time which is great news. 03-02-2022 upon evaluation today  patient appears to be doing poorly in regard to her [name]. It actually appears that she is probably infected based on what I am seeing and I think that she is going require some intervention in this regard. I think that we will probably put her back on doxycycline and also believe that she really needs an MRI repeated it has not been done since June antibiotics even then she showed some signs of osteomyelitis I feel like this may be getting worse. She still has not gone to the hospital as previously recommended although the infection did seem to clear with the doxycycline which is why I am going to go ahead and redo that she also states that she is not to be able to go to the hospital today. Electronic Signature(s) Signed: 03/02/2022 10:28:02 AM By: [name] Entered By: Worthy [name] on 03/02/2022 10:28:02 Physical Exam Details -------------------------------------------------------------------------------- Kelly [name],  Kelly [name] ([id number] Page 4 of 10 Patient Name: Date of Service: Kelly [name]. 03/02/2022 9:15 A [name]: [id number] Patient Account Number: [id number] Date of Birth/Sex: Treating RN: 04-24-1975 (48 y.o. Kelly [name] Primary Care Provider: Tommi [name] Other Clinician: Massie [name] Referring Provider: Treating Provider/Extender: Kelly [name] in Treatment: 27 Constitutional Well-nourished and well-hydrated in no acute distress. Respiratory normal breathing without difficulty. Psychiatric this patient is able to make decisions and demonstrates good insight into disease process. Alert and Oriented x 3. pleasant and cooperative. Notes Upon inspection patient's wound bed actually showed signs of again being callused over the top of the [name] is healed but unfortunately the bottom of her [name] is closed over but obviously seems to show signs of infection and cellulitis here. T  remove the callus upon doing so there was purulent drainage noted I cleaned this away including all the callus and once completely cleared out then took a o sample culture from the underneath region. This was the deepest part of the wound to see what bacteria may be causing the issue here. Obviously there is no direct bone exposure but she does have unfortunately significant cellulitis in regard to her [name] I really think she should go to the ER for further evaluation and treatment but she tells me that does not can be something she can do currently. Electronic Signature(s) Signed: 03/02/2022 10:28:54 AM By: [name] Entered By: Worthy [name] on 03/02/2022 10:28:54 -------------------------------------------------------------------------------- Physician Orders Details Patient Name: Date of Service: Kelly [name], Kelly [name]. 03/02/2022 9:15 A [name]: [id number] Patient Account Number: [id number] Date of Birth/Sex: Treating RN: 02/23/1975 (47 y.o. 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 Bathing/ Shower/ Hygiene May shower; gently cleanse wound with antibacterial soap, rinse and pat dry prior to dressing wounds No tub bath. Anesthetic (Use 'Patient Medications' Section for Anesthetic Order Entry) Lidocaine applied to wound bed Edema Control - Lymphedema / Segmental Compressive Device / Other LENNA, MUSACCHIA ([id number] Page 5 of 10 Elevate, Exercise Daily and A void Standing for Long Periods of Time. Elevate legs to the level of the heart and pump ankles as often as possible Elevate leg(s) parallel to the floor when sitting. Medications-Please add to medication list. Wound #2 Plantar [name] Take one 526m Tylenol (A cetaminophen) and one 2084mMotrin (Ibuprofen) every 6 hours for pain. Do not take ibuprofen if you are on blood thinners or have stomach ulcers. ntibiotics - start Doxycycline P.O. A Wound Treatment Wound #2 - [name] Wound Laterality: Plantar Cleanser: Byram Ancillary Kit - 15 Day Supply (Generic) 3 x Per Week/30 Days Discharge Instructions: Use supplies as instructed; Kit contains: (15) Saline Bullets; (15) 3x3 Gauze; 15 pr Gloves Cleanser: Soap and Water 3 x Per Week/30 Days Discharge Instructions: Gently cleanse wound with antibacterial soap, rinse and pat dry prior to dressing wounds Prim Dressing: Hydrofera Blue Ready Transfer Foam, 2.5x2.5 (in/in) (DME) (Dispense As Written) 3 x Per Week/30 Days ary Discharge Instructions: Apply Hydrofera Blue Ready to wound  bed as directed Secondary Dressing: (BORDER) [place] Dressing 4x4 (in/in) (DME) (Dispense As Written) 3 x Per Week/30 Days Discharge Instructions: Please do not put silicone bordered dressings under wraps. Use non-bordered dressing only. Laboratory Bacteria identified in Wound by Culture (MICRO) - culture obtained right [name] LOINC Code: [id number] Convenience Name: Wound culture routine Radiology MRI with and without Contrast - MRI ordered right [name] Patient Medications llergies: penicillin, Bactrim, Sulfa (Sulfonamide Antibiotics), tramadol HCl, clindamycin, bee venom protein (honey bee) A Notifications Medication Indication Start End 03/02/2022 doxycycline hyclate DOSE 1 - oral 100 mg capsule - 1 capsule oral twice a day x 30 days Electronic Signature(s) Signed:  03/02/2022 10:32:54 AM By: [name] Entered By: Worthy [name] on 03/02/2022 10:32:54 -------------------------------------------------------------------------------- Problem List Details Patient Name: Date of Service: Kelly [name], Kelly [name]. 03/02/2022 9:15 A [name]: [id number] Patient Account Number: [id number] Date of Birth/Sex: Treating RN: 27-Aug-1975 (47 y.o. Kelly [name] Primary Care Provider: Tommi [name] Other Clinician: Massie [name] Referring Provider: Treating Provider/Extender: Kelly [name] in Treatment: [address] Active Problems ICD-10 KASEE, MASUDA Jerilynn Mages ([id number]) [id number] Page 6 of 10 Encounter Code Description Active Date MDM Diagnosis M86.671 Other chronic osteomyelitis, right ankle and [name] 08/20/2021 No Yes L97.512 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 08/20/2021 No Yes G71.00 Muscular dystrophy, unspecified 08/20/2021 No Yes 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 03/02/2022 9:15 A [name]: [id number] Patient Account Number: [id number] Date of Birth/Sex: Treating RN: 11/16/1975 (47 y.o. Kelly [name] Primary Care Provider: Tommi [name] Other Clinician: Massie [name] Referring Provider: Treating Provider/Extender: Kelly [name] in Treatment: 27 Subjective Chief Complaint Information obtained from Patient Right [name] ulcer History of Present Illness (HPI) 47 year old patient sent to [name] by her PCP Kelly [name] who saw her  recently on 05/15/2015 for chronic pain related to Charcot [name] and subsequent problems with her feet with a history of bilateral great toe amputations for osteomyelitis. Her notes were reviewed and there was extensive history of opioid treatment and management by the [facility] pain clinic from where she has been terminated. X-ray of the right [name] [name] was done on 03/13/2015 and was suggestive of active infection and possible osteomyelitis. her past surgical history includes amputation of the left big toe in 2011 and amputation of the right big toe in 2015. She was sitting the wound clinic at [facility] and we will try and obtain these notes. She is a smoker and smokes about 15 cigarettes a day. 06/12/2015 - x-ray of the right [name] -- IMPRESSION:1. Soft tissue swelling of the plantar surface of the [name]. No underlying acute abnormality. 2. Amputation deformity again of the right great toe. Old healed bony deformity noted the right second metatarsal. No acute bony abnormality identified. We have not received any reports from the [facility] hospital yet. She continues to smoke at least 10 cigarettes a day 06/19/2015 -- she has increasing pain in the right [name] and also has had some low-grade fever. Her vascular test is scheduled for tomorrow and her MRIs not till next week. Readmission: SHAILYNN, BERNINGER ([id number]) [id number].pdf Page 7 of 10  08-20-2021 upon evaluation today patient presents for initial inspection here in our clinic although she has been seen that was back in 2017. At that time she was having a issue with her right [name]. With that being said upon inspection today she is actually having issues with the right [name] as well on the medial aspect where she has some breakdown due to what I am assuming is pressure to the [name] location. 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 I did review her culture report which showed evidence of bacteria including hemolytic Streptococcus group A as well as Staphylococcus aureus. The patient is currently on doxycycline and tells me that this is getting much better. T opical gentamicin is also being utilized. Also did review her arterial studies which show that she has on the right and ABI of 1.15 which is triphasic and appears to be excellent there is no significant peripheral artery disease noted the left is also. With ABI of 1.11 and a good report in that regard as well. Lastly I did also review her MRI of the right forefoot which was performed on 07-06-2021. This shows that she does have mild subcortical marrow edema in the stump of the first metatarsal concerning for osteomyelitis. There is also a malunited fracture of the second metatarsal. The patient has bone marrow edema in the base of the second third fourth and fifth metatarsals concerning for stress reaction obviously this infection is going to be an issue for [name] here as well she needs to be treated appropriately and I do believe that the doxycycline is a good option right now although I may need to #1 extend this and #2 plan for even altering or adding to the treatment regimen depending on how things progress. Patient voiced understanding.Marland Kitchen 09-01-2021 upon evaluation today patient appears to be doing excellent in regard to her wound. She is actually showing signs of excellent improvement which is great news. Fortunately I do not see any signs of active infection locally or systemically at this point which is great news and overall I am extremely pleased with where things stand today. I do not see any evidence of active infection locally or systemically. 09-07-2021 upon evaluation today patient appears to be doing well with regard to her [name] ulcer. This is actually showing signs of  improvement which is great news. Fortunately there does not appear to be any signs of active infection locally or systemically at this time which is as well. 09-22-2021 upon evaluation today patient appears to be doing well currently in regard to her wound. This is measuring smaller and looking better were slowly making progress here. Fortunately there does not appear to be any evidence of active infection locally or systemically at this time. Upon inspection patient's wound bed showed evidence of good granulation and epithelization at this point. 10-05-2021 upon evaluation today patient's wound shows a lot of callus buildup but the surface of the wound is actually doing quite well. She is going require some sharp debridement to clearway some of the necrotic debris. 10-19-2021 upon evaluation today patient appears to be doing better in regard to her wound which is actually measuring little bit smaller today. Fortunately there does not appear to be any signs of significant infection she does have quite a bit of callus we will get have to work  on that today. Otherwise I think we are headed in the appropriate direction. 11-09-2021 upon evaluation today patient appears to be doing well in regard to her wound from the standpoint of infection and actually appears to be doing better she actually just completed the doxycycline. With that being said I am hopeful that this took care of the osteomyelitis although that something that we will definitely keep a close eye on. With that being said I do believe that she is still having tremendous amount of callus buildup which I believe in turn is a result of her having issues here with Friction and callus buildup. With that being said I do believe that the patient would benefit here from a total contact cast I discussed that with her today. I do want to get more aggressive to get this closed as I think that if we do not then she is more likely to have a long  standing issue here going forward. 11-17-2021 upon evaluation today patient appears to be doing well although her wound is still open. I do think that she is ready to be placed in a total contact cast. She actually has come back on Thursday to have this changed out. Fortunately and overall I do not see any signs of worsening and I think that we should be able to get this on without any complication or problem.. 11-19-2021 upon evaluation today patient appears to be doing well currently in regard to her wound. She has been tolerating the dressing changes without complication and overall I am extremely pleased with where we stand today. I do not see any evidence of active infection locally or systemically at this time which is great news. I think the total contact cast did an awesome job for her. 11/9; right [name] in the setting of idiopathic peripheral neuropathy and Charcot deformity previous amputations. Only a very small area remains on the plantar [name]. I had to use illumination to even see anything that looked open here. Much improved 12-03-2021 upon evaluation today patient appears to be doing well currently in regard to her wound. Of note she tells me that she did end up cutting off her cast with a pair of kitchen shears 2 nights ago. She states that she has been walking on it more than she should have after the cast was put on due to a friend who ended up in the hospital that it caught her right after she had a put on that same day. She feels like she probably deformed the cast that it was causing some pressure points that she was afraid to get a blister and did not want another wound. For that reason she did go ahead and cut the soft and does not appear to have caused any damage. She tells me that it only took 7 minutes. With that being said I do think the wound actually looks really good she has a lot of callus buildup I am going to remove that just to make sure nothing side underneath but  other than that overall she seems to be doing quite well. 12-07-2021 upon evaluation today patient's wound is actually showing signs of excellent improvement and very pleased with where we stand I think we are headed in the right direction. Fortunately there does not appear to be any signs of active infection at this time which is great news. No fevers, chills, nausea, vomiting, or diarrhea. 01-14-2022 upon evaluation today patient appears to be doing well currently in regard to her  wound she does have a bit of callus buildup unfortunately but otherwise does not seem to show any signs of active infection locally or systemically at this time. Fortunately I do not see any evidence of infection locally or systemically which is great news. She does have a lot of callus buildup she has been very sick so has not been in to be seen recently. With that being said I do not think her wound is a whole lot worse than what it was before but it is also a lot more callused over so it is difficult to tell initially. 01-19-2022 upon evaluation today patient appears to be doing poorly unfortunately in regard to her wound. She has been tolerating the dressing changes without complication prior but unfortunately in the past week she has developed issues with redness and erythema on her [name] with a new wound on the top of her [name] which was not there last time I saw her. Unfortunately this seems to be getting significantly worse she is also had temperatures ranging up to 103 although it was 99.9 here in the office today it was not nearly that bad. Fortunately I do not see any signs obvious of sepsis but nonetheless I am concerned about the significance of this infection I think she does need to probably get into the ER ASAP in order to be evaluated and treated appropriately based on what we are seeing. The patient is very worried about the situation here. With that being said she tells me that she has been wearing even prior  to coming in today. 01-26-2022 upon evaluation today patient actually appears to be doing significantly better despite the fact she did not go to the ER last week she tells me she had a lot going on with family and otherwise just was not able to make it. Fortunately there does not appear to be any signs of infection locally or systemically at this time. 02-16-2022 upon evaluation today patient appears to be doing well the wound on the top of her [name] is looking smaller the wound on the bottom/medial portion of her [name] is going require some debridement today to clearway some of the necrotic debris but overall looks to be doing quite well. I do not see any evidence of infection locally nor systemically at this time which is great news. 03-02-2022 upon evaluation today patient appears to be doing poorly in regard to her [name]. It actually appears that she is probably infected based on what I am seeing and I think that she is going require some intervention in this regard. I think that we will probably put her back on doxycycline and also believe that she really needs an MRI repeated it has not been done since June antibiotics even then she showed some signs of osteomyelitis I feel like this may be getting worse. She still has not gone to the hospital as previously recommended although the infection did seem to clear with the doxycycline which is why I am going to go ahead and redo that she also states that she is not to be able to go to the hospital today. MARCELLE, DURAND ([id number]) [id number] Page 8 of 10 Objective Constitutional Well-nourished and well-hydrated in no acute distress. Vitals Time Taken: 9:29 AM, Height: 67 in, Weight: 153 lbs, BMI: 24, Temperature: 99.0 F, Pulse: 94 bpm, Respiratory Rate: 16 breaths/min, Blood Pressure: 101/67 mmHg. Respiratory normal breathing without difficulty. Psychiatric this patient is able to make decisions and demonstrates good  insight  into disease process. Alert and Oriented x 3. pleasant and cooperative. General Notes: Upon inspection patient's wound bed actually showed signs of again being callused over the top of the [name] is healed but unfortunately the bottom of her [name] is closed over but obviously seems to show signs of infection and cellulitis here. T remove the callus upon doing so there was purulent o drainage noted I cleaned this away including all the callus and once completely cleared out then took a sample culture from the underneath region. This was the deepest part of the wound to see what bacteria may be causing the issue here. Obviously there is no direct bone exposure but she does have unfortunately significant cellulitis in regard to her [name] I really think she should go to the ER for further evaluation and treatment but she tells me that does not can be something she can do currently. Integumentary (Hair, Skin) Wound #2 status is Open. Original cause of wound was Trauma. The date acquired was: 06/18/2021. The wound has been in treatment 27 weeks. The wound is located on the Plantar [name]. The wound measures 0.1cm length x 0.1cm width x 0.1cm depth; 0.008cm^2 area and 0.001cm^3 volume. There is Fat Layer (Subcutaneous Tissue) exposed. There is a medium amount of serosanguineous drainage noted. There is medium (34-66%) pink granulation within the wound bed. There is a medium (34-66%) amount of necrotic tissue within the wound bed. Wound #3 status is Healed - Epithelialized. Original cause of wound was Blister. The date acquired was: 01/13/2022. The wound has been in treatment 6 weeks. The wound is located on the Dorsal [name]. The wound measures 0cm length x 0cm width x 0cm depth; 0cm^2 area and 0cm^3 volume. There is Fat Layer (Subcutaneous Tissue) exposed. There is no tunneling or undermining noted. There is a none present amount of drainage noted. The wound margin is flat and intact. There is no granulation  within the wound bed. There is a small (1-33%) amount of necrotic tissue within the wound bed. 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 There was a Excisional Skin/Subcutaneous Tissue Debridement with a total area of 4.8 sq cm performed by Kelly [name]., [name]. With the following instrument(s): Curette to remove Viable and Non-Viable tissue/material. Material removed includes Callus, Subcutaneous Tissue, and Slough. 1 specimen was taken by a Tissue Culture and sent to the lab per facility protocol. A time out was conducted at 10:00, prior to the start of the procedure. A Moderate amount of bleeding was controlled with Pressure. The procedure was tolerated well. Post Debridement Measurements: 2cm length x 2.4cm width x 0.3cm depth; 1.131cm^3 volume. Character of Wound/Ulcer Post Debridement is stable. Post procedure Diagnosis Wound #2: Same as Pre-Procedure Plan Follow-up Appointments: Return Appointment in 1 week. Bathing/ Shower/ Hygiene: May shower; gently cleanse wound with antibacterial soap, rinse and pat dry prior to dressing wounds No tub bath. Anesthetic (Use 'Patient Medications' Section for Anesthetic Order Entry): Lidocaine applied to wound bed Edema Control - Lymphedema / Segmental Compressive Device / Other: Elevate, Exercise Daily and Avoid Standing for Long Periods of Time. Elevate legs to the level of the heart and pump ankles as often as possible Elevate leg(s) parallel to the floor when sitting. Medications-Please add  to medication list.: Wound #2 Plantar [name]: Kelly [name], Kelly [name] ([id number] Page 9 of 10 T one 578m  Tylenol (Acetaminophen) and one 2039mMotrin (Ibuprofen) every 6 hours for pain. Do not take ibuprofen if you are on blood thinners or ake have stomach ulcers. P.O. Antibiotics - start Doxycycline Laboratory ordered were: Wound culture routine - culture obtained right [name] Radiology ordered were: MRI with and without Contrast - MRI ordered right [name] The following medication(s) was prescribed: doxycycline hyclate oral 100 mg capsule 1 1 capsule oral twice a day x 30 days starting 03/02/2022 WOUND #2: - [name] Wound Laterality: Plantar Cleanser: Byram Ancillary Kit - 15 Day Supply (Generic) 3 x Per Week/30 Days Discharge Instructions: Use supplies as instructed; Kit contains: (15) Saline Bullets; (15) 3x3 Gauze; 15 pr Gloves Cleanser: Soap and Water 3 x Per Week/30 Days Discharge Instructions: Gently cleanse wound with antibacterial soap, rinse and pat dry prior to dressing wounds Prim Dressing: Hydrofera Blue Ready Transfer Foam, 2.5x2.5 (in/in) (DME) (Dispense As Written) 3 x Per Week/30 Days ary Discharge Instructions: Apply Hydrofera Blue Ready to wound bed as directed Secondary Dressing: (BORDER) Zetuvit Plus SILICONE BORDER Dressing 4x4 (in/in) (DME) (Dispense As Written) 3 x Per Week/30 Days Discharge Instructions: Please do not put silicone bordered dressings under wraps. Use non-bordered dressing only. 1. Based on what I am seeing I think that the patient really should go to the ER for further evaluation and treatment. I discussed this with her and we had a long conversation about it but in the end she does not want to proceed with that and therefore we will give her doxycycline but I gave her instructions that if she develops any fevers, chills, nausea, vomiting, or diarrhea she is to go as soon as possible and if anyone around her notices her to be confused she is to go to the hospital. Also advised her that by fever anything above 100.0. 2. I am good recommend as well that we continue  [facility] by the bordered foam dressing. 3 also recommend patient should continue to monitor for any signs of redness creeping up her leg and further from in regard to the [name] area if this happens she should also go to the hospital. 4. Such as not going to the ER today I am can go ahead and order an MRI along with a wound culture. We will see patient back for reevaluation in 1 week here in the clinic. If anything worsens or changes patient will contact our office for additional recommendations. Again my opinion however is that she should go to the hospital that is the ER today for treatment ASAP. Electronic Signature(s) Signed: 03/02/2022 10:33:40 AM By: StWorthy KeelerA-C Previous Signature: 03/02/2022 10:30:09 AM Version By: StWorthy KeelerA-C Entered By: StWorthy Keelern 03/02/2022 10:33:40 -------------------------------------------------------------------------------- SuperBill Details Patient Name: Date of Service: NETheodoro ClockMAHarrison Mons2/13/2024 Medical Record Number: [id number] Account Number: [id number] [id number] Birth/Sex: Treating RN: [id number]. 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 G71.00 Muscular dystrophy, unspecified G60.0 Hereditary motor and sensory neuropathy L84 Corns and callosities Facility Procedures : NELEIDY, BODINode: [id number] [name] ([id number]: 11042 - DEB SUBQ TISSUE  20 SQ CM/< ICD-10 Diagnosis Description L97.512 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 ) [id number] Modifier: [id number] Page Quantity: 1 10 of  10 Physician Procedures : CPT4 Code Description Modifier [id number] - WC PHYS LEVEL 4 - EST PT 25 ICD-10 Diagnosis Description M86.671 Other chronic osteomyelitis, right ankle and [name] L97.512 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

## 2022-03-02 NOTE — Progress Notes (Addendum)
MARLOW, BOWE ([id number]) [id number] Page 1 of [name] Visit Report for 03/02/2022 Arrival Information [name] Patient Name: Date of Service: [name]. 03/02/2022 9:15 A [name]: [id number] Patient Account Number: [id number] Date of Birth/Sex: Treating RN: [birth year], [name] (47 y.o. [name] Chidubem Chaires: [name] Other Clinician: [name] Referring Heydi Swango: Treating Laketra Bowdish/Extender: [name] in Treatment: 27 Visit Information History Since Last Visit All ordered tests and consults were completed: No Patient Arrived: Ambulatory Added or deleted any medications: No Arrival Time: [name]:28 Any new allergies or adverse reactions: No Transfer Assistance: None Had a fall or experienced change in No Patient Identification Verified: Yes activities of daily living that may affect Secondary Verification Process Completed: Yes risk of falls: Patient Requires Transmission-Based Precautions: No Signs or symptoms of abuse/neglect since No Patient Has Alerts: Yes last visito Patient Alerts: ABI R 1.15 02/04/21 Hospitalized since last visit: No ABI L 1.11 02/04/21 Implantable device outside of the clinic No excluding cellular tissue based products placed in the center since last visit: Has Dressing in Place as Prescribed: Yes Has Footwear/Offloading in Place as Yes Prescribed: Left: Surgical Shoe with Pressure Relief Insole Pain Present Now: Yes Electronic Signature(s) Signed: 03/04/2022 11:57:56 AM By: [name] Entered By: [name] on 03/02/2022 [name]:28:29 -------------------------------------------------------------------------------- Clinic Level of [name] Assessment [name] Patient Name: Date of Service: [name]. 03/02/2022 9:15 A [name]: [id number] Patient Account Number: [id number] Date of Birth/Sex: Treating RN: [birth year][name] (47 y.o. [name] Delos Klich: [name] Other Clinician: [name] Referring Chancelor Hardrick: Treating Cali Hope/Extender: [name] in Treatment: [facility] Level of [name] Assessment Items TOOL 1 Quantity Score ZAYNE, PICO ([id number]) [id number] Page 2 of [name] []$  - 0 Use when EandM and Procedure is performed on INITIAL visit ASSESSMENTS - Nursing Assessment / Reassessment []$  - 0 General Physical Exam (combine w/ comprehensive assessment (listed just below) when performed on new pt. evals) []$  - 0 Comprehensive Assessment (HX, ROS, Risk Assessments, Wounds Hx, etc.) ASSESSMENTS - Wound and Skin Assessment / Reassessment []$  - 0 Dermatologic / Skin Assessment (not related to wound area) ASSESSMENTS - Ostomy and/or Continence Assessment and [name] []$  - 0 Incontinence Assessment and Management []$  - 0 Ostomy [name] Assessment and Management (repouching, etc.) PROCESS - Coordination of [name] []$  - 0 Simple Patient / Family Education for ongoing [name] []$  - 0 Complex (extensive) Patient / Family Education for ongoing [name] []$  - 0 Staff obtains Programmer, systems, Records, T Results / Process Orders est []$  - 0 Staff telephones HHA, Nursing Homes / Clarify orders / etc []$  - 0 Routine Transfer to another Facility (non-emergent condition) []$  - 0 Routine Hospital Admission (non-emergent condition) []$  - 0 New Admissions / Biomedical engineer / Ordering NPWT Apligraf, etc. , []$  - 0 Emergency Hospital Admission (emergent condition) PROCESS - Special Needs []$  - 0 Pediatric / Minor Patient Management []$  - 0 Isolation Patient Management []$  - 0 Hearing / Language / Visual special needs []$  - 0 Assessment of Community assistance (transportation, D/C planning, etc.) []$  - 0 Additional assistance / Altered mentation []$  - 0 Support Surface(s) Assessment (bed, cushion, seat, etc.) INTERVENTIONS - Miscellaneous []$  - 0 External ear exam []$  -  0 Patient Transfer (multiple staff / Civil Service fast streamer / Similar devices) []$  - 0 Simple Staple / Suture removal (25 or less) []$  - 0 Complex Staple / Suture removal (26 or more) []$  -  0 Hypo/Hyperglycemic Management (do not check if billed separately) []$  - 0 Ankle / Brachial Index (ABI) - do not check if billed separately Has the patient been seen at the hospital within the last three years: Yes Total Score: 0 Level Of [name]: ____ Electronic Signature(s) Signed: 03/04/2022 11:57:56 AM By: [name] Entered By: [name] on 03/02/2022 [name]:18:40 Encounter Discharge Information [name] -------------------------------------------------------------------------------- [name] ([id number] Page 3 of [name] Patient Name: Date of Service: [name]. 03/02/2022 9:15 A [name]: [id number] Patient Account Number: [id number] Date of Birth/Sex: Treating RN: [birth year] (47 y.o. [name] Zamiyah Resendes: [name] Other Clinician: [name] Referring Adriane Gabbert: Treating Darren Nodal/Extender: [name] in Treatment: 27 Encounter Discharge Information Items Post Procedure Vitals Discharge Condition: Stable Temperature (F): 99.0 Ambulatory Status: Ambulatory Pulse (bpm): 94 Discharge Destination: Home Respiratory Rate (breaths/min): 16 Transportation: Private Auto Blood Pressure (mmHg): 101/67 Accompanied By: self Schedule Follow-up Appointment: Yes Clinical Summary of [name]: Electronic Signature(s) Signed: 03/04/2022 11:57:56 AM By: [name] Entered By: [name] on 03/02/2022 13:04:29 -------------------------------------------------------------------------------- Lower Extremity Assessment [name] Patient Name: Date of Service: [name]. 03/02/2022 9:15 A [name]: [id number] Patient Account Number: [id number] Date of Birth/Sex: Treating  RN: [birth year] (47 y.o. [name] Chaunte Hornbeck: [name] Other Clinician: [name] Referring Darcy Cordner: Treating Karlene Southard/Extender: [name] in Treatment: 27 Vascular Assessment Pulses: Dorsalis Pedis Palpable: [Right:Yes] Electronic Signature(s) Signed: 03/04/2022 11:57:56 AM By: [name] Signed: 03/05/2022 1:08:07 PM By: Gretta Cool, BSN, RN, CWS, Kim RN, BSN Entered By: [name] on 03/02/2022 [name]:36:44 -------------------------------------------------------------------------------- Multi Wound Chart [name] Patient Name: Date of Service: [name], Kelly Mons. 03/02/2022 9:15 A [name]: [id number] Patient Account Number: [id number] Date of Birth/Sex: Treating RN: [birth year][name] (47 y.o. [name] Bassheva Flury: [name] Other Clinician: [name] [facility], [place] Jerilynn Mages ([id number]) [id number] Page 4 of [name] Referring Dionysios Massman: Treating Laban Orourke/Extender: [name] in Treatment: 27 Vital Signs Height(in): 67 Pulse(bpm): 94 Weight(lbs): 153 Blood Pressure(mmHg): 101/67 Body Mass Index(BMI): 24 Temperature(F): 99.0 Respiratory Rate(breaths/min): 16 [2:Photos:] [N/A:N/A] Plantar Foot Dorsal Foot N/A Wound Location: Trauma Blister N/A Wounding Event: Abscess Abrasion N/A Primary Etiology: Anemia, Asthma, History of pressure Anemia, Asthma, History of pressure N/A Comorbid History: wounds, Osteomyelitis, Neuropathy wounds, Osteomyelitis, Neuropathy 06/18/2021 01/13/2022 N/A Date Acquired: 27 6 N/A Weeks of Treatment: Open Open N/A Wound Status: No No N/A Wound Recurrence: 0.1x0.1x0.1 0.1x0.1x0.1 N/A Measurements L x W x D (cm) 0.008 0.008 N/A A (cm) : rea 0.001 0.001 N/A Volume (cm) : 98.90% 100.00% N/A % Reduction in Area: 99.60% 100.00% N/A % Reduction in Volume: Full Thickness Without Exposed Full Thickness Without  Exposed N/A Classification: Support Structures Support Structures Medium Medium N/A Exudate Amount: Serosanguineous Serosanguineous N/A Exudate Type: red, brown red, brown N/A Exudate Color: N/A Flat and Intact N/A Wound Margin: Medium (34-66%) Large (67-100%) N/A Granulation Amount: Pink Red N/A Granulation Quality: Medium (34-66%) Small (1-33%) N/A Necrotic Amount: Fat Layer (Subcutaneous Tissue): Yes Fat Layer (Subcutaneous Tissue): Yes N/A Exposed Structures: Fascia: No Fascia: No Tendon: No Tendon: No Muscle: No Muscle: No Joint: No Joint: No Bone: No Bone: No Medium (34-66%) None N/A Epithelialization: Treatment Notes Electronic Signature(s) Signed: 03/04/2022 11:57:56 AM By: [name] Entered By: [name] on 03/02/2022 [name]:36:48 -------------------------------------------------------------------------------- Multi-Disciplinary [name] Plan [name] Patient Name: Date of Service: Kelly Clock, Kelly TTIE [name]. 03/02/2022 9:15 A  [name]: [id number] Patient Account Number: [id number] Date of Birth/Sex: Treating RN: [birth year] (47 y.o. [name] [place], [place] Jerilynn Mages ([id number]) [id number] Page 5 of [name] Primary [name] Anntonette Madewell: [name] Other Clinician: [name] Referring Jimmylee Ratterree: Treating Lamont Glasscock/Extender: [name] in Treatment: 27 Active Inactive Wound/Skin Impairment Nursing Diagnoses: Knowledge deficit related to ulceration/compromised skin integrity Goals: Patient/caregiver will verbalize understanding of skin [name] regimen Date Initiated: 08/20/2021 Target Resolution Date: 02/20/2022 Goal Status: Active Ulcer/skin breakdown will have a volume reduction of 30% by week 4 Date Initiated: 08/20/2021 Date Inactivated: 01/26/2022 Target Resolution Date: 09/20/2021 Goal Status: Unmet Unmet Reason: COMORBIDITIES Ulcer/skin breakdown will have a volume reduction of 50% by week 8 Date  Initiated: 08/20/2021 Date Inactivated: 01/26/2022 Target Resolution Date: [name]/03/2021 Goal Status: Unmet Unmet Reason: COMORBIDITIES Ulcer/skin breakdown will have a volume reduction of 80% by week 12 Date Initiated: 08/20/2021 Date Inactivated: 01/26/2022 Target Resolution Date: 11/20/2021 Goal Status: Unmet Unmet Reason: COMORBIDITIES Ulcer/skin breakdown will heal within 14 weeks Date Initiated: 08/20/2021 Date Inactivated: 01/26/2022 Target Resolution Date: 12/20/2021 Goal Status: Unmet Unmet Reason: COMORBIDITIES Interventions: Assess patient/caregiver ability to obtain necessary supplies Assess patient/caregiver ability to perform ulcer/skin [name] regimen upon admission and as needed Assess ulceration(s) every visit Notes: Electronic Signature(s) Signed: 03/04/2022 11:57:56 AM By: [name] Signed: 03/05/2022 1:08:07 PM By: Gretta Cool, BSN, RN, CWS, Kim RN, BSN Entered By: [name] on 03/02/2022 13:02:43 -------------------------------------------------------------------------------- Pain Assessment [name] Patient Name: Date of Service: [name], Kelly Mons. 03/02/2022 9:15 A [name]: [id number] Patient Account Number: [id number] Date of Birth/Sex: Treating RN: [name], [name] (47 y.o. [name] Lisandra Mathisen: [name] Other Clinician: [name] Referring Adeli Frost: Treating Yamin Swingler/Extender: [name] in Treatment: 27 Active Problems Location of Pain Severity and Description of Pain Patient Has Paino Yes Site Locations Pain Location: [name], [name] ([id number]) [id number] Page 6 of [name] Pain Location: Pain in Ulcers Duration of the Pain. Constant / Intermittento Constant Rate the pain. Current Pain Level: 7 Character of Pain Describe the Pain: Shooting, Throbbing Pain Management and Medication Current Pain Management: Medication: Yes Cold Application: No Rest: Yes Massage:  No Activity: No T.E.N.S.: No Heat Application: No Leg drop or elevation: No Is the Current Pain Management Adequate: Inadequate How does your wound impact your activities of daily livingo Sleep: No Bathing: No Appetite: No Relationship With Others: No Bladder Continence: No Emotions: No Bowel Continence: No Work: No Toileting: No Drive: No Dressing: No Hobbies: No Electronic Signature(s) Signed: 03/04/2022 11:57:56 AM By: [name] Signed: 03/05/2022 1:08:07 PM By: Gretta Cool, BSN, RN, CWS, Kim RN, BSN Entered By: [name] on 03/02/2022 [name]:31:15 -------------------------------------------------------------------------------- Patient/Caregiver Education [name] Patient Name: Date of Service: [name] 2/13/2024andnbsp9:15 A [name]: [id number] Patient Account Number: [id number] Date of Birth/Gender: Treating RN: [birth year] (47 y.o. [name] Physician: [name] Other Clinician: [name] Referring Physician: Treating Physician/Extender: [name] in Treatment: 27 Education Assessment Education Provided To: Patient Education Topics Provided Wound/Skin Impairment: Handouts: Other: continue wound [name] as directed [name], [name] ([id number]) [id number] Page 7 of [name] Methods: Explain/Verbal Responses: State content correctly Electronic Signature(s) Signed: 03/04/2022 11:57:56 AM By: [name] Entered By: [name] on 03/02/2022 13:02:39 -------------------------------------------------------------------------------- Wound Assessment [name] Patient Name: Date of Service: [name], Kelly Mons. 03/02/2022 9:15 A [name]: [id number] Patient Account Number: [id number] Date of Birth/Sex: Treating RN: [birth year] (47 y.o. F)  [name] Primary [name] Gevon Markus: [name] Other Clinician: [name] Referring Khilynn Borntreger: Treating  [name]/Extender: [name] in Treatment: 27 Wound Status Wound Number: 2 Primary Abscess Etiology: Wound Location: Plantar Foot Wound Status: Open Wounding Event: Trauma Comorbid Anemia, Asthma, History of pressure wounds, Osteomyelitis, Date Acquired: 06/18/2021 History: Neuropathy Weeks Of Treatment: 27 Clustered Wound: No Photos Wound Measurements Length: (cm) 0.1 Width: (cm) 0.1 Depth: (cm) 0.1 Area: (cm) 0.008 Volume: (cm) 0.001 % Reduction in Area: 98.9% % Reduction in Volume: 99.6% Epithelialization: Medium (34-66%) Wound Description Classification: Full Thickness Without Exposed Suppor Exudate Amount: Medium Exudate Type: Serosanguineous Exudate Color: red, brown t Structures Foul Odor After Cleansing: No Slough/Fibrino No Wound Bed Granulation Amount: Medium (34-66%) Exposed Structure Granulation Quality: Pink Fascia Exposed: No Necrotic Amount: Medium (34-66%) Fat Layer (Subcutaneous Tissue) Exposed: Yes Tendon Exposed: No Muscle Exposed: No Joint Exposed: No [name], [name] ([id number] Page 8 of [name] Bone Exposed: No Treatment Notes Wound #2 (Foot) Wound Laterality: Plantar Cleanser Byram Ancillary Kit - 15 Day Supply Discharge Instruction: Use supplies as instructed; Kit contains: (15) Saline Bullets; (15) 3x3 Gauze; 15 pr Gloves Soap and Water Discharge Instruction: Gently cleanse wound with antibacterial soap, rinse and pat dry prior to dressing wounds Peri-Wound [name] Topical Primary Dressing Hydrofera Blue Ready Transfer Foam, 2.5x2.5 (in/in) Discharge Instruction: Apply Hydrofera Blue Ready to wound bed as directed Secondary Dressing (BORDER) Zetuvit Plus SILICONE BORDER Dressing 4x4 (in/in) Discharge Instruction: Please do not put silicone bordered dressings under wraps. Use non-bordered dressing only. Secured With Compression Wrap Compression Stockings Sport and exercise psychologist) Signed: 03/04/2022 11:57:56 AM By: [name] Signed: 03/05/2022 1:08:07 PM By: Gretta Cool, BSN, RN, CWS, Kim RN, BSN Entered By: [name] on 03/02/2022 [name]:08:02 -------------------------------------------------------------------------------- Wound Assessment [name] Patient Name: Date of Service: [name], Kelly Mons. 03/02/2022 9:15 A [name]: [id number] Patient Account Number: [id number] Date of Birth/Sex: Treating RN: August [birth year], [name] (47 y.o. [name] Ajax Schroll: [name] Other Clinician: [name] Referring Kinnie Kaupp: Treating [place] Anagnos/Extender: [name] in Treatment: 27 Wound Status Wound Number: 3 Primary Abrasion Etiology: Wound Location: Dorsal Foot Wound Status: Healed - Epithelialized Wounding Event: Blister Comorbid Anemia, Asthma, History of pressure wounds, Osteomyelitis, Date Acquired: 01/13/2022 History: Neuropathy Weeks Of Treatment: 6 Clustered Wound: No Photos [name], [name] ([id number] Page 9 of [name] Wound Measurements Length: (cm) Width: (cm) Depth: (cm) Area: (cm) Volume: (cm) 0 % Reduction in Area: 100% 0 % Reduction in Volume: 100% 0 Epithelialization: None 0 Tunneling: No 0 Undermining: No Wound Description Classification: Full Thickness Without Exposed Support Wound Margin: Flat and Intact Exudate Amount: None Present Structures Foul Odor After Cleansing: No Slough/Fibrino No Wound Bed Granulation Amount: None Present (0%) Exposed Structure Necrotic Amount: Small (1-33%) Fascia Exposed: No Fat Layer (Subcutaneous Tissue) Exposed: Yes Tendon Exposed: No Muscle Exposed: No Joint Exposed: No Bone Exposed: No Electronic Signature(s) Signed: 03/04/2022 11:57:56 AM By: [name] Signed: 03/05/2022 1:08:07 PM By: Gretta Cool, BSN, RN, CWS, Kim RN, BSN Entered By: [name] on 03/02/2022  [name]:58:21 -------------------------------------------------------------------------------- [name] Patient Name: Date of Service: Kelly Clock, Kelly TTIE [name]. 03/02/2022 9:15 A [name]: [id number] Patient Account Number: [id number] Date of Birth/Sex: Treating RN: [name], [name] (47 y.o. [name] Adylee Leonardo: [name] Other Clinician: [name] Referring Dmya Long: Treating Zakiah Gauthreaux/Extender: [name] in Treatment: 27 Vital Signs Time Taken: [name]:29 Temperature (F): 99.0 Height (in): 67 Pulse (bpm):  94 Weight (lbs): 153 Respiratory Rate (breaths/min): 16 Body Mass Index (BMI): 24 Blood Pressure (mmHg): 101/67 Reference Range: 80 - 120 mg / dl Electronic Signature(s) Signed: 03/04/2022 11:57:56 AM By: [name] Entered By: [name] on 03/02/2022 [name]:31:[name] [name], Aldora [name] ([id number] Page [name] of [name]

## 2022-03-03 DIAGNOSIS — G7100 Muscular dystrophy, unspecified: Secondary | ICD-10-CM | POA: Diagnosis not present

## 2022-03-03 DIAGNOSIS — G600 Hereditary motor and sensory neuropathy: Secondary | ICD-10-CM | POA: Diagnosis not present

## 2022-03-03 DIAGNOSIS — M86671 Other chronic osteomyelitis, right ankle and foot: Secondary | ICD-10-CM | POA: Diagnosis not present

## 2022-03-03 DIAGNOSIS — L97512 Non-pressure chronic ulcer of other part of right foot with fat layer exposed: Secondary | ICD-10-CM | POA: Diagnosis not present

## 2022-03-03 DIAGNOSIS — L84 Corns and callosities: Secondary | ICD-10-CM | POA: Diagnosis not present

## 2022-03-04 ENCOUNTER — Telehealth: Payer: Self-pay | Admitting: Family Medicine

## 2022-03-04 ENCOUNTER — Other Ambulatory Visit: Payer: Self-pay | Admitting: Nurse Practitioner

## 2022-03-04 ENCOUNTER — Telehealth (INDEPENDENT_AMBULATORY_CARE_PROVIDER_SITE_OTHER): Payer: Medicare HMO | Admitting: Nurse Practitioner

## 2022-03-04 ENCOUNTER — Encounter: Payer: Self-pay | Admitting: Nurse Practitioner

## 2022-03-04 ENCOUNTER — Other Ambulatory Visit: Payer: Self-pay | Admitting: Physician Assistant

## 2022-03-04 VITALS — Ht 67.0 in | Wt 154.0 lb

## 2022-03-04 DIAGNOSIS — M25461 Effusion, right knee: Secondary | ICD-10-CM

## 2022-03-04 DIAGNOSIS — M25561 Pain in right knee: Secondary | ICD-10-CM

## 2022-03-04 DIAGNOSIS — M86671 Other chronic osteomyelitis, right ankle and foot: Secondary | ICD-10-CM

## 2022-03-04 DIAGNOSIS — L97512 Non-pressure chronic ulcer of other part of right foot with fat layer exposed: Secondary | ICD-10-CM
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
## 2022-03-04 NOTE — Telephone Encounter (Signed)
Patient called and is getting a MRI for rt foot through wound care. She is requesting to have a MRI at the same time have her rt knee scanned.Appointment with Ollen Gross.

## 2022-03-04 NOTE — Assessment & Plan Note (Addendum)
Advised that it will be difficult to get insurance approval for a MRI without prior imaging such as x-ray. Will place x-ray order for her to complete at outpatient imaging. Patient tearful during visit due to being in a lot of pain. Discussed with patient need for in person evaluation, advised seeking care at [facility]. Patient will either get x-ray at [facility] or seek care at [facility]. Discussed with patient if she is in significant pain, importance of an in person evaluation. Patient verbalized understanding.

## 2022-03-04 NOTE — Telephone Encounter (Signed)
Pt sched w/ Ollen Gross for eval

## 2022-03-05 LAB — AEROBIC CULTURE W GRAM STAIN (SUPERFICIAL SPECIMEN): Gram Stain: NONE SEEN

## 2022-03-08 ENCOUNTER — Ambulatory Visit
Admission: RE | Admit: 2022-03-08 | Discharge: 2022-03-08 | Disposition: A | Discharge status: Home / Self Care | Payer: Medicare HMO | Source: Ambulatory Visit | Attending: Physician Assistant | Admitting: Physician Assistant

## 2022-03-08 ENCOUNTER — Other Ambulatory Visit: Payer: Self-pay | Admitting: Physician Assistant

## 2022-03-08 DIAGNOSIS — L03115 Cellulitis of right lower limb: Secondary | ICD-10-CM | POA: Diagnosis not present

## 2022-03-08 DIAGNOSIS — L97512 Non-pressure chronic ulcer of other part of right foot with fat layer exposed: Secondary | ICD-10-CM

## 2022-03-08 DIAGNOSIS — S91301A Unspecified open wound, right foot, initial encounter: Secondary | ICD-10-CM | POA: Diagnosis not present

## 2022-03-08 DIAGNOSIS — M86671 Other chronic osteomyelitis, right ankle and foot: Secondary | ICD-10-CM | POA: Diagnosis not present

## 2022-03-08 DIAGNOSIS — M19071 Primary osteoarthritis, right ankle and foot: Secondary | ICD-10-CM | POA: Diagnosis not present

## 2022-03-09 ENCOUNTER — Other Ambulatory Visit: Payer: Self-pay | Admitting: Physician Assistant

## 2022-03-09 ENCOUNTER — Ambulatory Visit
Admission: RE | Admit: 2022-03-09 | Discharge: 2022-03-09 | Disposition: A | Discharge status: Home / Self Care | Payer: Medicare HMO | Source: Ambulatory Visit | Attending: Physician Assistant | Admitting: Physician Assistant

## 2022-03-09 ENCOUNTER — Encounter: Payer: Medicare HMO | Admitting: Physician Assistant

## 2022-03-09 DIAGNOSIS — M71061 Abscess of bursa, right knee: Secondary | ICD-10-CM | POA: Diagnosis not present

## 2022-03-09 DIAGNOSIS — L97512 Non-pressure chronic ulcer of other part of right foot with fat layer exposed: Secondary | ICD-10-CM | POA: Diagnosis not present

## 2022-03-09 DIAGNOSIS — Z833 Family history of diabetes mellitus: Secondary | ICD-10-CM | POA: Diagnosis not present

## 2022-03-09 DIAGNOSIS — G600 Hereditary motor and sensory neuropathy: Secondary | ICD-10-CM | POA: Diagnosis not present

## 2022-03-09 DIAGNOSIS — R2241 Localized swelling, mass and lump, right lower limb: Secondary | ICD-10-CM

## 2022-03-09 DIAGNOSIS — J45909 Unspecified asthma, uncomplicated: Secondary | ICD-10-CM | POA: Diagnosis present

## 2022-03-09 DIAGNOSIS — Z720 Tobacco use: Secondary | ICD-10-CM | POA: Diagnosis not present

## 2022-03-09 DIAGNOSIS — L03115 Cellulitis of right lower limb: Secondary | ICD-10-CM | POA: Diagnosis not present

## 2022-03-09 DIAGNOSIS — F419 Anxiety disorder, unspecified: Secondary | ICD-10-CM | POA: Diagnosis not present

## 2022-03-09 DIAGNOSIS — M00261 Other streptococcal arthritis, right knee: Secondary | ICD-10-CM | POA: Diagnosis not present

## 2022-03-09 DIAGNOSIS — M7989 Other specified soft tissue disorders: Secondary | ICD-10-CM | POA: Diagnosis not present

## 2022-03-09 DIAGNOSIS — F418 Other specified anxiety disorders: Secondary | ICD-10-CM | POA: Diagnosis not present

## 2022-03-09 DIAGNOSIS — M009 Pyogenic arthritis, unspecified: Secondary | ICD-10-CM | POA: Diagnosis not present

## 2022-03-09 DIAGNOSIS — M86671 Other chronic osteomyelitis, right ankle and foot: Secondary | ICD-10-CM | POA: Diagnosis present

## 2022-03-09 DIAGNOSIS — B954 Other streptococcus as the cause of diseases classified elsewhere: Secondary | ICD-10-CM | POA: Diagnosis present

## 2022-03-09 DIAGNOSIS — R600 Localized edema: Secondary | ICD-10-CM | POA: Diagnosis not present

## 2022-03-09 DIAGNOSIS — Z83438 Family history of other disorder of lipoprotein metabolism and other lipidemia: Secondary | ICD-10-CM | POA: Diagnosis not present

## 2022-03-09 DIAGNOSIS — E876 Hypokalemia: Secondary | ICD-10-CM | POA: Diagnosis not present

## 2022-03-09 DIAGNOSIS — F17210 Nicotine dependence, cigarettes, uncomplicated: Secondary | ICD-10-CM | POA: Diagnosis present

## 2022-03-09 DIAGNOSIS — F32A Depression, unspecified: Secondary | ICD-10-CM | POA: Diagnosis not present

## 2022-03-09 DIAGNOSIS — Z791 Long term (current) use of non-steroidal anti-inflammatories (NSAID): Secondary | ICD-10-CM | POA: Diagnosis not present

## 2022-03-09 DIAGNOSIS — G7100 Muscular dystrophy, unspecified: Secondary | ICD-10-CM | POA: Diagnosis not present

## 2022-03-09 DIAGNOSIS — Z79899 Other long term (current) drug therapy: Secondary | ICD-10-CM | POA: Diagnosis not present

## 2022-03-09 DIAGNOSIS — M25561 Pain in right knee: Secondary | ICD-10-CM | POA: Diagnosis present

## 2022-03-09 DIAGNOSIS — M25461 Effusion, right knee: Secondary | ICD-10-CM | POA: Diagnosis not present

## 2022-03-09 DIAGNOSIS — Z87891 Personal history of nicotine dependence: Secondary | ICD-10-CM | POA: Diagnosis not present

## 2022-03-09 DIAGNOSIS — M7121 Synovial cyst of popliteal space [Baker], right knee: Secondary | ICD-10-CM | POA: Diagnosis present

## 2022-03-09 DIAGNOSIS — L02415 Cutaneous abscess of right lower limb: Secondary | ICD-10-CM | POA: Diagnosis not present

## 2022-03-09 DIAGNOSIS — Z8249 Family history of ischemic heart disease and other diseases of the circulatory system: Secondary | ICD-10-CM | POA: Diagnosis not present

## 2022-03-09 DIAGNOSIS — J4520 Mild intermittent asthma, uncomplicated: Secondary | ICD-10-CM | POA: Diagnosis not present

## 2022-03-09 DIAGNOSIS — Z888 Allergy status to other drugs, medicaments and biological substances status: Secondary | ICD-10-CM | POA: Diagnosis not present

## 2022-03-09 DIAGNOSIS — M79661 Pain in right lower leg: Secondary | ICD-10-CM | POA: Diagnosis not present

## 2022-03-09 DIAGNOSIS — L02611 Cutaneous abscess of right foot: Secondary | ICD-10-CM | POA: Diagnosis not present

## 2022-03-09 DIAGNOSIS — L84 Corns and callosities: Secondary | ICD-10-CM | POA: Diagnosis not present

## 2022-03-09 DIAGNOSIS — G8929 Other chronic pain: Secondary | ICD-10-CM | POA: Diagnosis not present

## 2022-03-09 DIAGNOSIS — N393 Stress incontinence (female) (male): Secondary | ICD-10-CM | POA: Diagnosis present

## 2022-03-09 DIAGNOSIS — Z885 Allergy status to narcotic agent status: Secondary | ICD-10-CM | POA: Diagnosis not present

## 2022-03-09 DIAGNOSIS — M00861 Arthritis due to other bacteria, right knee: Secondary | ICD-10-CM | POA: Diagnosis not present

## 2022-03-09 DIAGNOSIS — Z8616 Personal history of COVID-19: Secondary | ICD-10-CM | POA: Diagnosis not present

## 2022-03-09 DIAGNOSIS — Z880 Allergy status to penicillin: Secondary | ICD-10-CM | POA: Diagnosis not present

## 2022-03-09 DIAGNOSIS — Z91030 Bee allergy status: Secondary | ICD-10-CM | POA: Diagnosis not present

## 2022-03-10 NOTE — Progress Notes (Addendum)
[name] ([id number]) [id number] Page 1 of 9 Visit Report for 03/09/2022 Chief Complaint Document Details Patient Name: Date of Service: [name]. 03/09/2022 9:15 A [name]: [id number] Patient Account Number: [id number] Date of Birth/Sex: Treating RN: Mar 16, 1975 (47 y.o. [name] Primary Care Provider: [name] Other Clinician: [name] Referring Provider: Treating Provider/Extender: [name] in Treatment: 28 Information Obtained from: Patient Chief Complaint Right foot ulcer Electronic Signature(s) Signed: 03/09/2022 9:44:26 AM By: [name] Entered By: Worthy Keeler on 03/09/2022 09:44:26 -------------------------------------------------------------------------------- Debridement Details Patient Name: Date of Service: [name]. 03/09/2022 9:15 A [name]: [id number] Patient Account Number: [id number] Date of Birth/Sex: Treating RN: 09/30/1975 (47 y.o. [name] Primary Care Provider: [name] Other Clinician: [name] Referring Provider: Treating Provider/Extender: [name] in Treatment: 28 Debridement Performed for Assessment: Wound #2 Plantar Foot Performed By: Physician [name]., [name] Debridement Type: Debridement Level of Consciousness (Pre-procedure): Awake and Alert Pre-procedure Verification/Time Out Yes - 09:50 Taken: Start Time: 09:50 T Area Debrided (L x W): otal 0.5 (cm) x 0.5 (cm) = 0.25 (cm) Tissue and other material debrided: Viable, Non-Viable, Callus Level: Non-Viable Tissue Debridement Description: Selective/Open Wound Instrument: Curette Bleeding: Minimum Hemostasis Achieved: Pressure Response to Treatment: Procedure was tolerated well Level of Consciousness (Post- Awake and Alert procedure): Post Debridement Measurements of Total Wound [name], [name]  ([id number]) [id number] Page 2 of 9 Length: (cm) 0.1 Width: (cm) 0.1 Depth: (cm) 0.1 Volume: (cm) 0.001 Character of Wound/Ulcer Post Debridement: Stable Post Procedure Diagnosis Same as Pre-procedure Electronic Signature(s) Signed: 03/09/2022 5:46:15 PM By: [name] Signed: 03/11/2022 11:31:26 AM By: Gretta Cool BSN, RN, CWS, Kim RN, BSN Signed: 03/15/2022 9:50:02 AM By: [name] Entered By: [name] on 03/09/2022 09:59:03 -------------------------------------------------------------------------------- HPI Details Patient Name: Date of Service: [name], [name]. 03/09/2022 9:15 A [name]: [id number] Patient Account Number: [id number] Date of Birth/Sex: Treating RN: 28-Nov-1975 (47 y.o. [name] Primary Care Provider: [name] Other Clinician: [name] Referring Provider: Treating Provider/Extender: [name] in Treatment: 28 History of Present Illness HPI Description: 47 year old patient sent to [name] by her PCP 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 Her notes were reviewed and there was extensive history of opioid treatment and management by the [facility] pain clinic from where she has been terminated. X-ray of the right foot foot was done on 03/13/2015 and was suggestive of active infection and possible osteomyelitis. her past surgical history includes amputation of the left big toe in 2011 and amputation of the right big toe in 2015. She was sitting the wound clinic at [facility] and we will try and obtain these notes. She is a smoker and smokes about 15 cigarettes a day. 06/12/2015 - x-ray of the right foot -- IMPRESSION:1. Soft tissue swelling of the plantar surface of the foot. No underlying acute  abnormality. 2. Amputation deformity again of the right great toe. Old healed bony deformity noted the right second metatarsal. No acute bony abnormality identified. We have not received any reports from the [facility] hospital yet. She continues to smoke at least 10 cigarettes a day 06/19/2015 -- she has increasing pain in the right foot and also has  had some low-grade fever. Her vascular test is scheduled for tomorrow and her MRIs not till next week. Readmission: 08-20-2021 upon evaluation today patient presents for initial inspection here in our clinic although she has been seen that was back in 2017. At that time she was having a issue with her right foot. With that being said upon inspection today she is actually having issues with the right foot as well on the medial aspect where she has some breakdown due to what I am assuming is pressure to the foot location. 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 I did review her culture report which showed evidence of bacteria including hemolytic Streptococcus group A as well as Staphylococcus aureus. The patient is currently on doxycycline and tells me that this is getting much better. T opical gentamicin is also being utilized. Also did review her arterial studies which show that she has on the right and ABI of 1.15 which is triphasic and appears to be excellent there is no significant peripheral artery disease noted the left is also. With ABI of 1.11 and a good report in that regard as well. Lastly I did also review her MRI of the right forefoot which was performed on 07-06-2021. This shows that she does have mild subcortical marrow edema in the stump of the first metatarsal concerning for osteomyelitis. There is also a malunited fracture of the second metatarsal. The patient has bone  marrow edema in the base of the second third fourth and fifth metatarsals concerning for stress reaction obviously this infection is going to be an issue for [name] here as well she needs to be treated appropriately and I do believe that the doxycycline is a good option right now although I may need to #1 extend this and #2 plan for even altering or adding to the treatment regimen depending on how things progress. Patient voiced understanding.Marland Kitchen 09-01-2021 upon evaluation today patient appears to be doing excellent in regard to her wound. She is actually showing signs of excellent improvement which is great news. Fortunately I do not see any signs of active infection locally or systemically at this point which is great news and overall I am extremely pleased with where things stand today. I do not see any evidence of active infection locally or systemically. 09-07-2021 upon evaluation today patient appears to be doing well with regard to her foot ulcer. This is actually showing signs of improvement which is great news. Fortunately there does not appear to be any signs of active infection locally or systemically at this time which is as well. 09-22-2021 upon evaluation today patient appears to be doing well currently in regard to her wound. This is measuring smaller and looking better were slowly making progress here. Fortunately there does not appear to be any evidence of active infection locally or systemically at this time. Upon inspection patient's ALLEXIS, GELHAUS ([id number]) [id number] Page 3 of 9 wound bed showed evidence of good granulation and epithelization at this point. 10-05-2021 upon evaluation today patient's wound shows a lot of callus buildup but the surface of the wound is actually doing quite well. She is going require some sharp debridement to clearway some of the necrotic debris. 10-19-2021 upon evaluation today patient appears to be doing better in regard to her  wound which is actually measuring little bit  smaller today. Fortunately there does not appear to be any signs of significant infection she does have quite a bit of callus we will get have to work on that today. Otherwise I think we are headed in the appropriate direction. 11-09-2021 upon evaluation today patient appears to be doing well in regard to her wound from the standpoint of infection and actually appears to be doing better she actually just completed the doxycycline. With that being said I am hopeful that this took care of the osteomyelitis although that something that we will definitely keep a close eye on. With that being said I do believe that she is still having tremendous amount of callus buildup which I believe in turn is a result of her having issues here with Friction and callus buildup. With that being said I do believe that the patient would benefit here from a total contact cast I discussed that with her today. I do want to get more aggressive to get this closed as I think that if we do not then she is more likely to have a long standing issue here going forward. 11-17-2021 upon evaluation today patient appears to be doing well although her wound is still open. I do think that she is ready to be placed in a total contact cast. She actually has come back on Thursday to have this changed out. Fortunately and overall I do not see any signs of worsening and I think that we should be able to get this on without any complication or problem.. 11-19-2021 upon evaluation today patient appears to be doing well currently in regard to her wound. She has been tolerating the dressing changes without complication and overall I am extremely pleased with where we stand today. I do not see any evidence of active infection locally or systemically at this time which is great news. I think the total contact cast did an awesome job for her. 11/9; right foot in the setting of idiopathic peripheral  neuropathy and Charcot deformity previous amputations. Only a very small area remains on the plantar foot. I had to use illumination to even see anything that looked open here. Much improved 12-03-2021 upon evaluation today patient appears to be doing well currently in regard to her wound. Of note she tells me that she did end up cutting off her cast with a pair of kitchen shears 2 nights ago. She states that she has been walking on it more than she should have after the cast was put on due to a friend who ended up in the hospital that it caught her right after she had a put on that same day. She feels like she probably deformed the cast that it was causing some pressure points that she was afraid to get a blister and did not want another wound. For that reason she did go ahead and cut the soft and does not appear to have caused any damage. She tells me that it only took 7 minutes. With that being said I do think the wound actually looks really good she has a lot of callus buildup I am going to remove that just to make sure nothing side underneath but other than that overall she seems to be doing quite well. 12-07-2021 upon evaluation today patient's wound is actually showing signs of excellent improvement and very pleased with where we stand I think we are headed in the right direction. Fortunately there does not appear to be any signs of active infection at this  time which is great news. No fevers, chills, nausea, vomiting, or diarrhea. 01-14-2022 upon evaluation today patient appears to be doing well currently in regard to her wound she does have a bit of callus buildup unfortunately but otherwise does not seem to show any signs of active infection locally or systemically at this time. Fortunately I do not see any evidence of infection locally or systemically which is great news. She does have a lot of callus buildup she has been very sick so has not been in to be seen recently. With that being  said I do not think her wound is a whole lot worse than what it was before but it is also a lot more callused over so it is difficult to tell initially. 01-19-2022 upon evaluation today patient appears to be doing poorly unfortunately in regard to her wound. She has been tolerating the dressing changes without complication prior but unfortunately in the past week she has developed issues with redness and erythema on her foot with a new wound on the top of her foot which was not there last time I saw her. Unfortunately this seems to be getting significantly worse she is also had temperatures ranging up to 103 although it was 99.9 here in the office today it was not nearly that bad. Fortunately I do not see any signs obvious of sepsis but nonetheless I am concerned about the significance of this infection I think she does need to probably get into the ER ASAP in order to be evaluated and treated appropriately based on what we are seeing. The patient is very worried about the situation here. With that being said she tells me that she has been wearing even prior to coming in today. 01-26-2022 upon evaluation today patient actually appears to be doing significantly better despite the fact she did not go to the ER last week she tells me she had a lot going on with family and otherwise just was not able to make it. Fortunately there does not appear to be any signs of infection locally or systemically at this time. 02-16-2022 upon evaluation today patient appears to be doing well the wound on the top of her foot is looking smaller the wound on the bottom/medial portion of her foot is going require some debridement today to clearway some of the necrotic debris but overall looks to be doing quite well. I do not see any evidence of infection locally nor systemically at this time which is great news. 03-02-2022 upon evaluation today patient appears to be doing poorly in regard to her foot. It actually appears that she  is probably infected based on what I am seeing and I think that she is going require some intervention in this regard. I think that we will probably put her back on doxycycline and also believe that she really needs an MRI repeated it has not been done since June antibiotics even then she showed some signs of osteomyelitis I feel like this may be getting worse. She still has not gone to the hospital as previously recommended although the infection did seem to clear with the doxycycline which is why I am going to go ahead and redo that she also states that she is not to be able to go to the hospital today. 03-09-2022 upon evaluation today patient appears to be doing well with regard to her foot unfortunately she is not doing nearly as well in regard to her leg which is showing signs of swelling. She  tells me she has been having issues with her knee she wants to see orthopedics she has not gone anywhere as of yet I do think she should go to [facility] for further evaluation and treatment. She voiced understanding. With that being said again her foot is doing well mainly because she has been off of her leg due to the pain in her knee and I think this has been one of the primary issues here at this point. Electronic Signature(s) Signed: 03/09/2022 5:35:04 PM By: [name] Entered By: Worthy Keeler on 03/09/2022 17:35:04 [name], [name] ([id number]) [id number] Page 4 of 9 -------------------------------------------------------------------------------- Physical Exam Details Patient Name: Date of Service: [name]. 03/09/2022 9:15 A [name]: [id number] Patient Account Number: [id number] Date of Birth/Sex: Treating RN: 1975/11/07 (47 y.o. [name] Primary Care Provider: [name] Other Clinician: [name] Referring Provider: Treating Provider/Extender: [name] in Treatment:  78 Constitutional Well-nourished and well-hydrated in no acute distress. Respiratory normal breathing without difficulty. Psychiatric this patient is able to make decisions and demonstrates good insight into disease process. Alert and Oriented x 3. pleasant and cooperative. Notes I did evaluate the foot and did not see anything open at this point. Again there was nothing hiding under any callus and overall I think the foot appears to be healed she has been off of it pretty much all week she has been on the doxycycline which I think has helped. Nonetheless she still has some swelling in the leg in general and up to the knee area she is not sure what is going on there. We discussed the possibility of a blood clot which is not out of the question although she tells me the knee pain has been going on for some time. I think she should be seen by [facility] she is going to go there upon leaving here today. Electronic Signature(s) Signed: 03/09/2022 5:36:26 PM By: [name] Previous Signature: 03/09/2022 5:36:03 PM Version By: [name] Entered By: Worthy Keeler on 03/09/2022 17:36:26 -------------------------------------------------------------------------------- Physician Orders Details Patient Name: Date of Service: [name], [name]. 03/09/2022 9:15 A [name]: [id number] Patient Account Number: [id number] Date of Birth/Sex: Treating RN: May 23, 1975 (47 y.o. 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 Bathing/ Shower/ Hygiene May shower; gently cleanse wound with antibacterial soap, rinse and pat dry prior to dressing wounds No tub bath. Anesthetic (Use 'Patient Medications' Section for Anesthetic Order Entry) Lidocaine applied to wound bed [name], [name] ([id number]) [id number] Page 5 of 9 Edema Control - Lymphedema / Segmental Compressive Device / Other Elevate, Exercise Daily and A void Standing for Long Periods of Time. Elevate legs to the level of the heart and pump ankles as often as possible Elevate leg(s) parallel to the floor when sitting. Medications-Please add to medication list. Wound #2 Plantar Foot Take one '500mg'$  Tylenol (A cetaminophen) and one '200mg'$  Motrin (Ibuprofen) every 6 hours for pain. Do not take ibuprofen if you are on blood thinners or have  stomach ulcers. ntibiotics - continue Doxycycline P.O. A Wound Treatment Wound #2 - Foot Wound Laterality: Plantar Cleanser: Byram Ancillary Kit - 15 Day Supply (Generic) 3 x Per Week/30 Days Discharge Instructions: Use supplies as instructed; Kit contains: (15) Saline Bullets; (15) 3x3 Gauze; 15 pr Gloves Cleanser: Soap and Water 3 x Per Week/30 Days Discharge Instructions: Gently cleanse wound with antibacterial soap, rinse and pat dry prior to dressing wounds Secondary Dressing: (BORDER) Zetuvit Plus SILICONE BORDER Dressing 4x4 (in/in) (Dispense As Written) 3 x Per Week/30 Days Discharge Instructions: Please do not put silicone bordered dressings under wraps. Use non-bordered dressing only. Electronic Signature(s) Signed: 03/09/2022 5:46:15 PM By: [name] Signed: 03/15/2022 9:50:02 AM By: [name] Entered By: [name] on 03/09/2022 09:59:34 -------------------------------------------------------------------------------- Problem List Details Patient Name: Date of Service: [name], [name]. 03/09/2022 9:15 A [name]: [id number] Patient Account Number: [id number] Date of Birth/Sex: Treating RN: [birth year] (47 y.o. 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 03/09/2022 9:15 A [name]: [id number] Patient Account Number: [id number] Date of Birth/Sex: Treating RN: [birth year] (47 y.o. [name] Primary Care Provider: [name] Other Clinician: [name] Referring Provider: Treating Provider/Extender: [name] in Treatment: 28 Subjective Chief Complaint Information obtained from Patient Right foot ulcer History of Present Illness (HPI) 47 year old patient sent to [name] by her PCP 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 Her notes were reviewed and there was extensive history of opioid  treatment and management by the [facility] pain clinic from where she has been terminated. X-ray of the right foot foot was done on 03/13/2015 and was suggestive of active infection and possible osteomyelitis. her past surgical history includes amputation of the left big toe in 2011 and amputation of the right big toe in 2015. She was sitting the wound clinic at [facility] and we will try and obtain these notes. She is a smoker and smokes about 15 cigarettes a day. 06/12/2015 - x-ray of the right foot -- IMPRESSION:1. Soft tissue swelling of the plantar surface of the foot. No underlying acute abnormality. 2. Amputation deformity again of the right great toe. Old healed bony deformity noted the right second metatarsal. No acute bony abnormality identified. We have not received any reports from the [facility] hospital yet. She continues to smoke at least 10 cigarettes a day 06/19/2015 -- she has increasing pain in the right foot and also has had some low-grade fever. Her vascular test is scheduled for tomorrow and her MRIs not till next week. Readmission: 08-20-2021 upon evaluation today patient presents for initial  inspection here in our clinic although she has been seen that was back in 2017. At that time she was having a issue with her right foot. With that being said upon inspection today she is actually having issues with the right foot as well on the medial aspect where she has some breakdown due to what I am assuming is pressure to the foot location. 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 I did review her culture report which showed evidence of bacteria including hemolytic Streptococcus group A as well as Staphylococcus aureus. The patient is currently on doxycycline and tells me that this is getting much better. T opical  gentamicin is also being utilized. Also did review her arterial studies which show that she has on the right and ABI of 1.15 which is triphasic and appears to be excellent there is no significant peripheral artery disease noted the left is also. With ABI of 1.11 and a good report in that regard as well. Lastly I did also review her MRI of the right forefoot which was performed on 07-06-2021. This shows that she does have mild subcortical marrow edema in the stump of the first metatarsal concerning for osteomyelitis. There is also a malunited fracture of the second metatarsal. The patient has bone marrow edema in the base of the second third fourth and fifth metatarsals concerning for stress reaction obviously this infection is going to be an issue for [name] here as well she needs to be treated appropriately and I do believe that the doxycycline is a good option right now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

## 2022-03-11 DIAGNOSIS — M25461 Effusion, right knee: Secondary | ICD-10-CM | POA: Diagnosis not present

## 2022-03-11 DIAGNOSIS — M009 Pyogenic arthritis, unspecified: Secondary | ICD-10-CM | POA: Diagnosis not present

## 2022-03-12 ENCOUNTER — Other Ambulatory Visit: Payer: Self-pay

## 2022-03-12 ENCOUNTER — Emergency Department: Payer: Medicare HMO

## 2022-03-12 ENCOUNTER — Encounter: Payer: Self-pay | Admitting: Internal Medicine

## 2022-03-12 ENCOUNTER — Inpatient Hospital Stay
Admission: EM | Admit: 2022-03-12 | Discharge: 2022-03-15 | DRG: 486 | Disposition: A | Discharge status: Home Health | Payer: Medicare HMO | Attending: Internal Medicine | Admitting: Internal Medicine

## 2022-03-12 DIAGNOSIS — Z91030 Bee allergy status: Secondary | ICD-10-CM

## 2022-03-12 DIAGNOSIS — M00261 Other streptococcal arthritis, right knee: Secondary | ICD-10-CM | POA: Diagnosis present

## 2022-03-12 DIAGNOSIS — Z8249 Family history of ischemic heart disease and other diseases of the circulatory system: Secondary | ICD-10-CM | POA: Diagnosis not present

## 2022-03-12 DIAGNOSIS — B954 Other streptococcus as the cause of diseases classified elsewhere: Secondary | ICD-10-CM | POA: Diagnosis present

## 2022-03-12 DIAGNOSIS — Z720 Tobacco use: Secondary | ICD-10-CM | POA: Diagnosis not present

## 2022-03-12 DIAGNOSIS — F32A Depression, unspecified: Secondary | ICD-10-CM | POA: Diagnosis present

## 2022-03-12 DIAGNOSIS — J45909 Unspecified asthma, uncomplicated: Secondary | ICD-10-CM | POA: Diagnosis present

## 2022-03-12 DIAGNOSIS — Z885 Allergy status to narcotic agent status: Secondary | ICD-10-CM | POA: Diagnosis not present

## 2022-03-12 DIAGNOSIS — F419 Anxiety disorder, unspecified: Secondary | ICD-10-CM | POA: Diagnosis present

## 2022-03-12 DIAGNOSIS — M25561 Pain in right knee: Secondary | ICD-10-CM | POA: Insufficient documentation

## 2022-03-12 DIAGNOSIS — Z83438 Family history of other disorder of lipoprotein metabolism and other lipidemia: Secondary | ICD-10-CM | POA: Diagnosis not present

## 2022-03-12 DIAGNOSIS — N393 Stress incontinence (female) (male): Secondary | ICD-10-CM | POA: Diagnosis present

## 2022-03-12 DIAGNOSIS — M009 Pyogenic arthritis, unspecified: Secondary | ICD-10-CM | POA: Diagnosis present

## 2022-03-12 DIAGNOSIS — Z791 Long term (current) use of non-steroidal anti-inflammatories (NSAID): Secondary | ICD-10-CM

## 2022-03-12 DIAGNOSIS — F17210 Nicotine dependence, cigarettes, uncomplicated: Secondary | ICD-10-CM | POA: Diagnosis present

## 2022-03-12 DIAGNOSIS — Z880 Allergy status to penicillin: Secondary | ICD-10-CM | POA: Diagnosis not present

## 2022-03-12 DIAGNOSIS — Z8616 Personal history of COVID-19: Secondary | ICD-10-CM

## 2022-03-12 DIAGNOSIS — E876 Hypokalemia: Secondary | ICD-10-CM | POA: Diagnosis present

## 2022-03-12 DIAGNOSIS — L03115 Cellulitis of right lower limb: Secondary | ICD-10-CM | POA: Diagnosis present

## 2022-03-12 DIAGNOSIS — J4520 Mild intermittent asthma, uncomplicated: Secondary | ICD-10-CM | POA: Diagnosis not present

## 2022-03-12 DIAGNOSIS — Z833 Family history of diabetes mellitus: Secondary | ICD-10-CM | POA: Diagnosis not present

## 2022-03-12 DIAGNOSIS — Z79899 Other long term (current) drug therapy: Secondary | ICD-10-CM | POA: Diagnosis not present

## 2022-03-12 DIAGNOSIS — Z888 Allergy status to other drugs, medicaments and biological substances status: Secondary | ICD-10-CM | POA: Diagnosis not present

## 2022-03-12 DIAGNOSIS — M7121 Synovial cyst of popliteal space [Baker], right knee: Secondary | ICD-10-CM | POA: Diagnosis present

## 2022-03-12 LAB — HIV ANTIBODY (ROUTINE TESTING W REFLEX): HIV Screen 4th Generation wRfx: NONREACTIVE

## 2022-03-12 LAB — CBC WITH DIFFERENTIAL/PLATELET
Abs Immature Granulocytes: 0.09 K/uL — ABNORMAL HIGH (ref 0.00–0.07)
Basophils Absolute: 0.1 K/uL (ref 0.0–0.1)
Basophils Relative: 0 %
Eosinophils Absolute: 0.2 K/uL (ref 0.0–0.5)
Eosinophils Relative: 1 %
HCT: 37.7 % (ref 36.0–46.0)
Hemoglobin: 11.9 g/dL — ABNORMAL LOW (ref 12.0–15.0)
Immature Granulocytes: 1 %
Lymphocytes Relative: 29 %
Lymphs Abs: 4 K/uL (ref 0.7–4.0)
MCH: 29.5 pg (ref 26.0–34.0)
MCHC: 31.6 g/dL (ref 30.0–36.0)
MCV: 93.5 fL (ref 80.0–100.0)
Monocytes Absolute: 1.2 K/uL — ABNORMAL HIGH (ref 0.1–1.0)
Monocytes Relative: 9 %
Neutro Abs: 8.3 K/uL — ABNORMAL HIGH (ref 1.7–7.7)
Neutrophils Relative %: 60 %
Platelets: 499 K/uL — ABNORMAL HIGH (ref 150–400)
RBC: 4.03 MIL/uL (ref 3.87–5.11)
RDW: 14.2 % (ref 11.5–15.5)
WBC: 13.7 K/uL — ABNORMAL HIGH (ref 4.0–10.5)
nRBC: 0 % (ref 0.0–0.2)

## 2022-03-12 LAB — SYNOVIAL CELL COUNT + DIFF, W/ CRYSTALS
Crystals, Fluid: NONE SEEN
Lymphocytes-Synovial Fld: 5 %
Monocyte-Macrophage-Synovial Fluid: 5 %
Neutrophil, Synovial: 90 %
WBC, Synovial: 176423 /mm3 — ABNORMAL HIGH (ref 0–200)

## 2022-03-12 LAB — COMPREHENSIVE METABOLIC PANEL
ALT: 46 U/L — ABNORMAL HIGH (ref 0–44)
AST: 36 U/L (ref 15–41)
Albumin: 2.5 g/dL — ABNORMAL LOW (ref 3.5–5.0)
Alkaline Phosphatase: 63 U/L (ref 38–126)
Anion gap: 11 (ref 5–15)
BUN: 9 mg/dL (ref 6–20)
CO2: 29 mmol/L (ref 22–32)
Calcium: 8.3 mg/dL — ABNORMAL LOW (ref 8.9–10.3)
Chloride: 96 mmol/L — ABNORMAL LOW (ref 98–111)
Creatinine, Ser: 0.51 mg/dL (ref 0.44–1.00)
GFR, Estimated: >60 mL/min (ref >60)
Glucose, Bld: 97 mg/dL (ref 70–99)
Potassium: 3.4 mmol/L — ABNORMAL LOW (ref 3.5–5.1)
Sodium: 136 mmol/L (ref 135–145)
Total Bilirubin: 0.5 mg/dL (ref 0.3–1.2)
Total Protein: 6.4 g/dL — ABNORMAL LOW (ref 6.5–8.1)

## 2022-03-12 LAB — PROTIME-INR
INR: 1.2 (ref 0.8–1.2)
Prothrombin Time: 14.7 seconds (ref 11.4–15.2)

## 2022-03-12 LAB — SEDIMENTATION RATE: Sed Rate: 46 mm/h — ABNORMAL HIGH (ref 0–20)

## 2022-03-12 LAB — C-REACTIVE PROTEIN: CRP: 7 mg/dL — ABNORMAL HIGH (ref <1.0)

## 2022-03-12 LAB — MRSA NEXT GEN BY PCR, NASAL: MRSA by PCR Next Gen: NOT DETECTED

## 2022-03-12 LAB — APTT: aPTT: 27 seconds (ref 24–36)

## 2022-03-12 LAB — LACTIC ACID, PLASMA
Lactic Acid, Venous: 1 mmol/L (ref 0.5–1.9)
Lactic Acid, Venous: 1.1 mmol/L (ref 0.5–1.9)

## 2022-03-12 LAB — PROCALCITONIN: Procalcitonin: <0.1 ng/mL

## 2022-03-12 LAB — MAGNESIUM: Magnesium: 2.2 mg/dL (ref 1.7–2.4)

## 2022-03-12 LAB — PREGNANCY, URINE: Preg Test, Ur: NEGATIVE

## 2022-03-12 MED ORDER — LIDOCAINE HCL (PF) 1 % IJ SOLN
5.0000 mL | Freq: Once | INTRAMUSCULAR | Status: DC
  Filled 2022-03-12: qty 5

## 2022-03-12 MED ORDER — OXYCODONE-ACETAMINOPHEN 5-325 MG PO TABS
1.0000 | ORAL_TABLET | ORAL | Status: DC | PRN
  Administered 2022-03-12 – 2022-03-15 (×10): 1 via ORAL
  Filled 2022-03-12 (×12): qty 1

## 2022-03-12 MED ORDER — ONDANSETRON HCL 4 MG/2ML IJ SOLN
4.0000 mg | Freq: Once | INTRAMUSCULAR | Status: AC
  Administered 2022-03-12: 4 mg via INTRAVENOUS
  Filled 2022-03-12: qty 2

## 2022-03-12 MED ORDER — CLINDAMYCIN PHOSPHATE 600 MG/50ML IV SOLN: 600.0000 mg | Freq: Once | INTRAVENOUS | Status: DC

## 2022-03-12 MED ORDER — POTASSIUM CHLORIDE CRYS ER 20 MEQ PO TBCR
40.0000 meq | EXTENDED_RELEASE_TABLET | Freq: Once | ORAL | Status: AC
  Administered 2022-03-12: 40 meq via ORAL
  Filled 2022-03-12: qty 2

## 2022-03-12 MED ORDER — DM-GUAIFENESIN ER 30-600 MG PO TB12: 1.0000 | ORAL_TABLET | Freq: Two times a day (BID) | ORAL | Status: DC | PRN

## 2022-03-12 MED ORDER — SODIUM CHLORIDE 0.9 % IV SOLN: 2.0000 g | Freq: Once | INTRAVENOUS | Status: DC

## 2022-03-12 MED ORDER — FENTANYL CITRATE PF 50 MCG/ML IJ SOSY
25.0000 ug | PREFILLED_SYRINGE | INTRAMUSCULAR | Status: DC | PRN
  Administered 2022-03-12: 25 ug via INTRAVENOUS
  Filled 2022-03-12: qty 1

## 2022-03-12 MED ORDER — POLYETHYLENE GLYCOL 3350 17 G PO PACK: 17.0000 g | PACK | Freq: Every day | ORAL | Status: DC | PRN

## 2022-03-12 MED ORDER — ALBUTEROL SULFATE (2.5 MG/3ML) 0.083% IN NEBU: 3.0000 mL | INHALATION_SOLUTION | RESPIRATORY_TRACT | Status: DC | PRN

## 2022-03-12 MED ORDER — ESCITALOPRAM OXALATE 10 MG PO TABS
20.0000 mg | ORAL_TABLET | Freq: Every day | ORAL | Status: DC
  Administered 2022-03-14 – 2022-03-15 (×2): 20 mg via ORAL
  Filled 2022-03-12 (×3): qty 2

## 2022-03-12 MED ORDER — FLUTICASONE PROPIONATE 50 MCG/ACT NA SUSP
2.0000 | Freq: Every day | NASAL | Status: DC
  Filled 2022-03-12: qty 16

## 2022-03-12 MED ORDER — MORPHINE SULFATE (PF) 4 MG/ML IV SOLN
4.0000 mg | Freq: Once | INTRAVENOUS | Status: AC
  Administered 2022-03-12: 4 mg via INTRAVENOUS
  Filled 2022-03-12: qty 1

## 2022-03-12 MED ORDER — SODIUM CHLORIDE 0.9 % IV SOLN
2.0000 g | INTRAVENOUS | Status: DC
  Administered 2022-03-12 – 2022-03-14 (×3): 2 g via INTRAVENOUS
  Filled 2022-03-12: qty 2
  Filled 2022-03-12 (×2): qty 20
  Filled 2022-03-12: qty 2

## 2022-03-12 MED ORDER — SODIUM CHLORIDE 0.9 % IV SOLN: INTRAVENOUS | Status: DC

## 2022-03-12 MED ORDER — SODIUM CHLORIDE 0.9 % IV BOLUS
1500.0000 mL | Freq: Once | INTRAVENOUS | Status: AC
  Administered 2022-03-12: 1000 mL via INTRAVENOUS

## 2022-03-12 MED ORDER — BUSPIRONE HCL 15 MG PO TABS
7.5000 mg | ORAL_TABLET | Freq: Three times a day (TID) | ORAL | Status: DC
  Administered 2022-03-12 – 2022-03-15 (×7): 7.5 mg via ORAL
  Filled 2022-03-12 (×10): qty 1

## 2022-03-12 MED ORDER — ACETAMINOPHEN 325 MG PO TABS
650.0000 mg | ORAL_TABLET | Freq: Four times a day (QID) | ORAL | Status: DC | PRN
  Administered 2022-03-14: 650 mg via ORAL
  Filled 2022-03-12: qty 2

## 2022-03-12 MED ORDER — ONDANSETRON HCL 4 MG/2ML IJ SOLN: 4.0000 mg | Freq: Three times a day (TID) | INTRAMUSCULAR | Status: DC | PRN

## 2022-03-12 MED ORDER — NICOTINE 21 MG/24HR TD PT24
21.0000 mg | MEDICATED_PATCH | Freq: Every day | TRANSDERMAL | Status: DC
  Administered 2022-03-12 – 2022-03-15 (×2): 21 mg via TRANSDERMAL
  Filled 2022-03-12 (×4): qty 1

## 2022-03-12 MED ORDER — SODIUM CHLORIDE 0.9 % IV BOLUS
1000.0000 mL | Freq: Once | INTRAVENOUS | Status: AC
  Administered 2022-03-12: 1000 mL via INTRAVENOUS

## 2022-03-12 MED ORDER — LINEZOLID 600 MG/300ML IV SOLN
600.0000 mg | Freq: Two times a day (BID) | INTRAVENOUS | Status: DC
  Administered 2022-03-12 – 2022-03-13 (×3): 600 mg via INTRAVENOUS
  Filled 2022-03-12 (×5): qty 300

## 2022-03-12 MED ORDER — ALUM & MAG HYDROXIDE-SIMETH 200-200-20 MG/5ML PO SUSP
30.0000 mL | Freq: Four times a day (QID) | ORAL | Status: DC | PRN
  Administered 2022-03-12: 30 mL via ORAL
  Filled 2022-03-12: qty 30

## 2022-03-12 NOTE — Plan of Care (Signed)
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
## 2022-03-12 NOTE — ED Provider Notes (Signed)
[facility] Provider Note    Event Date/Time   First MD Initiated Contact with Patient 03/12/22 [id number]     (approximate)   History   Knee Pain   HPI  [name] is a 47 y.o. female with history of osteomyelitis presents emergency department complaining of right knee pain.  Patient states her entire right leg is swollen.  Had an ultrasound done by [facility] this week which was negative for DVT.  [facility] sent her here for additional workup.  No fever or chills.  States her foot is red and swollen has a history of osteomyelitis.      Physical Exam   Triage Vital Signs: ED Triage Vitals  Enc Vitals Group     BP 03/12/22 0856 101/68     Pulse Rate 03/12/22 0856 85     Resp 03/12/22 0856 20     Temp 03/12/22 0856 99.2 F (37.3 C)     Temp Source 03/12/22 0856 Oral     SpO2 03/12/22 0856 100 %     Weight 03/12/22 0857 154 lb 1.6 oz (69.9 kg)     Height 03/12/22 0857 '5\' 7"'$  (1.702 m)     Head Circumference --      Peak Flow --      Pain Score 03/12/22 0857 7     Pain Loc --      Pain Edu? --      Excl. in [place]? --     Most recent vital signs: Vitals:   03/12/22 1253 03/12/22 1320  BP: 104/66 92/61  Pulse: 84 80  Resp: 20 16  Temp: 99.1 F (37.3 C) 98.5 F (36.9 C)  SpO2: 100% 100%     General: Awake, no distress.   CV:  Good peripheral perfusion. regular rate and  rhythm Resp:  Normal effort.  Abd:  No distention.   Other:  Right leg with large amount of edema, tenderness and swelling noted at the right knee, area feels warm to touch neurovascular is intact   ED Results / Procedures / Treatments   Labs (all labs ordered are listed, but only abnormal results are displayed) Labs Reviewed  CBC WITH DIFFERENTIAL/PLATELET - Abnormal; Notable for the following components:      Result Value   WBC 13.7 (*)    Hemoglobin 11.9 (*)    Platelets 499 (*)    Neutro Abs 8.3 (*)    Monocytes Absolute 1.2 (*)    Abs  Immature Granulocytes 0.09 (*)    All other components within normal limits  COMPREHENSIVE METABOLIC PANEL - Abnormal; Notable for the following components:   Potassium 3.4 (*)    Chloride 96 (*)    Calcium 8.3 (*)    Total Protein 6.4 (*)    Albumin 2.5 (*)    ALT 46 (*)    All other components within normal limits  SEDIMENTATION RATE - Abnormal; Notable for the following components:   Sed Rate 46 (*)    All other components within normal limits  CULTURE, BLOOD (ROUTINE X 2)  CULTURE, BLOOD (ROUTINE X 2)  BODY FLUID CULTURE W GRAM STAIN  MRSA NEXT GEN BY PCR, NASAL  LACTIC ACID, PLASMA  LACTIC ACID, PLASMA  MAGNESIUM  PREGNANCY, URINE  CBC WITH DIFFERENTIAL/PLATELET  SYNOVIAL CELL COUNT + DIFF, W/ CRYSTALS  GLUCOSE, BODY FLUID OTHER            PROTEIN, BODY FLUID (OTHER)  PROCALCITONIN  HIV ANTIBODY (  ROUTINE TESTING W REFLEX)  PROTIME-INR  APTT     EKG     RADIOLOGY X-ray of the right knee    PROCEDURES:   Procedures   MEDICATIONS ORDERED IN ED: Medications  lidocaine (PF) (XYLOCAINE) 1 % injection 5 mL (has no administration in time range)  cefTRIAXone (ROCEPHIN) 2 g in sodium chloride 0.9 % 100 mL IVPB (has no administration in time range)  oxyCODONE-acetaminophen (PERCOCET/ROXICET) 5-325 MG per tablet 1 tablet (has no administration in time range)  sodium chloride 0.9 % bolus 1,000 mL (has no administration in time range)  0.9 %  sodium chloride infusion (has no administration in time range)  ondansetron (ZOFRAN) injection 4 mg (has no administration in time range)  acetaminophen (TYLENOL) tablet 650 mg (has no administration in time range)  albuterol (PROVENTIL) (2.5 MG/3ML) 0.083% nebulizer solution 3 mL (has no administration in time range)  dextromethorphan-guaiFENesin (MUCINEX DM) 30-600 MG per 12 hr tablet 1 tablet (has no administration in time range)  fentaNYL (SUBLIMAZE) injection 25 mcg (has no administration in time range)  potassium chloride  SA (KLOR-CON M) CR tablet 40 mEq (has no administration in time range)  nicotine (NICODERM CQ - dosed in mg/24 hours) patch 21 mg (has no administration in time range)  linezolid (ZYVOX) IVPB 600 mg (has no administration in time range)  morphine (PF) 4 MG/ML injection 4 mg (4 mg Intravenous Given 03/12/22 1122)  ondansetron (ZOFRAN) injection 4 mg (4 mg Intravenous Given 03/12/22 1121)     IMPRESSION / MDM / ASSESSMENT AND PLAN / ED COURSE  I reviewed the triage vital signs and the nursing notes.                              Differential diagnosis includes, but is not limited to, septic joint, effusion, osteomyelitis  Patient's presentation is most consistent with acute presentation with potential threat to life or bodily function.   Secure message was sent by Roland Rack, [name] from [facility], states that he aspirated the knee and got purulent pyogenic fluid.  States she will need a washout and antibiotics.  Secure message with Dr. Sharlet Salina for consult would like for [name] to reaspirate the knee and get cell count to ensure this is infected prior to admission.  Patient's initial labs show increased WBC of 13, remainder of her labs are reassuring at this time.  We did add a cell count and blood cultures after speaking with Edison Nasuti.  Dr. Kerman Passey in to see the patient to aspirate the knee   Aspiration of the knee showed a cloudy pustular type liquid.  Will go ahead and consult hospitalist for admission Dr. Blaine Hamper is admitting.  Patient's sed rate did come back as elevated also.  Patient is in agreement with admission.   FINAL CLINICAL IMPRESSION(S) / ED DIAGNOSES   Final diagnoses:  Pyogenic arthritis of right knee joint, due to unspecified organism [facility])     Rx / DC Orders   ED Discharge Orders     None        Note:  This document was prepared using Dragon voice recognition software and may include unintentional dictation errors.    Versie Starks,  [name] 03/12/22 1347    Harvest Dark, MD 03/12/22 [phone]

## 2022-03-12 NOTE — ED Notes (Signed)
Informed RN bed assigned 
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
## 2022-03-12 NOTE — ED Triage Notes (Signed)
Pt here with right knee pain. Pt states she has a pocket of fluid in her knee and it's painful. Pt in wheelchair in triage.

## 2022-03-12 NOTE — ED Provider Notes (Addendum)
-----------------------------------------   12:16 PM on 03/12/2022 ----------------------------------------- Patient was sent to the emergency department by orthopedics for a swollen right knee that will require an operation for a washout.  Dr. Sharlet Salina of orthopedics is aware of the patient.  He has contacted [name] to ask if we are able to aspirate fluid to send to the lab for culture, etc.  I will perform the knee aspiration.  I discussed the plan of care with the patient she is agreeable to admission.  Patient is lab work shows a mild leukocytosis 13,000, reassuring chemistry and a normal lactic acid.  X-ray shows moderate knee effusion.  I will perform the knee aspirate we will send for lab testing culture.  Patient will be admitted to hospital service for further workup and treatment.  Will cover with IV antibiotics.  Apiration of blood/fluid Performed by: Harvest Dark Verbal consent obtained. Required items: required blood products, implants, devices, and special equipment available Patient identity confirmed: verbally with patient Equipment, support staff and site/side marked as required. Preparation: Patient was prepped and draped in the usual sterile fashion. Patient tolerance: Patient tolerated the procedure well with no immediate complications.  Location of aspiration: Right knee  40 cc of purulent synovial fluid aspirated.  No complication.     Harvest Dark, MD 03/12/22 1232    Harvest Dark, MD 03/12/22 [phone]

## 2022-03-13 ENCOUNTER — Encounter
Admission: EM | Disposition: A | Discharge status: Home / Self Care | Payer: Self-pay | Source: Home / Self Care | Attending: Internal Medicine

## 2022-03-13 ENCOUNTER — Inpatient Hospital Stay: Payer: Medicare HMO

## 2022-03-13 ENCOUNTER — Other Ambulatory Visit: Payer: Self-pay

## 2022-03-13 ENCOUNTER — Inpatient Hospital Stay: Payer: Medicare HMO | Admitting: Anesthesiology

## 2022-03-13 ENCOUNTER — Encounter: Payer: Self-pay | Admitting: Radiology

## 2022-03-13 DIAGNOSIS — M009 Pyogenic arthritis, unspecified: Secondary | ICD-10-CM | POA: Diagnosis not present

## 2022-03-13 HISTORY — PX: KNEE ARTHROSCOPY: SHX127

## 2022-03-13 SURGERY — ARTHROSCOPY, KNEE
Anesthesia: General | Site: Knee | Laterality: Right

## 2022-03-13 MED ORDER — HYDROMORPHONE HCL 1 MG/ML IJ SOLN
1.0000 mg | Freq: Once | INTRAMUSCULAR | Status: AC
  Administered 2022-03-13: 1 mg via INTRAVENOUS
  Filled 2022-03-13: qty 1

## 2022-03-13 MED ORDER — LACTATED RINGERS IV SOLN
INTRAVENOUS | Status: DC | PRN
  Administered 2022-03-13: 5 mL

## 2022-03-13 MED ORDER — ONDANSETRON HCL 4 MG/2ML IJ SOLN
INTRAMUSCULAR | Status: AC
  Filled 2022-03-13: qty 2

## 2022-03-13 MED ORDER — ACETAMINOPHEN 10 MG/ML IV SOLN
1000.0000 mg | Freq: Once | INTRAVENOUS | Status: DC | PRN
  Administered 2022-03-13: 1000 mg via INTRAVENOUS

## 2022-03-13 MED ORDER — SODIUM CHLORIDE 0.9 % IV BOLUS
1000.0000 mL | Freq: Once | INTRAVENOUS | Status: AC
  Administered 2022-03-13: 1000 mL via INTRAVENOUS

## 2022-03-13 MED ORDER — ACETAMINOPHEN 500 MG PO TABS: 1000.0000 mg | ORAL_TABLET | Freq: Once | ORAL | Status: DC

## 2022-03-13 MED ORDER — FENTANYL CITRATE (PF) 100 MCG/2ML IJ SOLN
25.0000 ug | INTRAMUSCULAR | Status: DC | PRN
  Administered 2022-03-13: 25 ug via INTRAVENOUS
  Administered 2022-03-13: 50 ug via INTRAVENOUS

## 2022-03-13 MED ORDER — HYDROMORPHONE HCL 1 MG/ML IJ SOLN
0.5000 mg | INTRAMUSCULAR | Status: DC | PRN
  Administered 2022-03-13: 0.5 mg via INTRAVENOUS

## 2022-03-13 MED ORDER — LACTATED RINGERS IV SOLN: INTRAVENOUS | Status: DC | PRN

## 2022-03-13 MED ORDER — KETOROLAC TROMETHAMINE 30 MG/ML IJ SOLN
INTRAMUSCULAR | Status: AC
  Filled 2022-03-13: qty 1

## 2022-03-13 MED ORDER — PROMETHAZINE HCL 25 MG/ML IJ SOLN: 6.2500 mg | INTRAMUSCULAR | Status: DC | PRN

## 2022-03-13 MED ORDER — HYDROMORPHONE HCL 1 MG/ML IJ SOLN
INTRAMUSCULAR | Status: AC
  Filled 2022-03-13: qty 1

## 2022-03-13 MED ORDER — KETOROLAC TROMETHAMINE 30 MG/ML IJ SOLN
30.0000 mg | Freq: Three times a day (TID) | INTRAMUSCULAR | Status: DC | PRN
  Administered 2022-03-13 – 2022-03-15 (×4): 30 mg via INTRAVENOUS
  Filled 2022-03-13 (×4): qty 1

## 2022-03-13 MED ORDER — KETOROLAC TROMETHAMINE 30 MG/ML IJ SOLN
INTRAMUSCULAR | Status: DC | PRN
  Administered 2022-03-13: 30 mg via INTRAVENOUS

## 2022-03-13 MED ORDER — ONDANSETRON HCL 4 MG/2ML IJ SOLN
INTRAMUSCULAR | Status: DC | PRN
  Administered 2022-03-13: 4 mg via INTRAVENOUS

## 2022-03-13 MED ORDER — KETAMINE HCL 50 MG/5ML IJ SOSY
PREFILLED_SYRINGE | INTRAMUSCULAR | Status: AC
  Filled 2022-03-13: qty 5

## 2022-03-13 MED ORDER — RINGERS IRRIGATION IR SOLN
Status: DC | PRN
  Administered 2022-03-13 (×4): 3000 mL

## 2022-03-13 MED ORDER — OXYCODONE HCL 5 MG PO TABS: 5.0000 mg | ORAL_TABLET | Freq: Once | ORAL | Status: AC | PRN

## 2022-03-13 MED ORDER — DEXMEDETOMIDINE HCL IN NACL 200 MCG/50ML IV SOLN
INTRAVENOUS | Status: DC | PRN
  Administered 2022-03-13 (×2): 6 ug via INTRAVENOUS

## 2022-03-13 MED ORDER — PROPOFOL 10 MG/ML IV BOLUS
INTRAVENOUS | Status: DC | PRN
  Administered 2022-03-13: 120 mg via INTRAVENOUS

## 2022-03-13 MED ORDER — DROPERIDOL 2.5 MG/ML IJ SOLN: 0.6250 mg | Freq: Once | INTRAMUSCULAR | Status: DC | PRN

## 2022-03-13 MED ORDER — EPHEDRINE SULFATE (PRESSORS) 50 MG/ML IJ SOLN
INTRAMUSCULAR | Status: DC | PRN
  Administered 2022-03-13: 15 mg via INTRAVENOUS

## 2022-03-13 MED ORDER — HYDROMORPHONE HCL 1 MG/ML IJ SOLN
INTRAMUSCULAR | Status: DC | PRN
  Administered 2022-03-13 (×2): .5 mg via INTRAVENOUS

## 2022-03-13 MED ORDER — FENTANYL CITRATE (PF) 100 MCG/2ML IJ SOLN
INTRAMUSCULAR | Status: AC
  Administered 2022-03-13: 25 ug via INTRAVENOUS
  Filled 2022-03-13: qty 2

## 2022-03-13 MED ORDER — FENTANYL CITRATE (PF) 100 MCG/2ML IJ SOLN
INTRAMUSCULAR | Status: DC | PRN
  Administered 2022-03-13: 100 ug via INTRAVENOUS

## 2022-03-13 MED ORDER — ACETAMINOPHEN 10 MG/ML IV SOLN
INTRAVENOUS | Status: AC
  Filled 2022-03-13: qty 100

## 2022-03-13 MED ORDER — FENTANYL CITRATE (PF) 100 MCG/2ML IJ SOLN
INTRAMUSCULAR | Status: AC
  Filled 2022-03-13: qty 2

## 2022-03-13 MED ORDER — LIDOCAINE HCL (PF) 2 % IJ SOLN
INTRAMUSCULAR | Status: AC
  Filled 2022-03-13: qty 5

## 2022-03-13 MED ORDER — GABAPENTIN 300 MG PO CAPS
300.0000 mg | ORAL_CAPSULE | Freq: Once | ORAL | Status: DC
  Filled 2022-03-13: qty 1

## 2022-03-13 MED ORDER — OXYCODONE HCL 5 MG/5ML PO SOLN: 5.0000 mg | Freq: Once | ORAL | Status: AC | PRN

## 2022-03-13 MED ORDER — OXYCODONE HCL 5 MG PO TABS
ORAL_TABLET | ORAL | Status: AC
  Administered 2022-03-13: 5 mg via ORAL
  Filled 2022-03-13: qty 1

## 2022-03-13 MED ORDER — MIDAZOLAM HCL 2 MG/2ML IJ SOLN
INTRAMUSCULAR | Status: AC
  Filled 2022-03-13: qty 2

## 2022-03-13 MED ORDER — DIPHENHYDRAMINE HCL 50 MG/ML IJ SOLN
INTRAMUSCULAR | Status: AC
  Filled 2022-03-13: qty 1

## 2022-03-13 MED ORDER — 0.9 % SODIUM CHLORIDE (POUR BTL) OPTIME
TOPICAL | Status: DC | PRN
  Administered 2022-03-13: 1000 mL

## 2022-03-13 MED ORDER — DIPHENHYDRAMINE HCL 50 MG/ML IJ SOLN
INTRAMUSCULAR | Status: DC | PRN
  Administered 2022-03-13: 6.25 mg via INTRAVENOUS

## 2022-03-13 MED ORDER — MIDAZOLAM HCL 2 MG/2ML IJ SOLN
INTRAMUSCULAR | Status: DC | PRN
  Administered 2022-03-13: 2 mg via INTRAVENOUS

## 2022-03-13 MED ORDER — KETAMINE HCL 10 MG/ML IJ SOLN
INTRAMUSCULAR | Status: DC | PRN
  Administered 2022-03-13 (×2): 20 mg via INTRAVENOUS
  Administered 2022-03-13: 10 mg via INTRAVENOUS

## 2022-03-13 MED ORDER — PROPOFOL 10 MG/ML IV BOLUS
INTRAVENOUS | Status: AC
  Filled 2022-03-13: qty 20

## 2022-03-13 MED ORDER — LIDOCAINE HCL (CARDIAC) PF 100 MG/5ML IV SOSY
PREFILLED_SYRINGE | INTRAVENOUS | Status: DC | PRN
  Administered 2022-03-13: 100 mg via INTRAVENOUS

## 2022-03-13 SURGICAL SUPPLY — 45 items
ADAPTER IRRIG TUBE 2 SPIKE SOL (ADAPTER) ×1 IMPLANT
ADHESIVE MASTISOL STRL (MISCELLANEOUS) IMPLANT
BLADE FULL RADIUS 3.5 (BLADE) IMPLANT
BLADE INCISOR PLUS 4.5 (BLADE) ×1 IMPLANT
BLADE SHAVER 4.5 DBL SERAT CV (CUTTER) ×1 IMPLANT
BLADE SURG SZ11 CARB STEEL (BLADE) ×1 IMPLANT
BNDG ELASTIC 6X5.8 VLCR NS LF (GAUZE/BANDAGES/DRESSINGS) ×1 IMPLANT
BNDG ELASTIC 6X5.8 VLCR STR LF (GAUZE/BANDAGES/DRESSINGS) IMPLANT
BRUSH SCRUB EZ  4% CHG (MISCELLANEOUS) ×2
BRUSH SCRUB EZ 4% CHG (MISCELLANEOUS) ×2 IMPLANT
BULB RESERV EVAC DRAIN JP 100C (MISCELLANEOUS) IMPLANT
CHLORAPREP W/TINT 26 (MISCELLANEOUS) ×1 IMPLANT
DRAIN CHANNEL JP 15F RND 16 (MISCELLANEOUS) IMPLANT
DRAPE ARTHRO LIMB 89X125 STRL (DRAPES) ×1 IMPLANT
DRAPE IMP U-DRAPE 54X76 (DRAPES) ×1 IMPLANT
DRSG TEGADERM 4X4.75 (GAUZE/BANDAGES/DRESSINGS) IMPLANT
ELECT CAUTERY BLADE 6.4 (BLADE) IMPLANT
GAUZE SPONGE 4X4 12PLY STRL (GAUZE/BANDAGES/DRESSINGS) ×1 IMPLANT
GAUZE XEROFORM 1X8 LF (GAUZE/BANDAGES/DRESSINGS) ×1 IMPLANT
GLOVE BIO SURGEON STRL SZ7.5 (GLOVE) ×1 IMPLANT
GLOVE SURG UNDER POLY LF SZ7.5 (GLOVE) ×1 IMPLANT
GOWN STRL REUS W/ TWL LRG LVL3 (GOWN DISPOSABLE) ×1 IMPLANT
GOWN STRL REUS W/ TWL XL LVL3 (GOWN DISPOSABLE) ×1 IMPLANT
GOWN STRL REUS W/TWL LRG LVL3 (GOWN DISPOSABLE) ×1
GOWN STRL REUS W/TWL XL LVL3 (GOWN DISPOSABLE) ×1
IV LACTATED RINGER IRRG 3000ML (IV SOLUTION) ×2
IV LR IRRIG 3000ML ARTHROMATIC (IV SOLUTION) ×2 IMPLANT
KIT TURNOVER KIT A (KITS) ×1 IMPLANT
MANIFOLD NEPTUNE II (INSTRUMENTS) ×2 IMPLANT
MAT ABSORB  FLUID 56X50 GRAY (MISCELLANEOUS) ×1
MAT ABSORB FLUID 56X50 GRAY (MISCELLANEOUS) ×1 IMPLANT
PACK ARTHROSCOPY KNEE (MISCELLANEOUS) ×1 IMPLANT
PAD ABD DERMACEA PRESS 5X9 (GAUZE/BANDAGES/DRESSINGS) ×2 IMPLANT
PAD CAST 4YDX4 CTTN HI CHSV (CAST SUPPLIES) IMPLANT
PADDING CAST COTTON 4X4 STRL (CAST SUPPLIES) ×1
PENCIL SMOKE EVACUATOR COATED (MISCELLANEOUS) IMPLANT
SLEEVE REMOTE CONTROL 5X12 (DRAPES) IMPLANT
SPONGE DRAIN TRACH 4X4 STRL 2S (GAUZE/BANDAGES/DRESSINGS) IMPLANT
STRIP CLOSURE SKIN 1/2X4 (GAUZE/BANDAGES/DRESSINGS) ×1 IMPLANT
SUT MNCRL AB 3-0 PS2 27 (SUTURE) ×1 IMPLANT
SUT MON AB 2-0 CT1 36 (SUTURE) IMPLANT
TUBE SET DOUBLEFLO INFLOW (TUBING) ×1 IMPLANT
TUBING OUTFLOW SET DBLFO PUMP (TUBING) IMPLANT
WAND WEREWOLF FLOW 90D (MISCELLANEOUS) ×1 IMPLANT
WATER STERILE IRR 500ML POUR (IV SOLUTION) ×1 IMPLANT

## 2022-03-13 NOTE — Hospital Course (Addendum)
Taken from H&P.   [name] is a 47 y.o. female with medical history significant of asthma, depression with anxiety, anemia, stress incontinence, foot osteomyelitis, who presents with pain and swelling in right knee and right lower leg.   Patient states that she has pain in the right knee and the right lower leg for more than a week.    She was seen at [facility] outpatient clinic underwent knee aspiration of thick purulent fluid and referred to the ER.  Repeat aspiration was obtained in the ER with 170+K cells, no crystals seen.  Data reviewed independently and ED Course: pt was found to have WBC 13.7, potassium 3.4, renal function okay, temperature 99.2, blood pressure 101/68, heart rate 85, RR 20, oxygen saturation 100% on room air.  She was started on ceftriaxone and Zyvox.  Patient has a recent right lower extremity venous Doppler which was negative for DVT but did show a fluid collection along with prominent right groin lymph node.  Orthopedic was consulted and patient will be going for joint washout in the morning.  2/24: Vitals with softer blood pressure at 83/60.  Recent wound culture from right foot with group G Streptococcus, knee aspirate cultures with no organisms so far.  Underwent joint aspiration today.  Preliminary blood cultures negative.  CRP elevated at 7.  MRI of right lower extremity with extensive subcutaneous edema, most compatible with cellulitis.  Negative for osteomyelitis.  Fluid collection appears to be a large dissecting Baker's cyst.

## 2022-03-13 NOTE — Transfer of Care (Signed)
Immediate Anesthesia Transfer of Care Note  Patient: [name]  Procedure(s) Performed: ARTHROSCOPIC [facility] OUT RIGHT KNEE (Right: Knee)  Patient Location: PACU  Anesthesia Type:General  Level of Consciousness: drowsy and patient cooperative  Airway & Oxygen Therapy: Patient Spontanous Breathing and Patient connected to nasal cannula oxygen  Post-op Assessment: Report given to RN and Post -op Vital signs reviewed and stable  Post vital signs: Reviewed and stable  Last Vitals:  Vitals Value Taken Time  BP    Temp    Pulse 85 03/13/22 0923  Resp 17 03/13/22 0923  SpO2 100 % 03/13/22 0923  Vitals shown include unvalidated device data.  Last Pain:  Vitals:   03/13/22 0523  TempSrc:   PainSc: 4       Patients Stated Pain Goal: 0 ([id number])  Complications: No notable events documented.
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
## 2022-03-13 NOTE — Assessment & Plan Note (Signed)
Potassium at 3.4 with magnesium of 2.2 -Replete potassium and monitor

## 2022-03-13 NOTE — Assessment & Plan Note (Signed)
Stable -Bronchodilators 

## 2022-03-13 NOTE — OR Nursing (Signed)
Dr. Wynetta Emery notified of previous UDS + for cocaine, denies need for UDS at this time.

## 2022-03-13 NOTE — Anesthesia Postprocedure Evaluation (Signed)
Anesthesia Post Note  Patient: [name]  Procedure(s) Performed: ARTHROSCOPIC [facility] OUT RIGHT KNEE (Right: Knee)  Patient location during evaluation: PACU Anesthesia Type: General Level of consciousness: awake and alert Pain management: pain level controlled Vital Signs Assessment: post-procedure vital signs reviewed and stable Respiratory status: spontaneous breathing, nonlabored ventilation and respiratory function stable Cardiovascular status: blood pressure returned to baseline and stable Postop Assessment: no apparent nausea or vomiting Anesthetic complications: no   No notable events documented.   Last Vitals:  Vitals:   03/13/22 1015 03/13/22 1030  BP: 99/64 101/71  Pulse: 82 68  Resp: 12 16  Temp:  37.1 C  SpO2: 97% 94%    Last Pain:  Vitals:   03/13/22 1018  TempSrc:   PainSc: Asleep                 [place] Ouch

## 2022-03-13 NOTE — Anesthesia Procedure Notes (Addendum)
Procedure Name: LMA Insertion Date/Time: 03/13/2022 7:47 AM  Performed by: Sherrine Maples, CRNAPre-anesthesia Checklist: Patient identified, Patient being monitored, Timeout performed, Emergency Drugs available and Suction available Patient Re-evaluated:Patient Re-evaluated prior to induction Oxygen Delivery Method: Circle system utilized Preoxygenation: Pre-oxygenation with 100% oxygen Induction Type: IV induction Ventilation: Mask ventilation without difficulty LMA: LMA inserted LMA Size: 4.0 Tube type: Oral Number of attempts: 1 Placement Confirmation: positive ETCO2 and breath sounds checked- equal and bilateral Tube secured with: Tape Dental Injury: Teeth and Oropharynx as per pre-operative assessment

## 2022-03-13 NOTE — Plan of Care (Signed)

## 2022-03-13 NOTE — Progress Notes (Signed)
Progress Note   Patient: [name] [id number] DOB: 1975-07-10 DOA: 03/12/2022     1 DOS: the patient was seen and examined on 03/13/2022   Brief hospital course: Taken from H&P.   [name] is a 47 y.o. female with medical history significant of asthma, depression with anxiety, anemia, stress incontinence, foot osteomyelitis, who presents with pain and swelling in right knee and right lower leg.   Patient states that she has pain in the right knee and the right lower leg for more than a week.    She was seen at [facility] outpatient clinic underwent knee aspiration of thick purulent fluid and referred to the ER.  Repeat aspiration was obtained in the ER with 170+K cells, no crystals seen.  Data reviewed independently and ED Course: pt was found to have WBC 13.7, potassium 3.4, renal function okay, temperature 99.2, blood pressure 101/68, heart rate 85, RR 20, oxygen saturation 100% on room air.  She was started on ceftriaxone and Zyvox.  Patient has a recent right lower extremity venous Doppler which was negative for DVT but did show a fluid collection along with prominent right groin lymph node.  Orthopedic was consulted and patient will be going for joint washout in the morning.  2/24: Vitals with softer blood pressure at 83/60.  Recent wound culture from right foot with group G Streptococcus, knee aspirate cultures with no organisms so far.  Underwent joint aspiration today.  Preliminary blood cultures negative.  CRP elevated at 7.  MRI of right lower extremity with extensive subcutaneous edema, most compatible with cellulitis.  Negative for osteomyelitis.  Fluid collection appears to be a large dissecting Baker's cyst.         Assessment and Plan: * Septic joint of right knee joint ([place]) S/p joint irrigation by orthopedic surgery.  Preliminary blood cultures negative MRI is negative for osteomyelitis and lower extremity. -Continue with Rocephin and  linezolid -Follow-up cultures -Continue with pain management  Asthma  Stable -Bronchodilators  Anxiety and depression -Continue home medications  Hypokalemia Potassium at 3.4 with magnesium of 2.2 -Replete potassium and monitor  Tobacco abuse -Nicotine patch  Cellulitis of right lower extremity MRI negative for any osteomyelitis.  Fluid collection consistent with dissecting Baker's cyst. -On broad-spectrum antibiotics   Subjective: Patient was seen in PACU after right knee irrigation and washout.  Complaining of 10 out of 10 pain.  Physical Exam: Vitals:   03/13/22 0945 03/13/22 1000 03/13/22 1015 03/13/22 1030  BP: 110/80 105/73 99/64 101/71  Pulse: 76 78 82 68  Resp: '12 15 12 16  '$ Temp: (!) 97.5 F (36.4 C) (!) 97.3 F (36.3 C)  98.7 F (37.1 C)  TempSrc:      SpO2: 99% 100% 97% 94%  Weight:      Height:       General.  Well-developed lady, in no acute distress. Pulmonary.  Lungs clear bilaterally, normal respiratory effort. CV.  Regular rate and rhythm, no JVD, rub or murmur. Abdomen.  Soft, nontender, nondistended, BS positive. CNS.  Alert and oriented .  No focal neurologic deficit. Extremities.  Right lower extremity with edema and erythema, right knee with Ace wrap and drainage. Psychiatry.  Judgment and insight appears normal.   Data Reviewed: Prior data reviewed  Family Communication: Discussed with patient  Disposition: Status is: Inpatient Remains inpatient appropriate because: Severity of illness  Planned Discharge Destination: Home   Time spent: 45 minutes  This record has been created using Dragon voice  recognition software. Errors have been sought and corrected,but may not always be located. Such creation errors do not reflect on the standard of care.   Author: Lorella Nimrod, MD 03/13/2022 2:10 PM  For on call review [email or web address].

## 2022-03-13 NOTE — Assessment & Plan Note (Signed)
Continue home medications. 

## 2022-03-13 NOTE — Op Note (Signed)
DATE OF SURGERY:  03/13/2022  9:38 AM  PATIENT:  [name]   MRN: [id number]   PRE-OPERATIVE DIAGNOSIS: Right knee septic arthritis, posterior abscess versus Baker's cyst   POST-OPERATIVE DIAGNOSIS: Same   PROCEDURE:  Procedure(s): Right knee arthroscopic irrigation and debridement, decompression/irrigation and debridement of posterior proximal tibial abscess    PREOPERATIVE INDICATIONS:     [name]  is an 47 y.o. year old who was admitted with a diagnosis of right knee septic arthritis.   The risks benefits and alternatives were discussed with the patient and their family including but not limited to the risks of  infection, pain, bleeding, neurovascular damage, etc. Medical risks include but are not limited to DVT and pulmonary embolism, myocardial infarction, stroke, pneumonia, respiratory failure and death.   OPERATIVE REPORT     SURGEON:  Renee Harder    ANESTHESIA: General  EBL:  50 cc    COMPLICATIONS:  None.    SPECIMEN: Synovial fluid culture       PROCEDURE IN DETAIL:    The patient was met in the holding area and  identified.  The appropriate extremity was identified and marked at the operative site after verbally confirming with the patient that this was the correct site of surgery.  The patient was then transported to the OR  and  underwent general anesthesia.    The operative lower extremity was prepped and draped in a sterile fashion.  A time out was performed prior to incision to verify patient's name, date of birth, medical record number, correct site of surgery correct procedure to be performed.     Standard medial and lateral arthroscopic portals were made.  Arthroscope was inserted and the joint was insufflated with saline.  A superomedial outflow portal was also created through the VMO.  There is thick purulence throughout the joint.  [name] was utilized to debride synovium and remove purulent tissue.  There was grade 2  patellofemoral chondromalacia, intact medial and lateral compartment chondral surfaces, intact menisci, intact ACL and PCL.  Thorough debridement was performed in the suprapatellar pouch, medial and lateral gutter, retropatellar fat pad.  Approximately 6 to 9 L of irrigation fluid was flushed through the knee joint.  A 15 [place] JP drain was placed at the conclusion of the knee arthroscopy.  Next, 3 cm incision was made along the anterior medial proximal tibia.  Dissection was carried down to the tibia and elevator was used posterior medially to release the fascia off the posterior medial tibia which extended into a large abscess which was decompressed with egress of purulent fluid.  This was decompressed bluntly and irrigated with a syringe and Angiocath.  Approximately 2-3L of fluid was irrigated posteromedially.  A JP drain was then placed.  Wounds were irrigated and closed with Monocryl suture.  Sterile dressing was applied.   The patient was then transferred to a hospital bed and brought to the PACU in stable condition. I was scrubbed and present the entire case and all sharp and instrument counts were correct at the conclusion of the case. I spoke with the patient's family to let them know the case was completed without complication patient was stable in recovery room.     Postoperative plan: Patient will continue on broad-spectrum IV antibiotics and will narrow based on culture data.  Continue to monitor drain output.  Drain #1 from the knee joint, drain #2 from the posterior abscess.  PT to work on ambulation.  Renee Harder, MD Orthopedic Surgeon

## 2022-03-13 NOTE — Assessment & Plan Note (Signed)
S/p joint irrigation by orthopedic surgery.  Preliminary blood cultures negative MRI is negative for osteomyelitis and lower extremity. -Continue with Rocephin and linezolid -Follow-up cultures -Continue with pain management

## 2022-03-13 NOTE — Assessment & Plan Note (Signed)
MRI negative for any osteomyelitis.  Fluid collection consistent with dissecting Baker's cyst. -On broad-spectrum antibiotics

## 2022-03-13 NOTE — Assessment & Plan Note (Signed)
-  Nicotine patch 

## 2022-03-14 ENCOUNTER — Encounter: Payer: Self-pay | Admitting: Orthopaedic Surgery

## 2022-03-14 DIAGNOSIS — M009 Pyogenic arthritis, unspecified: Secondary | ICD-10-CM | POA: Diagnosis not present

## 2022-03-14 LAB — CBC
HCT: 33.7 % — ABNORMAL LOW (ref 36.0–46.0)
Hemoglobin: 10.6 g/dL — ABNORMAL LOW (ref 12.0–15.0)
MCH: 29 pg (ref 26.0–34.0)
MCHC: 31.5 g/dL (ref 30.0–36.0)
MCV: 92.3 fL (ref 80.0–100.0)
Platelets: 431 10*3/uL — ABNORMAL HIGH (ref 150–400)
RBC: 3.65 MIL/uL — ABNORMAL LOW (ref 3.87–5.11)
RDW: 14.3 % (ref 11.5–15.5)
WBC: 9.7 10*3/uL (ref 4.0–10.5)
nRBC: 0 % (ref 0.0–0.2)

## 2022-03-14 LAB — BASIC METABOLIC PANEL
Anion gap: 6 (ref 5–15)
BUN: 6 mg/dL (ref 6–20)
CO2: 25 mmol/L (ref 22–32)
Calcium: 7.8 mg/dL — ABNORMAL LOW (ref 8.9–10.3)
Chloride: 104 mmol/L (ref 98–111)
Creatinine, Ser: 0.42 mg/dL — ABNORMAL LOW (ref 0.44–1.00)
GFR, Estimated: >60 mL/min (ref >60)
Glucose, Bld: 102 mg/dL — ABNORMAL HIGH (ref 70–99)
Potassium: 3.7 mmol/L (ref 3.5–5.1)
Sodium: 135 mmol/L (ref 135–145)

## 2022-03-14 LAB — BODY FLUID CULTURE W GRAM STAIN

## 2022-03-14 MED ORDER — ASPIRIN 325 MG PO TABS
325.0000 mg | ORAL_TABLET | Freq: Every day | ORAL | Status: DC
  Administered 2022-03-14 – 2022-03-15 (×2): 325 mg via ORAL
  Filled 2022-03-14 (×2): qty 1

## 2022-03-14 MED ORDER — SODIUM CHLORIDE 0.9 % IV BOLUS
1000.0000 mL | Freq: Once | INTRAVENOUS | Status: AC
  Administered 2022-03-14: 1000 mL via INTRAVENOUS

## 2022-03-14 NOTE — Progress Notes (Signed)
PT Cancellation Note  Patient Details Name: [name] MRN: [id number] DOB: 1975-03-26   Cancelled Treatment:    Reason Eval/Treat Not Completed: Fatigue/lethargy limiting ability to participate. PT attempted to see pt x3 today. First attempt this morning pt had a visitor and requested that PT return at a later time. PT then came back a couple of hours later and pt was asleep in bed. Pt awoke to PT entering room; however, she was very fatigued and lethargic. Pt stated she recently took some pain medicine and requested PT return at a later time. PT then came back a third time about an hour later and pt remains soundly asleep in bed and did not awaken to auditory stimuli with PT entering room. PT will continue to follow-up with pt acutely as available and appropriate.    [place] 03/14/2022, 2:11 PM

## 2022-03-14 NOTE — Assessment & Plan Note (Signed)
MRI negative for any osteomyelitis.  Fluid collection consistent with dissecting Baker's cyst which has been surgically drained during the procedure yesterday. -On broad-spectrum antibiotics

## 2022-03-14 NOTE — Assessment & Plan Note (Signed)
S/p joint irrigation by orthopedic surgery.  Preliminary blood cultures negative MRI is negative for osteomyelitis and lower extremity.  Recent blood cultures during I&D yesterday are negative so far, cultures from orthopedic surgery office with group G Streptococcus.  Linezolid was discontinued. -Continue with Rocephin while in the hospital -Follow-up cultures -Continue with pain management

## 2022-03-14 NOTE — Plan of Care (Signed)

## 2022-03-14 NOTE — Progress Notes (Signed)
Subjective:  Patient reports pain as moderate.  She is resting comfortably in bed.  Objective:   VITALS:   Vitals:   03/13/22 2020 03/14/22 0033 03/14/22 0408 03/14/22 0749  BP: 97/61 108/64 (!) 94/59 (!) 93/56  Pulse: 85 89 80 89  Resp: '18 16 16 16  '$ Temp: 99.2 F (37.3 C) 98.9 F (37.2 C) 98.6 F (37 C) 98.2 F (36.8 C)  TempSrc:      SpO2: 100% 98% 95% 100%  Weight:      Height:        PHYSICAL EXAM:  General: Comfortable Right lower extremity: Dressing is clean and intact.  Drains are holding suction.  Right lower extremity swelling is improved.  Grossly motor and sensory intact  LABS  Results for orders placed or performed during the hospital encounter of 03/12/22 (from the past 24 hour(s))  Aerobic/Anaerobic Culture w Gram Stain (surgical/deep wound)     Status: None (Preliminary result)   Collection Time: 03/13/22  8:22 AM   Specimen: Synovial, Right Knee; Body Fluid  Result Value Ref Range   Specimen Description      FLUID Performed at [facility], [place] [address] [place], [place] [address]    Special Requests      RIGHT KNEE SYNOVIAL FLUID Performed at [facility], [place], [place] [address]    Gram Stain      ABUNDANT WBC PRESENT, PREDOMINANTLY PMN NO ORGANISMS SEEN Performed at [facility], [place] [address], [place], [place] [address]    Culture PENDING    Report Status PENDING   CBC     Status: Abnormal   Collection Time: 03/14/22  6:15 AM  Result Value Ref Range   WBC 9.7 4.0 - 10.5 K/uL   RBC 3.65 (L) 3.87 - 5.11 MIL/uL   Hemoglobin 10.6 (L) 12.0 - 15.0 g/dL   HCT 33.7 (L) 36.0 - 46.0 %   MCV 92.3 80.0 - 100.0 fL   MCH 29.0 26.0 - 34.0 pg   MCHC 31.5 30.0 - 36.0 g/dL   RDW 14.3 11.5 - 15.5 %   Platelets 431 (H) 150 - 400 K/uL   nRBC 0.0 0.0 - 0.2 %  Basic metabolic panel     Status: Abnormal   Collection Time: 03/14/22  6:15 AM  Result Value Ref Range   Sodium 135 135 - 145 mmol/L   Potassium 3.7  3.5 - 5.1 mmol/L   Chloride 104 98 - 111 mmol/L   CO2 25 22 - 32 mmol/L   Glucose, Bld 102 (H) 70 - 99 mg/dL   BUN 6 6 - 20 mg/dL   Creatinine, Ser 0.42 (L) 0.44 - 1.00 mg/dL   Calcium 7.8 (L) 8.9 - 10.3 mg/dL   GFR, Estimated >60 >60 mL/min   Anion gap 6 5 - 15    MR TIBIA FIBULA RIGHT WO CONTRAST  Result Date: 03/13/2022 CLINICAL DATA:  Right lower leg pain and swelling. Possible infection. EXAM: MRI OF LOWER RIGHT EXTREMITY WITHOUT CONTRAST TECHNIQUE: Multiplanar, multisequence MR imaging of the right lower leg was performed. No intravenous contrast was administered. COMPARISON:  None Available. FINDINGS: Bones/Joint/Cartilage The coronal images incidentally include the left lower leg. No marrow signal abnormality to suggest osteomyelitis, fracture or stress change is identified. No focal marrow lesion. Ligaments Intact. Muscles and Tendons There is severe fatty atrophy of lower leg musculature bilaterally. No tear or strain is identified. Fluid collection interposed between the medial gastrocnemius and  soleus on the right measures approximately 15 cm craniocaudal by 6.5 cm transverse by 1.9 cm AP. The collection extends above the superior margin of the scan and appears to be a dissecting Baker's cyst. Soft tissues Extensive subcutaneous edema is present about the lower leg on right. IMPRESSION: Extensive subcutaneous edema about the right lower leg is most compatible with cellulitis. Negative for osteomyelitis. Fluid collection extending above the superior margin of the scan appears to be a large dissecting Baker's cyst. Severe fatty atrophy of lower leg musculature bilaterally. Electronically Signed   By: Inge Rise M.D.   On: 03/13/2022 09:15   DG Knee Complete 4 Views Right  Result Date: 03/12/2022 CLINICAL DATA:  Pain and swelling EXAM: RIGHT KNEE - COMPLETE 4+ VIEW COMPARISON:  02/12/2020 FINDINGS: No fracture or dislocation. Moderate knee joint effusion. Joint spaces are maintained.  IMPRESSION: No fracture or dislocation. Moderate knee joint effusion. Electronically Signed   By: Miachel Roux M.D.   On: 03/12/2022 11:23    Assessment/Plan: 1 Day Post-Op   Principal Problem:   Septic joint of right knee joint (HCC) Active Problems:   Anxiety and depression   Asthma   Hypokalemia   Tobacco abuse   Cellulitis of right lower extremity  Status post right knee arthroscopic I&D, open Baker's cyst decompression  -Appreciate hospitalist team management -Continue broad-spectrum antibiotics and narrow based on culture data which is still pending -Will continue to monitor drain output and clinical improvement -PT/OT: Weight-bear as tolerated -DVT prophylaxis aspirin 325 mg daily   Renee Harder , MD 03/14/2022, 8:16 AM

## 2022-03-14 NOTE — Progress Notes (Signed)
Progress Note   Patient: [name] [id number] DOB: March 01, 1975 DOA: 03/12/2022     2 DOS: the patient was seen and examined on 03/14/2022   Brief hospital course: Taken from H&P.   [name] is a 47 y.o. female with medical history significant of asthma, depression with anxiety, anemia, stress incontinence, foot osteomyelitis, who presents with pain and swelling in right knee and right lower leg.   Patient states that she has pain in the right knee and the right lower leg for more than a week.    She was seen at [facility] outpatient clinic underwent knee aspiration of thick purulent fluid and referred to the ER.  Repeat aspiration was obtained in the ER with 170+K cells, no crystals seen.  Data reviewed independently and ED Course: pt was found to have WBC 13.7, potassium 3.4, renal function okay, temperature 99.2, blood pressure 101/68, heart rate 85, RR 20, oxygen saturation 100% on room air.  She was started on ceftriaxone and Zyvox.  Patient has a recent right lower extremity venous Doppler which was negative for DVT but did show a fluid collection along with prominent right groin lymph node.  MRI of right lower extremity with extensive subcutaneous edema, most compatible with cellulitis.  Negative for osteomyelitis.  Fluid collection appears to be a large dissecting Baker's cyst.  Orthopedic was consulted and patient will be going for joint washout in the morning.  2/24: Vitals with softer blood pressure at 83/60.  Recent wound culture from right foot with group G Streptococcus, knee aspirate cultures with no organisms so far.  Underwent joint arthroscopic I&D and open Baker's cyst decompression.  Preliminary blood cultures negative.  CRP elevated at 7.   2/25: Hemodynamically stable with borderline blood pressure at 93/56.  Synovial fluid from outpatient joint aspiration with rare Streptococcus group G, repeat cultures from yesterday's procedure still pending.   Blood cultures remain negative.  CBC with resolution of leukocytosis, slight decrease in hemoglobin to 10.6 after the surgery, all cell lines decreased so some dilutional effect.  BMP stable.  Discontinuing linezolid and we will continue ceftriaxone for now.  She can be discharged on cefpodoxime 400 mg twice daily, based on her allergies and her generation of cephalosporin being very effective for group G strep, discussed with pharmacy.      Assessment and Plan: * Septic joint of right knee joint ([place]) S/p joint irrigation by orthopedic surgery.  Preliminary blood cultures negative MRI is negative for osteomyelitis and lower extremity.  Recent blood cultures during I&D yesterday are negative so far, cultures from orthopedic surgery office with group G Streptococcus.  Linezolid was discontinued. -Continue with Rocephin while in the hospital -Follow-up cultures -Continue with pain management  Asthma  Stable -Bronchodilators  Anxiety and depression -Continue home medications  Hypokalemia Resolved.   Tobacco abuse -Nicotine patch  Cellulitis of right lower extremity MRI negative for any osteomyelitis.  Fluid collection consistent with dissecting Baker's cyst which has been surgically drained during the procedure yesterday. -On broad-spectrum antibiotics   Subjective: Patient with some improvement of pain and lower extremity erythema.  Physical Exam: Vitals:   03/14/22 0033 03/14/22 0408 03/14/22 0749 03/14/22 1254  BP: 108/64 (!) 94/59 (!) 93/56 (!) 88/59  Pulse: 89 80 89 77  Resp: '16 16 16 16  '$ Temp: 98.9 F (37.2 C) 98.6 F (37 C) 98.2 F (36.8 C) 98.1 F (36.7 C)  TempSrc:      SpO2: 98% 95% 100% 98%  Weight:  Height:       General.  Well-developed lady, in no acute distress. Pulmonary.  Lungs clear bilaterally, normal respiratory effort. CV.  Regular rate and rhythm, no JVD, rub or murmur. Abdomen.  Soft, nontender, nondistended, BS positive. CNS.  Alert and  oriented .  No focal neurologic deficit. Extremities. RLE with mild erythema and right knee with Ace wrap. Psychiatry.  Judgment and insight appears normal.    Data Reviewed: Prior data reviewed  Family Communication: Discussed with patient  Disposition: Status is: Inpatient Remains inpatient appropriate because: Severity of illness  Planned Discharge Destination: Home   Time spent: 44 minutes  This record has been created using Systems analyst. Errors have been sought and corrected,but may not always be located. Such creation errors do not reflect on the standard of care.   Author: Lorella Nimrod, MD 03/14/2022 3:26 PM  For on call review [email or web address].

## 2022-03-14 NOTE — Assessment & Plan Note (Signed)
Resolved

## 2022-03-15 ENCOUNTER — Telehealth: Payer: Self-pay | Admitting: Family Medicine

## 2022-03-15 ENCOUNTER — Ambulatory Visit: Payer: Medicare PPO | Admitting: Psychology

## 2022-03-15 DIAGNOSIS — L03115 Cellulitis of right lower limb: Secondary | ICD-10-CM

## 2022-03-15 DIAGNOSIS — Z720 Tobacco use: Secondary | ICD-10-CM | POA: Diagnosis not present

## 2022-03-15 DIAGNOSIS — M00261 Other streptococcal arthritis, right knee: Secondary | ICD-10-CM | POA: Diagnosis not present

## 2022-03-15 DIAGNOSIS — E876 Hypokalemia: Secondary | ICD-10-CM | POA: Diagnosis not present

## 2022-03-15 DIAGNOSIS — F419 Anxiety disorder, unspecified: Secondary | ICD-10-CM | POA: Diagnosis not present

## 2022-03-15 LAB — PROTEIN, BODY FLUID (OTHER): Total Protein, Body Fluid Other: 4.8 g/dL

## 2022-03-15 LAB — GLUCOSE, CAPILLARY: Glucose-Capillary: 154 mg/dL — ABNORMAL HIGH (ref 70–99)

## 2022-03-15 LAB — GLUCOSE, BODY FLUID OTHER: Glucose, Body Fluid Other: <2 mg/dL

## 2022-03-15 MED ORDER — ASPIRIN 325 MG PO TABS: 325.0000 mg | ORAL_TABLET | Freq: Every day | ORAL | 0 refills | Status: AC

## 2022-03-15 MED ORDER — CEFPODOXIME PROXETIL 200 MG PO TABS: 400.0000 mg | ORAL_TABLET | Freq: Two times a day (BID) | ORAL | 0 refills | Status: AC

## 2022-03-15 MED ORDER — OXYCODONE-ACETAMINOPHEN 5-325 MG PO TABS: 1.0000 | ORAL_TABLET | ORAL | 0 refills | Status: DC | PRN

## 2022-03-15 MED ORDER — NAPROXEN 500 MG PO TABS: 500.0000 mg | ORAL_TABLET | Freq: Two times a day (BID) | ORAL | 2 refills | Status: AC

## 2022-03-15 NOTE — Progress Notes (Signed)
Discharge instructions reviewed with patient including followup visits and new medications.  Understanding was verbalized and all questions were answered.  IV removed without complication; patient tolerated well.  Patient discharged home via wheelchair in stable condition escorted by nursing staff.

## 2022-03-15 NOTE — Progress Notes (Signed)
Patient is not able to walk the distance required to go the bathroom, or he/she is unable to safely negotiate stairs required to access the bathroom.  A 3in1 BSC will alleviate this problem  

## 2022-03-15 NOTE — Telephone Encounter (Signed)
Nurse from [facility] called in to booked a hospital f/u for pt been discharged today. Provider its out of the office this week. Pt its booked for 3/12 '@11am'$ .

## 2022-03-15 NOTE — Progress Notes (Signed)
RHYLIE, STUTE ([id number]) [id number] Page 1 of 8 Visit Report for 03/09/2022 Arrival Information Details Patient Name: Date of Service: [name]. 03/09/2022 9:15 A [name]: [id number] Patient Account Number: [id number] Date of Birth/Sex: Treating RN: 11/25/1975 (47 y.o. [name] Primary Care [name]: [name] Other Clinician: Massie [name] Referring [name]: Treating [name]/Extender: [name] in Treatment: 28 Visit Information History Since Last Visit All ordered tests and consults were completed: No Patient Arrived: Ambulatory Added or deleted any medications: No Arrival Time: 09:22 Any new allergies or adverse reactions: No Transfer Assistance: None Had a fall or experienced change in No Patient Identification Verified: Yes activities of daily living that may affect Secondary Verification Process Completed: Yes risk of falls: Patient Requires Transmission-Based Precautions: No Signs or symptoms of abuse/neglect since last visito No Patient Has Alerts: Yes Hospitalized since last visit: No Patient Alerts: ABI R 1.15 02/04/21 Implantable device outside of the clinic excluding No ABI L 1.11 02/04/21 cellular tissue based products placed in the center since last visit: Has Dressing in Place as Prescribed: Yes Pain Present Now: Yes Electronic Signature(s) Signed: 03/15/2022 9:50:02 AM By: [name] Entered By: [name] on 03/09/2022 09:27:39 -------------------------------------------------------------------------------- Clinic Level of Care Assessment Details Patient Name: Date of Service: [name]. 03/09/2022 9:15 A [name]: [id number] Patient Account Number: [id number] Date of Birth/Sex: Treating RN: 07-26-1975 (47 y.o. [name] Primary Care [name]: [name] Other Clinician: Massie [name] Referring [name]: Treating  [name]/Extender: [name] in Treatment: 28 Clinic Level of Care Assessment Items TOOL 1 Quantity Score '[]'$  - 0 Use when EandM and Procedure is performed on INITIAL visit ASSESSMENTS - Nursing Assessment / Reassessment '[]'$  - 0 General Physical Exam (combine w/ comprehensive assessment (listed just below) when performed on new pt. evals) '[]'$  - 0 Comprehensive Assessment (HX, ROS, Risk Assessments, Wounds Hx, etc.) [name], [name] ([id number]) [id number] Page 2 of 8 ASSESSMENTS - Wound and Skin Assessment / Reassessment '[]'$  - 0 Dermatologic / Skin Assessment (not related to wound area) ASSESSMENTS - Ostomy and/or Continence Assessment and Care '[]'$  - 0 Incontinence Assessment and Management '[]'$  - 0 Ostomy Care Assessment and Management (repouching, etc.) PROCESS - Coordination of Care '[]'$  - 0 Simple Patient / Family Education for ongoing care '[]'$  - 0 Complex (extensive) Patient / Family Education for ongoing care '[]'$  - 0 Staff obtains Programmer, systems, Records, T Results / Process Orders est '[]'$  - 0 Staff telephones HHA, Nursing Homes / Clarify orders / etc '[]'$  - 0 Routine Transfer to another Facility (non-emergent condition) '[]'$  - 0 Routine Hospital Admission (non-emergent condition) '[]'$  - 0 New Admissions / Biomedical engineer / Ordering NPWT Apligraf, etc. , '[]'$  - 0 Emergency Hospital Admission (emergent condition) PROCESS - Special Needs '[]'$  - 0 Pediatric / Minor Patient Management '[]'$  - 0 Isolation Patient Management '[]'$  - 0 Hearing / Language / Visual special needs '[]'$  - 0 Assessment of Community assistance (transportation, D/C planning, etc.) '[]'$  - 0 Additional assistance / Altered mentation '[]'$  - 0 Support Surface(s) Assessment (bed, cushion, seat, etc.) INTERVENTIONS - Miscellaneous '[]'$  - 0 External ear exam '[]'$  - 0 Patient Transfer (multiple staff / Civil Service fast streamer / Similar devices) '[]'$  - 0 Simple Staple / Suture removal (25  or less) '[]'$  - 0 Complex Staple / Suture removal (26 or more) '[]'$  - 0 Hypo/Hyperglycemic Management (do not check if billed separately) '[]'$  - 0 Ankle /  Brachial Index (ABI) - do not check if billed separately Has the patient been seen at the hospital within the last three years: Yes Total Score: 0 Level Of Care: ____ Electronic Signature(s) Signed: 03/15/2022 9:50:02 AM By: [name] Entered By: [name] on 03/09/2022 10:07:32 -------------------------------------------------------------------------------- Encounter Discharge Information Details Patient Name: Date of Service: [name], Kelly Mons. 03/09/2022 9:15 A [name]: [id number] Patient Account Number: [id number] Date of Birth/Sex: Treating RN: January 25, 1975 (47 y.o. y.o. [name] Primary Care [name]: [name] Other Clinician: Massie [name] Referring [name]: Treating [name]/Extender: [name], [name] [name] ([id number]) [id number] Page 3 of 8 Weeks in Treatment: 28 Encounter Discharge Information Items Post Procedure Vitals Discharge Condition: Stable Temperature (F): 98.5 Ambulatory Status: Walker Pulse (bpm): 92 Discharge Destination: Home Respiratory Rate (breaths/min): 16 Transportation: Private Auto Blood Pressure (mmHg): 119/73 Accompanied By: self Schedule Follow-up Appointment: Yes Clinical Summary of Care: Electronic Signature(s) Signed: 03/15/2022 9:50:02 AM By: [name] Entered By: [name] on 03/09/2022 10:08:19 -------------------------------------------------------------------------------- Lower Extremity Assessment Details Patient Name: Date of Service: [name]. 03/09/2022 9:15 A [name]: [id number] Patient Account Number: [id number] Date of Birth/Sex: Treating RN: 1975-04-23 (47 y.o. [name] Primary Care [name]: [name] Other Clinician: Massie [name] Referring  [name]: Treating [name]/Extender: [name] in Treatment: 28 Vascular Assessment Pulses: Dorsalis Pedis Palpable: [Right:Yes] Electronic Signature(s) Signed: 03/11/2022 11:31:26 AM By: Gretta Cool BSN, RN, CWS, Kim RN, BSN Signed: 03/15/2022 9:50:02 AM By: [name] Entered By: [name] on 03/09/2022 09:39:14 -------------------------------------------------------------------------------- Multi Wound Chart Details Patient Name: Date of Service: [name], Kelly Mons. 03/09/2022 9:15 A [name]: [id number] Patient Account Number: [id number] Date of Birth/Sex: Treating RN: [birth year] (47 y.o. [name] Primary Care Rhian Asebedo: [name] Other Clinician: Massie [name] Referring Alvino Lechuga: Treating Nixon Sparr/Extender: [name] in Treatment: [place] Signs [name], [name] ([id number]) [id number] Page 4 of 8 Height(in): 67 Pulse(bpm): 92 Weight(lbs): 153 Blood Pressure(mmHg): 119/73 Body Mass Index(BMI): 24 Temperature(F): 98.5 Respiratory Rate(breaths/min): 16 [2:Photos:] [N/A:N/A] Plantar [name] N/A N/A Wound Location: Trauma N/A N/A Wounding Event: Abscess N/A N/A Primary Etiology: Anemia, Asthma, History of pressure N/A N/A Comorbid History: wounds, Osteomyelitis, Neuropathy 06/18/2021 N/A N/A Date Acquired: 16 N/A N/A Weeks of Treatment: Open N/A N/A Wound Status: No N/A N/A Wound Recurrence: 0.3x0.2x0.1 N/A N/A Measurements L x W x D (cm) 0.047 N/A N/A A (cm) : rea 0.005 N/A N/A Volume (cm) : 93.40% N/A N/A % Reduction in Area: 98.20% N/A N/A % Reduction in Volume: Full Thickness Without Exposed N/A N/A Classification: Support Structures Medium N/A N/A Exudate Amount: Serosanguineous N/A N/A Exudate Type: red, brown N/A N/A Exudate Color: Medium (34-66%) N/A N/A Granulation Amount: Pink N/A N/A Granulation Quality: Medium (34-66%) N/A  N/A Necrotic Amount: Fat Layer (Subcutaneous Tissue): Yes N/A N/A Exposed Structures: Fascia: No Tendon: No Muscle: No Joint: No Bone: No Medium (34-66%) N/A N/A Epithelialization: Treatment Notes Electronic Signature(s) Signed: 03/15/2022 9:50:02 AM By: [name] Entered By: [name] on 03/09/2022 09:39:18 -------------------------------------------------------------------------------- Multi-Disciplinary Care Plan Details Patient Name: Date of Service: [name], Kelly Mons. 03/09/2022 9:15 A [name]: [id number] Patient Account Number: [id number] Date of Birth/Sex: Treating RN: August 06, 1975 (47 y.o. [name] Primary Care Chelsae Zanella: [name] Other Clinician: Massie [name] Referring Bodin Gorka: Treating Havanah Nelms/Extender: [name] in Treatment: [address], [name] ([id number]) [id number] Page 5 of 8 Active  Inactive Wound/Skin Impairment Nursing Diagnoses: Knowledge deficit related to ulceration/compromised skin integrity Goals: Patient/caregiver will verbalize understanding of skin care regimen Date Initiated: 08/20/2021 Target Resolution Date: 02/20/2022 Goal Status: Active Ulcer/skin breakdown will have a volume reduction of 30% by week 4 Date Initiated: 08/20/2021 Date Inactivated: 01/26/2022 Target Resolution Date: 09/20/2021 Goal Status: Unmet Unmet Reason: COMORBIDITIES Ulcer/skin breakdown will have a volume reduction of 50% by week 8 Date Initiated: 08/20/2021 Date Inactivated: 01/26/2022 Target Resolution Date: 10/20/2021 Goal Status: Unmet Unmet Reason: COMORBIDITIES Ulcer/skin breakdown will have a volume reduction of 80% by week 12 Date Initiated: 08/20/2021 Date Inactivated: 01/26/2022 Target Resolution Date: 11/20/2021 Goal Status: Unmet Unmet Reason: COMORBIDITIES Ulcer/skin breakdown will heal within 14 weeks Date Initiated: 08/20/2021 Date Inactivated: 01/26/2022 Target Resolution  Date: 12/20/2021 Goal Status: Unmet Unmet Reason: COMORBIDITIES Interventions: Assess patient/caregiver ability to obtain necessary supplies Assess patient/caregiver ability to perform ulcer/skin care regimen upon admission and as needed Assess ulceration(s) every visit Notes: Electronic Signature(s) Signed: 03/11/2022 11:31:26 AM By: Gretta Cool, BSN, RN, CWS, Kim RN, BSN Signed: 03/15/2022 9:50:02 AM By: [name] Entered By: [name] on 03/09/2022 09:55:18 -------------------------------------------------------------------------------- Pain Assessment Details Patient Name: Date of Service: [name], Kelly Mons. 03/09/2022 9:15 A [name]: [id number] Patient Account Number: [id number] Date of Birth/Sex: Treating RN: [birth year] (47 y.o. [name] Primary Care Jerre Diguglielmo: [name] Other Clinician: Massie [name] Referring Edee Nifong: Treating Coralynn Gaona/Extender: [name] in Treatment: 28 Active Problems Location of Pain Severity and Description of Pain Patient Has Paino Yes Site Locations Pain Location: [name], [name] ([id number]) [id number] Page 6 of 8 Pain Location: Generalized Pain Duration of the Pain. Constant / Intermittento Constant Rate the pain. Current Pain Level: 9 Character of Pain Describe the Pain: Aching, Burning, Throbbing Pain Management and Medication Current Pain Management: Medication: Yes Cold Application: Yes Rest: Yes Massage: No Activity: No T.E.N.S.: No Heat Application: Yes Leg drop or elevation: No Is the Current Pain Management Adequate: Inadequate How does your wound impact your activities of daily livingo Sleep: No Bathing: No Appetite: No Relationship With Others: No Bladder Continence: No Emotions: No Bowel Continence: No Work: No Toileting: No Drive: No Dressing: No Hobbies: No Engineer, maintenance) Signed: 03/11/2022 11:31:26 AM By: Gretta Cool, BSN,  RN, CWS, Kim RN, BSN Signed: 03/15/2022 9:50:02 AM By: [name] Entered By: [name] on 03/09/2022 09:31:35 -------------------------------------------------------------------------------- Patient/Caregiver Education Details Patient Name: Date of Service: [name] 2/20/2024andnbsp9:15 A [name]: [id number] Patient Account Number: [id number] Date of Birth/Gender: Treating RN: 1975/11/09 (47 y.o. [name] Primary Care Physician: [name] Other Clinician: Massie [name] Referring Physician: Treating Physician/Extender: [name] in Treatment: 28 Education Assessment Education Provided To: Patient Education Topics Provided Wound/Skin Impairment: Handouts: Other: conitnue wound care as directed [name], [name] ([id number]) [id number] Page 7 of 8 Methods: Explain/Verbal Responses: State content correctly Electronic Signature(s) Signed: 03/15/2022 9:50:02 AM By: [name] Entered By: [name] on 03/09/2022 09:55:39 -------------------------------------------------------------------------------- Wound Assessment Details Patient Name: Date of Service: [name], Kelly Mons. 03/09/2022 9:15 A [name]: [id number] Patient Account Number: [id number] Date of Birth/Sex: Treating RN: [birth year] (47 y.o. [name] Primary Care Ilanna Deihl: [name] Other Clinician: Massie [name] Referring Palma Buster: Treating Lynna Zamorano/Extender: [name] in Treatment: 28 Wound Status Wound Number: 2 Primary Abscess Etiology: Wound Location: Plantar [name] Wound Status: Open Wounding Event: Trauma Comorbid Anemia, Asthma, History of pressure wounds, Osteomyelitis, Date Acquired:  06/18/2021 History: Neuropathy Weeks Of Treatment: 28 Clustered Wound: No Photos Wound Measurements Length: (cm) 0.1 Width: (cm) 0.1 Depth: (cm) 0.1 Area: (cm)  0.008 Volume: (cm) 0.001 % Reduction in Area: 98.9% % Reduction in Volume: 99.6% Epithelialization: Medium (34-66%) Wound Description Classification: Full Thickness Without Exposed Suppor Exudate Amount: Medium Exudate Type: Serosanguineous Exudate Color: red, brown t Structures Foul Odor After Cleansing: No Slough/Fibrino No Wound Bed Granulation Amount: Medium (34-66%) Exposed Structure Granulation Quality: Pink Fascia Exposed: No Necrotic Amount: Medium (34-66%) Fat Layer (Subcutaneous Tissue) Exposed: Yes Tendon Exposed: No Muscle Exposed: No Joint Exposed: No [name], [name] ([id number]) [id number] Page 8 of 8 Bone Exposed: No Treatment Notes Wound #2 ([name]) Wound Laterality: Plantar Cleanser Byram Ancillary Kit - 15 Day Supply Discharge Instruction: Use supplies as instructed; Kit contains: (15) Saline Bullets; (15) 3x3 Gauze; 15 pr Gloves Soap and Water Discharge Instruction: Gently cleanse wound with antibacterial soap, rinse and pat dry prior to dressing wounds Peri-Wound Care Topical Primary Dressing Hydrofera Blue Ready Transfer Foam, 2.5x2.5 (in/in) Discharge Instruction: Apply Hydrofera Blue Ready to wound bed as directed Secondary Dressing (BORDER) Zetuvit Plus SILICONE BORDER Dressing 4x4 (in/in) Discharge Instruction: Please do not put silicone bordered dressings under wraps. Use non-bordered dressing only. Secured With Compression Wrap Compression Stockings Environmental education officer) Signed: 03/11/2022 11:31:26 AM By: Gretta Cool, BSN, RN, CWS, Kim RN, BSN Signed: 03/15/2022 9:50:02 AM By: [name] Entered By: [name] on 03/09/2022 09:58:08 -------------------------------------------------------------------------------- Vitals Details Patient Name: Date of Service: [name], Kelly Mons. 03/09/2022 9:15 A [name]: [id number] Patient Account Number: [id number] Date of Birth/Sex: Treating  RN: [birth year] (47 y.o. [name], Kim Primary Care Brizeida Mcmurry: [name] Other Clinician: Massie [name] Referring Abenezer Odonell: Treating Walsie Smeltz/Extender: [name] in Treatment: 28 Vital Signs Time Taken: 09:29 Temperature (F): 98.5 Height (in): 67 Pulse (bpm): 92 Weight (lbs): 153 Respiratory Rate (breaths/min): 16 Body Mass Index (BMI): 24 Blood Pressure (mmHg): 119/73 Reference Range: 80 - 120 mg / dl Electronic Signature(s) Signed: 03/15/2022 9:50:02 AM By: [name] Entered By: [name] on 03/09/2022 09:31:29

## 2022-03-15 NOTE — TOC Initial Note (Signed)
Transition of Care [facility]) - Initial/Assessment Note    Patient Details  Name: [name] MRN: [id number] Date of Birth: 08-07-1975  Transition of Care [facility] CM/SW Contact:    Conception Oms, RN Phone Number: 03/15/2022, 10:36 AM  Clinical Narrative:                  Spoke with the patient, she will be going to her parents house to stay at [place] to deliver a rolling walker and 3 in 1 to the bedside prior to DC, her dad will provide transportation [place] will see the patient for [facility] services   Expected Discharge Plan: [place] Barriers to Discharge: No Barriers Identified   Patient Goals and CMS Choice Patient states their goals for this hospitalization and ongoing recovery are:: go home          Expected Discharge Plan and Services   Discharge Planning Services: CM Consult   Living arrangements for the past 2 months: Mobile Home                 DME Arranged: Walker rolling, 3-N-1 DME Agency: [employer] Date DME Agency Contacted: 03/15/22 Time DME Agency Contacted: 67 Representative spoke with at DME Agency: intake HH Arranged: PT, OT HH Agency: [place] Date [facility] Agency Contacted: 03/15/22 Time HH Agency Contacted: 24 Representative spoke with at [place]: [name]  Prior Living Arrangements/Services Living arrangements for the past 2 months: Mobile Home Lives with:: Parents Patient language and need for interpreter reviewed:: Yes        Need for Family Participation in Patient Care: Yes (Comment) Care giver support system in place?: Yes (comment)   Criminal Activity/Legal Involvement Pertinent to Current Situation/Hospitalization: No - Comment as needed  Activities of Daily Living Home Assistive Devices/Equipment: Brace (specify type) ADL Screening (condition at time of admission) Patient's cognitive ability adequate to safely complete daily activities?: Yes Is the  patient deaf or have difficulty hearing?: No Does the patient have difficulty seeing, even when wearing glasses/contacts?: No Does the patient have difficulty concentrating, remembering, or making decisions?: No Patient able to express need for assistance with ADLs?: Yes Does the patient have difficulty dressing or bathing?: No Independently performs ADLs?: Yes (appropriate for developmental age) Does the patient have difficulty walking or climbing stairs?: No Weakness of Legs: None Weakness of Arms/Hands: None  Permission Sought/Granted   Permission granted to share information with : Yes, Verbal Permission Granted              Emotional Assessment Appearance:: Appears stated age Attitude/Demeanor/Rapport: Engaged Affect (typically observed): Pleasant Orientation: : Oriented to Self, Oriented to Place, Oriented to  Time, Oriented to Situation Alcohol / Substance Use: Not Applicable Psych Involvement: No (comment)  Admission diagnosis:  Right knee pain [M25.561] Patient Active Problem List   Diagnosis Date Noted   Right knee pain 03/12/2022   Septic joint of right knee joint ([place]) 03/12/2022   Asthma 03/12/2022   Hypokalemia 03/12/2022   Tobacco abuse 03/12/2022   Cellulitis of right lower extremity 03/12/2022   Pain and swelling of right knee 03/04/2022   Constipation 12/18/2021   Cervical cancer screening 05/08/2019   Breast cancer screening by mammogram 05/08/2019   Tennis elbow 11/27/2018   Positive urine drug screen 07/15/2018   Closed left ankle fracture 06/14/2018   Attention deficit 05/08/2018   Acute cough 02/15/2018   Family history of colon cancer 02/15/2018  Vitamin D deficiency 09/17/2015   Muscular dystrophy ([place]) 03/26/2015   Neuropathic pain 03/26/2015   Irregular periods 02/26/2015   Allergic rhinitis 02/26/2015   Tooth pain 01/08/2015   Anxiety and depression 12/26/2014   Charcot-Marie-Tooth disease 12/26/2014   Stress incontinence 12/26/2014    Iron deficiency 12/26/2014   Current tobacco use 07/11/2013   Chronic pain 08/25/2012   Foot pain 08/25/2012   Other long term (current) drug therapy 09/30/2011   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: [phone]: [phone]    Social Determinants of Health (SDOH) Social History: SDOH Screenings   Food Insecurity: No Food Insecurity (03/12/2022)  Housing: Low Risk  (03/12/2022)  Transportation Needs: No Transportation Needs (03/12/2022)  Utilities: Not At Risk (03/12/2022)  Depression (PHQ2-9): Low Risk  (03/04/2022)  Financial Resource Strain: Low Risk  (02/15/2018)  Physical Activity: Unknown (02/15/2018)  Stress: Stress Concern Present (02/15/2018)  Tobacco Use: High Risk (03/14/2022)   SDOH Interventions:     Readmission Risk Interventions     No data to display

## 2022-03-15 NOTE — Progress Notes (Signed)
  Subjective:  Patient reports pain as well-controlled.  Patient did not get out of bed yesterday.  She is resting comfortably in bed.  Objective:   VITALS:   Vitals:   03/14/22 1619 03/14/22 1719 03/14/22 2235 03/15/22 0714  BP: (!) 80/47 (!) 99/59 109/67 (!) 98/52  Pulse: 83  78 80  Resp: 18   17  Temp: 99.1 F (37.3 C)   99.1 F (37.3 C)  TempSrc:    Oral  SpO2: 100% 100%  100%  Weight:      Height:        PHYSICAL EXAM:  General: Comfortable Right lower extremity: Dressing is clean and intact.  Dressing removed, drains removed, incisions are clean dry and intact, swelling and erythema are improved.  Grossly motor and sensory intact  LABS  Results for orders placed or performed during the hospital encounter of 03/12/22 (from the past 24 hour(s))  Glucose, capillary     Status: Abnormal   Collection Time: 03/15/22  7:45 AM  Result Value Ref Range   Glucose-Capillary 154 (H) 70 - 99 mg/dL    No results found.  Assessment/Plan: 2 Days Post-Op   Principal Problem:   Septic joint of right knee joint (HCC) Active Problems:   Anxiety and depression   Asthma   Hypokalemia   Tobacco abuse   Cellulitis of right lower extremity  Status post right knee arthroscopic I&D, open Baker's cyst decompression  -Appreciate hospitalist team management -Continue to narrow antibiotics based on culture data -Drains have been removed -PT/OT: Weight-bear as tolerated -DVT prophylaxis aspirin 325 mg daily -Okay for discharge pending PT and outpatient antibiotics   Renee Harder , MD 03/15/2022, 7:47 AM

## 2022-03-15 NOTE — Evaluation (Signed)
Physical Therapy Evaluation Patient Details Name: [name] MRN: [id number] DOB: Oct 03, 1975 Today's Date: 03/15/2022  History of Present Illness  Pt is a 47 y.o. female with medical history significant of asthma, depression with anxiety, anemia, stress incontinence, foot osteomyelitis, who presents with pain and swelling in right knee and right lower leg.  Pt diagnosed with right knee septic arthritis with posterior abscess versus Baker's cyst and is s/p right knee arthroscopic I&D and open Baker's cyst decompression.   Clinical Impression  Pt was pleasant and motivated to participate during the session and put forth good effort throughout. Pt required cues for sequencing with transfers and for step-to gait pattern with a RW but demonstrated good control and stability throughout the session as well as carryover of proper sequencing.  Pt was generally steady with standing activities with no overt LOB or R knee buckling and reported no adverse symptoms during the session including no R knee pain.  Pt will benefit from HHPT upon discharge to safely address deficits listed in patient problem list for decreased caregiver assistance and eventual return to PLOF.         Recommendations for follow up therapy are one component of a multi-disciplinary discharge planning process, led by the attending physician.  Recommendations may be updated based on patient status, additional functional criteria and insurance authorization.  Follow Up Recommendations Home health PT      Assistance Recommended at Discharge Intermittent Supervision/Assistance  Patient can return home with the following  A little help with walking and/or transfers;A little help with bathing/dressing/bathroom;Assistance with cooking/housework;Help with stairs or ramp for entrance;Assist for transportation    Equipment Recommendations Other (comment) (Pt will benefit from a RW and a BSC but may have access to both at discharge,  needs to confirm with parents)  Recommendations for Other Services       Functional Status Assessment Patient has had a recent decline in their functional status and demonstrates the ability to make significant improvements in function in a reasonable and predictable amount of time.     Precautions / Restrictions Precautions Precautions: Fall Restrictions Weight Bearing Restrictions: Yes RLE Weight Bearing: Weight bearing as tolerated      Mobility  Bed Mobility Overal bed mobility: Modified Independent             General bed mobility comments: Extra time and effort only    Transfers Overall transfer level: Needs assistance Equipment used: Rolling walker (2 wheels) Transfers: Sit to/from Stand Sit to Stand: Min guard           General transfer comment: Min verbal and visual cues for sequencing    Ambulation/Gait Ambulation/Gait assistance: Min guard Gait Distance (Feet): 30 Feet x 1, 10 Feet x 1 Assistive device: Rolling walker (2 wheels) Gait Pattern/deviations: Step-to pattern, Decreased stance time - right, Decreased step length - left Gait velocity: decreased     General Gait Details: Slow cadence with verbal and visual cues for step-to sequencing but no overt LOB or R knee buckling  Stairs            Wheelchair Mobility    Modified Rankin (Stroke Patients Only)       Balance Overall balance assessment: No apparent balance deficits (not formally assessed)  Pertinent Vitals/Pain Pain Assessment Pain Assessment: No/denies pain    Home Living Family/patient expects to be discharged to:: Private residence Living Arrangements: Parent Available Help at Discharge: Family;Available 24 hours/day Type of Home: House Home Access: Ramped entrance       Home Layout: One level Home Equipment: Rollator (4 wheels);Wheelchair - manual;Shower seat Additional Comments: Above describes  parent's home where she will be staying as long as needed upon discharge    Prior Function Prior Level of Function : Independent/Modified Independent             Mobility Comments: Ind amb community distances without an AD, no fall history ADLs Comments: Ind with ADLs     Hand Dominance        Extremity/Trunk Assessment   Upper Extremity Assessment Upper Extremity Assessment: Overall WFL for tasks assessed    Lower Extremity Assessment Lower Extremity Assessment: Generalized weakness;RLE deficits/detail RLE: Unable to fully assess due to pain       Communication   Communication: No difficulties  Cognition Arousal/Alertness: Awake/alert Behavior During Therapy: WFL for tasks assessed/performed Overall Cognitive Status: Within Functional Limits for tasks assessed                                          General Comments      Exercises Total Joint Exercises Ankle Circles/Pumps: AROM, Strengthening, Both, 5 reps, 10 reps Quad Sets: AROM, Strengthening, Both, 10 reps, 5 reps Gluteal Sets: Strengthening, Both, 5 reps, 10 reps Hip ABduction/ADduction: AAROM, Strengthening, Right, 10 reps Straight Leg Raises: AAROM, Strengthening, Right, 10 reps Long Arc Quad: AROM, Strengthening, Right, 10 reps, 15 reps Knee Flexion: AROM, Strengthening, Right, 10 reps, 15 reps Other Exercises Other Exercises: HEP education for BLE APs, QS, GS, and seated knee flex Other Exercises: RLE positioning education to promote knee ext PROM and prevent heel pressure Standing weight shifting left/right x 10   Assessment/Plan    PT Assessment Patient needs continued PT services  PT Problem List Decreased strength;Decreased range of motion;Decreased activity tolerance;Decreased balance;Decreased mobility;Decreased knowledge of use of DME;Pain       PT Treatment Interventions DME instruction;Gait training;Functional mobility training;Therapeutic activities;Therapeutic  exercise;Balance training;Patient/family education    PT Goals (Current goals can be found in the Care Plan section)  Acute Rehab PT Goals Patient Stated Goal: To be able to walk my dog PT Goal Formulation: With patient Time For Goal Achievement: 03/28/22 Potential to Achieve Goals: Good    Frequency Min 2X/week     Co-evaluation               AM-PAC PT "6 Clicks" Mobility  Outcome Measure Help needed turning from your back to your side while in a flat bed without using bedrails?: A Little Help needed moving from lying on your back to sitting on the side of a flat bed without using bedrails?: A Little Help needed moving to and from a bed to a chair (including a wheelchair)?: A Little Help needed standing up from a chair using your arms (e.g., wheelchair or bedside chair)?: A Little Help needed to walk in hospital room?: A Little Help needed climbing 3-5 steps with a railing? : A Lot 6 Click Score: 17    End of Session Equipment Utilized During Treatment: Gait belt Activity Tolerance: Patient tolerated treatment well Patient left: in bed;with call bell/phone within reach;with bed alarm set Nurse Communication:  Mobility status;Weight bearing status PT Visit Diagnosis: Other abnormalities of gait and mobility (R26.89);Muscle weakness (generalized) (M62.81);Difficulty in walking, not elsewhere classified (R26.2)    Time: [id number] PT Time Calculation (min) (ACUTE ONLY): 35 min   Charges:   PT Evaluation $PT Eval Moderate Complexity: 1 Mod PT Treatments $Gait Training: 8-22 mins $Therapeutic Exercise: 8-22 mins      D. Scott Dimas Scheck PT, DPT 03/15/22, 11:39 AM

## 2022-03-15 NOTE — Care Management Important Message (Signed)
Important Message  Patient Details  Name: [name] MRN: [id number] Date of Birth: Apr 13, 1975   Medicare Important Message Given:  N/A - LOS <3 / Initial given by admissions     Juliann Pulse A Meika Earll 03/15/2022, 11:14 AM

## 2022-03-15 NOTE — Discharge Summary (Signed)
Physician Discharge Summary   Patient: [name] MRN: [id number] DOB: 11/30/1975  Admit date:     03/12/2022  Discharge date: 03/15/22  Discharge Physician: Lorella Nimrod   PCP: Leone Haven, MD   Recommendations at discharge:  Please obtain CBC and BMP in 1 week Follow-up with orthopedic surgery Follow-up with primary care provider  Discharge Diagnoses: Principal Problem:   Septic joint of right knee joint [facility]) Active Problems:   Asthma   Anxiety and depression   Hypokalemia   Tobacco abuse   Cellulitis of right lower extremity   Hospital Course: Taken from H&P.   [name] is a 47 y.o. female with medical history significant of asthma, depression with anxiety, anemia, stress incontinence, foot osteomyelitis, who presents with pain and swelling in right knee and right lower leg.   Patient states that she has pain in the right knee and the right lower leg for more than a week.    She was seen at [facility] outpatient clinic underwent knee aspiration of thick purulent fluid and referred to the ER.  Repeat aspiration was obtained in the ER with 170+K cells, no crystals seen.  Data reviewed independently and ED Course: pt was found to have WBC 13.7, potassium 3.4, renal function okay, temperature 99.2, blood pressure 101/68, heart rate 85, RR 20, oxygen saturation 100% on room air.  She was started on ceftriaxone and Zyvox.  Patient has a recent right lower extremity venous Doppler which was negative for DVT but did show a fluid collection along with prominent right groin lymph node.  MRI of right lower extremity with extensive subcutaneous edema, most compatible with cellulitis.  Negative for osteomyelitis.  Fluid collection appears to be a large dissecting Baker's cyst.  Orthopedic was consulted and patient will be going for joint washout in the morning.  2/24: Vitals with softer blood pressure at 83/60.  Recent wound culture from right foot with  group G Streptococcus, knee aspirate cultures with no organisms so far.  Underwent joint arthroscopic I&D and open Baker's cyst decompression.  Preliminary blood cultures negative.  CRP elevated at 7.   2/25: Hemodynamically stable with borderline blood pressure at 93/56.  Synovial fluid from outpatient joint aspiration with rare Streptococcus group G, repeat cultures from yesterday's procedure still pending.  Blood cultures remain negative.  CBC with resolution of leukocytosis, slight decrease in hemoglobin to 10.6 after the surgery, all cell lines decreased so some dilutional effect.  BMP stable.  Discontinuing linezolid and we will continue ceftriaxone for now.  She can be discharged on cefpodoxime 400 mg twice daily, based on her allergies and her generation of cephalosporin being very effective for group G strep, discussed with pharmacy.  2/26: Patient remained stable with improving pain.  Blood pressure remained on softer side but per patient this is her baseline and she remains asymptomatic.  Drain was removed and orthopedic cleared her for discharge.  She is being given 1 week of cefpodoxime per pharmacy recommendation based on her culture at orthopedic surgery office.  Repeat blood cultures during incision and drainage remain negative but patient was already on broad-spectrum antibiotics.  Discussed with patient that she will try naproxen and Tylenol first before taking Norco to help with her blood pressure and keeping the pain under control.  She seems understanding.  Patient is being discharged to her parents house for further care until she can go back to her own place.  She will continue with rest of her home medications  and need to have a close follow-up with her providers including orthopedic surgery for further recommendations.         Assessment and Plan: * Septic joint of right knee joint ([place]) S/p joint irrigation by orthopedic surgery.  Preliminary blood cultures  negative MRI is negative for osteomyelitis and lower extremity.  Recent blood cultures during I&D yesterday are negative so far, cultures from orthopedic surgery office with group G Streptococcus.  Linezolid was discontinued. -Continue with Rocephin while in the hospital -Follow-up cultures -Continue with pain management  Asthma  Stable -Bronchodilators  Anxiety and depression -Continue home medications  Hypokalemia Resolved.   Tobacco abuse -Nicotine patch  Cellulitis of right lower extremity MRI negative for any osteomyelitis.  Fluid collection consistent with dissecting Baker's cyst which has been surgically drained during the procedure yesterday. -On broad-spectrum antibiotics   Pain control - [place] Controlled Substance Reporting System database was reviewed. and patient was instructed, not to drive, operate heavy machinery, perform activities at heights, swimming or participation in water activities or provide baby-sitting services while on Pain, Sleep and Anxiety Medications; until their outpatient Physician has advised to do so again. Also recommended to not to take more than prescribed Pain, Sleep and Anxiety Medications.  Consultants: Orthopedic surgery Procedures performed: Incision and drainage of right knee joint and aspiration of Baker's cyst Disposition: Relative's home Diet recommendation:  Discharge Diet Orders (From admission, onward)     Start     Ordered   03/15/22 0000  Diet - low sodium heart healthy        03/15/22 1105           Regular diet DISCHARGE MEDICATION: Allergies as of 03/15/2022       Reactions   Bee Venom Swelling   Vancomycin Shortness Of Breath   Penicillins    Other reaction(s): RASH   Sulfamethoxazole-trimethoprim Itching   Tramadol Nausea Only   Other reaction(s): NAUSEA   Clindamycin Nausea Only   And heartburn        Medication List     STOP taking these medications    benzonatate 100 MG  capsule Commonly known as: TESSALON   brompheniramine-pseudoephedrine-DM 30-2-10 MG/5ML syrup   doxycycline 100 MG capsule Commonly known as: VIBRAMYCIN   guaiFENesin 100 MG/5ML liquid Commonly known as: ROBITUSSIN   lidocaine-prilocaine cream Commonly known as: EMLA   meloxicam 7.5 MG tablet Commonly known as: MOBIC   oxybutynin 10 MG 24 hr tablet Commonly known as: DITROPAN-XL       TAKE these medications    albuterol 108 (90 Base) MCG/ACT inhaler Commonly known as: VENTOLIN HFA INHALE 2 PUFFS EVERY 6 HOURS AS NEEDED FOR WHEEZE OR SHORTNESS OF BREATH   aspirin 325 MG tablet Take 1 tablet (325 mg total) by mouth daily. Start taking on: March 16, 2022   busPIRone 7.5 MG tablet Commonly known as: BUSPAR Take 1 tablet (7.5 mg total) by mouth 3 (three) times daily.   cefpodoxime 200 MG tablet Commonly known as: VANTIN Take 2 tablets (400 mg total) by mouth 2 (two) times daily for 7 days.   diclofenac Sodium 1 % Gel Commonly known as: Voltaren Apply 2 g topically 4 (four) times daily. Prn   escitalopram 20 MG tablet Commonly known as: LEXAPRO Take 1 tablet (20 mg total) by mouth daily.   fluticasone 50 MCG/ACT nasal spray Commonly known as: FLONASE Place 2 sprays into both nostrils daily.   gentamicin cream 0.1 % Commonly known as: GARAMYCIN Apply 1  Application topically 2 (two) times daily.   naproxen 500 MG tablet Commonly known as: Naprosyn Take 1 tablet (500 mg total) by mouth 2 (two) times daily with a meal.   oxyCODONE-acetaminophen 5-325 MG tablet Commonly known as: PERCOCET/ROXICET Take 1 tablet by mouth every 4 (four) hours as needed for moderate pain.   polyethylene glycol powder 17 GM/SCOOP powder Commonly known as: GLYCOLAX/MIRALAX Take 17 g by mouth daily as needed for mild constipation.               Durable Medical Equipment  (From admission, onward)           Start     Ordered   03/15/22 1033  For home use only DME  Walker rolling  Once       Question Answer Comment  Walker: With [place] Wheels   Patient needs a walker to treat with the following condition Impaired mobility      03/15/22 1033   03/15/22 1033  For home use only DME Bedside commode  Once       Question:  Patient needs a bedside commode to treat with the following condition  Answer:  Impaired mobility   03/15/22 1033            Follow-up Information     Leone Haven, MD. Schedule an appointment as soon as possible for a visit in 1 week(s).   Specialty: Family Medicine Contact information: [address] [place]  [address] [phone]         Renee Harder, MD. Schedule an appointment as soon as possible for a visit in 1 week(s).   Specialty: Orthopedic Surgery Contact information: [place] [address] [phone]                Discharge Exam: Danley Danker Weights   03/12/22 0857  Weight: 69.9 kg   General.  Well-developed lady, in no acute distress. Pulmonary.  Lungs clear bilaterally, normal respiratory effort. CV.  Regular rate and rhythm, no JVD, rub or murmur. Abdomen.  Soft, nontender, nondistended, BS positive. CNS.  Alert and oriented .  No focal neurologic deficit. Extremities.  No edema, no cyanosis, pulses intact and symmetrical.  Right knee with Ace wrap Psychiatry.  Judgment and insight appears normal.   Condition at discharge: stable  The results of significant diagnostics from this hospitalization (including imaging, microbiology, ancillary and laboratory) are listed below for reference.   Imaging Studies: MR TIBIA FIBULA RIGHT WO CONTRAST  Result Date: 03/13/2022 CLINICAL DATA:  Right lower leg pain and swelling. Possible infection. EXAM: MRI OF LOWER RIGHT EXTREMITY WITHOUT CONTRAST TECHNIQUE: Multiplanar, multisequence MR imaging of the right lower leg was performed. No intravenous contrast was administered. COMPARISON:  None Available.  FINDINGS: Bones/Joint/Cartilage The coronal images incidentally include the left lower leg. No marrow signal abnormality to suggest osteomyelitis, fracture or stress change is identified. No focal marrow lesion. Ligaments Intact. Muscles and Tendons There is severe fatty atrophy of lower leg musculature bilaterally. No tear or strain is identified. Fluid collection interposed between the medial gastrocnemius and soleus on the right measures approximately 15 cm craniocaudal by 6.5 cm transverse by 1.9 cm AP. The collection extends above the superior margin of the scan and appears to be a dissecting Baker's cyst. Soft tissues Extensive subcutaneous edema is present about the lower leg on right. IMPRESSION: Extensive subcutaneous edema about the right lower leg is most compatible with cellulitis. Negative for osteomyelitis. Fluid collection extending  above the superior margin of the scan appears to be a large dissecting Baker's cyst. Severe fatty atrophy of lower leg musculature bilaterally. Electronically Signed   By: Inge Rise M.D.   On: 03/13/2022 09:15   DG Knee Complete 4 Views Right  Result Date: 03/12/2022 CLINICAL DATA:  Pain and swelling EXAM: RIGHT KNEE - COMPLETE 4+ VIEW COMPARISON:  02/12/2020 FINDINGS: No fracture or dislocation. Moderate knee joint effusion. Joint spaces are maintained. IMPRESSION: No fracture or dislocation. Moderate knee joint effusion. Electronically Signed   By: Miachel Roux M.D.   On: 03/12/2022 11:23   MR FOOT RIGHT WO CONTRAST  Result Date: 03/11/2022 CLINICAL DATA:  History of right great toe amputation. Soft tissue wound along the plantar aspect of the foot. EXAM: MRI OF THE RIGHT FOREFOOT WITHOUT CONTRAST TECHNIQUE: Multiplanar, multisequence MR imaging of the right foot was performed. No intravenous contrast was administered. COMPARISON:  Prior MRI from 07/03/2021. FINDINGS: Examination is limited as the patient could not complete the examination due to pain.  Surgical changes related to prior partial first ray amputation. No findings suspicious for osteomyelitis involving the first metatarsal. The other bony structures are intact. No findings for septic arthritis or osteomyelitis. Advanced midfoot degenerative changes may be neuropathic disease. There is an old healed second metatarsal fracture noted. No discrete subcutaneous fluid collection to suggest a drainable abscess. There is diffuse mild cellulitis. IMPRESSION: 1. Limited examination as the patient could not complete the examination due to pain. 2. Surgical changes related to prior partial first ray amputation. No findings suspicious for osteomyelitis involving the residual first metatarsal. 3. No findings for septic arthritis or osteomyelitis. 4. Advanced midfoot degenerative changes may be neuropathic disease. 5. Diffuse mild cellulitis. No discrete subcutaneous fluid collection to suggest a drainable abscess. Electronically Signed   By: Marijo Sanes M.D.   On: 03/11/2022 08:42   US Venous Img Lower Unilateral Right (DVT)  Result Date: 03/09/2022 CLINICAL DATA:  Swelling EXAM: Right LOWER EXTREMITY VENOUS DOPPLER ULTRASOUND TECHNIQUE: Gray-scale sonography with compression, as well as color and duplex ultrasound, were performed to evaluate the deep venous system(s) from the level of the common femoral vein through the popliteal and proximal calf veins. COMPARISON:  None Available. FINDINGS: VENOUS Normal compressibility of the common femoral, superficial femoral, and popliteal veins, as well as the visualized calf veins. Visualized portions of profunda femoral vein and great saphenous vein unremarkable. No filling defects to suggest DVT on grayscale or color Doppler imaging. Doppler waveforms show normal direction of venous flow, normal respiratory plasticity and response to augmentation. Limited views of the contralateral common femoral vein are unremarkable. OTHER Popliteal fossa fluid collection  measuring 35 x 13 x 24 mm. Apparent intramuscular slightly complex fluid collection at the right proximal mid medial calf measuring 10 x 19 mm. Prominent right groin lymph node with slightly thickened cortex up to 6 mm. Lymph node measures approximately 2.4 by 1.1 cm. Limitations: none IMPRESSION: 1. Negative for acute right lower extremity DVT. 2. Popliteal fossa fluid collection/cyst measuring up to 35 mm. 3. Apparent intramuscular slightly complex fluid collection at the right proximal mid medial calf measuring 10 x 19 mm. Fluid collection could be inflammatory, infectious, or posttraumatic in etiology. Correlation with MRI as clinically indicated. 4. Prominent right groin lymph node with slightly thickened cortex up to 6 mm. Lymph node measures approximately 2.4 x 1.1 cm. Negative. Electronically Signed   By: Donavan Foil M.D.   On: 03/09/2022 17:10    Microbiology:  Results for orders placed or performed during the hospital encounter of 03/12/22  Body fluid culture w Gram Stain     Status: None   Collection Time: 03/12/22 12:29 PM   Specimen: Synovium; Body Fluid  Result Value Ref Range Status   Specimen Description   Final    SYNOVIAL RIGHT KNEE Performed at [facility], [address], [place], [place] [address]    Special Requests   Final    NONE Performed at [facility], [address] [place], [place] [address]    Gram Stain   Final    FEW WBC PRESENT, PREDOMINANTLY PMN NO ORGANISMS SEEN    Culture   Final    RARE STREPTOCOCCUS GROUP G Beta hemolytic streptococci are predictably susceptible to penicillin and other beta lactams. Susceptibility testing not routinely performed. CRITICAL RESULT CALLED TO, READ BACK BY AND VERIFIED WITH: PHARMD C COULTER [place]:[id number] AT 823 AM BY CM Performed at [facility], [place] [address], [place], [place] [address]    Report Status 03/14/2022 FINAL  Final  Culture, blood (routine x 2)     Status: None (Preliminary result)    Collection Time: 03/12/22  1:10 PM   Specimen: BLOOD  Result Value Ref Range Status   Specimen Description BLOOD BLOOD RIGHT ARM  Final   Special Requests   Final    BOTTLES DRAWN AEROBIC AND ANAEROBIC Blood Culture results may not be optimal due to an excessive volume of blood received in culture bottles   Culture   Final    NO GROWTH 3 DAYS Performed at [facility], [address] [place], [place] [address]    Report Status PENDING  Incomplete  Culture, blood (routine x 2)     Status: None (Preliminary result)   Collection Time: 03/12/22  1:10 PM   Specimen: BLOOD  Result Value Ref Range Status   Specimen Description BLOOD BLOOD RIGHT ARM  Final   Special Requests   Final    BOTTLES DRAWN AEROBIC AND ANAEROBIC Blood Culture results may not be optimal due to an excessive volume of blood received in culture bottles   Culture   Final    NO GROWTH 3 DAYS Performed at [facility], [address], [place], [place] [address]    Report Status PENDING  Incomplete  MRSA Next Gen by PCR, Nasal     Status: None   Collection Time: 03/12/22  4:17 PM   Specimen: Nasal Mucosa; Nasal Swab  Result Value Ref Range Status   MRSA by PCR Next Gen NOT DETECTED NOT DETECTED Final    Comment: (NOTE) The GeneXpert MRSA Assay (FDA approved for NASAL specimens only), is one component of a comprehensive MRSA colonization surveillance program. It is not intended to diagnose MRSA infection nor to guide or monitor treatment for MRSA infections. Test performance is not FDA approved in patients less than 13 years old. Performed at [facility], [address] [place], [place] [address]   Aerobic/Anaerobic Culture w Gram Stain (surgical/deep wound)     Status: None (Preliminary result)   Collection Time: 03/13/22  8:22 AM   Specimen: Synovial, Right Knee; Body Fluid  Result Value Ref Range Status   Specimen Description   Final    FLUID Performed at [facility],  [place] [address], [place], [place] [address]    Special Requests   Final    RIGHT KNEE SYNOVIAL FLUID Performed at [name], [place]  [address] [place], [place] [address]    Gram Stain   Final    ABUNDANT WBC PRESENT, PREDOMINANTLY PMN NO ORGANISMS SEEN    Culture   Final    NO GROWTH 2 DAYS Performed at [facility] [address] [place], [place] [address]    Report Status PENDING  Incomplete    Labs: CBC: Recent Labs  Lab 03/12/22 1116 03/14/22 0615  WBC 13.7* 9.7  NEUTROABS 8.3*  --   HGB 11.9* 10.6*  HCT 37.7 33.7*  MCV 93.5 92.3  PLT 499* [id number]*   Basic Metabolic Panel: Recent Labs  Lab 03/12/22 1100 03/12/22 1116 03/14/22 0615  NA  --  136 135  K  --  3.4* 3.7  CL  --  96* 104  CO2  --  29 25  GLUCOSE  --  97 102*  BUN  --  9 6  CREATININE  --  0.51 0.42*  CALCIUM  --  8.3* 7.8*  MG 2.2  --   --    Liver Function Tests: Recent Labs  Lab 03/12/22 1116  AST 36  ALT 46*  ALKPHOS 63  BILITOT 0.5  PROT 6.4*  ALBUMIN 2.5*   CBG: Recent Labs  Lab 03/15/22 0745  GLUCAP 154*    Discharge time spent: greater than 30 minutes.  This record has been created using Systems analyst. Errors have been sought and corrected,but may not always be located. Such creation errors do not reflect on the standard of care.   Signed: Lorella Nimrod, MD [facility] 03/15/2022

## 2022-03-15 NOTE — Plan of Care (Signed)
  Problem: Education: Goal: Knowledge of General Education information will improve Description: Including pain rating scale, medication(s)/side effects and non-pharmacologic comfort measures Outcome: Progressing   Problem: Elimination: Goal: Will not experience complications related to bowel motility Outcome: Progressing   Problem: Nutrition: Goal: Adequate nutrition will be maintained Outcome: Progressing   Problem: Activity: Goal: Risk for activity intolerance will decrease Outcome: Progressing   Problem: Pain Managment: Goal: General experience of comfort will improve Outcome: Progressing   Problem: Safety: Goal: Ability to remain free from injury will improve Outcome: Progressing   Problem: Skin Integrity: Goal: Risk for impaired skin integrity will decrease Outcome: Progressing

## 2022-03-16 ENCOUNTER — Ambulatory Visit: Payer: Medicare HMO | Admitting: Internal Medicine

## 2022-03-16 ENCOUNTER — Telehealth: Payer: Self-pay | Admitting: *Deleted

## 2022-03-16 ENCOUNTER — Ambulatory Visit: Payer: Medicare HMO | Admitting: Physician Assistant

## 2022-03-16 NOTE — Transitions of Care (Post Inpatient/ED Visit) (Signed)
   03/16/2022  Name: [name] MRN: [id number] DOB: Jan 06, 1976  Today's TOC FU Call Status: Today's TOC FU Call Status:: Unsuccessul Call (1st Attempt) Unsuccessful Call (1st Attempt) Date: 03/16/22  Attempted to reach the patient regarding the most recent Inpatient/ED visit.  Follow Up Plan: Additional outreach attempts will be made to reach the patient to complete the Transitions of Care (Post Inpatient/ED visit) call.   [place] [facility] Management [phone]

## 2022-03-17 ENCOUNTER — Telehealth: Payer: Self-pay | Admitting: *Deleted

## 2022-03-17 LAB — CULTURE, BLOOD (ROUTINE X 2)
Culture: NO GROWTH
Culture: NO GROWTH

## 2022-03-17 NOTE — Transitions of Care (Post Inpatient/ED Visit) (Signed)
   03/17/2022  Name: [name] MRN: [id number] DOB: October 24, 1975  Today's TOC FU Call Status: Today's TOC FU Call Status:: Successful TOC FU Call Competed TOC FU Call Complete Date: 03/17/22  Transition Care Management Follow-up Telephone Call Date of Discharge: 03/15/22 Discharge Facility: [facility]) Type of Discharge: Inpatient Admission Primary Inpatient Discharge Diagnosis:: Septic joint of the knee How have you been since you were released from the hospital?: Better Any questions or concerns?: No  Items Reviewed: Did you receive and understand the discharge instructions provided?: Yes Medications obtained and verified?: Yes (Medications Reviewed) Any new allergies since your discharge?: No Dietary orders reviewed?: No Do you have support at home?: Yes People in Home: parent(s) Name of Support/Comfort Primary Source: [facility] and Equipment/Supplies: [place] Ordered?: Yes Name of [place]:: [name] Has Agency set up a time to come to your home?: Yes [name] Visit Date: 03/17/22 Any new equipment or medical supplies ordered?: Yes (walker and BSC) Name of Medical supply agency?: [employer] Were you able to get the equipment/medical supplies?: Yes Do you have any questions related to the use of the equipment/supplies?: No  Functional Questionnaire: Do you need assistance with bathing/showering or dressing?: Yes Do you need assistance with meal preparation?: Yes Do you need assistance with eating?: No Do you have difficulty maintaining continence: No Do you have difficulty managing or taking your medications?: No  Folllow up appointments reviewed: PCP Follow-up appointment confirmed?: Yes Date of PCP follow-up appointment?: 03/30/22 Follow-up Provider: Dr [name] 11:00 [facility] Follow-up appointment confirmed?: Yes Date of Specialist follow-up appointment?: 03/23/22 Follow-Up  Specialty Provider:: Dr [name] [id number] 9:15 Do you need transportation to your follow-up appointment?: No Do you understand care options if your condition(s) worsen?: Yes-patient verbalized understanding  SDOH Interventions Today    Flowsheet Row Most Recent Value  SDOH Interventions   Food Insecurity Interventions Intervention Not Indicated  Housing Interventions Intervention Not Indicated  Transportation Interventions Intervention Not Indicated       [place] Management [phone]

## 2022-03-18 ENCOUNTER — Other Ambulatory Visit: Payer: Self-pay

## 2022-03-18 DIAGNOSIS — F419 Anxiety disorder, unspecified: Secondary | ICD-10-CM

## 2022-03-18 DIAGNOSIS — R051 Acute cough: Secondary | ICD-10-CM

## 2022-03-18 DIAGNOSIS — J069 Acute upper respiratory infection, unspecified: Secondary | ICD-10-CM

## 2022-03-18 DIAGNOSIS — F32A Depression, unspecified: Secondary | ICD-10-CM

## 2022-03-18 DIAGNOSIS — J208 Acute bronchitis due to other specified organisms: Secondary | ICD-10-CM

## 2022-03-18 LAB — AEROBIC/ANAEROBIC CULTURE W GRAM STAIN (SURGICAL/DEEP WOUND): Culture: NO GROWTH

## 2022-03-18 MED ORDER — FLUTICASONE PROPIONATE 50 MCG/ACT NA SUSP: 2.0000 | Freq: Every day | NASAL | 0 refills | Status: DC

## 2022-03-18 MED ORDER — ESCITALOPRAM OXALATE 20 MG PO TABS: 20.0000 mg | ORAL_TABLET | Freq: Every day | ORAL | 0 refills | Status: DC

## 2022-03-18 MED ORDER — BUSPIRONE HCL 7.5 MG PO TABS: 7.5000 mg | ORAL_TABLET | Freq: Three times a day (TID) | ORAL | 0 refills | Status: DC

## 2022-03-18 MED ORDER — ALBUTEROL SULFATE HFA 108 (90 BASE) MCG/ACT IN AERS: INHALATION_SPRAY | RESPIRATORY_TRACT | 1 refills | Status: AC

## 2022-03-23 ENCOUNTER — Encounter: Payer: Medicare HMO | Attending: Physician Assistant | Admitting: Internal Medicine

## 2022-03-23 DIAGNOSIS — L97512 Non-pressure chronic ulcer of other part of right foot with fat layer exposed: Secondary | ICD-10-CM | POA: Diagnosis not present

## 2022-03-23 DIAGNOSIS — G600 Hereditary motor and sensory neuropathy: Secondary | ICD-10-CM | POA: Insufficient documentation

## 2022-03-23 DIAGNOSIS — L84 Corns and callosities: Secondary | ICD-10-CM | POA: Insufficient documentation

## 2022-03-23 DIAGNOSIS — M86671 Other chronic osteomyelitis, right ankle and foot: Secondary | ICD-10-CM | POA: Diagnosis not present

## 2022-03-23 DIAGNOSIS — G7100 Muscular dystrophy, unspecified: Secondary | ICD-10-CM | POA: Diagnosis not present

## 2022-03-23 DIAGNOSIS — G8929 Other chronic pain: Secondary | ICD-10-CM | POA: Diagnosis not present

## 2022-03-26 NOTE — Progress Notes (Signed)
NOLENE, STAHL ([id number]) [id number] Page 1 of 7 Visit Report for 03/23/2022 HPI Details Patient Name: Date of Service: [name]. 03/23/2022 9:15 A [name]: [id number] Patient Account Number: [id number] Date of Birth/Sex: Treating RN: [name] (47 y.o. [name] Primary Care Provider: [name] Other Clinician: [name] Referring Provider: Treating Provider/Extender: RO BSO Delane Ginger, MICHA EL Carolan Shiver, Randel Pigg in Treatment: 30 History of Present Illness HPI Description: 47 year old patient sent to [name] by her PCP 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 Her notes were reviewed and there was extensive history of opioid treatment and management by the [facility] pain clinic from where she has been terminated. X-ray of the right foot foot was done on 03/13/2015 and was suggestive of active infection and possible osteomyelitis. her past surgical history includes amputation of the left big toe in 2011 and amputation of the right big toe in 2015. She was sitting the wound clinic at [facility] and we will try and obtain these notes. She is a smoker and smokes about 15 cigarettes a day. 06/12/2015 - x-ray of the right foot -- IMPRESSION:1. Soft tissue swelling of the plantar surface of the foot. No underlying acute abnormality. 2. Amputation deformity again of the right great toe. Old healed bony deformity noted the right second metatarsal. No acute bony abnormality identified. We have not received any reports from the [facility] hospital yet. She continues to smoke at least 10 cigarettes a day 06/19/2015 -- she has increasing pain in the right foot and also has had some low-grade fever. Her vascular test is scheduled for tomorrow and her MRIs not till next  week. Readmission: 08-20-2021 upon evaluation today patient presents for initial inspection here in our clinic although she has been seen that was back in 2017. At that time she was having a issue with her right foot. With that being said upon inspection today she is actually having issues with the right foot as well on the medial aspect where she has some breakdown due to what I am assuming is pressure to the foot location. 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 I did review her culture report which showed evidence of bacteria including hemolytic Streptococcus group A as well as Staphylococcus aureus. The patient is currently on doxycycline and tells me that this is getting much better. T opical gentamicin is also being utilized. Also did review her arterial studies which show that she has on the right and ABI of 1.15 which is triphasic and appears to be excellent there is no significant peripheral artery disease noted the left is also. With ABI of 1.11 and a good report in that regard as well. Lastly I did also review her MRI of the right forefoot which was performed on 07-06-2021. This shows that she does have mild subcortical marrow edema in the stump of the first metatarsal concerning for osteomyelitis. There is also a malunited fracture of the second metatarsal. The patient has bone marrow edema in the base of the second third fourth and fifth metatarsals concerning for stress reaction obviously this infection is going to be an issue for [name] here as well  she needs to be treated appropriately and I do believe that the doxycycline is a good option right now although I may need to #1 extend this and #2 plan for even altering or adding to the treatment regimen depending on how things progress. Patient  voiced understanding.Marland Kitchen 09-01-2021 upon evaluation today patient appears to be doing excellent in regard to her wound. She is actually showing signs of excellent improvement which is great news. Fortunately I do not see any signs of active infection locally or systemically at this point which is great news and overall I am extremely pleased with where things stand today. I do not see any evidence of active infection locally or systemically. 09-07-2021 upon evaluation today patient appears to be doing well with regard to her foot ulcer. This is actually showing signs of improvement which is great news. Fortunately there does not appear to be any signs of active infection locally or systemically at this time which is as well. 09-22-2021 upon evaluation today patient appears to be doing well currently in regard to her wound. This is measuring smaller and looking better were slowly making progress here. Fortunately there does not appear to be any evidence of active infection locally or systemically at this time. Upon inspection patient's wound bed showed evidence of good granulation and epithelization at this point. 10-05-2021 upon evaluation today patient's wound shows a lot of callus buildup but the surface of the wound is actually doing quite well. She is going require some sharp debridement to clearway some of the necrotic debris. 10-19-2021 upon evaluation today patient appears to be doing better in regard to her wound which is actually measuring little bit smaller today. Fortunately there does not appear to be any signs of significant infection she does have quite a bit of callus we will get have to work on that today. Otherwise I think we are headed in the appropriate direction. 11-09-2021 upon evaluation today patient appears to be doing well in regard to her wound from the standpoint of infection and actually appears to be doing better she actually just completed the doxycycline. With that being said I  am hopeful that this took care of the osteomyelitis although that something that we will definitely keep a close eye on. With that being said I do believe that she is still having tremendous amount of callus buildup which I believe in turn is a result of her having issues here with Friction and callus buildup. With that being said I do believe that the patient would benefit here from a total contact cast I discussed that with her today. I do want to get more aggressive to get this closed as I think that if we do not then she is more likely to have a long standing issue here going forward. 11-17-2021 upon evaluation today patient appears to be doing well although her wound is still open. I do think that she is ready to be placed in a total contact [place], [name] ([id number]) [id number] Page 2 of 7 cast. She actually has come back on Thursday to have this changed out. Fortunately and overall I do not see any signs of worsening and I think that we should be able to get this on without any complication or problem.. 11-19-2021 upon evaluation today patient appears to be doing well currently in regard to her wound. She has been tolerating the dressing changes without complication and overall I am extremely pleased with where we stand today. I do not see any  evidence of active infection locally or systemically at this time which is great news. I think the total contact cast did an awesome job for her. 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 Only a very small area remains on the plantar foot. I had to use illumination to even see anything that looked open here. Much improved 11-[name]-2023 upon evaluation today patient appears to be doing well currently in regard to her wound. Of note she tells me that she did end up cutting off her cast with a pair of kitchen shears 2 nights ago. She states that she has been  walking on it more than she should have after the cast was put on due to a friend who ended up in the hospital that it caught her right after she had a put on that same day. She feels like she probably deformed the cast that it was causing some pressure points that she was afraid to get a blister and did not want another wound. For that reason she did go ahead and cut the soft and does not appear to have caused any damage. She tells me that it only took 7 minutes. With that being said I do think the wound actually looks really good she has a lot of callus buildup I am going to remove that just to make sure nothing side underneath but other than that overall she seems to be doing quite well. 12-07-2021 upon evaluation today patient's wound is actually showing signs of excellent improvement and very pleased with where we stand I think we are headed in the right direction. Fortunately there does not appear to be any signs of active infection at this time which is great news. No fevers, chills, nausea, vomiting, or diarrhea. 01-14-2022 upon evaluation today patient appears to be doing well currently in regard to her wound she does have a bit of callus buildup unfortunately but otherwise does not seem to show any signs of active infection locally or systemically at this time. Fortunately I do not see any evidence of infection locally or systemically which is great news. She does have a lot of callus buildup she has been very sick so has not been in to be seen recently. With that being said I do not think her wound is a whole lot worse than what it was before but it is also a lot more callused over so it is difficult to tell initially. 01-19-2022 upon evaluation today patient appears to be doing poorly unfortunately in regard to her wound. She has been tolerating the dressing changes without complication prior but unfortunately in the past week she has developed issues with redness and erythema on her foot with  a new wound on the top of her foot which was not there last time I saw her. Unfortunately this seems to be getting significantly worse she is also had temperatures ranging up to 103 although it was 99.9 here in the office today it was not nearly that bad. Fortunately I do not see any signs obvious of sepsis but nonetheless I am concerned about the significance of this infection I think she does need to probably get into the ER ASAP in order to be evaluated and treated appropriately based on what we are seeing. The patient is very worried about the situation here. With that being said she tells me that she has been wearing even prior to coming in today. 01-26-2022 upon evaluation today patient actually  appears to be doing significantly better despite the fact she did not go to the ER last week she tells me she had a lot going on with family and otherwise just was not able to make it. Fortunately there does not appear to be any signs of infection locally or systemically at this time. 02-16-2022 upon evaluation today patient appears to be doing well the wound on the top of her foot is looking smaller the wound on the bottom/medial portion of her foot is going require some debridement today to clearway some of the necrotic debris but overall looks to be doing quite well. I do not see any evidence of infection locally nor systemically at this time which is great news. 03-02-2022 upon evaluation today patient appears to be doing poorly in regard to her foot. It actually appears that she is probably infected based on what I am seeing and I think that she is going require some intervention in this regard. I think that we will probably put her back on doxycycline and also believe that she really needs an MRI repeated it has not been done since June antibiotics even then she showed some signs of osteomyelitis I feel like this may be getting worse. She still has not gone to the hospital as previously recommended  although the infection did seem to clear with the doxycycline which is why I am going to go ahead and redo that she also states that she is not to be able to go to the hospital today. 03-09-2022 upon evaluation today patient appears to be doing well with regard to her foot unfortunately she is not doing nearly as well in regard to her leg which is showing signs of swelling. She tells me she has been having issues with her knee she wants to see orthopedics she has not gone anywhere as of yet I do think she should go to [facility] for further evaluation and treatment. She voiced understanding. With that being said again her foot is doing well mainly because she has been off of her leg due to the pain in her knee and I think this has been one of the primary issues here at this point. 3/5; the patient's wound today has healed but as noted she has not seen orthopedics with regard to swelling. After her recent knee surgery. Electronic Signature(s) Signed: 03/23/2022 4:03:34 PM By: Linton Ham MD Entered By: Linton Ham on 03/23/2022 10:20:46 -------------------------------------------------------------------------------- Physical Exam Details Patient Name: Date of Service: [name], Kelly Mons. 03/23/2022 9:15 A [name]: [id number] Patient Account Number: [id number] Date of Birth/Sex: Treating RN: [birth year] (47 y.o. [name] Primary Care Provider: [name] Other Clinician: [name] Referring Provider: Treating Provider/Extender: RO BSO N, MICHA EL Carolan Shiver, Eric Weeks in Treatment: 30 Constitutional Sitting or standing Blood Pressure is within target range for patient.. Pulse regular and within target range for patient.Marland Kitchen Respirations regular, non-labored and within target range.. Temperature is normal and within the target range for the patient.Marland Kitchen appears in no distress. [name], [name] ([id number]) [id number] Page 3 of  7 Notes Wound exam; the wound on the plantar aspect of her foot has closed. The amputated toe site is also closed. Her knee surgery site looks fine Electronic Signature(s) Signed: 03/23/2022 4:03:34 PM By: Linton Ham MD Entered By: Linton Ham on 03/23/2022 10:22:11 -------------------------------------------------------------------------------- Physician Orders Details Patient Name: Date of Service: [name], Kelly Mons. 03/23/2022 9:15 A [name]: [id number] Patient Account Number:  [id number] Date of Birth/Sex: Treating RN: [name][birth year] (47 y.o. [name] Primary Care Provider: [name] Other Clinician: [name] Referring Provider: Treating Provider/Extender: RO BSO Delane Ginger, MICHA EL Carolan Shiver, Randel Pigg in Treatment: 30 Verbal / Phone Orders: No Diagnosis Coding Discharge From [facility] Services Discharge from [place] on your wound has healed Additional Orders / Instructions Other: - keep area covered with padded bandage at all times for protection Electronic Signature(s) Signed: 03/23/2022 4:03:34 PM By: Linton Ham MD Signed: 03/24/2022 4:49:21 PM By: [name] Entered By: [name] on 03/23/2022 09:51:52 -------------------------------------------------------------------------------- Problem List Details Patient Name: Date of Service: [name], Kelly Mons. 03/23/2022 9:15 A [name]: [id number] Patient Account Number: [id number] Date of Birth/Sex: Treating RN: [name] (47 y.o. 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 03/23/2022 9:15 A [name]: [id number] Patient Account Number: [id number] Date of Birth/Sex: Treating RN: [name][birth year] (47 y.o. [name] Primary Care Provider: [name] Other Clinician: [name] Referring Provider: Treating Provider/Extender: RO BSO N, MICHA EL Carolan Shiver, Randel Pigg in Treatment: 30 Subjective History of Present Illness (HPI) 47 year old patient sent to [name] by her PCP 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 Her notes were reviewed and there was extensive history of opioid treatment and management by the [facility] pain clinic from where she has been terminated. X-ray of the right foot foot was done on 03/13/2015 and was suggestive of active infection and possible osteomyelitis. her past surgical history includes amputation of the left big toe in 2011 and amputation of the right big toe in 2015. She was sitting the wound clinic at [facility] and we will try and obtain these notes. She is a smoker and smokes about 15 cigarettes a day. 06/12/2015 - x-ray of the right foot -- IMPRESSION:1. Soft tissue swelling of the plantar surface of the foot. No underlying  acute abnormality. 2. Amputation deformity again of the right great toe. Old healed bony deformity noted the right second metatarsal. No acute bony abnormality identified. We have not received any reports from the [facility] hospital yet. She continues to smoke at least 10 cigarettes a day 06/19/2015 -- she has increasing pain in the right foot and also has had some low-grade fever. Her vascular test is scheduled for tomorrow and her MRIs not till next week. Readmission: 08-20-2021 upon evaluation today patient presents for initial inspection here in our clinic although she has been seen that was back in 2017. At that time she was having a issue with her right foot. With that being said  upon inspection today she is actually having issues with the right foot as well on the medial aspect where she has some breakdown due to what I am assuming is pressure to the foot location. 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 I did review her culture report which showed evidence of bacteria including hemolytic Streptococcus group A as well as Staphylococcus aureus. The patient is currently on doxycycline and tells me that this is getting much better. T opical gentamicin is also being utilized. Also did review her arterial studies which show that she has on the right and ABI of 1.15 which is triphasic and appears to be excellent there is no significant peripheral artery disease noted the left is also. [name], [name] ([id number]) [id number] Page 5 of 7 With ABI of 1.11 and a good report in that regard as well. Lastly I did also review her MRI of the right forefoot which was performed on 07-06-2021. This shows that she does have mild subcortical marrow edema in the stump of the first metatarsal concerning for  osteomyelitis. There is also a malunited fracture of the second metatarsal. The patient has bone marrow edema in the base of the second third fourth and fifth metatarsals concerning for stress reaction obviously this infection is going to be an issue for [name] here as well she needs to be treated appropriately and I do believe that the doxycycline is a good option right now although I may need to #1 extend this and #2 plan for even altering or adding to the treatment regimen depending on how things progress. Patient voiced understanding.Marland Kitchen 09-01-2021 upon evaluation today patient appears to be doing excellent in regard to her wound. She is actually showing signs of excellent improvement which is great news. Fortunately I do not see any signs of active infection locally or systemically at this point which is great news and overall I am extremely pleased with where things stand today. I do not see any evidence of active infection locally or systemically. 09-07-2021 upon evaluation today patient appears to be doing well with regard to her foot ulcer. This is actually showing signs of improvement which is great news. Fortunately there does not appear to be any signs of active infection locally or systemically at this time which is as well. 09-22-2021 upon evaluation today patient appears to be doing well currently in regard to her wound. This is measuring smaller and looking better were slowly making progress here. Fortunately there does not appear to be any evidence of active infection locally or systemically at this time. Upon inspection patient's wound bed showed evidence of good granulation and epithelization at this point. 10-05-2021 upon evaluation today patient's wound shows a lot of callus buildup but the surface of the wound is actually doing quite well. She is going require some sharp debridement to clearway some of the necrotic debris. 10-19-2021 upon evaluation today patient appears to be doing better  in regard to her wound which is actually measuring little bit smaller today. Fortunately there does not appear to be any signs of significant infection she does have quite a bit of callus we will get have to work on that today. Otherwise I think we are headed in the appropriate direction. 11-09-2021 upon evaluation today patient appears to be doing well in regard to her wound from the  standpoint of infection and actually appears to be doing better she actually just completed the doxycycline. With that being said I am hopeful that this took care of the osteomyelitis although that something that we will definitely keep a close eye on. With that being said I do believe that she is still having tremendous amount of callus buildup which I believe in turn is a result of her having issues here with Friction and callus buildup. With that being said I do believe that the patient would benefit here from a total contact cast I discussed that with her today. I do want to get more aggressive to get this closed as I think that if we do not then she is more likely to have a long standing issue here going forward. 11-17-2021 upon evaluation today patient appears to be doing well although her wound is still open. I do think that she is ready to be placed in a total contact cast. She actually has come back on Thursday to have this changed out. Fortunately and overall I do not see any signs of worsening and I think that we should be able to get this on without any complication or problem.. 11-19-2021 upon evaluation today patient appears to be doing well currently in regard to her wound. She has been tolerating the dressing changes without complication and overall I am extremely pleased with where we stand today. I do not see any evidence of active infection locally or systemically at this time which is great news. I think the total contact cast did an awesome job for her. 11/9; right foot in the setting of idiopathic  peripheral neuropathy and Charcot deformity previous amputations. Only a very small area remains on the plantar foot. I had to use illumination to even see anything that looked open here. Much improved 11-[name]-2023 upon evaluation today patient appears to be doing well currently in regard to her wound. Of note she tells me that she did end up cutting off her cast with a pair of kitchen shears 2 nights ago. She states that she has been walking on it more than she should have after the cast was put on due to a friend who ended up in the hospital that it caught her right after she had a put on that same day. She feels like she probably deformed the cast that it was causing some pressure points that she was afraid to get a blister and did not want another wound. For that reason she did go ahead and cut the soft and does not appear to have caused any damage. She tells me that it only took 7 minutes. With that being said I do think the wound actually looks really good she has a lot of callus buildup I am going to remove that just to make sure nothing side underneath but other than that overall she seems to be doing quite well. 12-07-2021 upon evaluation today patient's wound is actually showing signs of excellent improvement and very pleased with where we stand I think we are headed in the right direction. Fortunately there does not appear to be any signs of active infection at this time which is great news. No fevers, chills, nausea, vomiting, or diarrhea. 01-14-2022 upon evaluation today patient appears to be doing well currently in regard to her wound she does have a bit of callus buildup unfortunately but otherwise does not seem to show any signs of active infection locally or systemically at this time. Fortunately I do  not see any evidence of infection locally or systemically which is great news. She does have a lot of callus buildup she has been very sick so has not been in to be seen recently. With that  being said I do not think her wound is a whole lot worse than what it was before but it is also a lot more callused over so it is difficult to tell initially. 01-19-2022 upon evaluation today patient appears to be doing poorly unfortunately in regard to her wound. She has been tolerating the dressing changes without complication prior but unfortunately in the past week she has developed issues with redness and erythema on her foot with a new wound on the top of her foot which was not there last time I saw her. Unfortunately this seems to be getting significantly worse she is also had temperatures ranging up to 103 although it was 99.9 here in the office today it was not nearly that bad. Fortunately I do not see any signs obvious of sepsis but nonetheless I am concerned about the significance of this infection I think she does need to probably get into the ER ASAP in order to be evaluated and treated appropriately based on what we are seeing. The patient is very worried about the situation here. With that being said she tells me that she has been wearing even prior to coming in today. 01-26-2022 upon evaluation today patient actually appears to be doing significantly better despite the fact she did not go to the ER last week she tells me she had a lot going on with family and otherwise just was not able to make it. Fortunately there does not appear to be any signs of infection locally or systemically at this time. 02-16-2022 upon evaluation today patient appears to be doing well the wound on the top of her foot is looking smaller the wound on the bottom/medial portion of her foot is going require some debridement today to clearway some of the necrotic debris but overall looks to be doing quite well. I do not see any evidence of infection locally nor systemically at this time which is great news. 03-02-2022 upon evaluation today patient appears to be doing poorly in regard to her foot. It actually appears that  she is probably infected based on what I am seeing and I think that she is going require some intervention in this regard. I think that we will probably put her back on doxycycline and also believe that she really needs an MRI repeated it has not been done since June antibiotics even then she showed some signs of osteomyelitis I feel like this may be getting worse. She still has not gone to the hospital as previously recommended although the infection did seem to clear with the doxycycline which is why I am going to go ahead and redo that she also states that she is not to be able to go to the hospital today. 03-09-2022 upon evaluation today patient appears to be doing well with regard to her foot unfortunately she is not doing nearly as well in regard to her leg which is showing signs of swelling. She tells me she has been having issues with her knee she wants to see orthopedics she has not gone anywhere as of yet I do think she should go to [facility] for further evaluation and treatment. She voiced understanding. With that being said again her foot is doing well mainly because she has been off of her leg  due to the pain in her knee and I think this has been one of the primary issues here at this point. 3/5; the patient's wound today has healed but as noted she has not seen orthopedics with regard to swelling. After her recent knee surgery. [name], [name] ([id number]) [id number] Page 6 of 7 Objective Constitutional Sitting or standing Blood Pressure is within target range for patient.. Pulse regular and within target range for patient.Marland Kitchen Respirations regular, non-labored and within target range.. Temperature is normal and within the target range for the patient.Marland Kitchen appears in no distress. Vitals Time Taken: 9:23 AM, Height: 67 in, Weight: 153 lbs, BMI: 24, Temperature: 99.7 F, Pulse: 98 bpm, Respiratory Rate: 18 breaths/min, Blood Pressure: 106/61 mmHg. General Notes:  Patient reports feeling good, she was chewing peppermint gum General Notes: Wound exam; the wound on the plantar aspect of her foot has closed. The amputated toe site is also closed. Her knee surgery site looks fine Integumentary (Hair, Skin) Wound #2 status is Healed - Epithelialized. Original cause of wound was Trauma. The date acquired was: 06/18/2021. The wound has been in treatment 30 weeks. The wound is located on the Plantar Foot. The wound measures 0cm length x 0cm width x 0cm depth; 0cm^2 area and 0cm^3 volume. There is Fat Layer (Subcutaneous Tissue) exposed. There is a none present amount of drainage noted. There is no granulation within the wound bed. There is no necrotic tissue within the wound bed. 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 The wound on the left heel has closed 2. I told her to buy plantar foot supports I am uncertain whether she is going to do this. 3. She comes into the clinic in [place], I have already told her that I do not like them. 4. Nevertheless she can be discharged. Electronic Signature(s) Signed: 03/23/2022 4:03:34 PM By: Linton Ham MD Entered By: Linton Ham on 03/23/2022 10:24:34 -------------------------------------------------------------------------------- SuperBill Details Patient Name: Date of Service: [name], Kelly Mons. 03/23/2022 Medical Record Number: [id number] Patient Account Number: [id number] Date of Birth/Sex: Treating RN: [name], [name] (47 y.o. [name] Primary Care  Provider: [name] Other Clinician: [name] [facility], [name] ([id number]) [id number] Page 7 of 7 Referring Provider: Treating Provider/Extender: RO BSO N, MICHA EL [name] in Treatment: 30 Diagnosis Coding ICD-10 Codes Code Description [phone] Other chronic osteomyelitis, right ankle and foot L97.512 Non-pressure chronic ulcer of other part of right foot with fat layer exposed G71.00 Muscular dystrophy, unspecified G60.0 Hereditary motor and sensory neuropathy L84 Corns and callosities Facility Procedures : CPT4 Code: [id number] Description: [id number] - WOUND CARE VISIT-LEV 2 EST PT Modifier: Quantity: 1 Physician Procedures : CPT4 Code Description Modifier [id number] - WC PHYS LEVEL 2 - EST PT ICD-10 Diagnosis Description L97.512 Non-pressure chronic ulcer of other part of right foot with fat layer exposed Quantity: 1 Electronic Signature(s) Signed: 03/23/2022 4:03:34 PM By: Linton Ham MD Entered By: Linton Ham on 03/23/2022 10:24:51

## 2022-03-26 NOTE — Progress Notes (Signed)
[name] ([id number]) [id number] Page 1 of 9 Visit Report for 03/23/2022 Arrival Information Details Patient Name: Date of Service: [name], [name] 03/23/2022 9:15 A M Medical Record Number: [id number] Patient Account Number: [id number] Date of Birth/Sex: Treating RN: 1975/05/26 (47 y.o. [name] Primary Care Rufus Cypert: [name] Other Clinician: [name] Referring Mykell Rawl: Treating Clearence Vitug/Extender: RO BSO N, MICHA EL Carolan Shiver, Randel Pigg in Treatment: 76 Visit Information History Since Last Visit All ordered tests and consults were completed: No Patient Arrived: Gilford Rile Added or deleted any medications: No Arrival Time: 09:20 Any new allergies or adverse reactions: No Transfer Assistance: None Had a fall or experienced change in No Patient Identification Verified: Yes activities of daily living that may affect Secondary Verification Process Completed: Yes risk of falls: Patient Requires Transmission-Based Precautions: No Signs or symptoms of abuse/neglect since last visito No Patient Has Alerts: Yes Hospitalized since last visit: No Patient Alerts: ABI R 1.15 02/04/21 Implantable device outside of the clinic excluding No ABI L 1.11 02/04/21 cellular tissue based products placed in the center since last visit: Has Dressing in Place as Prescribed: Yes Pain Present Now: Yes Electronic Signature(s) Signed: 03/24/2022 4:49:21 PM By: [name] Entered By: [name] on 03/23/2022 09:21:24 -------------------------------------------------------------------------------- Clinic Level of Care Assessment Details Patient Name: Date of Service: Kelly Penna. 03/23/2022 9:15 A M Medical Record Number: [id number] Patient Account Number: [id number] Date of Birth/Sex: Treating RN: 28-Apr-1975 (47 y.o. [name] Primary Care Diannia Hogenson: [name] Other Clinician: [name] Referring Daryel Kenneth: Treating  Io Dieujuste/Extender: RO BSO N, Cisco EL Carolan Shiver, Randel Pigg in Treatment: 30 Clinic Level of Care Assessment Items TOOL 4 Quantity Score '[]'$  - 0 Use when only an EandM is performed on FOLLOW-UP visit ASSESSMENTS - Nursing Assessment / Reassessment X- 1 10 Reassessment of Co-morbidities (includes updates in patient status) X- 1 5 Reassessment of Adherence to Treatment Plan Guidroz, Shayma M ([id number]) [id number] Page 2 of 9 ASSESSMENTS - Wound and Skin A ssessment / Reassessment X - Simple Wound Assessment / Reassessment - one wound 1 5 '[]'$  - 0 Complex Wound Assessment / Reassessment - multiple wounds '[]'$  - 0 Dermatologic / Skin Assessment (not related to wound area) ASSESSMENTS - Focused Assessment '[]'$  - 0 Circumferential Edema Measurements - multi extremities '[]'$  - 0 Nutritional Assessment / Counseling / Intervention '[]'$  - 0 Lower Extremity Assessment (monofilament, tuning fork, pulses) '[]'$  - 0 Peripheral Arterial Disease Assessment (using hand held doppler) ASSESSMENTS - Ostomy and/or Continence Assessment and Care '[]'$  - 0 Incontinence Assessment and Management '[]'$  - 0 Ostomy Care Assessment and Management (repouching, etc.) PROCESS - Coordination of Care X - Simple Patient / Family Education for ongoing care 1 15 '[]'$  - 0 Complex (extensive) Patient / Family Education for ongoing care '[]'$  - 0 Staff obtains Programmer, systems, Records, T Results / Process Orders est '[]'$  - 0 Staff telephones HHA, Nursing Homes / Clarify orders / etc '[]'$  - 0 Routine Transfer to another Facility (non-emergent condition) '[]'$  - 0 Routine Hospital Admission (non-emergent condition) '[]'$  - 0 New Admissions / Biomedical engineer / Ordering NPWT Apligraf, etc. , '[]'$  - 0 Emergency Hospital Admission (emergent condition) X- 1 10 Simple Discharge Coordination '[]'$  - 0 Complex (extensive) Discharge Coordination PROCESS - Special Needs '[]'$  - 0 Pediatric / Minor Patient Management '[]'$   - 0 Isolation Patient Management '[]'$  - 0 Hearing / Language / Visual special needs '[]'$  - 0 Assessment of Community assistance (transportation, D/C planning, etc.) '[]'$  -  0 Additional assistance / Altered mentation '[]'$  - 0 Support Surface(s) Assessment (bed, cushion, seat, etc.) INTERVENTIONS - Wound Cleansing / Measurement X - Simple Wound Cleansing - one wound 1 5 '[]'$  - 0 Complex Wound Cleansing - multiple wounds X- 1 5 Wound Imaging (photographs - any number of wounds) '[]'$  - 0 Wound Tracing (instead of photographs) '[]'$  - 0 Simple Wound Measurement - one wound '[]'$  - 0 Complex Wound Measurement - multiple wounds INTERVENTIONS - Wound Dressings X - Small Wound Dressing one or multiple wounds 1 10 '[]'$  - 0 Medium Wound Dressing one or multiple wounds '[]'$  - 0 Large Wound Dressing one or multiple wounds '[]'$  - 0 Application of Medications - topical '[]'$  - 0 Application of Medications - injection INTERVENTIONS - Miscellaneous '[]'$  - 0 External ear exam Debroux, Yalonda M ([id number]) [id number] Page 3 of 9 '[]'$  - 0 Specimen Collection (cultures, biopsies, blood, body fluids, etc.) '[]'$  - 0 Specimen(s) / Culture(s) sent or taken to Lab for analysis '[]'$  - 0 Patient Transfer (multiple staff / Harrel Lemon Lift / Similar devices) '[]'$  - 0 Simple Staple / Suture removal (25 or less) '[]'$  - 0 Complex Staple / Suture removal (26 or more) '[]'$  - 0 Hypo / Hyperglycemic Management (close monitor of Blood Glucose) '[]'$  - 0 Ankle / Brachial Index (ABI) - do not check if billed separately X- 1 5 Vital Signs Has the patient been seen at the hospital within the last three years: Yes Total Score: 70 Level Of Care: New/Established - Level 2 Electronic Signature(s) Signed: 03/24/2022 4:49:21 PM By: [name] Entered By: [name] on 03/23/2022 09:52:20 -------------------------------------------------------------------------------- Encounter Discharge Information Details Patient Name:  Date of Service: [name], [name]. 03/23/2022 9:15 A M Medical Record Number: [id number] Patient Account Number: [id number] Date of Birth/Sex: Treating RN: 1975-10-19 (47 y.o. Charolette Forward, Kim Primary Care Kalik Hoare: [name] Other Clinician: [name] Referring Harlie Ragle: Treating Kandas Oliveto/Extender: RO BSO N, MICHA EL Carolan Shiver, Randel Pigg in Treatment: 30 Encounter Discharge Information Items Discharge Condition: Stable Ambulatory Status: Walker Discharge Destination: Home Transportation: Private Auto Accompanied By: self Schedule Follow-up Appointment: Yes Clinical Summary of Care: Electronic Signature(s) Signed: 03/24/2022 4:49:21 PM By: [name] Entered By: [name] on 03/23/2022 09:59:57 Lower Extremity Assessment Details -------------------------------------------------------------------------------- [name] ([id number]) [id number] Page 4 of 9 Patient Name: Date of Service: Kelly Penna. 03/23/2022 9:15 A M Medical Record Number: [id number] Patient Account Number: [id number] Date of Birth/Sex: Treating RN: 06-17-1975 (47 y.o. [name] Primary Care Erionna Strum: [name] Other Clinician: [name] Referring Veda Arrellano: Treating Rayane Gallardo/Extender: RO BSO N, MICHA EL Carolan Shiver, Eric Weeks in Treatment: 30 Edema Assessment Left: Right: Assessed: No Yes Edema: Yes Calf Left: Right: Point of Measurement: 33 cm From Medial Instep 42 cm Ankle Left: Right: Point of Measurement: 10 cm From Medial Instep 25.8 cm Knee To Floor Left: Right: From Medial Instep 50 cm Vascular Assessment Left: Right: Pulses: Dorsalis Pedis Palpable: Yes Electronic Signature(s) Signed: 03/24/2022 4:49:21 PM By: [name] Signed: 03/24/2022 9:49:41 PM By: Gretta Cool, BSN, RN, CWS, Kim RN, BSN Entered By: [name] on 03/23/2022  09:33:02 -------------------------------------------------------------------------------- Multi Wound Chart Details Patient Name: Date of Service: [name], [name]. 03/23/2022 9:15 A M Medical Record Number: [id number] Patient Account Number: [id number] Date of Birth/Sex: Treating RN: 08-11-1975 (47 y.o. [name] Primary Care Kailoni Vahle: [name] Other Clinician: [name] Referring Kaydie Petsch: Treating Kain Milosevic/Extender: RO BSO N, Casey EL Carolan Shiver, Randel Pigg  in Treatment: 30 Vital Signs Height(in): 67 Pulse(bpm): 98 Weight(lbs): 153 Blood Pressure(mmHg): 106/61 Body Mass Index(BMI): 24 Temperature(F): 99.7 Respiratory Rate(breaths/min): 18 [2:Photos:] [N/A:N/A] Plantar Foot N/A N/A Wound Location: Trauma N/A N/A Wounding Event: Abscess N/A N/A Primary Etiology: Anemia, Asthma, History of pressure N/A N/A Comorbid History: wounds, Osteomyelitis, Neuropathy 06/18/2021 N/A N/A Date Acquired: 30 N/A N/A Weeks of Treatment: Open N/A N/A Wound Status: No N/A N/A Wound Recurrence: 0.1x0.1x0.1 N/A N/A Measurements L x W x D (cm) 0.008 N/A N/A A (cm) : rea 0.001 N/A N/A Volume (cm) : 98.90% N/A N/A % Reduction in Area: 99.60% N/A N/A % Reduction in Volume: Full Thickness Without Exposed N/A N/A Classification: Support Structures Medium N/A N/A Exudate Amount: Serosanguineous N/A N/A Exudate Type: red, brown N/A N/A Exudate Color: Medium (34-66%) N/A N/A Granulation Amount: Pink N/A N/A Granulation Quality: Medium (34-66%) N/A N/A Necrotic Amount: Fat Layer (Subcutaneous Tissue): Yes N/A N/A Exposed Structures: Fascia: No Tendon: No Muscle: No Joint: No Bone: No Medium (34-66%) N/A N/A Epithelialization: Treatment Notes Electronic Signature(s) Signed: 03/24/2022 4:49:21 PM By: [name] Entered By: [name] on 03/23/2022  09:34:06 -------------------------------------------------------------------------------- Multi-Disciplinary Care Plan Details Patient Name: Date of Service: [name], [name]. 03/23/2022 9:15 A M Medical Record Number: [id number] Patient Account Number: [id number] Date of Birth/Sex: Treating RN: July 05, 1975 (47 y.o. [name] Primary Care Abbas Beyene: [name] Other Clinician: [name] Referring Samya Siciliano: Treating Miguel Medal/Extender: RO BSO Delane Ginger, MICHA EL Carolan Shiver, Randel Pigg in Treatment: 30 Active Inactive Electronic Signature(s) Signed: 03/24/2022 4:49:21 PM By: [name] Signed: 03/24/2022 9:49:41 PM By: Gretta Cool, BSN, RN, CWS, Kim RN, BSN Entered By: [name] on 03/23/2022 09:59:16 Hovatter, [name] ([id number]) [id number] Page 6 of 9 -------------------------------------------------------------------------------- Pain Assessment Details Patient Name: Date of Service: Kelly Penna. 03/23/2022 9:15 A M Medical Record Number: [id number] Patient Account Number: [id number] Date of Birth/Sex: Treating RN: [birth year] (47 y.o. [name] Primary Care Timothy Trudell: [name] Other Clinician: [name] Referring Elizette Shek: Treating Adaleah Forget/Extender: RO BSO N, Preble EL Carolan Shiver, Randel Pigg in Treatment: 30 Active Problems Location of Pain Severity and Description of Pain Patient Has Paino Yes Site Locations Pain Location: Generalized Pain Duration of the Pain. Constant / Intermittento Constant Rate the pain. Current Pain Level: 7 Character of Pain Describe the Pain: Throbbing Pain Management and Medication Current Pain Management: Medication: Yes Cold Application: No Rest: No Massage: No Activity: No T.E.N.S.: No Heat Application: No Leg drop or elevation: No Is the Current Pain Management Adequate: Inadequate How does your wound impact your activities of daily livingo Sleep: No Bathing: No Appetite:  No Relationship With Others: No Bladder Continence: No Emotions: No Bowel Continence: No Work: No Toileting: No Drive: No Dressing: No Hobbies: No Notes Patient had right knee surgery 03/13/22. Pain is coming from post surgery Electronic Signature(s) Signed: 03/24/2022 4:49:21 PM By: [name] Signed: 03/24/2022 9:49:41 PM By: Gretta Cool, BSN, RN, CWS, Kim RN, BSN Entered By: [name] on 03/23/2022 [id number] [name] ([id number]) [id number] Page 7 of 9 -------------------------------------------------------------------------------- Patient/Caregiver Education Details Patient Name: Date of Service: [name] [name] 3/5/2024andnbsp9:15 A M Medical Record Number: [id number] Patient Account Number: [id number] Date of Birth/Gender: Treating RN: 03-05-1975 (47 y.o. [name] Primary Care Physician: [name] Other Clinician: [name] Referring Physician: Treating Physician/Extender: RO BSO N, [place] EL Carolan Shiver, Randel Pigg in Treatment: 30 Education Assessment Education Provided To: Patient Education Topics Provided Offloading: Handouts: Other: keep  padded bandage on area for protection Methods: Explain/Verbal Responses: State content correctly Wound/Skin Impairment: Handouts: Other: wound has healed. Please call if any further issues arise Methods: Explain/Verbal Responses: State content correctly Electronic Signature(s) Signed: 03/24/2022 4:49:21 PM By: [name] Entered By: [name] on 03/23/2022 09:58:54 -------------------------------------------------------------------------------- Wound Assessment Details Patient Name: Date of Service: [name], [name]. 03/23/2022 9:15 A M Medical Record Number: [id number] Patient Account Number: [id number] Date of Birth/Sex: Treating RN: [birth year] (47 y.o. [name] Primary Care Norita Meigs: [name] Other Clinician: [name] Referring  Zanetta Dehaan: Treating Jenipher Havel/Extender: RO BSO N, MICHA EL Carolan Shiver, Eric Weeks in Treatment: 30 Wound Status Wound Number: 2 Primary Abscess Etiology: Wound Location: Plantar Foot Wound Status: Healed - Epithelialized Wounding Event: Trauma Comorbid Anemia, Asthma, History of pressure wounds, Osteomyelitis, Date Acquired: 06/18/2021 History: Neuropathy Weeks Of Treatment: 30 Clustered Wound: No Schwebke, Janann M ([id number]) [id number] Page 8 of 9 Photos Wound Measurements Length: (cm) Width: (cm) Depth: (cm) Area: (cm) Volume: (cm) 0 % Reduction in Area: 100% 0 % Reduction in Volume: 100% 0 Epithelialization: Large (67-100%) 0 0 Wound Description Classification: Full Thickness Without Exposed Support Exudate Amount: None Present Structures Foul Odor After Cleansing: No Slough/Fibrino No Wound Bed Granulation Amount: None Present (0%) Exposed Structure Necrotic Amount: None Present (0%) Fascia Exposed: No Fat Layer (Subcutaneous Tissue) Exposed: Yes Tendon Exposed: No Muscle Exposed: No Joint Exposed: No Bone Exposed: No Treatment Notes Wound #2 (Foot) Wound Laterality: Plantar Cleanser Peri-Wound Care Topical Primary Dressing Secondary Dressing Secured With Compression Wrap Compression Stockings Add-Ons Electronic Signature(s) Signed: 03/24/2022 4:49:21 PM By: [name] Signed: 03/24/2022 9:49:41 PM By: Gretta Cool, BSN, RN, CWS, Kim RN, BSN Entered By: [name] on 03/23/2022 09:49:11 Kutch, Henretter M ([id number]) [id number] Page 9 of 9 -------------------------------------------------------------------------------- Vitals Details Patient Name: Date of Service: Kelly Penna. 03/23/2022 9:15 A M Medical Record Number: [id number] Patient Account Number: [id number] Date of Birth/Sex: Treating RN: Jul 19, 1975 (47 y.o. Charolette Forward, Kim Primary Care Hartleigh Edmonston: [name] Other Clinician: [name] Referring Jenaya Saar: Treating Jhordyn Hoopingarner/Extender: RO BSO N, MICHA EL Carolan Shiver, Eric Weeks in Treatment: 30 Vital Signs Time Taken: 09:23 Temperature (F): 99.7 Height (in): 67 Pulse (bpm): 98 Weight (lbs): 153 Respiratory Rate (breaths/min): 18 Body Mass Index (BMI): 24 Blood Pressure (mmHg): 106/61 Reference Range: 80 - 120 mg / dl Notes Patient reports feeling good, she was chewing peppermint gum Electronic Signature(s) Signed: 03/24/2022 4:49:21 PM By: [name] Entered By: [name] on 03/23/2022 09:26:07

## 2022-03-29 ENCOUNTER — Ambulatory Visit (INDEPENDENT_AMBULATORY_CARE_PROVIDER_SITE_OTHER): Payer: Medicare HMO | Admitting: Psychology

## 2022-03-29 DIAGNOSIS — F331 Major depressive disorder, recurrent, moderate: Secondary | ICD-10-CM

## 2022-03-29 NOTE — Progress Notes (Signed)
[name] is a 47 y.o. female patient .  Diagnosis 296.30 (Major depressive affective disorder, recurrent episode, unspecified) [n/a]  Symptoms Depressed or irritable mood. (Status: maintained) -- No Description Entered  Diminished interest in or enjoyment of activities. (Status: maintained) -- No Description Entered  Feelings of hopelessness, worthlessness, or inappropriate guilt. (Status: maintained) -- No Description Entered  Lack of energy. (Status: maintained) -- No Description Entered  Unresolved grief issues. (Status: maintained) -- No Description Entered  Medication Status compliance  Safety none  If Suicidal or Homicidal State Action Taken: unspecified  Current Risk: low Medications Hydroxyzine (Dosage: unknown)  Remiron (Dosage: unknown)  Sertraline (Dosage: unknown)  Objectives Related Problem: Appropriately grieve the loss in order to normalize mood and to return to previously adaptive level of functioning. Description: Increasingly verbalize hopeful and positive statements regarding self, others, and the future. Target Date: 2022-07-15 Frequency: Daily Modality: individual Progress: 90%  Related Problem: Appropriately grieve the loss in order to normalize mood and to return to previously adaptive level of functioning. Description: Learn and implement conflict resolution skills to resolve interpersonal problems. Target Date: 2022-07-15 Frequency: Daily Modality: individual Progress: 85%  Related Problem: Appropriately grieve the loss in order to normalize mood and to return to previously adaptive level of functioning. Description: Learn and implement problem-solving and decision-making skills. Target Date: 2022-07-15 Frequency: Daily Modality: individual Progress: 85%  Related  Problem: Appropriately grieve the loss in order to normalize mood and to return to previously adaptive level of functioning. Description: Describe current and past experiences with depression including their impact on functioning and attempts to resolve it. Target Date: 2021-07-14 Frequency: Daily Modality: individual Progress: 100%-completed  Related Problem: Complete the process of letting go of the lost significant other. Description: Report decreased time spent each day focusing on the loss. Target Date: 2022-07-15 Frequency: Daily Modality: individual Progress: 85%  Related Problem: Complete the process of letting go of the lost significant other. Description: Decrease unrealistic thoughts, statements, and feelings of being responsible for the loss. Target Date: 2021-07-14 Frequency: Daily Modality: individual Progress: 100%-completed  Related Problem: Complete the process of letting go of the lost significant other. Description: Verbalize and resolve feelings of anger or guilt focused on self or deceased loved one that interfere with the grieving process. Target Date: 2021-07-14 Frequency: Daily Modality: individual Progress: 100%-completed  Related Problem: Complete the process of letting go of the lost significant other. Description: Begin verbalizing feelings associated with the loss. Target Date: 2022-07-15 Frequency: Daily Modality: individual Progress: 80%  Related Problem: Complete the process of letting go of the lost significant other. Description: Identify what stages of grief have been experienced in the continuum of the grieving process. Target Date: 2021-07-14 Frequency: Daily Modality: individual Progress: 100%-completed  Related Problem: Complete the process of letting go of the lost significant other. Description: Participate in a therapy that addresses issues beyond grief that have arisen as a result of the loss. Target Date: 2022-07-15 Frequency:  Daily  Modality: individual Progress: 85%  Related Problem: Complete the process of letting go of the lost significant other. Description: Tell in detail the story of the current loss that is triggering symptoms. Target Date: 2021-07-14 Frequency: Daily Modality: individual Progress: 100%-completed  Client Response full compliance  Service Location Location, [address] [place], [place] [address]  Service Code cpt [phone]  Validate/empathize  Related past to present  Behavioral activation plan  Facilitate problem solving  Identify automatic thoughts  Rationally challenge thoughts or beliefs/cognitive restructuring  Identify/label emotions  Emotion regulation skills  Psychiatrist  Self care activities  Self-monitoring  Session Notes: Patient requests a video Webex session. She is at home and I am at my home office.  Dx: Major Depression  Meds: Lexapro ('20mg'$ ), Busperone (7.5)  Goals: States that she is seeking counseling to learn to manage the grief reaction to losing her husband. She reports feeling tired all the time and that she has no energy or motivation. Needs to develop coping strategies. Therapy to focus on reducing despair, sadness, hopelessness and helplessness. 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 Will explore those early traumas/losses and current triggers. Goal dates: 04-2021 Revised Goals:6-24 Patient states she had knee surgery due to an infection. School had to be put on back burner again. She says she will call school today to see about restarting. Maintains she is committed to completing her studies. She says that her parents have been "amazing" in taking care of her post-surgery. She is very gratified about how well the recovery went with them. She states "I like who I am now", which is very different than she felt in the past. She is also able to feel "grateful" for what she has and no longer  focuses on the negative and what she "does not have". Improvement has been significant and she reports that her depressive symptom from last month are not present even though she made no medicine changes. Will continue to monitor.                                                       [name], PhD Time: 7:40a-8:30a 50 minutes

## 2022-03-30 ENCOUNTER — Encounter: Payer: Self-pay | Admitting: Family Medicine

## 2022-03-30 ENCOUNTER — Ambulatory Visit (INDEPENDENT_AMBULATORY_CARE_PROVIDER_SITE_OTHER): Payer: Medicare HMO | Admitting: Family Medicine

## 2022-03-30 VITALS — BP 122/74 | HR 89 | Temp 99.5°F | Ht 67.0 in | Wt 157.0 lb

## 2022-03-30 DIAGNOSIS — F419 Anxiety disorder, unspecified: Secondary | ICD-10-CM | POA: Diagnosis not present

## 2022-03-30 DIAGNOSIS — M00261 Other streptococcal arthritis, right knee: Secondary | ICD-10-CM | POA: Diagnosis not present

## 2022-03-30 DIAGNOSIS — F32A Depression, unspecified: Secondary | ICD-10-CM

## 2022-03-30 DIAGNOSIS — G2581 Restless legs syndrome: Secondary | ICD-10-CM | POA: Diagnosis not present

## 2022-03-30 DIAGNOSIS — J989 Respiratory disorder, unspecified: Secondary | ICD-10-CM | POA: Diagnosis not present

## 2022-03-30 LAB — CBC
HCT: 34.7 % — ABNORMAL LOW (ref 36.0–46.0)
Hemoglobin: 11.4 g/dL — ABNORMAL LOW (ref 12.0–15.0)
MCHC: 33 g/dL (ref 30.0–36.0)
MCV: 88.3 fl (ref 78.0–100.0)
Platelets: 660 10*3/uL — ABNORMAL HIGH (ref 150.0–400.0)
RBC: 3.92 Mil/uL (ref 3.87–5.11)
RDW: 15.2 % (ref 11.5–15.5)
WBC: 8.2 10*3/uL (ref 4.0–10.5)

## 2022-03-30 LAB — BASIC METABOLIC PANEL
BUN: 11 mg/dL (ref 6–23)
CO2: 26 mEq/L (ref 19–32)
Calcium: 9.5 mg/dL (ref 8.4–10.5)
Chloride: 100 mEq/L (ref 96–112)
Creatinine, Ser: 0.55 mg/dL (ref 0.40–1.20)
GFR: 107.92 mL/min (ref >60.00)
Glucose, Bld: 85 mg/dL (ref 70–99)
Potassium: 3.8 mEq/L (ref 3.5–5.1)
Sodium: 136 mEq/L (ref 135–145)

## 2022-03-30 LAB — FERRITIN: Ferritin: 75 ng/mL (ref 10.0–291.0)

## 2022-03-30 MED ORDER — HYDROCODONE-ACETAMINOPHEN 5-325 MG PO TABS: 1.0000 | ORAL_TABLET | Freq: Three times a day (TID) | ORAL | 0 refills | Status: DC | PRN

## 2022-03-30 NOTE — Assessment & Plan Note (Signed)
Chronic issue.  Generally stable.  She will continue buspirone 7.5 mg 3 times daily and Lexapro 20 mg daily.  She will continue to see her therapist.

## 2022-03-30 NOTE — Assessment & Plan Note (Addendum)
Status post surgical intervention for this.  She has completed antibiotics.  Pain is suboptimally controlled with over-the-counter medications.  I will prescribe her a small amount of Norco 5-325 mg to take every 8 hours as needed.  She will monitor for drowsiness and discontinue use if she is excessively drowsy.  We are going to refer her back to orthopedics for continued follow-up given her continued pain.  Advised to seek medical attention if she develops erythema, increasing pain, increasing swelling, or warmth to the knee.  Patient was also advised to follow-up with physical therapy for this.

## 2022-03-30 NOTE — Patient Instructions (Signed)
Nice to see you. Please call [facility] later this week to set up follow-up. If you get excessively drowsy with the pain medicine please discontinue use of it. If your leg becomes more swollen, becomes more painful, you develop redness or warmth to your right knee you will need to seek medical attention in the emergency department.

## 2022-03-30 NOTE — Assessment & Plan Note (Signed)
This has resolved at this time.  Advised in the future if she develops a fever as high as she had during this illness she needs to seek medical attention in the emergency department.

## 2022-03-30 NOTE — Assessment & Plan Note (Signed)
Chronic issue.  We will check a ferritin today to see if this needs to be corrected.

## 2022-03-30 NOTE — Progress Notes (Signed)
[name], MD Phone: [phone]  [name] is a 47 y.o. female who presents today for f/u.  Septic arthritis: Patient presented to orthopedic urgent care with pain and swelling in her right knee and had aspiration that revealed purulent fluid.  She was sent to the emergency department and repeat aspiration revealed 170,000+ WBCs.  White blood cell count was 13.7.  She was afebrile.  She was started on IV antibiotics.  She recently had a lower extremity Doppler that did not reveal a DVT.  She underwent MRI of right lower extremity that revealed extensive subcutaneous edema most compatible with cellulitis that was negative for osteomyelitis.  She appeared to have a large dissecting Baker's cyst.  Orthopedics was consulted and she underwent arthroscopic I&D and open Baker's cyst decompression.  Synovial fluid grew out rare group C strep.  She was discharged on cefpodoxime 400 mg twice daily and she has completed the antibiotics.  Patient notes since discharge she has followed up with orthopedics for suture removal and noted they did not advise her further follow-up was needed.  She continues to have a swollen right leg and does have some dull pain in the leg and other pain when she walks.  She notes the swelling is similar to prior to going to the hospital.  She did have an ultrasound to evaluate this prior to going to the emergency room.  She was using a walker though is now using the cane.  She has not gotten set up with PT yet.  She feels overall well systemically.  Anxiety/depression: Patient states things have been a little worse with has been going on recently.  She continues on Lexapro and BuSpar and those have been helpful.  She continues to see her therapist.  She denies SI.  Patient reports she had a respiratory illness in December and had a fever of 105.7 F 1 night though it came down to 103.3 F.  She has recovered from that.  Restless leg syndrome: Patient notes a history  of this in the past.  Feels like this has worsened recently with an increase need to move her legs at night.  Social History   Tobacco Use  Smoking Status Every Day   Types: Cigarettes  Smokeless Tobacco Never    Current Outpatient Medications on File Prior to Visit  Medication Sig Dispense Refill   albuterol (VENTOLIN HFA) 108 (90 Base) MCG/ACT inhaler INHALE 2 PUFFS EVERY 6 HOURS AS NEEDED FOR WHEEZE OR SHORTNESS OF BREATH 18 each 1   aspirin 325 MG tablet Take 1 tablet (325 mg total) by mouth daily. 30 tablet 0   busPIRone (BUSPAR) 7.5 MG tablet Take 1 tablet (7.5 mg total) by mouth 3 (three) times daily. 270 tablet 0   escitalopram (LEXAPRO) 20 MG tablet Take 1 tablet (20 mg total) by mouth daily. 90 tablet 0   fluticasone (FLONASE) 50 MCG/ACT nasal spray Place 2 sprays into both nostrils daily. 16 g 0   gentamicin cream (GARAMYCIN) 0.1 % Apply 1 Application topically 2 (two) times daily. 30 g 1   naproxen (NAPROSYN) 500 MG tablet Take 1 tablet (500 mg total) by mouth 2 (two) times daily with a meal. 60 tablet 2   polyethylene glycol powder (GLYCOLAX/MIRALAX) 17 GM/SCOOP powder Take 17 g by mouth daily as needed for mild constipation. 500 g 0   No current facility-administered medications on file prior to visit.     ROS see history of present illness  Objective  Physical  Exam Vitals:   03/30/22 1117  BP: 122/74  Pulse: 89  Temp: 99.5 F (37.5 C)  SpO2: 97%    BP Readings from Last 3 Encounters:  03/30/22 122/74  03/15/22 106/61  12/18/21 118/76   Wt Readings from Last 3 Encounters:  03/30/22 157 lb (71.2 kg)  03/12/22 154 lb 1.6 oz (69.9 kg)  03/04/22 154 lb (69.9 kg)    Physical Exam Constitutional:      General: She is not in acute distress.    Appearance: She is not diaphoretic.  Cardiovascular:     Rate and Rhythm: Normal rate and regular rhythm.     Heart sounds: Normal heart sounds.  Pulmonary:     Effort: Pulmonary effort is normal.     Breath  sounds: Normal breath sounds.  Musculoskeletal:     Comments: Right lower extremity is swollen compared to the left, the right knee is nontender, there is no warmth or erythema to the right knee, surgical site inferior to the knee does not appear to have any surrounding erythema  Skin:    General: Skin is warm and dry.  Neurological:     Mental Status: She is alert.      Assessment/Plan: Please see individual problem list.  Streptococcal arthritis of right knee [facility]) Assessment & Plan: Status post surgical intervention for this.  She has completed antibiotics.  Pain is suboptimally controlled with over-the-counter medications.  I will prescribe her a small amount of Norco 5-325 mg to take every 8 hours as needed.  She will monitor for drowsiness and discontinue use if she is excessively drowsy.  We are going to refer her back to orthopedics for continued follow-up given her continued pain.  Advised to seek medical attention if she develops erythema, increasing pain, increasing swelling, or warmth to the knee.  Patient was also advised to follow-up with physical therapy for this.  Orders: -     HYDROcodone-Acetaminophen; Take 1 tablet by mouth every 8 (eight) hours as needed for moderate pain.  Dispense: 15 tablet; Refill: 0 -     Basic metabolic panel -     CBC -     Ambulatory referral to Orthopedic Surgery  Anxiety and depression Assessment & Plan: Chronic issue.  Generally stable.  She will continue buspirone 7.5 mg 3 times daily and Lexapro 20 mg daily.  She will continue to see her therapist.   Respiratory illness Assessment & Plan: This has resolved at this time.  Advised in the future if she develops a fever as high as she had during this illness she needs to seek medical attention in the emergency department.   Restless leg syndrome Assessment & Plan: Chronic issue.  We will check a ferritin today to see if this needs to be corrected.  Orders: -      Ferritin     Return in about 6 weeks (around 05/11/2022).   [name], MD [place]

## 2022-03-31 ENCOUNTER — Telehealth: Payer: Self-pay

## 2022-03-31 NOTE — Telephone Encounter (Signed)
Left message to call the office back regarding her lab results

## 2022-03-31 NOTE — Telephone Encounter (Signed)
Pt returned [place] call. Transferred.

## 2022-03-31 NOTE — Telephone Encounter (Signed)
-----   Message from Leone Haven, MD sent at 03/31/2022 10:27 AM EDT ----- Please let the patient know that her anemia has improved slightly from when she was in the hospital.  The anemia was likely related to her surgery.  Her ferritin level is acceptable at 75.  I would like to see how her restless leg symptoms do as her leg swelling and pain improves.  If not improving as her other leg symptoms improve we could consider specific treatment for restless legs.

## 2022-03-31 NOTE — Telephone Encounter (Signed)
Went over lab results with Patient she states that is good.

## 2022-04-10 ENCOUNTER — Other Ambulatory Visit: Payer: Self-pay | Admitting: Family

## 2022-04-10 DIAGNOSIS — J208 Acute bronchitis due to other specified organisms: Secondary | ICD-10-CM

## 2022-04-12 ENCOUNTER — Ambulatory Visit (INDEPENDENT_AMBULATORY_CARE_PROVIDER_SITE_OTHER): Payer: Medicare HMO | Admitting: Psychology

## 2022-04-12 DIAGNOSIS — F331 Major depressive disorder, recurrent, moderate: Secondary | ICD-10-CM | POA: Diagnosis not present

## 2022-04-12 NOTE — Progress Notes (Signed)
[name] is a 47 y.o. female patient .  Diagnosis 296.30 (Major depressive affective disorder, recurrent episode, unspecified) [n/a]  Symptoms Depressed or irritable mood. (Status: maintained) -- No Description Entered  Diminished interest in or enjoyment of activities. (Status: maintained) -- No Description Entered  Feelings of hopelessness, worthlessness, or inappropriate guilt. (Status: maintained) -- No Description Entered  Lack of energy. (Status: maintained) -- No Description Entered  Unresolved grief issues. (Status: maintained) -- No Description Entered  Medication Status compliance  Safety none  If Suicidal or Homicidal State Action Taken: unspecified  Current Risk: low Medications Hydroxyzine (Dosage: unknown)  Remiron (Dosage: unknown)  Sertraline (Dosage: unknown)  Objectives Related Problem: Appropriately grieve the loss in order to normalize mood and to return to previously adaptive level of functioning. Description: Increasingly verbalize hopeful and positive statements regarding self, others, and the future. Target Date: 2022-07-15 Frequency: Daily Modality: individual Progress: 90%  Related Problem: Appropriately grieve the loss in order to normalize mood and to return to previously adaptive level of functioning. Description: Learn and implement conflict resolution skills to resolve interpersonal problems. Target Date: 2022-07-15 Frequency: Daily Modality: individual Progress: 85%  Related Problem: Appropriately grieve the loss in order to normalize mood and to return to previously adaptive level of functioning. Description: Learn and implement problem-solving and decision-making skills. Target Date: 2022-07-15 Frequency: Daily Modality:  individual Progress: 85%  Related Problem: Appropriately grieve the loss in order to normalize mood and to return to previously adaptive level of functioning. Description: Describe current and past experiences with depression including their impact on functioning and attempts to resolve it. Target Date: 2021-07-14 Frequency: Daily Modality: individual Progress: 100%-completed  Related Problem: Complete the process of letting go of the lost significant other. Description: Report decreased time spent each day focusing on the loss. Target Date: 2022-07-15 Frequency: Daily Modality: individual Progress: 85%  Related Problem: Complete the process of letting go of the lost significant other. Description: Decrease unrealistic thoughts, statements, and feelings of being responsible for the loss. Target Date: 2021-07-14 Frequency: Daily Modality: individual Progress: 100%-completed  Related Problem: Complete the process of letting go of the lost significant other. Description: Verbalize and resolve feelings of anger or guilt focused on self or deceased loved one that interfere with the grieving process. Target Date: 2021-07-14 Frequency: Daily Modality: individual Progress: 100%-completed  Related Problem: Complete the process of letting go of the lost significant other. Description: Begin verbalizing feelings associated with the loss. Target Date: 2022-07-15 Frequency: Daily Modality: individual Progress: 80%  Related Problem: Complete the process of letting go of the lost significant other. Description: Identify what stages of grief have been experienced in the continuum of the grieving process. Target Date: 2021-07-14 Frequency: Daily Modality: individual Progress: 100%-completed  Related Problem: Complete the process of letting go of the lost significant other. Description: Participate in a therapy that addresses issues beyond grief  that have arisen as a result of the  loss. Target Date: 2022-07-15 Frequency: Daily Modality: individual Progress: 85%  Related Problem: Complete the process of letting go of the lost significant other. Description: Tell in detail the story of the current loss that is triggering symptoms. Target Date: 2021-07-14 Frequency: Daily Modality: individual Progress: 100%-completed  Client Response full compliance  Service Location Location, [address] [place], [place] [address]  Service Code cpt [phone]  Validate/empathize  Related past to present  Behavioral activation plan  Facilitate problem solving  Identify automatic thoughts  Rationally challenge thoughts or beliefs/cognitive restructuring  Identify/label emotions  Emotion regulation skills  Psychiatrist  Self care activities  Self-monitoring  Session Notes: Patient requests a video Webex session. She is at home and I am at my home office.  Dx: Major Depression  Meds: Lexapro (20mg ), Busperone (7.5)  Goals: States that she is seeking counseling to learn to manage the grief reaction to losing her husband. She reports feeling tired all the time and that she has no energy or motivation. Needs to develop coping strategies. Therapy to focus on reducing despair, sadness, hopelessness and helplessness. 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 Will explore those early traumas/losses and current triggers. Goal dates: 04-2021 Revised Goals:6-24 Patient states that recovery has been slow and she has had a few complications. She has trouble complying completely with all of the recovery recommendations. This is because she is (mostly) alone and has to do things for herself. Her parents help to a limited degree. She is trying "not to push it".  She is frustrated with her neighborhood and says someone is dealing drugs in her neighborhood. She feels safe, but is careful. She reflected on the life she has since Jay's  death. She realizes that she is, in many ways, a better and stronger person. She is able to recognize that there were aspects of her relationship with Ulice Dash that were not healthy for her. This is a reflection of significant personal growth. She says her self esteem is far greater than it has ever been. Also says her relationships are better than ever before.                                                          [name], PhD Time: [id number] 45 minutes

## 2022-04-23 ENCOUNTER — Encounter: Payer: Self-pay | Admitting: Orthopaedic Surgery

## 2022-04-26 ENCOUNTER — Ambulatory Visit (INDEPENDENT_AMBULATORY_CARE_PROVIDER_SITE_OTHER): Payer: Medicare HMO | Admitting: Psychology

## 2022-04-26 DIAGNOSIS — F331 Major depressive disorder, recurrent, moderate: Secondary | ICD-10-CM

## 2022-04-26 NOTE — Progress Notes (Signed)
[name] is a 47 y.o. female patient .  Diagnosis 296.30 (Major depressive affective disorder, recurrent episode, unspecified) [n/a]  Symptoms Depressed or irritable mood. (Status: maintained) -- No Description Entered  Diminished interest in or enjoyment of activities. (Status: maintained) -- No Description Entered  Feelings of hopelessness, worthlessness, or inappropriate guilt. (Status: maintained) -- No Description Entered  Lack of energy. (Status: maintained) -- No Description Entered  Unresolved grief issues. (Status: maintained) -- No Description Entered  Medication Status compliance  Safety none  If Suicidal or Homicidal State Action Taken: unspecified  Current Risk: low Medications Hydroxyzine (Dosage: unknown)  Remiron (Dosage: unknown)  Sertraline (Dosage: unknown)  Objectives Related Problem: Appropriately grieve the loss in order to normalize mood and to return to previously adaptive level of functioning. Description: Increasingly verbalize hopeful and positive statements regarding self, others, and the future. Target Date: 2022-07-15 Frequency: Daily Modality: individual Progress: 90%  Related Problem: Appropriately grieve the loss in order to normalize mood and to return to previously adaptive level of functioning. Description: Learn and implement conflict resolution skills to resolve interpersonal problems. Target Date: 2022-07-15 Frequency: Daily Modality: individual Progress: 85%  Related Problem: Appropriately grieve the loss in order to normalize mood and to return to previously adaptive level of functioning. Description: Learn and implement problem-solving and decision-making skills. Target Date: 2022-07-15 Frequency: Daily Modality: individual Progress: 85%  Related Problem: Appropriately grieve the loss in order to normalize mood and to return to previously adaptive level of  functioning. Description: Describe current and past experiences with depression including their impact on functioning and attempts to resolve it. Target Date: 2021-07-14 Frequency: Daily Modality: individual Progress: 100%-completed  Related Problem: Complete the process of letting go of the lost significant other. Description: Report decreased time spent each day focusing on the loss. Target Date: 2022-07-15 Frequency: Daily Modality: individual Progress: 85%  Related Problem: Complete the process of letting go of the lost significant other. Description: Decrease unrealistic thoughts, statements, and feelings of being responsible for the loss. Target Date: 2021-07-14 Frequency: Daily Modality: individual Progress: 100%-completed  Related Problem: Complete the process of letting go of the lost significant other. Description: Verbalize and resolve feelings of anger or guilt focused on self or deceased loved one that interfere with the grieving process. Target Date: 2021-07-14 Frequency: Daily Modality: individual Progress: 100%-completed  Related Problem: Complete the process of letting go of the lost significant other. Description: Begin verbalizing feelings associated with the loss. Target Date: 2022-07-15 Frequency: Daily Modality: individual Progress: 80%  Related Problem: Complete the process of letting go of the lost significant other. Description: Identify what stages of grief have been experienced in the continuum of the grieving process. Target Date: 2021-07-14 Frequency: Daily Modality: individual Progress: 100%-completed  Related Problem: Complete the process of letting go of the lost significant other. Description: Participate in a therapy that addresses issues beyond grief that have arisen as a result of the loss. Target Date: 2022-07-15 Frequency: Daily Modality: individual Progress: 85%  Related Problem: Complete the process of letting go of the lost  significant other. Description: Tell in detail the story of the current loss that is triggering symptoms. Target Date: 2021-07-14 Frequency: Daily Modality: individual Progress: 100%-completed  Client Response full compliance  Service Location Location, [address] [place], [place] [address]  Service Code cpt [phone]  Validate/empathize  Related  past to present  Behavioral activation plan  Facilitate problem solving  Identify automatic thoughts  Rationally challenge thoughts or beliefs/cognitive restructuring  Identify/label emotions  Emotion regulation skills  Psychiatrist  Self care activities  Self-monitoring  Session Notes: Patient requests a video Webex session. She is at home and I am at my home office.  Dx: Major Depression  Meds: Lexapro (20mg ), Busperone (7.5)  Goals: States that she is seeking counseling to learn to manage the grief reaction to losing her husband. She reports feeling tired all the time and that she has no energy or motivation. Needs to develop coping strategies. Therapy to focus on reducing despair, sadness, hopelessness and helplessness. 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 Will explore those early traumas/losses and current triggers. Goal dates: 04-2021 Revised Goals:6-24 Patient states she continues to hit obstacles in all her goals. Her phone is having problems and she is supposed to start school today. She needs the phone to connect to class. Tearful as she states she is "so tired of negative things happening". We talked about staying positive and not compromising her goals based on external misfortune. She feels defeated at these times when she is striving to make positive decisions for her future and circumstances interfere.Discussed strategies to block spiraling into negative cognitions. During session, she encountered more electronic problems and got disconnected. Attempts to  reconnect failed.                                                            Garrel Ridgel, PhD Time: 7:45a-8:30a 45 minutes

## 2022-05-10 ENCOUNTER — Ambulatory Visit (INDEPENDENT_AMBULATORY_CARE_PROVIDER_SITE_OTHER): Payer: Medicare HMO | Admitting: Psychology

## 2022-05-10 DIAGNOSIS — F331 Major depressive disorder, recurrent, moderate: Secondary | ICD-10-CM

## 2022-05-10 NOTE — Progress Notes (Signed)
[name] is a 47 y.o. female patient .  Diagnosis 296.30 (Major depressive affective disorder, recurrent episode, unspecified) [n/a]  Symptoms Depressed or irritable mood. (Status: maintained) -- No Description Entered  Diminished interest in or enjoyment of activities. (Status: maintained) -- No Description Entered  Feelings of hopelessness, worthlessness, or inappropriate guilt. (Status: maintained) -- No Description Entered  Lack of energy. (Status: maintained) -- No Description Entered  Unresolved grief issues. (Status: maintained) -- No Description Entered  Medication Status compliance  Safety none  If Suicidal or Homicidal State Action Taken: unspecified  Current Risk: low Medications Hydroxyzine (Dosage: unknown)  Remiron (Dosage: unknown)  Sertraline (Dosage: unknown)  Objectives Related Problem: Appropriately grieve the loss in order to normalize mood and to return to previously adaptive level of functioning. Description: Increasingly verbalize hopeful and positive statements regarding self, others, and the future. Target Date: 2022-07-15 Frequency: Daily Modality: individual Progress: 90%  Related Problem: Appropriately grieve the loss in order to normalize mood and to return to previously adaptive level of functioning. Description: Learn and implement conflict resolution skills to resolve interpersonal problems. Target Date: 2022-07-15 Frequency: Daily Modality: individual Progress: 85%  Related Problem: Appropriately grieve the loss in order to normalize mood and to return to previously adaptive level of functioning. Description: Learn and implement problem-solving and decision-making skills. Target Date: 2022-07-15 Frequency: Daily Modality: individual Progress: 85%  Related Problem: Appropriately grieve the loss in order to normalize mood and to return to previously adaptive level of functioning. Description: Describe current and past experiences  with depression including their impact on functioning and attempts to resolve it. Target Date: 2021-07-14 Frequency: Daily Modality: individual Progress: 100%-completed  Related Problem: Complete the process of letting go of the lost significant other. Description: Report decreased time spent each day focusing on the loss. Target Date: 2022-07-15 Frequency: Daily Modality: individual Progress: 85%  Related Problem: Complete the process of letting go of the lost significant other. Description: Decrease unrealistic thoughts, statements, and feelings of being responsible for the loss. Target Date: 2021-07-14 Frequency: Daily Modality: individual Progress: 100%-completed  Related Problem: Complete the process of letting go of the lost significant other. Description: Verbalize and resolve feelings of anger or guilt focused on self or deceased loved one that interfere with the grieving process. Target Date: 2021-07-14 Frequency: Daily Modality: individual Progress: 100%-completed  Related Problem: Complete the process of letting go of the lost significant other. Description: Begin verbalizing feelings associated with the loss. Target Date: 2022-07-15 Frequency: Daily Modality: individual Progress: 80%  Related Problem: Complete the process of letting go of the lost significant other. Description: Identify what stages of grief have been experienced in the continuum of the grieving process. Target Date: 2021-07-14 Frequency: Daily Modality: individual Progress: 100%-completed  Related Problem: Complete the process of letting go of the lost significant other. Description: Participate in a therapy that addresses issues beyond grief that have arisen as a result of the loss. Target Date: 2022-07-15 Frequency: Daily Modality: individual Progress: 85%  Related Problem: Complete the process of letting go of the lost significant other. Description: Tell in detail the story of the current  loss that is triggering symptoms. Target Date: 2021-07-14 Frequency: Daily Modality: individual Progress: 100%-completed  Client Response full compliance  Service Location Location, [address] [place], [place] [address]  Service Code cpt [phone]  Validate/empathize  Related past to present  Behavioral activation plan  Facilitate problem solving  Identify automatic thoughts  Rationally challenge thoughts or beliefs/cognitive restructuring  Identify/label emotions  Emotion regulation skills  Psychiatrist  Self care activities  Self-monitoring  Session Notes: Patient requests a video Webex session. She is at home and I am at my home office.  Dx: Major Depression  Meds: Lexapro ( ), Busperone (7.5)  Goals: States that she is seeking counseling to learn to manage the grief reaction to losing her husband. She reports feeling tired all the time and that she has no energy or motivation. Needs to develop coping strategies. Therapy to focus on reducing despair, sadness, hopelessness and helplessness. 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 Will explore those early traumas/losses and current triggers. Goal dates: 04-2021 Revised Goals:6-24 Patient states she had a very difficult conversation with her parents. Her father told her that it is his expectation that when he passes, she will move in to take care of her mother. She is upset that this is "expected" of her and that her brother is so detached. She says she is "supposed to be submissive" to her parents. This is a pattern for her in that she cannot set limits and will accommodate to others at her won expense. Her sense of self is fragile and she fears rejection. We talked about the origins of her fragile ego and desire to be accepted.                                                                  Garrel Ridgel, PhD Time: 7:45a-8:30a 45 minutes

## 2022-05-13 ENCOUNTER — Other Ambulatory Visit
Admission: RE | Admit: 2022-05-13 | Discharge: 2022-05-13 | Disposition: A | Discharge status: Home / Self Care | Payer: Medicare HMO | Source: Ambulatory Visit | Attending: Physician Assistant | Admitting: Physician Assistant

## 2022-05-13 ENCOUNTER — Encounter: Payer: Medicare HMO | Attending: Physician Assistant | Admitting: Physician Assistant

## 2022-05-13 DIAGNOSIS — G600 Hereditary motor and sensory neuropathy: Secondary | ICD-10-CM | POA: Diagnosis not present

## 2022-05-13 DIAGNOSIS — B999 Unspecified infectious disease: Secondary | ICD-10-CM | POA: Insufficient documentation

## 2022-05-13 DIAGNOSIS — G7100 Muscular dystrophy, unspecified: Secondary | ICD-10-CM | POA: Insufficient documentation

## 2022-05-13 DIAGNOSIS — L97512 Non-pressure chronic ulcer of other part of right foot with fat layer exposed: Secondary | ICD-10-CM | POA: Insufficient documentation

## 2022-05-13 DIAGNOSIS — M86671 Other chronic osteomyelitis, right ankle and foot: Secondary | ICD-10-CM | POA: Diagnosis not present

## 2022-05-13 DIAGNOSIS — L0889 Other specified local infections of the skin and subcutaneous tissue: Secondary | ICD-10-CM | POA: Diagnosis not present

## 2022-05-13 DIAGNOSIS — L84 Corns and callosities: Secondary | ICD-10-CM | POA: Insufficient documentation

## 2022-05-13 DIAGNOSIS — F17210 Nicotine dependence, cigarettes, uncomplicated: Secondary | ICD-10-CM | POA: Diagnosis not present

## 2022-05-13 DIAGNOSIS — G8929 Other chronic pain: Secondary | ICD-10-CM | POA: Insufficient documentation

## 2022-05-14 NOTE — Progress Notes (Signed)
[name], [name] ([id number]) [id number] Page 1 of 4 Visit Report for 05/13/2022 Abuse Risk Screen Details Patient Name: Date of Service: [name]. 05/13/2022 8:30 A M Medical Record Number: [id number] Patient Account Number: [id number] Date of Birth/Sex: Treating RN: 1975-05-06 (47 y.o. Skip Mayer Primary Care Coburn Knaus: Marikay Alar Other Clinician: Referring Graciemae Delisle: Treating Oleva Koo/Extender: Allen Derry Self, Referral Weeks in Treatment: 0 Abuse Risk Screen Items Answer ABUSE RISK SCREEN: Has anyone close to you tried to hurt or harm you recentlyo No Do you feel uncomfortable with anyone in your familyo No Has anyone forced you do things that you didnt want to doo No Electronic Signature(s) Signed: 05/13/2022 3:23:16 PM By: Elliot Gurney, BSN, RN, CWS, Kim RN, BSN Entered By: Elliot Gurney, BSN, RN, CWS, Kim on 05/13/2022 09:00:15 -------------------------------------------------------------------------------- Activities of Daily Living Details Patient Name: Date of Service: [name], Kelly Mad. 05/13/2022 8:30 A M Medical Record Number: [id number] Patient Account Number: [id number] Date of Birth/Sex: Treating RN: Sep 08, 1975 (47 y.o. Skip Mayer Primary Care Albina Gosney: Marikay Alar Other Clinician: Referring Zaraya Delauder: Treating Lache Dagher/Extender: Allen Derry Self, Referral Weeks in Treatment: 0 Activities of Daily Living Items Answer Activities of Daily Living (Please select one for each item) Drive Automobile Completely Able T Medications ake Completely Able Use T elephone Completely Able Care for Appearance Completely Able Use T oilet Completely Able Bath / Shower Completely Able Dress Self Completely Able Feed Self Completely Able Walk Completely Able Get In / Out Bed Completely Able Housework Completely Able Prepare Meals Completely Able Handle Money Completely Able Shop for Self Completely Able Electronic  Signature(s) Signed: 05/13/2022 3:23:16 PM By: Elliot Gurney, BSN, RN, CWS, Kim RN, BSN Entered By: Elliot Gurney, BSN, RN, CWS, Kim on 05/13/2022 09:00:28 -------------------------------------------------------------------------------- Education Screening Details Patient Name: Date of Service: [name], Kelly Mad. 05/13/2022 8:30 A M Medical Record Number: [id number] Patient Account Number: [id number] Date of Birth/Sex: Treating RN: [birth year] (47 y.o. Skip Mayer Primary Care Lataya Varnell: Marikay Alar Other Clinician: Referring Joseph Bias: Treating Earla Charlie/Extender: Allen Derry Self, Referral Weeks in Treatment: 0 Muntean, Gulianna Judie Petit ([id number]) [id number] Page 2 of 4 Primary Learner Assessed: Patient Learning Preferences/Education Level/Primary Language Learning Preference: Explanation, Demonstration Highest Education Level: College or Above Preferred Language: English Cognitive Barrier Language Barrier: No Translator Needed: No Memory Deficit: No Emotional Barrier: No Physical Barrier Impaired Vision: No Impaired Hearing: No Decreased Hand dexterity: No Knowledge/Comprehension Knowledge Level: High Comprehension Level: High Ability to understand written instructions: High Ability to understand verbal instructions: High Motivation Anxiety Level: Anxious Cooperation: Cooperative Education Importance: Acknowledges Need Interest in Health Problems: Asks Questions Perception: Coherent Willingness to Engage in Self-Management High Activities: Readiness to Engage in Self-Management High Activities: Psychologist, prison and probation services) Signed: 05/13/2022 3:23:16 PM By: Elliot Gurney, BSN, RN, CWS, Kim RN, BSN Entered By: Elliot Gurney, BSN, RN, CWS, Kim on 05/13/2022 09:00:59 -------------------------------------------------------------------------------- Fall Risk Assessment Details Patient Name: Date of Service: [name], Kelly Mad. 05/13/2022 8:30 A M Medical Record Number:  [id number] Patient Account Number: [id number] Date of Birth/Sex: Treating RN: 1975-07-16 (47 y.o. Cathlean Cower, Kim Primary Care Lindie Roberson: Marikay Alar Other Clinician: Referring Shivank Pinedo: Treating Delwyn Scoggin/Extender: Allen Derry Self, Referral Weeks in Treatment: 0 Fall Risk Assessment Items Have you had 2 or more falls in the last 12 monthso 0 No Have you had any fall that resulted in injury in the last 12 monthso 0 No FALLS RISK SCREEN History of falling - immediate or within 3 months 0 No Secondary diagnosis (Do you have 2  or more medical diagnoseso) 0 No Ambulatory aid None/bed rest/wheelchair/nurse 0 Yes Crutches/cane/walker 0 No Furniture 0 No Intravenous therapy Access/Saline/Heparin Lock 0 No Gait/Transferring Normal/ bed rest/ wheelchair 0 Yes Weak (short steps with or without shuffle, stooped but able to lift head while walking, may seek 0 No support from furniture) Impaired (short steps with shuffle, may have difficulty arising from chair, head down, impaired 0 No balance) Mental Status Oriented to own ability 0 Yes Overestimates or forgets limitations 0 No Risk Level: Low Risk Score: 0 Unangst, Charmel M ([id number]) [id number] Page 3 of 4 Electronic Signature(s) -------------------------------------------------------------------------------- Foot Assessment Details Patient Name: Date of Service: [name]. 05/13/2022 8:30 A M Medical Record Number: [id number] Patient Account Number: [id number] Date of Birth/Sex: Treating RN: 18-Mar-1975 (47 y.o. Skip Mayer Primary Care Cathline Dowen: Marikay Alar Other Clinician: Referring Jena Tegeler: Treating Terez Montee/Extender: Allen Derry Self, Referral Weeks in Treatment: 0 Foot Assessment Items Site Locations + = Sensation present, - = Sensation absent, C = Callus, U = Ulcer R = Redness, W = Warmth, M = Maceration, PU = Pre-ulcerative lesion F = Fissure, S = Swelling, D =  Dryness Assessment Right: Left: Other Deformity: No No Prior Foot Ulcer: No No Prior Amputation: No No Charcot Joint: No No Ambulatory Status: Ambulatory Without Help Gait: Steady Electronic Signature(s) Signed: 05/13/2022 3:23:16 PM By: Elliot Gurney, BSN, RN, CWS, Kim RN, BSN Entered By: Elliot Gurney, BSN, RN, CWS, Kim on 05/13/2022 09:01:54 -------------------------------------------------------------------------------- Nutrition Risk Screening Details Patient Name: Date of Service: [name], Kelly Mad. 05/13/2022 8:30 A M Medical Record Number: [id number] Patient Account Number: [id number] Date of Birth/Sex: Treating RN: [birth year] (47 y.o. Skip Mayer Primary Care Kosha Jaquith: Marikay Alar Other Clinician: Referring Karia Ehresman: Treating Bri Wakeman/Extender: Allen Derry Self, Referral Weeks in Treatment: 0 Height (in): 67 Weight (lbs): 153 Body Mass Index (BMI): 24 Carns, Cearra M ([id number]) [phone] [id number] Page 4 of 4 Nutrition Risk Screening Items Score Screening NUTRITION RISK SCREEN: I have an illness or condition that made me change the kind and/or amount of food I eat 0 No I eat fewer than two meals per day 0 No I eat few fruits and vegetables, or milk products 2 Yes I have three or more drinks of beer, liquor or wine almost every day 0 No I have tooth or mouth problems that make it hard for me to eat 0 No I don't always have enough money to buy the food I need 0 No I eat alone most of the time 0 No I take three or more different prescribed or over-the-counter drugs a day 1 Yes Without wanting to, I have lost or gained 10 pounds in the last six months 0 No I am not always physically able to shop, cook and/or feed myself 0 No Nutrition Protocols Good Risk Protocol Moderate Risk Protocol 0 Provide education on nutrition High Risk Proctocol Risk Level: Moderate Risk Score: 3 Electronic Signature(s) Signed: 05/13/2022 3:23:16 PM By: Elliot Gurney, BSN, RN,  CWS, Kim RN, BSN Entered By: Elliot Gurney, BSN, RN, CWS, Kim on 05/13/2022 09:01:34

## 2022-05-14 NOTE — Progress Notes (Signed)
MIALANI, REICKS ([id number]) [id number] Page 1 of 12 Visit Report for 05/13/2022 Chief Complaint Document Details Patient Name: Date of Service: [name]. 05/13/2022 8:30 A M Medical Record Number: [id number] Patient Account Number: [id number] Date of Birth/Sex: Treating RN: [birth year] (48 y.o. Female) Huel Coventry Primary Care Provider: Marikay Alar Other Clinician: Referring Provider: Treating Provider/Extender: Allen [name] Self, Referral Weeks in Treatment: 0 Information Obtained from: Patient Chief Complaint Right foot ulcer Electronic Signature(s) Signed: 05/13/2022 9:10:09 AM By: Allen Derry [name] Entered By: Allen [name] on 05/13/2022 09:10:09 -------------------------------------------------------------------------------- Debridement Details Patient Name: Date of Service: [name], Kelly Mad. 05/13/2022 8:30 A M Medical Record Number: [id number] Patient Account Number: [id number] Date of Birth/Sex: Treating RN: [birth year] (47 y.o. Female) Huel Coventry Primary Care Provider: Marikay Alar Other Clinician: Referring Provider: Treating Provider/Extender: Allen [name] Self, Referral Weeks in Treatment: 0 Debridement Performed for Assessment: Wound #5 Right T Third oe Performed By: Physician Allen Derry, [name] Debridement Type: Debridement Level of Consciousness (Pre-procedure): Awake and Alert Pre-procedure Verification/Time Out Yes - 09:45 Taken: Percent of Wound Bed Debrided: 100% T Area Debrided (cm): otal 2 Tissue and other material debrided: Viable, Non-Viable, Callus, Skin: Dermis , Skin: Epidermis, Other: nail Level: Skin/Epidermis Debridement Description: Selective/Open Wound Instrument: Curette, Scissors Specimen: Swab, Number of Specimens T aken: 1 Bleeding: Moderate Hemostasis Achieved: Pressure Response to Treatment: Procedure was tolerated well Level of Consciousness (Post- Awake and Alert procedure): Post  Debridement Measurements of Total Wound Length: (cm) 1.5 Width: (cm) 1.7 Depth: (cm) 0.6 Volume: (cm) 1.202 Character of Wound/Ulcer Post Debridement: Stable Post Procedure Diagnosis Same as Pre-procedure Electronic Signature(s) Signed: 05/13/2022 3:23:16 PM By: Elliot Gurney, BSN, RN, CWS, Kim RN, BSN Signed: 05/13/2022 5:47:58 PM By: Allen Derry [name] Entered By: Elliot Gurney BSN, RN, CWS, Kim on 05/13/2022 09:48:29 [name], [name] ([id number]) [id number] Page 2 of 12 -------------------------------------------------------------------------------- HPI Details Patient Name: Date of Service: [name]. 05/13/2022 8:30 A M Medical Record Number: [id number] Patient Account Number: [id number] Date of Birth/Sex: Treating RN: 11-19-1975 (47 y.o. Female) Huel Coventry Primary Care Provider: Marikay Alar Other Clinician: Referring Provider: Treating Provider/Extender: Allen [name] Self, Referral Weeks in Treatment: 0 History of Present Illness HPI Description: 46 year old patient sent to [name] by her PCP Dr. Bing Ree who saw her recently on 05/15/2015 for chronic pain related to Charcot Hilda Lias tooth disease and subsequent problems with her feet with a history of bilateral great toe amputations for osteomyelitis. Her notes were reviewed and there was extensive history of opioid treatment and management by the [facility] pain clinic from where she has been terminated. X-ray of the right foot foot was done on 03/13/2015 and was suggestive of active infection and possible osteomyelitis. her past surgical history includes amputation of the left big toe in 2011 and amputation of the right big toe in 2015. She was sitting the wound clinic at [facility] and we will try and obtain these notes. She is a smoker and smokes about 15 cigarettes a day. 06/12/2015 - x-ray of the right foot -- IMPRESSION:1. Soft tissue swelling of the plantar surface of the foot. No underlying acute  abnormality. 2. Amputation deformity again of the right great toe. Old healed bony deformity noted the right second metatarsal. No acute bony abnormality identified. We have not received any reports from the [facility] hospital yet. She continues to smoke at least 10 cigarettes a day 06/19/2015 -- she has increasing pain in the right foot and also has had some low-grade  fever. Her vascular test is scheduled for tomorrow and her MRIs not till next week. Readmission: 08-20-2021 upon evaluation today patient presents for initial inspection here in our clinic although she has been seen that was back in 2017. At that time she was having a issue with her right foot. With that being said upon inspection today she is actually having issues with the right foot as well on the medial aspect where she has some breakdown due to what I am assuming is pressure to the foot location. 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 I did review her culture report which showed evidence of bacteria including hemolytic Streptococcus group A as well as Staphylococcus aureus. The patient is currently on doxycycline and tells me that this is getting much better. T opical gentamicin is also being utilized. Also did review her arterial studies which show that she has on the right and ABI of 1.15 which is triphasic and appears to be excellent there is no significant peripheral artery disease noted the left is also. With ABI of 1.11 and a good report in that regard as well. Lastly I did also review her MRI of the right forefoot which was performed on 07-06-2021. This shows that she does have mild subcortical marrow edema in the stump of the first metatarsal concerning for osteomyelitis. There is also a malunited fracture of the second metatarsal. The patient has bone  marrow edema in the base of the second third fourth and fifth metatarsals concerning for stress reaction obviously this infection is going to be an issue for [name] here as well she needs to be treated appropriately and I do believe that the doxycycline is a good option right now although I may need to #1 extend this and #2 plan for even altering or adding to the treatment regimen depending on how things progress. Patient voiced understanding.Marland Kitchen 09-01-2021 upon evaluation today patient appears to be doing excellent in regard to her wound. She is actually showing signs of excellent improvement which is great news. Fortunately I do not see any signs of active infection locally or systemically at this point which is great news and overall I am extremely pleased with where things stand today. I do not see any evidence of active infection locally or systemically. 09-07-2021 upon evaluation today patient appears to be doing well with regard to her foot ulcer. This is actually showing signs of improvement which is great news. Fortunately there does not appear to be any signs of active infection locally or systemically at this time which is as well. 09-22-2021 upon evaluation today patient appears to be doing well currently in regard to her wound. This is measuring smaller and looking better were slowly making progress here. Fortunately there does not appear to be any evidence of active infection locally or systemically at this time. Upon inspection patient's wound bed showed evidence of good granulation and epithelization at this point. 10-05-2021 upon evaluation today patient's wound shows a lot of callus buildup but the surface of the wound is actually doing quite well. She is going require some sharp debridement to clearway some of the necrotic debris. 10-19-2021 upon evaluation today patient appears to be doing better in regard to her wound which is actually measuring little bit smaller today. Fortunately  there does not appear to be any signs of  significant infection she does have quite a bit of callus we will get have to work on that today. Otherwise I think we are headed in the appropriate direction. 11-09-2021 upon evaluation today patient appears to be doing well in regard to her wound from the standpoint of infection and actually appears to be doing better she actually just completed the doxycycline. With that being said I am hopeful that this took care of the osteomyelitis although that something that we will definitely keep a close eye on. With that being said I do believe that she is still having tremendous amount of callus buildup which I believe in turn is a result of her having issues here with Friction and callus buildup. With that being said I do believe that the patient would benefit here from a total contact cast I discussed that with her today. I do want to get more aggressive to get this closed as I think that if we do not then she is more likely to have a long standing issue here going forward. 11-17-2021 upon evaluation today patient appears to be doing well although her wound is still open. I do think that she is ready to be placed in a total contact cast. She actually has come back on Thursday to have this changed out. Fortunately and overall I do not see any signs of worsening and I think that we should be able to get this on without any complication or problem.. 11-19-2021 upon evaluation today patient appears to be doing well currently in regard to her wound. She has been tolerating the dressing changes without complication and overall I am extremely pleased with where we stand today. I do not see any evidence of active infection locally or systemically at this time which is great news. I think the total contact cast did an awesome job for her. 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 Only a very small area  remains on the plantar foot. I had to use illumination to even see anything that looked open here. Much improved 12-03-2021 upon evaluation today patient appears to be doing well currently in regard to her wound. Of note she tells me that she did end up cutting off her cast with a pair of kitchen shears 2 nights ago. She states that she has been walking on it more than she should have after the cast was put on due to a friend who ended up in the hospital that it caught her right after she had a put on that same day. She feels like she probably deformed the cast that it was causing some pressure points that she was afraid to get a blister and did not want another wound. For that reason she did go ahead and cut the soft and does not appear to have caused any damage. She tells me that it only took 7 minutes. With that being said I do think the wound actually looks really good she has a lot of callus buildup I am going to remove that just to make sure nothing side underneath but other than that overall she seems to be doing quite well. 12-07-2021 upon evaluation today patient's wound is actually showing signs of excellent improvement and very pleased with where we stand I think we are headed in the right direction. Fortunately there does not appear to be any signs of active infection at this time which is great news. No fevers, chills, nausea, Archuleta, Weslee M (  [id number]) [id number] Page 3 of 12 vomiting, or diarrhea. 01-14-2022 upon evaluation today patient appears to be doing well currently in regard to her wound she does have a bit of callus buildup unfortunately but otherwise does not seem to show any signs of active infection locally or systemically at this time. Fortunately I do not see any evidence of infection locally or systemically which is great news. She does have a lot of callus buildup she has been very sick so has not been in to be seen recently. With that being  said I do not think her wound is a whole lot worse than what it was before but it is also a lot more callused over so it is difficult to tell initially. 01-19-2022 upon evaluation today patient appears to be doing poorly unfortunately in regard to her wound. She has been tolerating the dressing changes without complication prior but unfortunately in the past week she has developed issues with redness and erythema on her foot with a new wound on the top of her foot which was not there last time I saw her. Unfortunately this seems to be getting significantly worse she is also had temperatures ranging up to 103 although it was 99.9 here in the office today it was not nearly that bad. Fortunately I do not see any signs obvious of sepsis but nonetheless I am concerned about the significance of this infection I think she does need to probably get into the ER ASAP in order to be evaluated and treated appropriately based on what we are seeing. The patient is very worried about the situation here. With that being said she tells me that she has been wearing even prior to coming in today. 01-26-2022 upon evaluation today patient actually appears to be doing significantly better despite the fact she did not go to the ER last week she tells me she had a lot going on with family and otherwise just was not able to make it. Fortunately there does not appear to be any signs of infection locally or systemically at this time. 02-16-2022 upon evaluation today patient appears to be doing well the wound on the top of her foot is looking smaller the wound on the bottom/medial portion of her foot is going require some debridement today to clearway some of the necrotic debris but overall looks to be doing quite well. I do not see any evidence of infection locally nor systemically at this time which is great news. 03-02-2022 upon evaluation today patient appears to be doing poorly in regard to her foot. It actually appears that she  is probably infected based on what I am seeing and I think that she is going require some intervention in this regard. I think that we will probably put her back on doxycycline and also believe that she really needs an MRI repeated it has not been done since June antibiotics even then she showed some signs of osteomyelitis I feel like this may be getting worse. She still has not gone to the hospital as previously recommended although the infection did seem to clear with the doxycycline which is why I am going to go ahead and redo that she also states that she is not to be able to go to the hospital today. 03-09-2022 upon evaluation today patient appears to be doing well with regard to her foot unfortunately she is not doing nearly as well in regard to her leg which is showing signs of swelling. She tells me she  has been having issues with her knee she wants to see orthopedics she has not gone anywhere as of yet I do think she should go to [facility] for further evaluation and treatment. She voiced understanding. With that being said again her foot is doing well mainly because she has been off of her leg due to the pain in her knee and I think this has been one of the primary issues here at this point. 3/5; the patient's wound today has healed but as noted she has not seen orthopedics with regard to swelling. After her recent knee surgery. Readmission: 05-13-2022 patient presents today for reevaluation here in the clinic concerning issues she has been having with her toe on the right foot. This is something that we were able to get her in for reevaluation with fairly quickly which was great news. Fortunately I do not see any signs of active infection locally nor systemically which is excellent at this point. Nonetheless she had an abscess in her right knee that was February 24. Fortunately that seems to been doing better although she has not been taking the antibiotics exactly how she should have. I do  believe that with this toe it could be in someway related as far as infection is concerned but this is kind of a different situation as well. Although I think it is infected we will get a wound culture today once I get this debrided away right now it is mainly a blister and that we need to open before would not be able to determine what the next best course of treatment is. She does have doxycycline left that I previously prescribed her. Electronic Signature(s) Signed: 05/13/2022 5:34:06 PM By: Allen Derry [name] Previous Signature: 05/13/2022 5:33:42 PM Version By: Allen Derry [name] Entered By: Allen [name] on 05/13/2022 17:34:06 -------------------------------------------------------------------------------- Physical Exam Details Patient Name: Date of Service: [name], Kelly Mad. 05/13/2022 8:30 A M Medical Record Number: [id number] Patient Account Number: [id number] Date of Birth/Sex: Treating RN: [birth year] (47 y.o. Female) Huel Coventry Primary Care Provider: Marikay Alar Other Clinician: Referring Provider: Treating Provider/Extender: Allen [name] Self, Referral Weeks in Treatment: 0 Constitutional sitting or standing blood pressure is within target range for patient.. pulse regular and within target range for patient.Marland Kitchen respirations regular, non-labored and within target range for patient.Marland Kitchen temperature within target range for patient.. Well-nourished and well-hydrated in no acute distress. Eyes conjunctiva clear no eyelid edema noted. pupils equal round and reactive to light and accommodation. Ears, Nose, Mouth, and Throat no gross abnormality of ear auricles or external auditory canals. normal hearing noted during conversation. mucus membranes moist. Respiratory normal breathing without difficulty. Cardiovascular 2+ dorsalis pedis/posterior tibialis pulses. no clubbing, cyanosis, significant edema, <3 sec cap refill. Psychiatric this patient is able to make decisions and demonstrates  good insight into disease process. Alert and Oriented x 3. pleasant and cooperative. Notes Kelly, [name] ([id number]) [id number] Page 4 of 12 Upon inspection patient's wound bed actually showed signs of poor granulation and blistering noted over the tip of the toe and this was her right third toe. She subsequently did require sharp debridement today to clearway some of the necrotic debris and overall she tolerated that debridement without complication this includes removing the nail. The nailbed was exposed there was no direct bone exposure I definitely think she needs to start doxycycline but she needs to monitor this closely if the redness and erythema spreads she should go to the ER ASAP. She voiced understanding. We did obtain  a wound culture from the deepest part of the wound once this was debrided away and we will see what the results of that show. She is in agreement with that plan. Electronic Signature(s) Signed: 05/13/2022 5:34:41 PM By: Allen Derry [name] Entered By: Allen [name] on 05/13/2022 17:34:40 -------------------------------------------------------------------------------- Physician Orders Details Patient Name: Date of Service: [name], Kelly Mad. 05/13/2022 8:30 A M Medical Record Number: [id number] Patient Account Number: [id number] Date of Birth/Sex: Treating RN: May 29, 1975 (47 y.o. 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 Nurse  Visit as needed Bathing/ Shower/ Hygiene May shower with wound dressing protected with water repellent cover or cast protector. Edema Control - Lymphedema / Segmental Compressive Device / Other Elevate, Exercise Daily and A void Standing for Long Periods of Time. Elevate legs to the level of the heart and pump ankles as often as possible Elevate leg(s) parallel to the floor when sitting. Additional Orders / Instructions Go to Emergency Department of your choice for evaluation and treatment. - Start your antibiotic once you get home. If there are changes in redness, swelling, fever, chills, etc. go to the nearest emergency department. Wound Treatment Wound #5 - T Third oe Wound Laterality: Right Cleanser: Vashe 5.8 (oz) Discharge Instructions: Use vashe 5.8 (oz) as directed Prim Dressing: Hydrofera Blue Ready Transfer Foam, 2.5x2.5 (in/in) ary Discharge Instructions: Apply Hydrofera Blue Ready to wound bed as directed Secondary Dressing: Conforming Guaze Roll-Medium Discharge Instructions: Apply Conforming Stretch Guaze Bandage as directed Secured With: Medipore T - 107M Medipore H Soft Cloth Surgical T ape ape, 2x2 (in/yd) Laboratory Bacteria identified in Wound by Culture (MICRO) LOINC Code: [phone] Convenience Name: Wound culture routine Patient Medications llergies: penicillin, Bactrim, Sulfa (Sulfonamide Antibiotics), tramadol HCl, clindamycin, bee venom protein (honey bee) A Notifications Medication Indication Start End right toe infection 05/13/2022 doxycycline hyclate [name], Kelly M ([id number]) [id number] Page 5 of 12 DOSE 1 - oral 100 mg tablet - 1 tablet oral twice a day for 14 days Electronic Signature(s) Signed: 05/18/2022 4:35:08 PM By: Midge Aver MSN RN CNS WTA Signed: 05/18/2022 6:18:39 PM By: Allen Derry [name] Previous Signature: 05/13/2022 3:23:16 PM Version By: Elliot Gurney BSN, RN, CWS, Kim RN, BSN Previous Signature: 05/13/2022 5:47:58 PM  Version By: Allen Derry [name] Entered By: Midge Aver on 05/18/2022 11:02:29 Prescription 05/13/2022 -------------------------------------------------------------------------------- [name], Kelly M. Allen Derry [name] Patient Name: Provider: 1975/11/14 [id number] Date of Birth: NPI#: Female [id number] Sex: DEA #: [phone] Phone #: License #: UPN: Patient Address: [address] [facility] [place], [place] [address] [facility] [address] [place], [place] [address] [phone] Allergies penicillin; Bactrim; Sulfa (Sulfonamide Antibiotics); tramadol HCl; clindamycin; bee venom protein (honey bee) Medication Medication: Route: Strength: Form: doxycycline hyclate 100 mg tablet oral 100 mg tablet Class: TETRACYCLINE ANTIBIOTICS Dose: Frequency / Time: Indication: 1 1 tablet oral twice a day for 14 days right toe infection Number of Refills: Number of Units: 0 Generic Substitution: Start Date: End Date: One Time Use: Substitution Permitted 05/13/2022 No Note to Pharmacy: Hand Signature: Date(s): Electronic Signature(s) Signed:  05/18/2022 4:35:08 PM By: Midge Aver MSN RN CNS WTA Signed: 05/18/2022 6:18:39 PM By: Allen Derry [name] Previous Signature: 05/13/2022 3:23:16 PM Version By: Elliot Gurney BSN, RN, CWS, Kim RN, BSN Previous Signature: 05/13/2022 5:47:58 PM Version By: Allen Derry [name] Entered By: Midge Aver on 05/18/2022 11:02:29 -------------------------------------------------------------------------------- Problem List Details Patient Name: Date of Service: [name], Kelly Mad. 05/13/2022 8:30 A M Medical Record Number: [id number] Patient Account Number: [id number] Date of Birth/Sex: Treating RN: [birth year] (47 y.o. 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 05/13/2022 8:30 A M Medical Record Number: [id number] Patient Account Number: [id number] Date of Birth/Sex: Treating RN: 11-Feb-1975 (47 y.o. Female) Huel Coventry Primary Care Provider: Marikay Alar Other Clinician: Referring Provider: Treating Provider/Extender: Allen [name] Self, Referral Weeks in Treatment: 0 Subjective Chief Complaint Information obtained from Patient Right foot ulcer History of Present Illness (HPI) 47 year old patient sent to [name] by her PCP Dr. Bing Ree who saw her recently on 05/15/2015 for chronic pain related to Charcot Hilda Lias tooth disease and subsequent problems with her feet with a history of bilateral great toe amputations for osteomyelitis. Her notes were reviewed and there was extensive history of opioid treatment and management by the [facility] pain clinic from where she has been terminated. X-ray of the right foot foot was done on 03/13/2015 and was suggestive of active infection and possible osteomyelitis. her past surgical history includes amputation of the left big toe in 2011 and amputation of the right big toe in 2015. She was sitting the  wound clinic at [facility] and we will try and obtain these notes. She is a smoker and smokes about 15 cigarettes a day. 06/12/2015 - x-ray of the right foot -- IMPRESSION:1. Soft tissue swelling of the plantar surface of the foot. No underlying acute abnormality. 2. Amputation deformity again of the right great toe. Old healed bony deformity noted the right second metatarsal. No acute bony abnormality identified. We have not received any reports from the [facility] yet. She continues to smoke at least 10 cigarettes a day 06/19/2015 -- she has increasing pain in the right foot and also has had some low-grade fever. Her vascular test is scheduled for tomorrow and her MRIs not till next week. Readmission: 08-20-2021 upon evaluation today patient presents for initial inspection here in our clinic although she has been seen that was back in 2017. At that time she was having a issue with her right foot. With that being said upon inspection today she is actually having issues with the right foot as well on the medial aspect where she has some breakdown due to what I am assuming is pressure to the foot location. 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 I did review her culture report which showed evidence of bacteria including hemolytic Streptococcus group A as well as Staphylococcus aureus. The patient is currently on doxycycline and tells me that this is getting much better. T opical gentamicin is also being utilized. Also did review her arterial studies which show that she has on the right and ABI of 1.15 which is triphasic and appears to be excellent there is no significant peripheral artery disease noted the left is also. With ABI of 1.11 and a good report in that regard as well. Lastly I did also review her MRI of the right  forefoot which was performed on 07-06-2021. This shows that she does have mild subcortical marrow edema in the stump of the first metatarsal concerning for osteomyelitis. There is also a malunited fracture of the second metatarsal. The patient has bone marrow edema in the base of the second third fourth and fifth metatarsals concerning for stress reaction obviously this infection is going to be an issue for [name] here as well she needs to be treated appropriately and I do believe that the doxycycline is a good option right now although I may need to #1 extend this and #2 plan for even altering or adding to the treatment regimen depending on how things progress. Patient voiced understanding.Marland Kitchen 09-01-2021 upon evaluation today patient appears to be doing excellent in regard to her wound. She is actually showing signs of excellent improvement which is great news. Fortunately I do not see any signs of active infection locally or systemically at this point which is great news and overall I am extremely pleased with where things stand today. I do not see any evidence of active infection locally or systemically. 09-07-2021 upon evaluation today patient appears to be doing well with regard to her foot ulcer. This is actually showing signs of improvement which is great JOLEA, DOLLE ([id number]) [id number] Page 7 of 12 news. Fortunately there does not appear to be any signs of active infection locally or systemically at this time which is as well. 09-22-2021 upon evaluation today patient appears to be doing well currently in regard to her wound. This is measuring smaller and looking better were slowly making progress here. Fortunately there does not appear to be any evidence of active infection locally or systemically at this time. Upon inspection patient's wound bed showed evidence of good granulation and epithelization at this point. 10-05-2021 upon evaluation today patient's wound  shows a lot of callus buildup but the surface of the wound is actually doing quite well. She is going require some sharp debridement to clearway some of the necrotic debris. 10-19-2021 upon evaluation today patient appears to be doing better in regard to her wound which is actually measuring little bit smaller today. Fortunately there does not appear to be any signs of significant infection she does have quite a bit of callus we will get have to work on that today. Otherwise I think we are headed in the appropriate direction. 11-09-2021 upon evaluation today patient appears to be doing well in regard to her wound from the standpoint of infection and actually appears to be doing better she actually just completed the doxycycline. With that being said I am hopeful that this took care of the osteomyelitis although that something that we will definitely keep a close eye on. With that being said I do believe that she is still having tremendous amount of  callus buildup which I believe in turn is a result of her having issues here with Friction and callus buildup. With that being said I do believe that the patient would benefit here from a total contact cast I discussed that with her today. I do want to get more aggressive to get this closed as I think that if we do not then she is more likely to have a long standing issue here going forward. 11-17-2021 upon evaluation today patient appears to be doing well although her wound is still open. I do think that she is ready to be placed in a total contact cast. She actually has come back on Thursday to have this changed out. Fortunately and overall I do not see any signs of worsening and I think that we should be able to get this on without any complication or problem.. 11-19-2021 upon evaluation today patient appears to be doing well currently in regard to her wound. She has been tolerating the dressing changes without complication and overall I am extremely  pleased with where we stand today. I do not see any evidence of active infection locally or systemically at this time which is great news. I think the total contact cast did an awesome job for her. 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 Only a very small area remains on the plantar foot. I had to use illumination to even see anything that looked open here. Much improved 12-03-2021 upon evaluation today patient appears to be doing well currently in regard to her wound. Of note she tells me that she did end up cutting off her cast with a pair of kitchen shears 2 nights ago. She states that she has been walking on it more than she should have after the cast was put on due to a friend who ended up in the hospital that it caught her right after she had a put on that same day. She feels like she probably deformed the cast that it was causing some pressure points that she was afraid to get a blister and did not want another wound. For that reason she did go ahead and cut the soft and does not appear to have caused any damage. She tells me that it only took 7 minutes. With that being said I do think the wound actually looks really good she has a lot of callus buildup I am going to remove that just to make sure nothing side underneath but other than that overall she seems to be doing quite well. 12-07-2021 upon evaluation today patient's wound is actually showing signs of excellent improvement and very pleased with where we stand I think we are headed in the right direction. Fortunately there does not appear to be any signs of active infection at this time which is great news. No fevers, chills, nausea, vomiting, or diarrhea. 01-14-2022 upon evaluation today patient appears to be doing well currently in regard to her wound she does have a bit of callus buildup unfortunately but otherwise does not seem to show any signs of active infection  locally or systemically at this time. Fortunately I do not see any evidence of infection locally or systemically which is great news. She does have a lot of callus buildup she has been very sick so has not been in to be seen recently. With that being said I do not think her wound is a whole lot worse than what it was before but  it is also a lot more callused over so it is difficult to tell initially. 01-19-2022 upon evaluation today patient appears to be doing poorly unfortunately in regard to her wound. She has been tolerating the dressing changes without complication prior but unfortunately in the past week she has developed issues with redness and erythema on her foot with a new wound on the top of her foot which was not there last time I saw her. Unfortunately this seems to be getting significantly worse she is also had temperatures ranging up to 103 although it was 99.9 here in the office today it was not nearly that bad. Fortunately I do not see any signs obvious of sepsis but nonetheless I am concerned about the significance of this infection I think she does need to probably get into the ER ASAP in order to be evaluated and treated appropriately based on what we are seeing. The patient is very worried about the situation here. With that being said she tells me that she has been wearing even prior to coming in today. 01-26-2022 upon evaluation today patient actually appears to be doing significantly better despite the fact she did not go to the ER last week she tells me she had a lot going on with family and otherwise just was not able to make it. Fortunately there does not appear to be any signs of infection locally or systemically at this time. 02-16-2022 upon evaluation today patient appears to be doing well the wound on the top of her foot is looking smaller the wound on the bottom/medial portion of her foot is going require some debridement today to clearway some of the necrotic debris but  overall looks to be doing quite well. I do not see any evidence of infection locally nor systemically at this time which is great news. 03-02-2022 upon evaluation today patient appears to be doing poorly in regard to her foot. It actually appears that she is probably infected based on what I am seeing and I think that she is going require some intervention in this regard. I think that we will probably put her back on doxycycline and also believe that she really needs an MRI repeated it has not been done since June antibiotics even then she showed some signs of osteomyelitis I feel like this may be getting worse. She still has not gone to the hospital as previously recommended although the infection did seem to clear with the doxycycline which is why I am going to go ahead and redo that she also states that she is not to be able to go to the hospital today. 03-09-2022 upon evaluation today patient appears to be doing well with regard to her foot unfortunately she is not doing nearly as well in regard to her leg which is showing signs of swelling. She tells me she has been having issues with her knee she wants to see orthopedics she has not gone anywhere as of yet I do think she should go to [facility] for further evaluation and treatment. She voiced understanding. With that being said again her foot is doing well mainly because she has been off of her leg due to the pain in her knee and I think this has been one of the primary issues here at this point. 3/5; the patient's wound today has healed but as noted she has not seen orthopedics with regard to swelling. After her recent knee surgery. Readmission: 05-13-2022 patient presents today for reevaluation here in the clinic  concerning issues she has been having with her toe on the right foot. This is something that we were able to get her in for reevaluation with fairly quickly which was great news. Fortunately I do not see any signs of active infection  locally nor systemically which is excellent at this point. Nonetheless she had an abscess in her right knee that was February 24. Fortunately that seems to been doing better although she has not been taking the antibiotics exactly how she should have. I do believe that with this toe it could be in someway related as far as infection is concerned but this is kind of a different situation as well. Although I think it is infected we will get a wound culture today once I get this debrided away right now it is mainly a blister and that we need to open before would not be able to determine what the next best course of treatment is. She does have doxycycline left that I previously prescribed her. Patient History Information obtained from Patient. Allergies penicillin (Reaction: hives), Bactrim (Reaction: breathing issues), Sulfa (Sulfonamide Antibiotics), tramadol HCl, clindamycin, bee venom protein (honey bee) [name], Kelly M ([id number]) [id number] Page 8 of 12 Family History Diabetes, Hypertension - Father, No family history of Cancer, Heart Disease, Kidney Disease, Lung Disease, Seizures, Stroke, Thyroid Problems, Tuberculosis. Social History Current every day smoker, Marital Status - Widowed, Alcohol Use - Rarely, Drug Use - No History, Caffeine Use - Daily. Medical History Eyes Denies history of Cataracts, Glaucoma, Optic Neuritis Ear/Nose/Mouth/Throat Denies history of Chronic sinus problems/congestion, Middle ear problems Hematologic/Lymphatic Patient has history of Anemia Denies history of Hemophilia, Human Immunodeficiency Virus, Lymphedema, Sickle Cell Disease Respiratory Patient has history of Asthma Denies history of Chronic Obstructive Pulmonary Disease (COPD), Pneumothorax, Sleep Apnea, Tuberculosis Cardiovascular Denies history of Arrhythmia, Congestive Heart Failure, Coronary Artery Disease, Deep Vein Thrombosis, Hypertension, Hypotension,  Myocardial Infarction, Peripheral Arterial Disease, Peripheral Venous Disease, Phlebitis Gastrointestinal Denies history of Cirrhosis , Colitis, Crohnoos, Hepatitis A, Hepatitis B, Hepatitis C Endocrine Denies history of Type I Diabetes, Type II Diabetes Genitourinary Denies history of End Stage Renal Disease Immunological Denies history of Lupus Erythematosus, Raynaudoos, Scleroderma Integumentary (Skin) Patient has history of History of pressure wounds Denies history of History of Burn Musculoskeletal Patient has history of Osteomyelitis - Knee Surgery right dt infection Denies history of Gout, Rheumatoid Arthritis, Osteoarthritis Neurologic Patient has history of Neuropathy - inflammatory and toxic Denies history of Dementia, Quadriplegia, Paraplegia, Seizure Disorder Oncologic Denies history of Received Chemotherapy, Received Radiation Psychiatric Denies history of Anorexia/bulimia, Confinement Anxiety Medical A Surgical History Notes nd Constitutional Symptoms (General Health) chronic pain, foot pain, tooth pain, irregular periods, allergic rhinitis, continuous opioid dependence, long term drug therapy Integumentary (Skin) ulcer of foot Musculoskeletal muscular dystrophy, acquired deformities of the toe, closed dislocation of trasometatarsal joint, , abscess or cellulitis of foot, Neurologic neuropathic arthritis, neuropathic pain, paraneoplastic neuropahty Psychiatric anxiety, depression Objective Constitutional sitting or standing blood pressure is within target range for patient.. pulse regular and within target range for patient.Marland Kitchen respirations regular, non-labored and within target range for patient.Marland Kitchen temperature within target range for patient.. Well-nourished and well-hydrated in no acute distress. Vitals Time Taken: 8:50 AM, Height: 67 in, Weight: 153 lbs, BMI: 24, Temperature: 98.2 F, Pulse: 77 bpm, Respiratory Rate: 18 breaths/min, Blood Pressure: 99/65  mmHg. Eyes conjunctiva clear no eyelid edema noted. pupils equal round and reactive to light and accommodation. Ears, Nose, Mouth, and Throat no gross abnormality of ear auricles or  external auditory canals. normal hearing noted during conversation. mucus membranes moist. Respiratory normal breathing without difficulty. Cardiovascular 2+ dorsalis pedis/posterior tibialis pulses. no clubbing, cyanosis, significant edema, Psychiatric this patient is able to make decisions and demonstrates good insight into disease process. Alert and Oriented x 3. pleasant and cooperative. IESHA, SUMMERHILL ([id number]) [id number] Page 9 of 12 General Notes: Upon inspection patient's wound bed actually showed signs of poor granulation and blistering noted over the tip of the toe and this was her right third toe. She subsequently did require sharp debridement today to clearway some of the necrotic debris and overall she tolerated that debridement without complication this includes removing the nail. The nailbed was exposed there was no direct bone exposure I definitely think she needs to start doxycycline but she needs to monitor this closely if the redness and erythema spreads she should go to the ER ASAP. She voiced understanding. We did obtain a wound culture from the deepest part of the wound once this was debrided away and we will see what the results of that show. She is in agreement with that plan. Integumentary (Hair, Skin) Wound #5 status is Open. Original cause of wound was Gradually Appeared. The date acquired was: 05/10/2022. The wound is located on the Right T Third. oe The wound measures 1.5cm length x 1.7cm width x 0.6cm depth; 2.003cm^2 area and 1.202cm^3 volume. There is Fat Layer (Subcutaneous Tissue) exposed. There is no tunneling or undermining noted. There is a large amount of sanguinous drainage noted. The wound margin is well defined and not attached to the wound  base. There is no granulation within the wound bed. There is no necrotic tissue within the wound bed. Assessment Active Problems ICD-10 Other chronic osteomyelitis, right ankle and foot Non-pressure chronic ulcer of other part of right foot with fat layer exposed Muscular dystrophy, unspecified Hereditary motor and sensory neuropathy Corns and callosities Procedures Wound #5 Pre-procedure diagnosis of Wound #5 is an Infection - not elsewhere classified located on the Right T Third . There was a Selective/Open Wound oe Skin/Epidermis Debridement with a total area of 2 sq cm performed by Allen Derry, [name]. With the following instrument(s): Curette, and Scissors to remove Viable and Non-Viable tissue/material. Material removed includes Callus, Skin: Dermis, Skin: Epidermis, and Other: nail. 1 specimen was taken by a Swab and sent to the lab per facility protocol. A time out was conducted at 09:45, prior to the start of the procedure. A Moderate amount of bleeding was controlled with Pressure. The procedure was tolerated well. Post Debridement Measurements: 1.5cm length x 1.7cm width x 0.6cm depth; 1.202cm^3 volume. Character of Wound/Ulcer Post Debridement is stable. Post procedure Diagnosis Wound #5: Same as Pre-Procedure Plan Follow-up Appointments: Return Appointment in 1 week. Nurse Visit as needed Bathing/ Shower/ Hygiene: May shower with wound dressing protected with water repellent cover or cast protector. Edema Control - Lymphedema / Segmental Compressive Device / Other: Elevate, Exercise Daily and Avoid Standing for Long Periods of Time. Elevate legs to the level of the heart and pump ankles as often as possible Elevate leg(s) parallel to the floor when sitting. Additional Orders / Instructions: Go to Emergency Department of your choice for evaluation and treatment. - Start your antibiotic once you get home. If there are changes in redness, swelling, fever, chills, etc. go to the  nearest emergency department. The following medication(s) was prescribed: doxycycline hyclate oral 100 mg tablet 1 1 tablet oral twice a day for 14 days for right toe  infection starting 05/13/2022 WOUND #5: - T Third Wound Laterality: Right oe Cleanser: Vashe 5.8 (oz) Discharge Instructions: Use vashe 5.8 (oz) as directed Prim Dressing: Hydrofera Blue Ready Transfer Foam, 2.5x2.5 (in/in) ary Discharge Instructions: Apply Hydrofera Blue Ready to wound bed as directed Secondary Dressing: Conforming Guaze Roll-Medium Discharge Instructions: Apply Conforming Stretch Guaze Bandage as directed Secured With: Medipore T - 33M Medipore H Soft Cloth Surgical T ape ape, 2x2 (in/yd) 1. I would recommend at this point that we have the patient continue to monitor for any signs of infection or worsening. Based on what I am seeing I do believe that she is tolerating the dressing changes without complication this is great news. 2. I am good recommend as well that she should continue with the [facility] that we initiate today she is used this before. 3. We will use some rolled gauze or Band-Aid to apply and hold in place. 4. Based on the wound culture will make any adjustments as necessary for now she is going to start the doxycycline as previously prescribed we will make adjustments based on the culture results. We will see patient back for reevaluation in 1 week here in the clinic. If anything worsens or changes patient will contact our office for additional recommendations. Kelly, [name] ([id number]) [id number] Page 10 of 12 Electronic Signature(s) Signed: 05/13/2022 5:35:18 PM By: Allen Derry [name] Entered By: Allen [name] on 05/13/2022 17:35:18 -------------------------------------------------------------------------------- ROS/PFSH Details Patient Name: Date of Service: [name], Kelly Mad. 05/13/2022 8:30 A M Medical Record Number: [id number] Patient Account Number:  [id number] Date of Birth/Sex: Treating RN: [birth year] (47 y.o. Female) Huel Coventry Primary Care Provider: Marikay Alar Other Clinician: Referring Provider: Treating Provider/Extender: Allen [name] Self, Referral Weeks in Treatment: 0 Information Obtained From Patient Constitutional Symptoms (General Health) Medical History: Past Medical History Notes: chronic pain, foot pain, tooth pain, irregular periods, allergic rhinitis, continuous opioid dependence, long term drug therapy Eyes Medical History: Negative for: Cataracts; Glaucoma; Optic Neuritis Ear/Nose/Mouth/Throat Medical History: Negative for: Chronic sinus problems/congestion; Middle ear problems Hematologic/Lymphatic Medical History: Positive for: Anemia Negative for: Hemophilia; Human Immunodeficiency Virus; Lymphedema; Sickle Cell Disease Respiratory Medical History: Positive for: Asthma Negative for: Chronic Obstructive Pulmonary Disease (COPD); Pneumothorax; Sleep Apnea; Tuberculosis Cardiovascular Medical History: Negative for: Arrhythmia; Congestive Heart Failure; Coronary Artery Disease; Deep Vein Thrombosis; Hypertension; Hypotension; Myocardial Infarction; Peripheral Arterial Disease; Peripheral Venous Disease; Phlebitis Gastrointestinal Medical History: Negative for: Cirrhosis ; Colitis; Crohns; Hepatitis A; Hepatitis B; Hepatitis C Endocrine Medical History: Negative for: Type I Diabetes; Type II Diabetes Genitourinary Medical History: Negative for: End Stage Renal Disease Immunological Medical History: Negative for: Lupus Erythematosus; Raynauds; Scleroderma Integumentary (Skin) Medical History: Positive for: History of pressure wounds Negative for: History of Burn ADNA, NOFZIGER ([id number]) [id number] Page 11 of 12 Past Medical History Notes: ulcer of foot Musculoskeletal Medical History: Positive for: Osteomyelitis - Knee Surgery right dt infection Negative  for: Gout; Rheumatoid Arthritis; Osteoarthritis Past Medical History Notes: muscular dystrophy, acquired deformities of the toe, closed dislocation of trasometatarsal joint, , abscess or cellulitis of foot, Neurologic Medical History: Positive for: Neuropathy - inflammatory and toxic Negative for: Dementia; Quadriplegia; Paraplegia; Seizure Disorder Past Medical History Notes: neuropathic arthritis, neuropathic pain, paraneoplastic neuropahty Oncologic Medical History: Negative for: Received Chemotherapy; Received Radiation Psychiatric Medical History: Negative for: Anorexia/bulimia; Confinement Anxiety Past Medical History Notes: anxiety, depression Immunizations Pneumococcal Vaccine: Received Pneumococcal Vaccination: Yes Received Pneumococcal Vaccination On or After 60th Birthday: No Implantable Devices None Family and Social History Cancer:  No; Diabetes: Yes; Heart Disease: No; Hypertension: Yes - Father; Kidney Disease: No; Lung Disease: No; Seizures: No; Stroke: No; Thyroid Problems: No; Tuberculosis: No; Current every day smoker; Marital Status - Widowed; Alcohol Use: Rarely; Drug Use: No History; Caffeine Use: Daily; Financial Concerns: No; Food, Clothing or Shelter Needs: No; Support System Lacking: No; Transportation Concerns: No Psychologist, prison and probation services) Signed: 05/13/2022 3:23:16 PM By: Elliot Gurney, BSN, RN, CWS, Kim RN, BSN Signed: 05/13/2022 5:47:58 PM By: Allen Derry [name] Entered By: Elliot Gurney, BSN, RN, CWS, Kim on 05/13/2022 09:00:08 -------------------------------------------------------------------------------- SuperBill Details Patient Name: Date of Service: [name], Kelly Mad. 05/13/2022 Medical Record Number: [id number] Patient Account Number: [id number] Date of Birth/Sex: Treating RN: [birth year] (47 y.o. 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

## 2022-05-14 NOTE — Progress Notes (Signed)
Kelly, [name] ([id number]) [id number] Page 1 of 9 Visit Report for 05/13/2022 Allergy List Details Patient Name: Date of Service: [name]. 05/13/2022 8:30 A [name]: [id number] Patient Account Number: [id number] Date of Birth/Sex: Treating RN: 1975-07-15 (47 y.o. Skip Mayer Primary Care Nannie Starzyk: Marikay Alar Other Clinician: Referring Shadell Brenn: Treating Kamilia Carollo/Extender: Allen Derry Self, Referral Weeks in Treatment: 0 Allergies Active Allergies penicillin Reaction: hives Bactrim Reaction: breathing issues Sulfa (Sulfonamide Antibiotics) tramadol HCl clindamycin bee venom protein (honey bee) Allergy Notes Electronic Signature(s) Signed: 05/13/2022 3:23:16 PM By: Elliot Gurney, BSN, RN, CWS, Kim RN, BSN Entered By: Elliot Gurney, BSN, RN, CWS, Kim on 05/13/2022 08:58:31 -------------------------------------------------------------------------------- Arrival Information Details Patient Name: Date of Service: [name], [name]. 05/13/2022 8:30 A [name]: [id number] Patient Account Number: [id number] Date of Birth/Sex: Treating RN: 1975/04/28 (47 y.o. Skip Mayer Primary Care Vollie Aaron: Marikay Alar Other Clinician: Referring Avenell Sellers: Treating Mekisha Bittel/Extender: Allen Derry Self, Referral Weeks in Treatment: 0 Visit Information Patient Arrived: Ambulatory Arrival Time: 08:46 Accompanied By: self Transfer Assistance: None Patient Identification Verified: Yes Secondary Verification Process Completed: Yes Patient Requires Transmission-Based Precautions: No Patient Has Alerts: Yes Patient Alerts: Patient on Blood Thinner NOT DIABETIC 325 Aspirin History Since Last Visit Added or deleted any medications: Yes Hospitalized since last visit: Yes Electronic Signature(s) Signed: 05/13/2022 9:26:14 AM By: Elliot Gurney, BSN, RN, CWS, Kim RN, BSN Entered By: Elliot Gurney, BSN, RN, CWS, Kim on 05/13/2022  09:26:13 -------------------------------------------------------------------------------- Clinic Level of Care Assessment Details Patient Name: Date of Service: [name], [name]. 05/13/2022 8:30 A [name]: [id number] Patient Account Number: [id number] Kelly, [name] ([id number]) [id number] Page 2 of 9 Date of Birth/Sex: Treating RN: [birth year] (47 y.o. Skip Mayer Primary Care Crystallee Werden: Marikay Alar Other Clinician: Referring Geddy Boydstun: Treating Fiona Coto/Extender: Allen Derry Self, Referral Weeks in Treatment: 0 Clinic Level of Care Assessment Items TOOL 1 Quantity Score []  - 0 Use when EandM and Procedure is performed on INITIAL visit ASSESSMENTS - Nursing Assessment / Reassessment X- 1 20 General Physical Exam (combine w/ comprehensive assessment (listed just below) when performed on new pt. evals) X- 1 25 Comprehensive Assessment (HX, ROS, Risk Assessments, Wounds Hx, etc.) ASSESSMENTS - Wound and Skin Assessment / Reassessment []  - 0 Dermatologic / Skin Assessment (not related to wound area) ASSESSMENTS - Ostomy and/or Continence Assessment and Care []  - 0 Incontinence Assessment and Management []  - 0 Ostomy Care Assessment and Management (repouching, etc.) PROCESS - Coordination of Care X - Simple Patient / Family Education for ongoing care 1 15 []  - 0 Complex (extensive) Patient / Family Education for ongoing care X- 1 10 Staff obtains Chiropractor, Records, T Results / Process Orders est []  - 0 Staff telephones HHA, Nursing Homes / Clarify orders / etc []  - 0 Routine Transfer to another Facility (non-emergent condition) []  - 0 Routine Hospital Admission (non-emergent condition) X- 1 15 New Admissions / Manufacturing engineer / Ordering NPWT Apligraf, etc. , []  - 0 Emergency Hospital Admission (emergent condition) PROCESS - Special Needs []  - 0 Pediatric / Minor Patient Management []  - 0 Isolation Patient  Management []  - 0 Hearing / Language / Visual special needs []  - 0 Assessment of Community assistance (transportation, D/C planning, etc.) []  - 0 Additional assistance / Altered mentation []  - 0 Support Surface(s) Assessment (bed, cushion, seat, etc.) INTERVENTIONS - Miscellaneous []  - 0 External ear exam []  - 0 Patient Transfer (multiple staff / Nurse, adult / Similar  devices) []  - 0 Simple Staple / Suture removal (25 or less) []  - 0 Complex Staple / Suture removal (26 or more) []  - 0 Hypo/Hyperglycemic Management (do not check if billed separately) X- 1 15 Ankle / Brachial Index (ABI) - do not check if billed separately Has the patient been seen at the hospital within the last three years: Yes Total Score: 100 Level Of Care: New/Established - Level 3 Electronic Signature(s) Signed: 05/13/2022 3:23:16 PM By: Elliot Gurney, BSN, RN, CWS, Kim RN, BSN Entered By: Elliot Gurney, BSN, RN, CWS, Kim on 05/13/2022 10:00:32 -------------------------------------------------------------------------------- Encounter Discharge Information Details Patient Name: Date of Service: [name], [name]. 05/13/2022 8:30 A [name]: [id number] Patient Account Number: [id number] [name], [name] ([id number]) [id number] Page 3 of 9 Date of Birth/Sex: Treating RN: Jun 02, 1975 (47 y.o. Skip Mayer Primary Care Yamila Cragin: Marikay Alar Other Clinician: Referring Raguel Kosloski: Treating Gao Mitnick/Extender: Allen Derry Self, Referral Weeks in Treatment: 0 Encounter Discharge Information Items Post Procedure Vitals Discharge Condition: Stable Temperature (F): 98.2 Ambulatory Status: Ambulatory Pulse (bpm): 77 Discharge Destination: Home Respiratory Rate (breaths/min): 16 Transportation: Private Auto Blood Pressure (mmHg): 99/65 Schedule Follow-up Appointment: Yes Clinical Summary of Care: Electronic Signature(s) Signed: 05/13/2022 10:17:36 AM By: Elliot Gurney, BSN, RN, CWS, Kim RN,  BSN Entered By: Elliot Gurney, BSN, RN, CWS, Kim on 05/13/2022 10:17:36 -------------------------------------------------------------------------------- Lower Extremity Assessment Details Patient Name: Date of Service: [name], [name]. 05/13/2022 8:30 A [name]: [id number] Patient Account Number: [id number] Date of Birth/Sex: Treating RN: 05-19-1975 (47 y.o. Skip Mayer Primary Care Kalven Ganim: Marikay Alar Other Clinician: Referring Kela Baccari: Treating Janiel Derhammer/Extender: Allen Derry Self, Referral Weeks in Treatment: 0 Edema Assessment Assessed: [Left: No] [Right: Yes] Edema: [Left: Ye] [Right: s] Calf Left: Right: Point of Measurement: 37 cm From Medial Instep 42.5 cm Ankle Left: Right: Point of Measurement: 11 cm From Medial Instep 27 cm Vascular Assessment Blood Pressure: Brachial: [Right:110] Dorsalis Pedis: 80 Ankle: Posterior Tibial: 95 Ankle Brachial Index: [Right:0.86] Electronic Signature(s) Signed: 05/13/2022 3:23:16 PM By: Elliot Gurney, BSN, RN, CWS, Kim RN, BSN Entered By: Elliot Gurney, BSN, RN, CWS, Kim on 05/13/2022 09:10:11 -------------------------------------------------------------------------------- Multi Wound Chart Details Patient Name: Date of Service: [name], [name]. 05/13/2022 8:30 A [name]: [id number] Patient Account Number: [id number] Date of Birth/Sex: Treating RN: [birth year] (47 y.o. Skip Mayer Primary Care Ryllie Nieland: Marikay Alar Other Clinician: Referring Soraiya Ahner: Treating Delva Derden/Extender: Allen Derry Self, Referral Weeks in Treatment: 0 Vital Signs Height(in): 67 Pulse(bpm): 77 Weight(lbs): 153 Blood Pressure(mmHg): 99/65 Body Mass Index(BMI): 24 Temperature(F): 98.2 Petrich, Dai [name] ([id number]) [id number] Page 4 of 9 Respiratory Rate(breaths/min): 18 [5:Photos:] [N/A:N/A] Right T Third oe N/A N/A Wound Location: Gradually Appeared N/A N/A Wounding Event: Infection - not  elsewhere classified N/A N/A Primary Etiology: Anemia, Asthma, History of pressure N/A N/A Comorbid History: wounds, Osteomyelitis, Neuropathy 05/10/2022 N/A N/A Date Acquired: 0 N/A N/A Weeks of Treatment: Open N/A N/A Wound Status: No N/A N/A Wound Recurrence: Yes N/A N/A Pending A mputation on Presentation: 1.5x1.7x0.6 N/A N/A Measurements L x W x D (cm) 2.003 N/A N/A A (cm) : rea 1.202 N/A N/A Volume (cm) : Full Thickness Without Exposed N/A N/A Classification: Support Structures Large N/A N/A Exudate Amount: Sanguinous N/A N/A Exudate Type: red N/A N/A Exudate Color: Well defined, not attached N/A N/A Wound Margin: None Present (0%) N/A N/A Granulation Amount: None Present (0%) N/A N/A Necrotic Amount: Fat Layer (Subcutaneous Tissue): Yes N/A N/A Exposed Structures: Fascia: No Tendon: No  Muscle: No Joint: No Bone: No None N/A N/A Epithelialization: Treatment Notes Electronic Signature(s) Signed: 05/13/2022 3:23:16 PM By: Elliot Gurney, BSN, RN, CWS, Kim RN, BSN Entered By: Elliot Gurney, BSN, RN, CWS, Kim on 05/13/2022 09:46:46 -------------------------------------------------------------------------------- Multi-Disciplinary Care Plan Details Patient Name: Date of Service: [name], [place]. 05/13/2022 8:30 A [name]: [id number] Patient Account Number: [id number] Date of Birth/Sex: Treating RN: 05-03-1975 (47 y.o. Skip Mayer Primary Care Hamsa Laurich: Marikay Alar Other Clinician: Referring Avaiah Stempel: Treating Sahmir Weatherbee/Extender: Allen Derry Self, Referral Weeks in Treatment: 0 Active Inactive Necrotic Tissue AIDYN, SPORTSMAN ([id number]) [id number] Page 5 of 9 Nursing Diagnoses: Impaired tissue integrity related to necrotic/devitalized tissue Knowledge deficit related to management of necrotic/devitalized tissue Goals: Necrotic/devitalized tissue will be minimized in the wound bed Date Initiated:  05/13/2022 Target Resolution Date: 05/13/2022 Goal Status: Active Patient/caregiver will verbalize understanding of reason and process for debridement of necrotic tissue Date Initiated: 05/13/2022 Target Resolution Date: 05/13/2022 Goal Status: Active Interventions: Assess patient pain level pre-, during and post procedure and prior to discharge Provide education on necrotic tissue and debridement process Treatment Activities: Apply topical anesthetic as ordered : 05/13/2022 Excisional debridement : 05/13/2022 Notes: Orientation to the Wound Care Program Nursing Diagnoses: Knowledge deficit related to the wound healing center program Goals: Patient/caregiver will verbalize understanding of the Wound Healing Center Program Date Initiated: 05/13/2022 Target Resolution Date: 05/13/2022 Goal Status: Active Interventions: Provide education on orientation to the wound center Notes: Pain, Acute or Chronic Nursing Diagnoses: Pain Management - Non-cyclic Acute (Procedural) Potential alteration in comfort, pain Goals: Patient will verbalize adequate pain control and receive pain control interventions during procedures as needed Date Initiated: 05/13/2022 Target Resolution Date: 05/13/2022 Goal Status: Active Interventions: Complete pain assessment as per visit requirements Reposition patient for comfort Notes: Peripheral Neuropathy Nursing Diagnoses: Knowledge deficit related to disease process and management of peripheral neurovascular dysfunction Potential alteration in peripheral tissue perfusion (select prior to confirmation of diagnosis) Goals: Patient/caregiver will verbalize understanding of disease process and disease management Date Initiated: 05/13/2022 Target Resolution Date: 05/13/2022 Goal Status: Active Interventions: Assess signs and symptoms of neuropathy upon admission and as needed Provide education on Management of Neuropathy and Related Ulcers Provide education on  Management of Neuropathy upon discharge from the Wound Center Notes: Soft Tissue Infection Nursing Diagnoses: Impaired tissue integrity Knowledge deficit related to disease process and management Knowledge deficit related to home infection control: handwashing, handling of soiled dressings, supply storage ORVILLE, WIDMANN ([id number]) [id number] Page 6 of 9 Goals: Patient/caregiver will verbalize understanding of or measures to prevent infection and contamination in the home setting Date Initiated: 05/13/2022 Target Resolution Date: 05/13/2022 Goal Status: Active Patient's soft tissue infection will resolve Date Initiated: 05/13/2022 Target Resolution Date: 05/20/2022 Goal Status: Active Signs and symptoms of infection will be recognized early to allow for prompt treatment Date Initiated: 05/13/2022 Target Resolution Date: 05/13/2022 Goal Status: Active Interventions: Assess signs and symptoms of infection every visit Provide education on infection Treatment Activities: Culture and sensitivity : 05/13/2022 Systemic antibiotics : 05/13/2022 Notes: Wound/Skin Impairment Nursing Diagnoses: Impaired tissue integrity Knowledge deficit related to smoking impact on wound healing Knowledge deficit related to ulceration/compromised skin integrity Goals: Patient/caregiver will verbalize understanding of skin care regimen Date Initiated: 05/13/2022 Target Resolution Date: 05/13/2022 Goal Status: Active Ulcer/skin breakdown will have a volume reduction of 30% by week 4 Date Initiated: 05/13/2022 Target Resolution Date: 05/27/2022 Goal Status: Active Ulcer/skin breakdown will have a volume reduction of 50% by week 8 Date Initiated:  05/13/2022 Target Resolution Date: 06/24/2022 Goal Status: Active Ulcer/skin breakdown will have a volume reduction of 80% by week 12 Date Initiated: 05/13/2022 Target Resolution Date: 07/22/2022 Goal Status: Active Ulcer/skin breakdown will  heal within 14 weeks Date Initiated: 05/13/2022 Target Resolution Date: 08/05/2022 Goal Status: Active Interventions: Assess patient/caregiver ability to obtain necessary supplies Assess patient/caregiver ability to perform ulcer/skin care regimen upon admission and as needed Assess ulceration(s) every visit Provide education on ulcer and skin care Treatment Activities: Skin care regimen initiated : 05/13/2022 Topical wound management initiated : 05/13/2022 Notes: Electronic Signature(s) Signed: 05/13/2022 10:13:32 AM By: Elliot Gurney, BSN, RN, CWS, Kim RN, BSN Entered By: Elliot Gurney, BSN, RN, CWS, Kim on 05/13/2022 10:13:32 -------------------------------------------------------------------------------- Pain Assessment Details Patient Name: Date of Service: [name], [name]. 05/13/2022 8:30 A [name]: [id number] Patient Account Number: [id number] Date of Birth/Sex: Treating RN: 1976/01/04 (47 y.o. Skip Mayer Primary Care Jullian Previti: Marikay Alar Other Clinician: Referring Isidoro Santillana: Treating Carrington Mullenax/Extender: Allen Derry Self, Referral Weeks in Treatment: 0 Active Problems Hegarty, Chalese Judie Petit ([id number]) [id number] Page 7 of 9 Location of Pain Severity and Description of Pain Patient Has Paino Yes Site Locations Pain Location: Pain in Ulcers Rate the pain. Current Pain Level: 3 Pain Management and Medication Current Pain Management: Notes pain when walking Electronic Signature(s) Signed: 05/13/2022 3:23:16 PM By: Elliot Gurney, BSN, RN, CWS, Kim RN, BSN Entered By: Elliot Gurney, BSN, RN, CWS, Kim on 05/13/2022 08:50:40 -------------------------------------------------------------------------------- Patient/Caregiver Education Details Patient Name: Date of Service: Kelly M, MA TTIE [name]. 4/25/2024andnbsp8:30 A [name]: [id number] Patient Account Number: [id number] Date of Birth/Gender: Treating RN: 10-17-1975 (47 y.o. Skip Mayer Primary  Care Physician: Marikay Alar Other Clinician: Referring Physician: Treating Physician/Extender: Allen Derry Self, Referral Weeks in Treatment: 0 Education Assessment Education Provided To: Patient Education Topics Provided Infection: Handouts: Hygiene and Infection Prevention Responses: State content correctly Peripheral Neuropathy: Handouts: General Foot Care, How to Avoid Amputation, Neuropathy Methods: Explain/Verbal Responses: State content correctly Smoking and Wound Healing: Handouts: Smoking and Wound Healing Methods: Explain/Verbal Responses: State content correctly Wound Debridement: Handouts: Wound Debridement Methods: Explain/Verbal Responses: State content correctly Electronic Signature(s) Signed: 05/13/2022 3:23:16 PM By: Elliot Gurney, BSN, RN, CWS, Kim RN, BSN Chiusano, Melek [name] ([id number]) By: Elliot Gurney, BSN, RN, CWS, Kim RN, BSN [phone].pdf Page 8 of 9 Signed: 05/13/2022 3:23:16 PM Entered By: Elliot Gurney, BSN, RN, CWS, Kim on 05/13/2022 10:15:56 -------------------------------------------------------------------------------- Wound Assessment Details Patient Name: Date of Service: [name]. 05/13/2022 8:30 A [name]: [id number] Patient Account Number: [id number] Date of Birth/Sex: Treating RN: 1975/12/12 (47 y.o. Cathlean Cower, Kim Primary Care Randy Whitener: Marikay Alar Other Clinician: Referring Arden Axon: Treating Dory Demont/Extender: Allen Derry Self, Referral Weeks in Treatment: 0 Wound Status Wound Number: 5 Primary Infection - not elsewhere classified Etiology: Wound Location: Right T Third oe Wound Status: Open Wounding Event: Gradually Appeared Comorbid Anemia, Asthma, History of pressure wounds, Osteomyelitis, Date Acquired: 05/10/2022 History: Neuropathy Weeks Of Treatment: 0 Clustered Wound: No Pending Amputation On Presentation Photos Wound Measurements Length: (cm) 1.5 Width: (cm) 1.7 Depth: (cm)  0.6 Area: (cm) 2.003 Volume: (cm) 1.202 % Reduction in Area: % Reduction in Volume: Epithelialization: None Tunneling: No Undermining: No Wound Description Classification: Full Thickness Without Exposed Support Structures Wound Margin: Well defined, not attached Exudate Amount: Large Exudate Type: Sanguinous Exudate Color: red Foul Odor After Cleansing: No Slough/Fibrino No Wound Bed Granulation Amount: None Present (0%) Exposed Structure Necrotic Amount: None Present (0%) Fascia Exposed: No Fat Layer (Subcutaneous Tissue)  Exposed: Yes Tendon Exposed: No Muscle Exposed: No Joint Exposed: No Bone Exposed: No Treatment Notes Wound #5 (Toe Third) Wound Laterality: Right Cleanser Vashe 5.8 (oz) Discharge Instruction: Use vashe 5.8 (oz) as directed Peri-Wound Care Topical Primary Dressing [address] Ready Transfer Foam, 2.5x2.5 (in/in) Babinski, Henessy [name] ([id number]) [id number] Page 9 of 9 Discharge Instruction: Apply Hydrofera Blue Ready to wound bed as directed Secondary Dressing Conforming Guaze Roll-Medium Discharge Instruction: Apply Conforming Stretch Guaze Bandage as directed Secured With Medipore T - 40M Medipore H Soft Cloth Surgical T ape ape, 2x2 (in/yd) Compression Wrap Compression Stockings Facilities manager) Signed: 05/13/2022 3:23:16 PM By: Elliot Gurney, BSN, RN, CWS, Kim RN, BSN Entered By: Elliot Gurney, BSN, RN, CWS, Kim on 05/13/2022 09:46:34 -------------------------------------------------------------------------------- Vitals Details Patient Name: Date of Service: [name], [name]. 05/13/2022 8:30 A [name]: [id number] Patient Account Number: [id number] Date of Birth/Sex: Treating RN: 10-Jan-1976 (47 y.o. Skip Mayer Primary Care Nataliyah Packham: Marikay Alar Other Clinician: Referring Shubham Thackston: Treating Zakyra Kukuk/Extender: Allen Derry Self, Referral Weeks in Treatment: 0 Vital Signs Time Taken:  08:50 Temperature (F): 98.2 Height (in): 67 Pulse (bpm): 77 Weight (lbs): 153 Respiratory Rate (breaths/min): 18 Body Mass Index (BMI): 24 Blood Pressure (mmHg): 99/65 Reference Range: 80 - 120 mg / dl Electronic Signature(s) Signed: 05/13/2022 3:23:16 PM By: Elliot Gurney, BSN, RN, CWS, Kim RN, BSN Entered By: Elliot Gurney, BSN, RN, CWS, Kim on 05/13/2022 08:51:10

## 2022-05-17 LAB — AEROBIC CULTURE W GRAM STAIN (SUPERFICIAL SPECIMEN)

## 2022-05-19 ENCOUNTER — Ambulatory Visit: Payer: Medicare HMO | Admitting: Internal Medicine

## 2022-05-20 ENCOUNTER — Ambulatory Visit: Payer: Medicare HMO | Admitting: Physician Assistant

## 2022-05-24 ENCOUNTER — Ambulatory Visit (INDEPENDENT_AMBULATORY_CARE_PROVIDER_SITE_OTHER): Payer: Medicare HMO | Admitting: Psychology

## 2022-05-24 DIAGNOSIS — F331 Major depressive disorder, recurrent, moderate: Secondary | ICD-10-CM | POA: Diagnosis not present

## 2022-05-24 NOTE — Progress Notes (Signed)
[name] is a 47 y.o. female patient .  Diagnosis 296.30 (Major depressive affective disorder, recurrent episode, unspecified) [n/a]  Symptoms Depressed or irritable mood. (Status: maintained) -- No Description Entered  Diminished interest in or enjoyment of activities. (Status: maintained) -- No Description Entered  Feelings of hopelessness, worthlessness, or inappropriate guilt. (Status: maintained) -- No Description Entered  Lack of energy. (Status: maintained) -- No Description Entered  Unresolved grief issues. (Status: maintained) -- No Description Entered  Medication Status compliance  Safety none  If Suicidal or Homicidal State Action Taken: unspecified  Current Risk: low Medications Hydroxyzine (Dosage: unknown)  Remiron (Dosage: unknown)  Sertraline (Dosage: unknown)  Objectives Related Problem: Appropriately grieve the loss in order to normalize mood and to return to previously adaptive level of functioning. Description: Increasingly verbalize hopeful and positive statements regarding self, others, and the future. Target Date: 2022-07-15 Frequency: Daily Modality: individual Progress: 90%  Related Problem: Appropriately grieve the loss in order to normalize mood and to return to previously adaptive level of functioning. Description: Learn and implement conflict resolution skills to resolve interpersonal problems. Target Date: 2022-07-15 Frequency: Daily Modality: individual Progress: 85%  Related Problem: Appropriately grieve the loss in order to normalize mood and to return to previously adaptive level of functioning. Description: Learn and implement problem-solving and decision-making skills. Target Date: 2022-07-15 Frequency: Daily Modality: individual Progress: 85%  Related Problem: Appropriately grieve the loss in order to normalize mood and to return to previously adaptive level of functioning. Description: Describe  current and past experiences with depression including their impact on functioning and attempts to resolve it. Target Date: 2021-07-14 Frequency: Daily Modality: individual Progress: 100%-completed  Related Problem: Complete the process of letting go of the lost significant other. Description: Report decreased time spent each day focusing on the loss. Target Date: 2022-07-15 Frequency: Daily Modality: individual Progress: 85%  Related Problem: Complete the process of letting go of the lost significant other. Description: Decrease unrealistic thoughts, statements, and feelings of being responsible for the loss. Target Date: 2021-07-14 Frequency: Daily Modality: individual Progress: 100%-completed  Related Problem: Complete the process of letting go of the lost significant other. Description: Verbalize and resolve feelings of anger or guilt focused on self or deceased loved one that interfere with the grieving process. Target Date: 2021-07-14 Frequency: Daily Modality: individual Progress: 100%-completed  Related Problem: Complete the process of letting go of the lost significant other. Description: Begin verbalizing feelings associated with the loss. Target Date: 2022-07-15 Frequency: Daily Modality: individual Progress: 80%  Related Problem: Complete the process of letting go of the lost significant other. Description: Identify what stages of grief have been experienced in the continuum of the grieving process. Target Date: 2021-07-14 Frequency: Daily Modality: individual Progress: 100%-completed  Related Problem: Complete the process of letting go of the lost significant other. Description: Participate in a therapy that addresses issues beyond grief that have arisen as a result of the loss. Target Date: 2022-07-15 Frequency: Daily Modality: individual Progress: 85%  Related Problem: Complete the process of letting go of the lost significant other. Description: Tell in  detail the story of the current loss that is triggering symptoms. Target Date: 2021-07-14 Frequency: Daily Modality: individual Progress: 100%-completed  Client Response full compliance  Service Location Location, [address] [place], [place] [address]  Service Code cpt [phone]  Validate/empathize  Related past to present  Behavioral activation plan  Facilitate problem solving  Identify automatic thoughts  Rationally challenge thoughts or beliefs/cognitive restructuring  Identify/label emotions  Emotion regulation skills  Psychiatrist  Self care activities  Self-monitoring  Session Notes: Patient requests a video Webex session. She is at home and I am at my home office.  Dx: Major Depression  Meds: Lexapro (20mg ), Busperone (7.5)  Goals: States that she is seeking counseling to learn to manage the grief reaction to losing her husband. She reports feeling tired all the time and that she has no energy or motivation. Needs to develop coping strategies. Therapy to focus on reducing despair, sadness, hopelessness and helplessness. 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 Will explore those early traumas/losses and current triggers. Goal dates: 04-2021 Revised Goals:6-24 Patient says that she is having trouble sleeping. She also says that she is thinking that she is "eating in her sleep". She talked about being socially isolated and not really trusting people. That is, she doesn't feel that other people (other than her daughter and her family) really care about her. She talked about her guilt for some past behaviors, but is able to say that she is now "a different person". She is still trying to improve on her ability to "say no" or set limits. This is problematic in that it interferes with her ability to complete tasks important to her, like her school. She is still trying to do school work but is again behind. We talked about  making choices that support her priorities. Decided on a plan to best her her complete her school work, which she will try this morning.                                                                    Garrel Ridgel, PhD Time: 7:45a-8:30a 45 minutes

## 2022-05-31 ENCOUNTER — Other Ambulatory Visit: Payer: Self-pay | Admitting: Family

## 2022-05-31 ENCOUNTER — Encounter: Payer: Medicare HMO | Attending: Physician Assistant | Admitting: Physician Assistant

## 2022-05-31 DIAGNOSIS — L0889 Other specified local infections of the skin and subcutaneous tissue: Secondary | ICD-10-CM | POA: Diagnosis not present

## 2022-05-31 DIAGNOSIS — L97512 Non-pressure chronic ulcer of other part of right foot with fat layer exposed: Secondary | ICD-10-CM | POA: Insufficient documentation

## 2022-05-31 DIAGNOSIS — L89892 Pressure ulcer of other site, stage 2: Secondary | ICD-10-CM | POA: Diagnosis not present

## 2022-05-31 DIAGNOSIS — M86671 Other chronic osteomyelitis, right ankle and foot: Secondary | ICD-10-CM | POA: Insufficient documentation

## 2022-05-31 DIAGNOSIS — F419 Anxiety disorder, unspecified: Secondary | ICD-10-CM

## 2022-05-31 DIAGNOSIS — G7100 Muscular dystrophy, unspecified: Secondary | ICD-10-CM | POA: Diagnosis not present

## 2022-05-31 DIAGNOSIS — G600 Hereditary motor and sensory neuropathy: Secondary | ICD-10-CM | POA: Insufficient documentation

## 2022-05-31 DIAGNOSIS — L84 Corns and callosities: Secondary | ICD-10-CM | POA: Diagnosis not present

## 2022-05-31 NOTE — Progress Notes (Signed)
GALINA, OREA ([id number]) [id number] Page 1 of 1 Visit Report for 05/31/2022 Chief Complaint Document Details Patient Name: Date of Service: [name]. 05/31/2022 8:30 A M Medical Record Number: [id number] Patient Account Number: [id number] Date of Birth/Sex: Treating RN: [birth year] (47 y.o. Ginette Pitman Primary Care Provider: Marikay Alar Other Clinician: Referring Provider: Treating Provider/Extender: Harold Barban in Treatment: 2 Information Obtained from: Patient Chief Complaint Right foot ulcer Electronic Signature(s) Signed: 05/31/2022 8:40:20 AM By: Allen Derry [name] Entered By: Allen Derry on 05/31/2022 08:40:20 -------------------------------------------------------------------------------- Problem List Details Patient Name: Date of Service: [name], [name]. 05/31/2022 8:30 A M Medical Record Number: [id number] Patient Account Number: [id number] Date of Birth/Sex: Treating RN: Nov 29, 1975 (47 y.o. 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

## 2022-06-07 ENCOUNTER — Ambulatory Visit (INDEPENDENT_AMBULATORY_CARE_PROVIDER_SITE_OTHER): Payer: Medicare HMO | Admitting: Psychology

## 2022-06-07 ENCOUNTER — Encounter: Payer: Medicare HMO | Admitting: Physician Assistant

## 2022-06-07 DIAGNOSIS — F331 Major depressive disorder, recurrent, moderate: Secondary | ICD-10-CM | POA: Insufficient documentation

## 2022-06-07 DIAGNOSIS — M86671 Other chronic osteomyelitis, right ankle and foot: Secondary | ICD-10-CM | POA: Diagnosis not present

## 2022-06-07 DIAGNOSIS — F411 Generalized anxiety disorder: Secondary | ICD-10-CM

## 2022-06-07 DIAGNOSIS — G600 Hereditary motor and sensory neuropathy: Secondary | ICD-10-CM | POA: Diagnosis not present

## 2022-06-07 DIAGNOSIS — G7100 Muscular dystrophy, unspecified: Secondary | ICD-10-CM | POA: Diagnosis not present

## 2022-06-07 DIAGNOSIS — L97512 Non-pressure chronic ulcer of other part of right foot with fat layer exposed: Secondary | ICD-10-CM | POA: Diagnosis not present

## 2022-06-07 DIAGNOSIS — L84 Corns and callosities: Secondary | ICD-10-CM | POA: Diagnosis not present

## 2022-06-07 NOTE — Progress Notes (Signed)
[name] is a 47 y.o. female patient .  Diagnosis 296.30 (Major depressive affective disorder, recurrent episode, unspecified) [n/a]  Symptoms Depressed or irritable mood. (Status: maintained) -- No Description Entered  Diminished interest in or enjoyment of activities. (Status: maintained) -- No Description Entered  Feelings of hopelessness, worthlessness, or inappropriate guilt. (Status: maintained) -- No Description Entered  Lack of energy. (Status: maintained) -- No Description Entered  Unresolved grief issues. (Status: maintained) -- No Description Entered  Medication Status compliance  Safety none  If Suicidal or Homicidal State Action Taken: unspecified  Current Risk: low Medications Hydroxyzine (Dosage: unknown)  Remiron (Dosage: unknown)  Sertraline (Dosage: unknown)  Objectives Related Problem: Appropriately grieve the loss in order to normalize mood and to return to previously adaptive level of functioning. Description: Increasingly verbalize hopeful and positive statements regarding self, others, and the future. Target Date: 2022-07-15 Frequency: Daily Modality: individual Progress: 90%  Related Problem: Appropriately grieve the loss in order to normalize mood and to return to previously adaptive level of functioning. Description: Learn and implement conflict resolution skills to resolve interpersonal problems. Target Date: 2022-07-15 Frequency: Daily Modality: individual Progress: 85%  Related Problem: Appropriately grieve the loss in order to normalize mood and to return to previously adaptive level of functioning. Description: Learn and implement problem-solving and decision-making skills. Target Date: 2022-07-15 Frequency: Daily Modality: individual Progress: 85%  Related Problem: Appropriately grieve the loss in order to normalize mood and to return to previously adaptive level of  functioning. Description: Describe current and past experiences with depression including their impact on functioning and attempts to resolve it. Target Date: 2021-07-14 Frequency: Daily Modality: individual Progress: 100%-completed  Related Problem: Complete the process of letting go of the lost significant other. Description: Report decreased time spent each day focusing on the loss. Target Date: 2022-07-15 Frequency: Daily Modality: individual Progress: 85%  Related Problem: Complete the process of letting go of the lost significant other. Description: Decrease unrealistic thoughts, statements, and feelings of being responsible for the loss. Target Date: 2021-07-14 Frequency: Daily Modality: individual Progress: 100%-completed  Related Problem: Complete the process of letting go of the lost significant other. Description: Verbalize and resolve feelings of anger or guilt focused on self or deceased loved one that interfere with the grieving process. Target Date: 2021-07-14 Frequency: Daily Modality: individual Progress: 100%-completed  Related Problem: Complete the process of letting go of the lost significant other. Description: Begin verbalizing feelings associated with the loss. Target Date: 2022-07-15 Frequency: Daily Modality: individual Progress: 80%  Related Problem: Complete the process of letting go of the lost significant other. Description: Identify what stages of grief have been experienced in the continuum of the grieving process. Target Date: 2021-07-14 Frequency: Daily Modality: individual Progress: 100%-completed  Related Problem: Complete the process of letting go of the lost significant other. Description: Participate in a therapy that addresses issues beyond grief that have arisen as a result of the loss. Target Date: 2022-07-15 Frequency: Daily Modality: individual Progress: 85%  Related Problem: Complete the process of letting go of the lost  significant other. Description: Tell in detail the story of the current loss that is triggering symptoms. Target Date: 2021-07-14 Frequency: Daily Modality: individual Progress: 100%-completed  Client Response full compliance  Service Location Location, [address] [place], [place] [address]  Service Code cpt [id number]  Validate/empathize  Related past to present  Behavioral activation plan  Facilitate problem solving  Identify automatic thoughts  Rationally challenge thoughts or beliefs/cognitive restructuring  Identify/label emotions  Emotion regulation skills  Psychiatrist  Self care activities  Self-monitoring  Session Notes: Patient requests a video Caregility session. She is at home and I am at my home office.  Dx: Major Depression  Meds: Lexapro (20mg ), Busperone (7.5)  Goals: States that she is seeking counseling to learn to manage symptoms of depression and anxiety. Will utilize insight oriented therapy and behavioral strategies to manage symptoms. Goal date is 12-24 Session notes: She says that she is feeling a little better today. She is having fears about her daughter and her family going on a cruise. She worries that something bad can happen to them. We talked about rational versus irrational fears and how to manage. She wants to talk with her doctor about her medication for her anxiety. Has been on a number of medicines, so it will be complicated to find the combination of the meds that will address her anxiety and depression. Hilda Lias says that although she is mostly feeling better, her anxiety has remain higher than she would like. Behavioral strategies we have discussed have had minimal impact. She is keeping up with her school work and is optimistic that she will be able to complete her courses.                                                                        Garrel Ridgel, PhD Time: 7:45a-8:30a 45 minutes

## 2022-06-07 NOTE — Progress Notes (Signed)
ITZAYANNA, BENGTSON ([id number]) [id number] Page 1 of 1 Visit Report for 06/07/2022 Chief Complaint Document Details Patient Name: Date of Service: [name]. 06/07/2022 9:00 A M Medical Record Number: [id number] Patient Account Number: [id number] Date of Birth/Sex: Treating RN: 08-20-1975 (47 y.o. Freddy Finner Primary Care Provider: Marikay Alar Other Clinician: Betha Loa Referring Provider: Treating Provider/Extender: Harold Barban in Treatment: 3 Information Obtained from: Patient Chief Complaint Right foot ulcer Electronic Signature(s) Signed: 06/07/2022 9:26:14 AM By: Allen Derry [name] Entered By: Allen Derry on 06/07/2022 09:26:14 -------------------------------------------------------------------------------- Problem List Details Patient Name: Date of Service: [name], [name]. 06/07/2022 9:00 A M Medical Record Number: [id number] Patient Account Number: [id number] Date of Birth/Sex: Treating RN: 1975/09/15 (47 y.o. 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

## 2022-06-21 ENCOUNTER — Ambulatory Visit: Payer: Medicare PPO | Admitting: Psychology

## 2022-06-21 ENCOUNTER — Ambulatory Visit: Payer: Medicare HMO | Admitting: Physician Assistant

## 2022-07-05 ENCOUNTER — Ambulatory Visit (INDEPENDENT_AMBULATORY_CARE_PROVIDER_SITE_OTHER): Payer: Medicare HMO | Admitting: Psychology

## 2022-07-05 DIAGNOSIS — F331 Major depressive disorder, recurrent, moderate: Secondary | ICD-10-CM | POA: Diagnosis not present

## 2022-07-05 NOTE — Progress Notes (Signed)
[name] is a 47 y.o. female patient .  Diagnosis 296.30 (Major depressive affective disorder, recurrent episode, unspecified) [n/a]  Symptoms Depressed or irritable mood. (Status: maintained) -- No Description Entered  Diminished interest in or enjoyment of activities. (Status: maintained) -- No Description Entered  Feelings of hopelessness, worthlessness, or inappropriate guilt. (Status: maintained) -- No Description Entered  Lack of energy. (Status: maintained) -- No Description Entered  Unresolved grief issues. (Status: maintained) -- No Description Entered  Medication Status compliance  Safety none  If Suicidal or Homicidal State Action Taken: unspecified  Current Risk: low Medications Hydroxyzine (Dosage: unknown)  Remiron (Dosage: unknown)  Sertraline (Dosage: unknown)  Objectives Related Problem: Appropriately grieve the loss in order to normalize mood and to return to previously adaptive level of functioning. Description: Increasingly verbalize hopeful and positive statements regarding self, others, and the future. Target Date: 2023-01-14 Frequency: Daily Modality: individual Progress: 90%  Related Problem: Appropriately grieve the loss in order to normalize mood and to return to previously adaptive level of functioning. Description: Learn and implement conflict resolution skills to resolve interpersonal problems. Target Date: 2023-01-14 Frequency: Daily Modality: individual Progress: 85%  Related Problem: Appropriately grieve the loss in order to normalize mood and to return to previously adaptive level of functioning. Description: Learn and implement problem-solving and decision-making skills. Target Date: 2023-01-14 Frequency: Daily Modality: individual Progress: 85%  Related Problem: Appropriately grieve the loss in order to normalize mood and to return to  previously adaptive level of functioning. Description: Describe current and past experiences with depression including their impact on functioning and attempts to resolve it. Target Date: 2021-07-14 Frequency: Daily Modality: individual Progress: 100%-completed  Related Problem: Complete the process of letting go of the lost significant other. Description: Report decreased time spent each day focusing on the loss. Target Date: 2022-07-15 Frequency: Daily Modality: individual Progress: 85%  Related Problem: Complete the process of letting go of the lost significant other. Description: Decrease unrealistic thoughts, statements, and feelings of being responsible for the loss. Target Date: 2021-07-14 Frequency: Daily Modality: individual Progress: 100%-completed  Related Problem: Complete the process of letting go of the lost significant other. Description: Verbalize and resolve feelings of anger or guilt focused on self or deceased loved one that interfere with the grieving process. Target Date: 2021-07-14 Frequency: Daily Modality: individual Progress: 100%-completed  Related Problem: Complete the process of letting go of the lost significant other. Description: Begin verbalizing feelings associated with the loss. Target Date: 2023-01-14 Frequency: Daily Modality: individual Progress: 85%  Related Problem: Complete the process of letting go of the lost significant other. Description: Identify what stages of grief have been experienced in the continuum of the grieving process. Target Date: 2021-07-14 Frequency: Daily Modality: individual Progress: 100%-completed  Related Problem: Complete the process of letting go of the lost significant other. Description: Participate in a therapy that addresses issues beyond grief that have arisen as a result of the loss. Target Date: 2023-01-14 Frequency: Daily Modality: individual Progress: 85%  Related Problem: Complete the process of  letting go of the lost significant other. Description: Tell in detail the story of the current loss that is triggering symptoms. Target Date: 2021-07-14 Frequency: Daily Modality: individual Progress: 100%-completed  Client Response  full compliance  Service Location Location, [address] [place], [place] [address]  Service Code cpt [phone]  Validate/empathize  Related past to present  Behavioral activation plan  Facilitate problem solving  Identify automatic thoughts  Rationally challenge thoughts or beliefs/cognitive restructuring  Identify/label emotions  Emotion regulation skills  Psychiatrist  Self care activities  Self-monitoring  Session Notes: Patient requests a video Caregility session. She is at home and I am at my home office.  Dx: Major Depression  Meds: Lexapro (20mg ), Busperone (7.5)  Goals: States that she is seeking counseling to learn to manage symptoms of depression and anxiety. Will utilize insight oriented therapy and behavioral strategies to manage symptoms. Goal date is 12-24 Session notes: [name] says she spent time with her father yesterday for Father's Day. She said that her father feels that it is best if [name] never finds a partner so she can devote her time to taking care of her mother. [name] is still very affected by whatever her parents say, even at her own expense. She talked about the racism in her family and how she had had to fight against it. Contributes to her depleted sense of self and feeling like an "outsider" in her family. She still struggles that she is rejected by some of her husband's family and even blamed from them about Jay's death. These feelings of despair from rejection have lessened over time and she is managing better. When it does "flare up", she gets upset and sad. Discussed triggers and employing cognitive strategies to manage.                                                                              Garrel Ridgel, PhD Time: 7:45a-8:30a 45 minutes

## 2022-07-19 ENCOUNTER — Ambulatory Visit: Payer: Medicare HMO | Admitting: Psychology

## 2022-07-19 NOTE — Progress Notes (Unsigned)
[name] is a 47 y.o. female patient .  Diagnosis 296.30 (Major depressive affective disorder, recurrent episode, unspecified) [n/a]  Symptoms Depressed or irritable mood. (Status: maintained) -- No Description Entered  Diminished interest in or enjoyment of activities. (Status: maintained) -- No Description Entered  Feelings of hopelessness, worthlessness, or inappropriate guilt. (Status: maintained) -- No Description Entered  Lack of energy. (Status: maintained) -- No Description Entered  Unresolved grief issues. (Status: maintained) -- No Description Entered  Medication Status compliance  Safety none  If Suicidal or Homicidal State Action Taken: unspecified  Current Risk: low Medications Hydroxyzine (Dosage: unknown)  Remiron (Dosage: unknown)  Sertraline (Dosage: unknown)  Objectives Related Problem: Appropriately grieve the loss in order to normalize mood and to return to previously adaptive level of functioning. Description: Increasingly verbalize hopeful and positive statements regarding self, others, and the future. Target Date: 2023-01-14 Frequency: Daily Modality: individual Progress: 90%  Related Problem: Appropriately grieve the loss in order to normalize mood and to return to previously adaptive level of functioning. Description: Learn and implement conflict resolution skills to resolve interpersonal problems. Target Date: 2023-01-14 Frequency: Daily Modality: individual Progress: 85%  Related Problem: Appropriately grieve the loss in order to normalize mood and to return to previously adaptive level of functioning. Description: Learn and implement problem-solving and decision-making skills. Target Date: 2023-01-14 Frequency: Daily Modality: individual Progress: 85%  Related Problem: Appropriately grieve the loss in order to  normalize mood and to return to previously adaptive level of functioning. Description: Describe current and past experiences with depression including their impact on functioning and attempts to resolve it. Target Date: 2021-07-14 Frequency: Daily Modality: individual Progress: 100%-completed  Related Problem: Complete the process of letting go of the lost significant other. Description: Report decreased time spent each day focusing on the loss. Target Date: 2022-07-15 Frequency: Daily Modality: individual Progress: 85%  Related Problem: Complete the process of letting go of the lost significant other. Description: Decrease unrealistic thoughts, statements, and feelings of being responsible for the loss. Target Date: 2021-07-14 Frequency: Daily Modality: individual Progress: 100%-completed  Related Problem: Complete the process of letting go of the lost significant other. Description: Verbalize and resolve feelings of anger or guilt focused on self or deceased loved one that interfere with the grieving process. Target Date: 2021-07-14 Frequency: Daily Modality: individual Progress: 100%-completed  Related Problem: Complete the process of letting go of the lost significant other. Description: Begin verbalizing feelings associated with the loss. Target Date: 2023-01-14 Frequency: Daily Modality: individual Progress: 85%  Related Problem: Complete the process of letting go of the lost significant other. Description: Identify what stages of grief have been experienced in the continuum of the grieving process. Target Date: 2021-07-14 Frequency: Daily Modality: individual Progress: 100%-completed  Related Problem: Complete the process of letting go of the lost significant other. Description: Participate in a therapy that addresses issues beyond grief that have arisen as a result of the loss. Target Date: 2023-01-14 Frequency: Daily Modality: individual Progress: 85%  Related  Problem: Complete the process of letting go of the lost significant other. Description: Tell in detail the story of the current loss that  is triggering symptoms. Target Date: 2021-07-14 Frequency: Daily Modality: individual Progress: 100%-completed  Client Response full compliance  Service Location Location, [address] [place], [place] [address]  Service Code cpt [phone]  Validate/empathize  Related past to present  Behavioral activation plan  Facilitate problem solving  Identify automatic thoughts  Rationally challenge thoughts or beliefs/cognitive restructuring  Identify/label emotions  Emotion regulation skills  Psychiatrist  Self care activities  Self-monitoring  Session Notes: Patient requests a video Caregility session. She is at home and I am at my home office.  Dx: Major Depression  Meds: Lexapro (20mg ), Busperone (7.5)  Goals: States that she is seeking counseling to learn to manage symptoms of depression and anxiety. Will utilize insight oriented therapy and behavioral strategies to manage symptoms. Goal date is 12-24 Session notes: Anne Hahn, PhD Time: 7:45a-8:30a 45 minutes

## 2022-07-26 ENCOUNTER — Encounter: Payer: Medicare HMO | Attending: Physician Assistant | Admitting: Physician Assistant

## 2022-07-26 DIAGNOSIS — M86671 Other chronic osteomyelitis, right ankle and foot: Secondary | ICD-10-CM | POA: Diagnosis not present

## 2022-07-26 DIAGNOSIS — G7100 Muscular dystrophy, unspecified: Secondary | ICD-10-CM | POA: Diagnosis not present

## 2022-07-26 DIAGNOSIS — Z872 Personal history of diseases of the skin and subcutaneous tissue: Secondary | ICD-10-CM | POA: Diagnosis not present

## 2022-07-26 DIAGNOSIS — G600 Hereditary motor and sensory neuropathy: Secondary | ICD-10-CM | POA: Insufficient documentation

## 2022-07-26 DIAGNOSIS — Z09 Encounter for follow-up examination after completed treatment for conditions other than malignant neoplasm: Secondary | ICD-10-CM | POA: Insufficient documentation

## 2022-07-26 DIAGNOSIS — F17210 Nicotine dependence, cigarettes, uncomplicated: Secondary | ICD-10-CM | POA: Diagnosis not present

## 2022-07-26 DIAGNOSIS — M13872 Other specified arthritis, left ankle and foot: Secondary | ICD-10-CM | POA: Diagnosis not present

## 2022-07-26 DIAGNOSIS — L97512 Non-pressure chronic ulcer of other part of right foot with fat layer exposed: Secondary | ICD-10-CM | POA: Diagnosis not present

## 2022-07-26 DIAGNOSIS — L84 Corns and callosities: Secondary | ICD-10-CM | POA: Insufficient documentation

## 2022-07-26 DIAGNOSIS — M25572 Pain in left ankle and joints of left foot: Secondary | ICD-10-CM | POA: Diagnosis not present

## 2022-07-26 DIAGNOSIS — S82832A Other fracture of upper and lower end of left fibula, initial encounter for closed fracture: Secondary | ICD-10-CM | POA: Diagnosis not present

## 2022-07-26 DIAGNOSIS — M79672 Pain in left foot: Secondary | ICD-10-CM | POA: Diagnosis not present

## 2022-07-26 NOTE — Progress Notes (Signed)
ATHELENE, BODENSTEIN ([id number]) [id number] Page 1 of 8 Visit Report for 07/26/2022 Chief Complaint Document Details Patient Name: Date of Service: [name]. 07/26/2022 8:30 A [name]: [id number] Patient Account Number: [id number] Date of Birth/Sex: Treating RN: [birth year] (47 y.o. [name] Primary Care Provider: Marikay Alar Other Clinician: Referring Provider: Treating Provider/Extender: Harold Barban in Treatment: 10 Information Obtained from: Patient Chief Complaint Right foot ulcer Electronic Signature(s) Signed: 07/26/2022 9:33:03 AM By: Allen Derry [name] Entered By: Allen Derry on 07/26/2022 09:33:03 -------------------------------------------------------------------------------- HPI Details Patient Name: Date of Service: [name], [name]. 07/26/2022 8:30 A [name]: [id number] Patient Account Number: [id number] Date of Birth/Sex: Treating RN: 08/12/1975 (47 y.o. [name] Primary Care Provider: Marikay Alar Other Clinician: Referring Provider: Treating Provider/Extender: Harold Barban in Treatment: 10 History of Present Illness HPI Description: 47 year old patient sent to [name] by her PCP Dr. Bing Ree who saw her recently on 05/15/2015 for chronic pain related to Charcot Hilda Lias tooth disease and subsequent problems with her feet with a history of bilateral great toe amputations for osteomyelitis. Her notes were reviewed and there was extensive history of opioid treatment and management by the [facility] pain clinic from where she has been terminated. X-ray of the right foot foot was done on 03/13/2015 and was suggestive of active infection and possible osteomyelitis. her past surgical history includes amputation of the left big toe in 2011 and amputation of the right big toe in 2015. She was sitting the wound clinic at [facility] and we will try and obtain  these notes. She is a smoker and smokes about 15 cigarettes a day. 06/12/2015 - x-ray of the right foot -- IMPRESSION:1. Soft tissue swelling of the plantar surface of the foot. No underlying acute abnormality. 2. Amputation deformity again of the right great toe. Old healed bony deformity noted the right second metatarsal. No acute bony abnormality identified. We have not received any reports from the [facility] hospital yet. She continues to smoke at least 10 cigarettes a day 06/19/2015 -- she has increasing pain in the right foot and also has had some low-grade fever. Her vascular test is scheduled for tomorrow and her MRIs not till next week. Readmission: [name], [name] ([id number]) [id number] Page 2 of 8 08-20-2021 upon evaluation today patient presents for initial inspection here in our clinic although she has been seen that was back in 2017. At that time she was having a issue with her right foot. With that being said upon inspection today she is actually having issues with the right foot as well on the medial aspect where she has some breakdown due to what I am assuming is pressure to the foot location. 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 I did review her culture report which showed evidence of bacteria including hemolytic Streptococcus group A as well as Staphylococcus aureus. The patient is currently on doxycycline and tells me that this is getting much better. T opical gentamicin is also being utilized. Also did review her arterial studies which show that she has on the right and ABI of 1.15 which is triphasic and appears to be excellent there is no significant peripheral artery disease noted the left is also. With ABI of 1.11 and a good report  in that regard as well. Lastly I did also  review her MRI of the right forefoot which was performed on 07-06-2021. This shows that she does have mild subcortical marrow edema in the stump of the first metatarsal concerning for osteomyelitis. There is also a malunited fracture of the second metatarsal. The patient has bone marrow edema in the base of the second third fourth and fifth metatarsals concerning for stress reaction obviously this infection is going to be an issue for [name] here as well she needs to be treated appropriately and I do believe that the doxycycline is a good option right now although I may need to #1 extend this and #2 plan for even altering or adding to the treatment regimen depending on how things progress. Patient voiced understanding.Marland Kitchen 09-01-2021 upon evaluation today patient appears to be doing excellent in regard to her wound. She is actually showing signs of excellent improvement which is great news. Fortunately I do not see any signs of active infection locally or systemically at this point which is great news and overall I am extremely pleased with where things stand today. I do not see any evidence of active infection locally or systemically. 09-07-2021 upon evaluation today patient appears to be doing well with regard to her foot ulcer. This is actually showing signs of improvement which is great news. Fortunately there does not appear to be any signs of active infection locally or systemically at this time which is as well. 09-22-2021 upon evaluation today patient appears to be doing well currently in regard to her wound. This is measuring smaller and looking better were slowly making progress here. Fortunately there does not appear to be any evidence of active infection locally or systemically at this time. Upon inspection patient's wound bed showed evidence of good granulation and epithelization at this point. 10-05-2021 upon evaluation today patient's wound shows a lot of callus buildup but the surface of the wound  is actually doing quite well. She is going require some sharp debridement to clearway some of the necrotic debris. 10-19-2021 upon evaluation today patient appears to be doing better in regard to her wound which is actually measuring little bit smaller today. Fortunately there does not appear to be any signs of significant infection she does have quite a bit of callus we will get have to work on that today. Otherwise I think we are headed in the appropriate direction. 11-09-2021 upon evaluation today patient appears to be doing well in regard to her wound from the standpoint of infection and actually appears to be doing better she actually just completed the doxycycline. With that being said I am hopeful that this took care of the osteomyelitis although that something that we will definitely keep a close eye on. With that being said I do believe that she is still having tremendous amount of callus buildup which I believe in turn is a result of her having issues here with Friction and callus buildup. With that being said I do believe that the patient would benefit here from a total contact cast I discussed that with her today. I do want to get more aggressive to get this closed as I think that if we do not then she is more likely to have a long standing issue here going forward. 11-17-2021 upon evaluation today patient appears to be doing well although her wound is still open. I do think that she is ready to be placed in a total contact cast. She actually has come  back on Thursday to have this changed out. Fortunately and overall I do not see any signs of worsening and I think that we should be able to get this on without any complication or problem.. 11-19-2021 upon evaluation today patient appears to be doing well currently in regard to her wound. She has been tolerating the dressing changes without complication and overall I am extremely pleased with where we stand today. I do not see any evidence of  active infection locally or systemically at this time which is great news. I think the total contact cast did an awesome job for her. 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 Only a very small area remains on the plantar foot. I had to use illumination to even see anything that looked open here. Much improved 12-03-2021 upon evaluation today patient appears to be doing well currently in regard to her wound. Of note she tells me that she did end up cutting off her cast with a pair of kitchen shears 2 nights ago. She states that she has been walking on it more than she should have after the cast was put on due to a friend who ended up in the hospital that it caught her right after she had a put on that same day. She feels like she probably deformed the cast that it was causing some pressure points that she was afraid to get a blister and did not want another wound. For that reason she did go ahead and cut the soft and does not appear to have caused any damage. She tells me that it only took 7 minutes. With that being said I do think the wound actually looks really good she has a lot of callus buildup I am going to remove that just to make sure nothing side underneath but other than that overall she seems to be doing quite well. 12-07-2021 upon evaluation today patient's wound is actually showing signs of excellent improvement and very pleased with where we stand I think we are headed in the right direction. Fortunately there does not appear to be any signs of active infection at this time which is great news. No fevers, chills, nausea, vomiting, or diarrhea. 01-14-2022 upon evaluation today patient appears to be doing well currently in regard to her wound she does have a bit of callus buildup unfortunately but otherwise does not seem to show any signs of active infection locally or systemically at this time. Fortunately I do not see  any evidence of infection locally or systemically which is great news. She does have a lot of callus buildup she has been very sick so has not been in to be seen recently. With that being said I do not think her wound is a whole lot worse than what it was before but it is also a lot more callused over so it is difficult to tell initially. 01-19-2022 upon evaluation today patient appears to be doing poorly unfortunately in regard to her wound. She has been tolerating the dressing changes without complication prior but unfortunately in the past week she has developed issues with redness and erythema on her foot with a new wound on the top of her foot which was not there last time I saw her. Unfortunately this seems to be getting significantly worse she is also had temperatures ranging up to 103 although it was 99.9 here in the office today it was not nearly that bad. Fortunately I do  not see any signs obvious of sepsis but nonetheless I am concerned about the significance of this infection I think she does need to probably get into the ER ASAP in order to be evaluated and treated appropriately based on what we are seeing. The patient is very worried about the situation here. With that being said she tells me that she has been wearing even prior to coming in today. 01-26-2022 upon evaluation today patient actually appears to be doing significantly better despite the fact she did not go to the ER last week she tells me she had a lot going on with family and otherwise just was not able to make it. Fortunately there does not appear to be any signs of infection locally or systemically at this time. 02-16-2022 upon evaluation today patient appears to be doing well the wound on the top of her foot is looking smaller the wound on the bottom/medial portion of her foot is going require some debridement today to clearway some of the necrotic debris but overall looks to be doing quite well. I do not see any evidence  of infection locally nor systemically at this time which is great news. 03-02-2022 upon evaluation today patient appears to be doing poorly in regard to her foot. It actually appears that she is probably infected based on what I am seeing and I think that she is going require some intervention in this regard. I think that we will probably put her back on doxycycline and also believe that she really needs an MRI repeated it has not been done since June antibiotics even then she showed some signs of osteomyelitis I feel like this may be getting worse. She still has not gone to the hospital as previously recommended although the infection did seem to clear with the doxycycline which is why I am going to go ahead and redo that she also states that she is not to be able to go to the hospital today. 03-09-2022 upon evaluation today patient appears to be doing well with regard to her foot unfortunately she is not doing nearly as well in regard to her leg which is showing signs of swelling. She tells me she has been having issues with her knee she wants to see orthopedics she has not gone anywhere as of yet I do think she should go to [facility] for further evaluation and treatment. She voiced understanding. With that being said again her foot is doing well mainly because she has been off of her leg due to the pain in her knee and I think this has been one of the primary issues here at this point. [name], [name] ([id number]) [id number] Page 3 of 8 3/5; the patient's wound today has healed but as noted she has not seen orthopedics with regard to swelling. After her recent knee surgery. Readmission: 05-13-2022 patient presents today for reevaluation here in the clinic concerning issues she has been having with her toe on the right foot. This is something that we were able to get her in for reevaluation with fairly quickly which was great news. Fortunately I do not see any signs of  active infection locally nor systemically which is excellent at this point. Nonetheless she had an abscess in her right knee that was February 24. Fortunately that seems to been doing better although she has not been taking the antibiotics exactly how she should have. I do believe that with this toe it could be in someway related as far as infection  is concerned but this is kind of a different situation as well. Although I think it is infected we will get a wound culture today once I get this debrided away right now it is mainly a blister and that we need to open before would not be able to determine what the next best course of treatment is. She does have doxycycline left that I previously prescribed her. 05-31-2022 upon evaluation today patient appears to be doing well currently in regard to her toe which actually looks much better. Unfortunately her foot is actually open up again which is not good. This is something that she has previously dealt with and she does have some callus here we need to clearway but I think there might actually be a wound up underneath as well. 05-31-2022 upon evaluation today patient appears to be doing well in regard to her toe this might even be healed although regular continue to monitor distal a bit longer. Nonetheless in regard to the plantar aspect of her foot this did require some sharp debridement to clearway the necrotic debris and callus and there was an open wound underneath this is probably why it has been bothering her a bit. There is no signs of infection which is good we are going to work on getting this healed. 06-07-2022 upon evaluation today patient appears to be doing well currently in regard to her wounds and everything actually appears to be healed. Fortunately I do not see any signs of active infection at this time. No fevers, chills, nausea, vomiting, or diarrhea. 07-27-2022 upon evaluation today patient appears to be doing well. I do believe that we are  making good headway towards keeping this closed. I do not see any signs of an open wound at this point. Electronic Signature(s) Signed: 07/27/2022 6:54:10 PM By: Allen Derry [name] Entered By: Allen Derry on 07/27/2022 18:54:10 -------------------------------------------------------------------------------- Physical Exam Details Patient Name: Date of Service: [name]. 07/26/2022 8:30 A [name]: [id number] Patient Account Number: [id number] Date of Birth/Sex: Treating RN: 22-Feb-1975 (47 y.o. [name] Primary Care Provider: Marikay Alar Other Clinician: Referring Provider: Treating Provider/Extender: Harold Barban in Treatment: 10 Constitutional Well-nourished and well-hydrated in no acute distress. Respiratory normal breathing without difficulty. Psychiatric this patient is able to make decisions and demonstrates good insight into disease process. Alert and Oriented x 3. pleasant and cooperative. Notes Upon inspection patient's wound bed actually showed signs of good granulation and epithelization at this point. Fortunately I do not see any signs of active infection at this time nor really anything truly open at this time which is great news and I am very pleased in that regard. Electronic Signature(s) Signed: 07/27/2022 6:54:23 PM By: Allen Derry [name] Entered By: Allen Derry on 07/27/2022 18:54:23 Schlechter, [name] [id number] Page 4 of 8 -------------------------------------------------------------------------------- Physician Orders Details Patient Name: Date of Service: [name]. 07/26/2022 8:30 A [name]: [id number] Patient Account Number: [id number] Date of Birth/Sex: Treating RN: 06-Aug-1975 (47 y.o. [name] Primary Care Provider: Marikay Alar Other Clinician: Referring Provider: Treating Provider/Extender: Harold Barban in  Treatment: 10 Verbal / Phone Orders: No Diagnosis Coding Discharge From [facility] 6 month Preventative Visit - Wounds are still healed Electronic Signature(s) Signed: 07/26/2022 5:05:34 PM By: Allen Derry [name] Signed: 07/26/2022 5:27:27 PM By: Midge Aver MSN RN CNS WTA Entered By: Midge Aver on 07/26/2022 09:28:58 -------------------------------------------------------------------------------- Problem List Details Patient Name: Date  of Service: [name], [place]. 07/26/2022 8:30 A [name]: [id number] Patient Account Number: [id number] Date of Birth/Sex: Treating RN: [birth year] (47 y.o. 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 07/26/2022 8:30 A [name]: [id number] Patient Account Number: [id number] Date of Birth/Sex: Treating RN: [birth year] (47 y.o. [name] Primary Care Provider: Marikay Alar Other Clinician: Referring Provider: Treating Provider/Extender: Harold Barban in Treatment:  10 Subjective Chief Complaint Information obtained from Patient Right foot ulcer History of Present Illness (HPI) 47 year old patient sent to [name] by her PCP Dr. Bing Ree who saw her recently on 05/15/2015 for chronic pain related to Charcot Hilda Lias tooth disease and subsequent problems with her feet with a history of bilateral great toe amputations for osteomyelitis. Her notes were reviewed and there was extensive history of opioid treatment and management by the [facility] pain clinic from where she has been terminated. X-ray of the right foot foot was done on 03/13/2015 and was suggestive of active infection and possible osteomyelitis. her past surgical history includes amputation of the left big toe in 2011 and amputation of the right big toe in 2015. She was sitting the wound clinic at [facility] and we will try and obtain these notes. She is a smoker and smokes about 15 cigarettes a day. 06/12/2015 - x-ray of the right foot -- IMPRESSION:1. Soft tissue swelling of the plantar surface of the foot. No underlying acute abnormality. 2. Amputation deformity again of the right great toe. Old healed bony deformity noted the right second metatarsal. No acute bony abnormality identified. We have not received any reports from the [facility] hospital yet. She continues to smoke at least 10 cigarettes a day 06/19/2015 -- she has increasing pain in the right foot and also has had some low-grade fever. Her vascular test is scheduled for tomorrow and her MRIs not till next week. Readmission: 08-20-2021 upon evaluation today patient presents for initial inspection here in our clinic although she has been seen that was back in 2017. At that time she was having a issue with her right foot. With that being said upon inspection today she is actually having issues with the right foot as well on the medial aspect where she has some breakdown due to what I am assuming is pressure to the foot location. She does have  Charcot-Marie-T ooth disease she also has a history of muscular dystrophy, callus buildup, and osteomyelitis of the ankle and foot which has been demonstrated at this point as well to be present currently. I did review her culture report which showed evidence of bacteria including hemolytic Streptococcus group A as well as Staphylococcus aureus. The patient is currently on doxycycline and tells me that this is getting much better. T opical gentamicin is also being  utilized. Also did review her arterial studies which show that she has on the right and ABI of 1.15 which is triphasic and appears to be excellent there is no significant peripheral artery disease noted the left is also. With ABI of 1.11 and a good report in that regard as well. Lastly I did also review her MRI of the right forefoot which was performed on 07-06-2021. This shows that she does have mild subcortical marrow edema in the stump of the first metatarsal concerning for osteomyelitis. There is also a malunited fracture of the second metatarsal. The patient has bone marrow edema in the base of the second third fourth and fifth metatarsals concerning for stress reaction obviously this infection is going to be an issue for [name] here as well she needs to be treated appropriately and I do believe that the doxycycline is a good option right now although I may need to #1 extend this and #2 plan for even altering or adding to the treatment regimen depending on how things progress. Patient voiced understanding.Marland Kitchen 09-01-2021 upon evaluation today patient appears to be doing excellent in regard to her wound. She is actually showing signs of excellent improvement which is great news. Fortunately I do not see any signs of active infection locally or systemically at this point which is great news and overall I am extremely pleased with where things stand today. I do not see any evidence of active infection locally or systemically. 09-07-2021 upon  evaluation today patient appears to be doing well with regard to her foot ulcer. This is actually showing signs of improvement which is great [name], [name] ([id number]) [id number] Page 6 of 8 news. Fortunately there does not appear to be any signs of active infection locally or systemically at this time which is as well. 09-22-2021 upon evaluation today patient appears to be doing well currently in regard to her wound. This is measuring smaller and looking better were slowly making progress here. Fortunately there does not appear to be any evidence of active infection locally or systemically at this time. Upon inspection patient's wound bed showed evidence of good granulation and epithelization at this point. 10-05-2021 upon evaluation today patient's wound shows a lot of callus buildup but the surface of the wound is actually doing quite well. She is going require some sharp debridement to clearway some of the necrotic debris. 10-19-2021 upon evaluation today patient appears to be doing better in regard to her wound which is actually measuring little bit smaller today. Fortunately there does not appear to be any signs of significant infection she does have quite a bit of callus we will get have to work on that today. Otherwise I think we are headed in the appropriate direction. 11-09-2021 upon evaluation today patient appears to be doing well in regard to her wound from the standpoint of infection and actually appears to be doing better she actually just completed the doxycycline. With that being said I am hopeful that this took care of the osteomyelitis although that something that we will definitely keep a close eye on. With that being said I do believe that she is still having tremendous amount of callus buildup which I believe in turn is a result of her having issues here with Friction and callus buildup. With that being said I do believe that the patient would benefit  here from a total contact cast I discussed that with her today. I do want to get more aggressive to get this closed as  I think that if we do not then she is more likely to have a long standing issue here going forward. 11-17-2021 upon evaluation today patient appears to be doing well although her wound is still open. I do think that she is ready to be placed in a total contact cast. She actually has come back on Thursday to have this changed out. Fortunately and overall I do not see any signs of worsening and I think that we should be able to get this on without any complication or problem.. 11-19-2021 upon evaluation today patient appears to be doing well currently in regard to her wound. She has been tolerating the dressing changes without complication and overall I am extremely pleased with where we stand today. I do not see any evidence of active infection locally or systemically at this time which is great news. I think the total contact cast did an awesome job for her. 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 Only a very small area remains on the plantar foot. I had to use illumination to even see anything that looked open here. Much improved 12-03-2021 upon evaluation today patient appears to be doing well currently in regard to her wound. Of note she tells me that she did end up cutting off her cast with a pair of kitchen shears 2 nights ago. She states that she has been walking on it more than she should have after the cast was put on due to a friend who ended up in the hospital that it caught her right after she had a put on that same day. She feels like she probably deformed the cast that it was causing some pressure points that she was afraid to get a blister and did not want another wound. For that reason she did go ahead and cut the soft and does not appear to have caused any damage. She tells me that it only took 7  minutes. With that being said I do think the wound actually looks really good she has a lot of callus buildup I am going to remove that just to make sure nothing side underneath but other than that overall she seems to be doing quite well. 12-07-2021 upon evaluation today patient's wound is actually showing signs of excellent improvement and very pleased with where we stand I think we are headed in the right direction. Fortunately there does not appear to be any signs of active infection at this time which is great news. No fevers, chills, nausea, vomiting, or diarrhea. 01-14-2022 upon evaluation today patient appears to be doing well currently in regard to her wound she does have a bit of callus buildup unfortunately but otherwise does not seem to show any signs of active infection locally or systemically at this time. Fortunately I do not see any evidence of infection locally or systemically which is great news. She does have a lot of callus buildup she has been very sick so has not been in to be seen recently. With that being said I do not think her wound is a whole lot worse than what it was before but it is also a lot more callused over so it is difficult to tell initially. 01-19-2022 upon evaluation today patient appears to be doing poorly unfortunately in regard to her wound. She has been tolerating the dressing changes without complication prior but unfortunately in the past week she has developed issues with redness and erythema on her  foot with a new wound on the top of her foot which was not there last time I saw her. Unfortunately this seems to be getting significantly worse she is also had temperatures ranging up to 103 although it was 99.9 here in the office today it was not nearly that bad. Fortunately I do not see any signs obvious of sepsis but nonetheless I am concerned about the significance of this infection I think she does need to probably get into the ER ASAP in order to be  evaluated and treated appropriately based on what we are seeing. The patient is very worried about the situation here. With that being said she tells me that she has been wearing even prior to coming in today. 01-26-2022 upon evaluation today patient actually appears to be doing significantly better despite the fact she did not go to the ER last week she tells me she had a lot going on with family and otherwise just was not able to make it. Fortunately there does not appear to be any signs of infection locally or systemically at this time. 02-16-2022 upon evaluation today patient appears to be doing well the wound on the top of her foot is looking smaller the wound on the bottom/medial portion of her foot is going require some debridement today to clearway some of the necrotic debris but overall looks to be doing quite well. I do not see any evidence of infection locally nor systemically at this time which is great news. 03-02-2022 upon evaluation today patient appears to be doing poorly in regard to her foot. It actually appears that she is probably infected based on what I am seeing and I think that she is going require some intervention in this regard. I think that we will probably put her back on doxycycline and also believe that she really needs an MRI repeated it has not been done since June antibiotics even then she showed some signs of osteomyelitis I feel like this may be getting worse. She still has not gone to the hospital as previously recommended although the infection did seem to clear with the doxycycline which is why I am going to go ahead and redo that she also states that she is not to be able to go to the hospital today. 03-09-2022 upon evaluation today patient appears to be doing well with regard to her foot unfortunately she is not doing nearly as well in regard to her leg which is showing signs of swelling. She tells me she has been having issues with her knee she wants to see  orthopedics she has not gone anywhere as of yet I do think she should go to [facility] for further evaluation and treatment. She voiced understanding. With that being said again her foot is doing well mainly because she has been off of her leg due to the pain in her knee and I think this has been one of the primary issues here at this point. 3/5; the patient's wound today has healed but as noted she has not seen orthopedics with regard to swelling. After her recent knee surgery. Readmission: 05-13-2022 patient presents today for reevaluation here in the clinic concerning issues she has been having with her toe on the right foot. This is something that we were able to get her in for reevaluation with fairly quickly which was great news. Fortunately I do not see any signs of active infection locally nor systemically which is excellent at this point. Nonetheless she had an  abscess in her right knee that was February 24. Fortunately that seems to been doing better although she has not been taking the antibiotics exactly how she should have. I do believe that with this toe it could be in someway related as far as infection is concerned but this is kind of a different situation as well. Although I think it is infected we will get a wound culture today once I get this debrided away right now it is mainly a blister and that we need to open before would not be able to determine what the next best course of treatment is. She does have doxycycline left that I previously prescribed her. 05-31-2022 upon evaluation today patient appears to be doing well currently in regard to her toe which actually looks much better. Unfortunately her foot is actually open up again which is not good. This is something that she has previously dealt with and she does have some callus here we need to clearway but I think there might actually be a wound up underneath as well. 05-31-2022 upon evaluation today patient appears to be doing  well in regard to her toe this might even be healed although regular continue to monitor distal a bit longer. Nonetheless in regard to the plantar aspect of her foot this did require some sharp debridement to clearway the necrotic debris and callus and there was an open wound underneath this is probably why it has been bothering her a bit. There is no signs of infection which is good we are going to work on getting this [name], [name] ([id number]) [id number] Page 7 of 8 healed. 06-07-2022 upon evaluation today patient appears to be doing well currently in regard to her wounds and everything actually appears to be healed. Fortunately I do not see any signs of active infection at this time. No fevers, chills, nausea, vomiting, or diarrhea. 07-27-2022 upon evaluation today patient appears to be doing well. I do believe that we are making good headway towards keeping this closed. I do not see any signs of an open wound at this point. Objective Constitutional Well-nourished and well-hydrated in no acute distress. Vitals Time Taken: 8:52 AM, Height: 67 in, Weight: 153 lbs, BMI: 24, Temperature: 98.9 F, Pulse: 85 bpm, Respiratory Rate: 16 breaths/min, Blood Pressure: 102/53 mmHg. Respiratory normal breathing without difficulty. Psychiatric this patient is able to make decisions and demonstrates good insight into disease process. Alert and Oriented x 3. pleasant and cooperative. General Notes: Upon inspection patient's wound bed actually showed signs of good granulation and epithelization at this point. Fortunately I do not see any signs of active infection at this time nor really anything truly open at this time which is great news and I am very pleased in that regard. 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 I would recommend that we have the patient continue to monitor for any signs of infection or worsening overall and obviously if anything changes she knows contact the office let me know otherwise at this point my plan is to discharge her if she is still remaining healed. We will have a 68-month preventative follow-up visit just due to the fact that she is at multiple reopen needs and issues. If anything changes in the interim  she knows contact the office and let me know. Electronic Signature(s) Signed: 07/27/2022 6:55:00 PM By: Allen Derry [name] Entered By: Allen Derry on 07/27/2022 18:54:59 [name], [name] ([id number]) [id number] Page 8 of 8 -------------------------------------------------------------------------------- SuperBill Details Patient Name: Date of Service: [name], [place]. 07/26/2022 Medical Record Number: [id number] Patient Account Number: [id number] Date of Birth/Sex: Treating RN: 18-Feb-1975 (47 y.o. [name] Primary Care Provider: Marikay Alar Other Clinician: Referring Provider: Treating Provider/Extender: Harold Barban in Treatment: 10 Diagnosis Coding ICD-10 Codes Code Description [phone] Other chronic osteomyelitis, right ankle and foot L97.512 Non-pressure chronic ulcer of other part of right foot with fat layer exposed G71.00 Muscular dystrophy, unspecified G60.0 Hereditary motor and sensory neuropathy L84 Corns and callosities Facility Procedures : CPT4 Code: [id number] Description: 99213 - WOUND CARE VISIT-LEV 3 EST PT Modifier: Quantity: 1 Physician Procedures : CPT4 Code Description Modifier [id number] - WC PHYS LEVEL 3 - EST PT ICD-10 Diagnosis Description M86.671 Other chronic osteomyelitis, right ankle and foot L97.512 Non-pressure chronic ulcer of  other part of right foot with fat layer exposed G71.00  Muscular dystrophy, unspecified G60.0 Hereditary motor and sensory neuropathy Quantity: 1 Electronic Signature(s) Signed: 07/27/2022 6:56:24 PM By: Allen Derry [name] Previous Signature: 07/26/2022 5:05:34 PM Version By: Allen Derry [name] Previous Signature: 07/26/2022 5:27:27 PM Version By: Midge Aver MSN RN CNS WTA Entered By: Allen Derry on 07/27/2022 18:56:23

## 2022-07-26 NOTE — Progress Notes (Signed)
ADREONA, DEVARY ([id number]) [id number] Page 1 of 6 Visit Report for 07/26/2022 Arrival Information Details Patient Name: Date of Service: Kelly [name], [place]. 07/26/2022 8:30 A [name]: [id number] Patient Account Number: [id number] Date of Birth/Sex: Treating RN: 11-21-1975 (47 y.o. Ginette Pitman Primary Care Man Bonneau: Marikay Alar Other Clinician: Referring Loanne Emery: Treating Callen Vancuren/Extender: Harold Barban in Treatment: 10 Visit Information History Since Last Visit Added or deleted any medications: No Patient Arrived: Ambulatory Any new allergies or adverse reactions: No Arrival Time: 08:49 Has Dressing in Place as Prescribed: Yes Accompanied By: self Pain Present Now: No Transfer Assistance: None Patient Identification Verified: Yes Secondary Verification Process Completed: Yes Patient Requires Transmission-Based Precautions: No Patient Has Alerts: Yes Patient Alerts: Patient on Blood Thinner NOT DIABETIC 325 Aspirin Electronic Signature(s) Signed: 07/26/2022 5:27:27 PM By: Midge Aver MSN RN CNS WTA Entered By: Midge Aver on 07/26/2022 08:52:24 -------------------------------------------------------------------------------- Clinic Level of Care Assessment Details Patient Name: Date of Service: Kelly [name], Kelly Mad. 07/26/2022 8:30 A [name]: [id number] Patient Account Number: [id number] Date of Birth/Sex: Treating RN: 1975-07-06 (47 y.o. Ginette Pitman Primary Care Deira Shimer: Marikay Alar Other Clinician: Referring Gerrick Ray: Treating Zinia Innocent/Extender: Harold Barban in Treatment: 10 Clinic Level of Care Assessment Items TOOL 4 Quantity Score X- 1 0 Use when only an EandM is performed on FOLLOW-UP visit ASSESSMENTS - Nursing Assessment / Reassessment X- 1 10 Reassessment of Co-morbidities (includes updates in patient status) X- 1 5 Reassessment of Adherence to  Treatment Plan ASSESSMENTS - Wound and Skin A ssessment / Reassessment []  - 0 Simple Wound Assessment / Reassessment - one wound Kelly [name] ([id number]) [id number] Page 2 of 6 X- 6 5 Complex Wound Assessment / Reassessment - multiple wounds []  - 0 Dermatologic / Skin Assessment (not related to wound area) ASSESSMENTS - Focused Assessment []  - 0 Circumferential Edema Measurements - multi extremities []  - 0 Nutritional Assessment / Counseling / Intervention []  - 0 Lower Extremity Assessment (monofilament, tuning fork, pulses) []  - 0 Peripheral Arterial Disease Assessment (using hand held doppler) ASSESSMENTS - Ostomy and/or Continence Assessment and Care []  - 0 Incontinence Assessment and Management []  - 0 Ostomy Care Assessment and Management (repouching, etc.) PROCESS - Coordination of Care X - Simple Patient / Family Education for ongoing care 1 15 []  - 0 Complex (extensive) Patient / Family Education for ongoing care X- 1 10 Staff obtains Chiropractor, Records, T Results / Process Orders est []  - 0 Staff telephones HHA, Nursing Homes / Clarify orders / etc []  - 0 Routine Transfer to another Facility (non-emergent condition) []  - 0 Routine Hospital Admission (non-emergent condition) []  - 0 New Admissions / Manufacturing engineer / Ordering NPWT Apligraf, etc. , []  - 0 Emergency Hospital Admission (emergent condition) []  - 0 Simple Discharge Coordination X- 1 15 Complex (extensive) Discharge Coordination PROCESS - Special Needs []  - 0 Pediatric / Minor Patient Management []  - 0 Isolation Patient Management []  - 0 Hearing / Language / Visual special needs []  - 0 Assessment of Community assistance (transportation, D/C planning, etc.) []  - 0 Additional assistance / Altered mentation []  - 0 Support Surface(s) Assessment (bed, cushion, seat, etc.) INTERVENTIONS - Wound Cleansing / Measurement []  - 0 Simple Wound Cleansing - one  wound []  - 0 Complex Wound Cleansing - multiple wounds []  - 0 Wound Imaging (photographs - any number of wounds) []  - 0 Wound Tracing (instead of photographs) []  - 0 Simple  Wound Measurement - one wound []  - 0 Complex Wound Measurement - multiple wounds INTERVENTIONS - Wound Dressings []  - 0 Small Wound Dressing one or multiple wounds []  - 0 Medium Wound Dressing one or multiple wounds []  - 0 Large Wound Dressing one or multiple wounds []  - 0 Application of Medications - topical []  - 0 Application of Medications - injection INTERVENTIONS - Miscellaneous []  - 0 External ear exam []  - 0 Specimen Collection (cultures, biopsies, blood, body fluids, etc.) Kelly [name] ([id number]) [id number] Page 3 of 6 []  - 0 Specimen(s) / Culture(s) sent or taken to Lab for analysis []  - 0 Patient Transfer (multiple staff / Michiel Sites Lift / Similar devices) []  - 0 Simple Staple / Suture removal (25 or less) []  - 0 Complex Staple / Suture removal (26 or more) []  - 0 Hypo / Hyperglycemic Management (close monitor of Blood Glucose) []  - 0 Ankle / Brachial Index (ABI) - do not check if billed separately X- 1 5 Vital Signs Has the patient been seen at the hospital within the last three years: Yes Total Score: 90 Level Of Care: New/Established - Level 3 Electronic Signature(s) Signed: 07/26/2022 5:27:27 PM By: Midge Aver MSN RN CNS WTA Entered By: Midge Aver on 07/26/2022 09:29:45 -------------------------------------------------------------------------------- Encounter Discharge Information Details Patient Name: Date of Service: Kelly [name], Kelly [name]. 07/26/2022 8:30 A [name]: [id number] Patient Account Number: [id number] Date of Birth/Sex: Treating RN: [birth year] (47 y.o. Ginette Pitman Primary Care Young Mulvey: Marikay Alar Other Clinician: Referring Bronson Bressman: Treating Lavaeh Bau/Extender: Harold Barban in Treatment:  10 Encounter Discharge Information Items Discharge Condition: Stable Ambulatory Status: Ambulatory Discharge Destination: Home Transportation: Private Auto Accompanied By: self Schedule Follow-up Appointment: Yes Clinical Summary of Care: Electronic Signature(s) Signed: 07/26/2022 5:27:27 PM By: Midge Aver MSN RN CNS WTA Entered By: Midge Aver on 07/26/2022 09:31:10 -------------------------------------------------------------------------------- Lower Extremity Assessment Details Patient Name: Date of Service: Kelly [name], Kelly Mad. 07/26/2022 8:30 A [name]: [id number] Patient Account Number: [id number] Date of Birth/Sex: Treating RN: 01-Dec-1975 (47 y.o. [place], [place], [place] Judie Petit ([id number]) [id number] Page 4 of 6 Primary Care Timothy Trudell: Marikay Alar Other Clinician: Referring Vineeth Fell: Treating Marcele Kosta/Extender: Harold Barban in Treatment: 10 Electronic Signature(s) Signed: 07/26/2022 5:27:27 PM By: Midge Aver MSN RN CNS WTA Entered By: Midge Aver on 07/26/2022 09:27:59 -------------------------------------------------------------------------------- Multi Wound Chart Details Patient Name: Date of Service: Kelly [name], Kelly [name]. 07/26/2022 8:30 A [name]: [id number] Patient Account Number: [id number] Date of Birth/Sex: Treating RN: 11-28-1975 (47 y.o. Ginette Pitman Primary Care Levoy Geisen: Marikay Alar Other Clinician: Referring Miley Lindon: Treating Jacquel Mccamish/Extender: Harold Barban in Treatment: 10 Vital Signs Height(in): 67 Pulse(bpm): 85 Weight(lbs): 153 Blood Pressure(mmHg): 102/53 Body Mass Index(BMI): 24 Temperature(F): 98.9 Respiratory Rate(breaths/min): 16 [Treatment Notes:Wound Assessments Treatment Notes] Electronic Signature(s) Signed: 07/26/2022 5:27:27 PM By: Midge Aver MSN RN CNS WTA Entered By: Midge Aver on 07/26/2022  09:28:03 -------------------------------------------------------------------------------- Multi-Disciplinary Care Plan Details Patient Name: Date of Service: Kelly [name], Kelly Mad. 07/26/2022 8:30 A [name]: [id number] Patient Account Number: [id number] Date of Birth/Sex: Treating RN: 06/20/1975 (47 y.o. Ginette Pitman Primary Care Stetson Pelaez: Marikay Alar Other Clinician: Referring Martise Waddell: Treating Kathleene Bergemann/Extender: Harold Barban in Treatment: [address] Inactive [place], Mosetta Putt ([id number]) [id number] Page 5 of 6 Electronic Signature(s) Signed: 07/26/2022 5:27:27 PM By: Midge Aver MSN RN CNS WTA Entered By: Midge Aver on 07/26/2022 09:29:59 --------------------------------------------------------------------------------  Pain Assessment Details Patient Name: Date of Service: Kelly [name]. 07/26/2022 8:30 A [name]: [id number] Patient Account Number: [id number] Date of Birth/Sex: Treating RN: 05-02-1975 (47 y.o. Ginette Pitman Primary Care [place] Lesure: Marikay Alar Other Clinician: Referring Jasmia Angst: Treating Winnona Wargo/Extender: Harold Barban in Treatment: 10 Active Problems Location of Pain Severity and Description of Pain Patient Has Paino No Site Locations Pain Management and Medication Current Pain Management: Electronic Signature(s) Signed: 07/26/2022 5:27:27 PM By: Midge Aver MSN RN CNS WTA Entered By: Midge Aver on 07/26/2022 08:54:54 -------------------------------------------------------------------------------- Patient/Caregiver Education Details Patient Name: Date of Service: Kelly [name], Kelly [name]. 7/8/2024andnbsp8:30 A [name] [place], Aundraya [name] ([id number]) [id number] Page 6 of 6 Medical Record Number: [id number] Patient Account Number: [id number] Date of Birth/Gender: Treating RN: [birth year] (47 y.o. Ginette Pitman Primary Care  Physician: Marikay Alar Other Clinician: Referring Physician: Treating Physician/Extender: Harold Barban in Treatment: 10 Education Assessment Education Provided To: Patient Education Topics Provided Electronic Signature(s) Signed: 07/26/2022 5:27:27 PM By: Midge Aver MSN RN CNS WTA Entered By: Midge Aver on 07/26/2022 09:30:13 -------------------------------------------------------------------------------- Vitals Details Patient Name: Date of Service: Kelly [name], Kelly [name]. 07/26/2022 8:30 A [name]: [id number] Patient Account Number: [id number] Date of Birth/Sex: Treating RN: August 27, 1975 (47 y.o. Ginette Pitman Primary Care Leeandre Nordling: Marikay Alar Other Clinician: Referring Aidon Klemens: Treating Blenda Wisecup/Extender: Harold Barban in Treatment: 10 Vital Signs Time Taken: 08:52 Temperature (F): 98.9 Height (in): 67 Pulse (bpm): 85 Weight (lbs): 153 Respiratory Rate (breaths/min): 16 Body Mass Index (BMI): 24 Blood Pressure (mmHg): 102/53 Reference Range: 80 - 120 mg / dl Electronic Signature(s) Signed: 07/26/2022 5:27:27 PM By: Midge Aver MSN RN CNS WTA Entered By: Midge Aver on 07/26/2022 08:54:47

## 2022-07-29 DIAGNOSIS — H524 Presbyopia: Secondary | ICD-10-CM | POA: Diagnosis not present

## 2022-08-02 ENCOUNTER — Ambulatory Visit: Payer: Medicare HMO | Admitting: Psychology

## 2022-08-02 DIAGNOSIS — F331 Major depressive disorder, recurrent, moderate: Secondary | ICD-10-CM

## 2022-08-02 NOTE — Progress Notes (Signed)
Alvilda Jniya Madara is a 47 y.o. female patient .  Diagnosis 296.30 (Major depressive affective disorder, recurrent episode, unspecified) [n/a]  Symptoms Depressed or irritable mood. (Status: maintained) -- No Description Entered  Diminished interest in or enjoyment of activities. (Status: maintained) -- No Description Entered  Feelings of hopelessness, worthlessness, or inappropriate guilt. (Status: maintained) -- No Description Entered  Lack of energy. (Status: maintained) -- No Description Entered  Unresolved grief issues. (Status: maintained) -- No Description Entered  Medication Status compliance  Safety none  If Suicidal or Homicidal State Action Taken: unspecified  Current Risk: low Medications Hydroxyzine (Dosage: unknown)  Remiron (Dosage: unknown)  Sertraline (Dosage: unknown)  Objectives Related Problem: Appropriately grieve the loss in order to normalize mood and to return to previously adaptive level of functioning. Description: Increasingly verbalize hopeful and positive statements regarding self, others, and the future. Target Date: 2023-01-14 Frequency: Daily Modality: individual Progress: 90%  Related Problem: Appropriately grieve the loss in order to normalize mood and to return to previously adaptive level of functioning. Description: Learn and implement conflict resolution skills to resolve interpersonal problems. Target Date: 2023-01-14 Frequency: Daily Modality: individual Progress: 85%  Related Problem: Appropriately grieve the loss in order to normalize mood and to return to previously adaptive level of functioning. Description: Learn and implement problem-solving and decision-making skills. Target Date: 2023-01-14 Frequency: Daily Modality: individual Progress: 85%  Related Problem: Appropriately  grieve the loss in order to normalize mood and to return to previously adaptive level of functioning. Description: Describe current and past experiences with depression including their impact on functioning and attempts to resolve it. Target Date: 2021-07-14 Frequency: Daily Modality: individual Progress: 100%-completed  Related Problem: Complete the process of letting go of the lost significant other. Description: Report decreased time spent each day focusing on the loss. Target Date: 2022-07-15 Frequency: Daily Modality: individual Progress: 85%  Related Problem: Complete the process of letting go of the lost significant other. Description: Decrease unrealistic thoughts, statements, and feelings of being responsible for the loss. Target Date: 2021-07-14 Frequency: Daily Modality: individual Progress: 100%-completed  Related Problem: Complete the process of letting go of the lost significant other. Description: Verbalize and resolve feelings of anger or guilt focused on self or deceased loved one that interfere with the grieving process. Target Date: 2021-07-14 Frequency: Daily Modality: individual Progress: 100%-completed  Related Problem: Complete the process of letting go of the lost significant other. Description: Begin verbalizing feelings associated with the loss. Target Date: 2023-01-14 Frequency: Daily Modality: individual Progress: 85%  Related Problem: Complete the process of letting go of the lost significant other. Description: Identify what stages of grief have been experienced in the continuum of the grieving process. Target Date: 2021-07-14 Frequency: Daily Modality: individual Progress: 100%-completed  Related Problem: Complete the process of letting go of the lost significant other. Description: Participate in a therapy that addresses issues beyond grief that have arisen as a result of the loss. Target Date: 2023-01-14 Frequency: Daily Modality:  individual Progress: 85%  Related Problem: Complete the process of letting go of  the lost significant other. Description: Tell in detail the story of the current loss that is triggering symptoms. Target Date: 2021-07-14 Frequency: Daily Modality: individual Progress: 100%-completed  Client Response full compliance  Service Location Location, [address] [place], [place] [address]  Service Code cpt 3235433276  Validate/empathize  Related past to present  Behavioral activation plan  Facilitate problem solving  Identify automatic thoughts  Rationally challenge thoughts or beliefs/cognitive restructuring  Identify/label emotions  Emotion regulation skills  Psychiatrist  Self care activities  Self-monitoring  Session Notes: Patient requests a video Caregility session. She is at home and I am at my home office.  Dx: Major Depression  Meds: Lexapro (20mg ), Busperone (7.5)  Goals: States that she is seeking counseling to learn to manage symptoms of depression and anxiety. Will utilize insight oriented therapy and behavioral strategies to manage symptoms. Goal date is 12-24 Session notes: Hilda Lias says that she is okay but had a struggle on her anniversary (July 5th). She talked about being a different person now than she was in the past and has some shame for some of the ways she acted. She is seeking to be more positive in her life. A week ago she stepped on a vent in the floor and broke her animal. She had been asking her landlord to fix the vent but fears reporting him because he could evict her. We talked about the need to be a self-advocate. Her family continues to be intrusive in her life and her father gives her the message that her job in life is to care for her mother when he passes on. She says she lives in fear that her mother will be mad at her. Whenever her mother expresses hurt or anger, Hilda Lias "goes in to panic mode". Need to explore further. Her school work is behind and she  is struggling to stay focused. Wants to get her ADHD evaluation.                                                                                Garrel Ridgel, PhD Time: 7:45a-8:30a 45 minutes

## 2022-08-06 ENCOUNTER — Encounter: Payer: Self-pay | Admitting: Nurse Practitioner

## 2022-08-06 ENCOUNTER — Telehealth: Payer: Medicare HMO | Admitting: Nurse Practitioner

## 2022-08-06 DIAGNOSIS — J069 Acute upper respiratory infection, unspecified: Secondary | ICD-10-CM

## 2022-08-06 MED ORDER — PSEUDOEPH-BROMPHEN-DM 30-2-10 MG/5ML PO SYRP
5.0000 mL | ORAL_SOLUTION | Freq: Four times a day (QID) | ORAL | 0 refills | Status: AC | PRN
Start: 2022-08-06 — End: 2022-08-11

## 2022-08-06 MED ORDER — ONDANSETRON 4 MG PO TBDP: 4.0000 mg | ORAL_TABLET | Freq: Three times a day (TID) | ORAL | 0 refills | Status: DC | PRN

## 2022-08-06 MED ORDER — IPRATROPIUM BROMIDE 0.03 % NA SOLN: 2.0000 | Freq: Two times a day (BID) | NASAL | 12 refills | Status: DC

## 2022-08-06 NOTE — Progress Notes (Signed)
Virtual Visit Consent   [name], you are scheduled for a virtual visit with a [facility] provider today. Just as with appointments in the office, your consent must be obtained to participate. Your consent will be active for this visit and any virtual visit you may have with one of our providers in the next 365 days. If you have a MyChart account, a copy of this consent can be sent to you electronically.  As this is a virtual visit, video technology does not allow for your provider to perform a traditional examination. This may limit your provider's ability to fully assess your condition. If your provider identifies any concerns that need to be evaluated in person or the need to arrange testing (such as labs, EKG, etc.), we will make arrangements to do so. Although advances in technology are sophisticated, we cannot ensure that it will always work on either your end or our end. If the connection with a video visit is poor, the visit may have to be switched to a telephone visit. With either a video or telephone visit, we are not always able to ensure that we have a secure connection.  By engaging in this virtual visit, you consent to the provision of healthcare and authorize for your insurance to be billed (if applicable) for the services provided during this visit. Depending on your insurance coverage, you may receive a charge related to this service.  I need to obtain your verbal consent now. Are you willing to proceed with your visit today? [name] has provided verbal consent on 08/06/2022 for a virtual visit (video or telephone). [name], FNP  Date: 08/06/2022 5:57 PM  Virtual Visit via Video Note   I, [name], connected with  [name]  ([id number], [birth year]) on 08/06/22 at  6:00 PM EDT by a video-enabled telemedicine application and verified that I am speaking with the correct person using two identifiers.  Location: Patient: Virtual Visit Location  Patient: Home Provider: Virtual Visit Location Provider: Home Office   I discussed the limitations of evaluation and management by telemedicine and the availability of in person appointments. The patient expressed understanding and agreed to proceed.    History of Present Illness: [name] is a 47 y.o. who identifies as a female who was assigned female at birth, and is being seen today for nasal congestion and fatigue with fever  She has taken a home COVID test and it was negative  Today is day 3  She has developed nausea as well  She has been able to eat  She is very thirsty   She has been taking children's motrin and aleve    She has a history of asthma   She does have chronic allergies but is not taking any allergy medicine at this time   She has had bronchitis and pneumonia in the past  She has had the pneumonia vaccine    Problems:  Patient Active Problem List   Diagnosis Date Noted   Major depressive disorder, recurrent episode, moderate (HCC) 06/07/2022   Respiratory illness 03/30/2022   Restless leg syndrome 03/30/2022   Right knee pain 03/12/2022   Septic joint of right knee joint (HCC) 03/12/2022   Asthma 03/12/2022   Hypokalemia 03/12/2022   Tobacco abuse 03/12/2022   Cellulitis of right lower extremity 03/12/2022   Pain and swelling of right knee 03/04/2022   Constipation 12/18/2021   Cervical cancer screening 05/08/2019   Breast cancer screening by mammogram  05/08/2019   Tennis elbow 11/27/2018   Positive urine drug screen 07/15/2018   Closed left ankle fracture 06/14/2018   Attention deficit 05/08/2018   Acute cough 02/15/2018   Family history of colon cancer 02/15/2018   Vitamin D deficiency 09/17/2015   Muscular dystrophy (HCC) 03/26/2015   Neuropathic pain 03/26/2015   Irregular periods 02/26/2015   Allergic rhinitis 02/26/2015   Tooth pain 01/08/2015   Anxiety and depression 12/26/2014   Charcot-Marie-Tooth disease 12/26/2014    Stress incontinence 12/26/2014   Iron deficiency 12/26/2014   Current tobacco use 07/11/2013   Chronic pain 08/25/2012   Foot pain 08/25/2012   Other long term (current) drug therapy 09/30/2011   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   Arthritis, neuropathic 03/18/2011   Closed dislocation of tarsometatarsal joint 03/18/2011   Acquired deformities of toe 03/04/2011   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, Disp: 30 tablet, Rfl: 0   busPIRone (BUSPAR) 7.5 MG tablet, Take 1 tablet (7.5 mg total) by mouth 3 (three) times daily., Disp: 270 tablet, Rfl: 0   escitalopram (LEXAPRO) 20 MG tablet, Take 1 tablet (20 mg total) by mouth daily., Disp: 90 tablet, Rfl: 0   fluticasone (FLONASE) 50 MCG/ACT nasal spray, Place 2 sprays into both nostrils daily., Disp: 16 g, Rfl: 0   gentamicin cream (GARAMYCIN) 0.1 %, Apply 1 Application topically 2 (two) times daily., Disp: 30 g, Rfl: 1   HYDROcodone-acetaminophen (NORCO) 5-325 MG tablet, Take 1 tablet by mouth every 8 (eight) hours as needed for moderate pain., Disp: 15 tablet, Rfl: 0   naproxen (NAPROSYN) 500 MG tablet, Take 1 tablet (500 mg total) by mouth 2 (two) times daily with a meal., Disp: 60 tablet, Rfl: 2   polyethylene glycol powder (GLYCOLAX/MIRALAX) 17 GM/SCOOP powder, Take 17 g by mouth daily as needed for mild constipation., Disp: 500 g,  Rfl: 0  Observations/Objective: Patient is well-developed, well-nourished in no acute distress.  Resting comfortably  at home.  Head is normocephalic, atraumatic.  No labored breathing.  Speech is clear and coherent with logical content.  Patient is alert and oriented at baseline.    Assessment and Plan: 1. Viral URI  Discussed early for antibiotics would suggest OTC and regimen below for the next 5 days and if symptoms persist follow up as discussed   Earlier with new or worsening  - ipratropium (ATROVENT) 0.03 % nasal spray; Place 2 sprays into both nostrils every 12 (twelve) hours.  Dispense: 30 mL; Refill: 12 - brompheniramine-pseudoephedrine-DM 30-2-10 MG/5ML syrup; Take 5 mLs by mouth 4 (four) times daily as needed for up to 5 days.  Dispense: 120 mL; Refill: 0 - ondansetron (ZOFRAN-ODT) 4 MG disintegrating tablet; Take 1 tablet (4 mg total) by mouth every 8 (eight) hours as needed.  Dispense: 20 tablet; Refill: 0     Follow  Up Instructions: I discussed the assessment and treatment plan with the patient. The patient was provided an opportunity to ask questions and all were answered. The patient agreed with the plan and demonstrated an understanding of the instructions.  A copy of instructions were sent to the patient via MyChart unless otherwise noted below.    The patient was advised to call back or seek an in-person evaluation if the symptoms worsen or if the condition fails to improve as anticipated.  Time:  I spent 15 minutes with the patient via telehealth technology discussing the above problems/concerns.    [name], FNP

## 2022-08-08 ENCOUNTER — Telehealth: Payer: Medicare HMO | Admitting: Physician Assistant

## 2022-08-08 DIAGNOSIS — J069 Acute upper respiratory infection, unspecified: Secondary | ICD-10-CM | POA: Diagnosis not present

## 2022-08-08 DIAGNOSIS — B9689 Other specified bacterial agents as the cause of diseases classified elsewhere: Secondary | ICD-10-CM

## 2022-08-08 MED ORDER — DOXYCYCLINE HYCLATE 100 MG PO TABS: 100.0000 mg | ORAL_TABLET | Freq: Two times a day (BID) | ORAL | 0 refills | Status: DC

## 2022-08-08 NOTE — Patient Instructions (Signed)
Tonisha Simona Huh, thank you for joining [name], [name] for today's virtual visit.  While this provider is not your primary care provider (PCP), if your PCP is located in our provider database this encounter information will be shared with them immediately following your visit.   A [facility] MyChart account gives you access to today's visit and all your visits, tests, and labs performed at [facility] " click here if you don't have a [facility] MyChart account or go to [email or web address]  Consent: (Patient) [name] provided verbal consent for this virtual visit at the beginning of the encounter.  Current Medications:  Current Outpatient Medications:    albuterol (VENTOLIN HFA) 108 (90 Base) MCG/ACT inhaler, INHALE 2 PUFFS EVERY 6 HOURS AS NEEDED FOR WHEEZE OR SHORTNESS OF BREATH, Disp: 18 each, Rfl: 1   aspirin 325 MG tablet, Take 1 tablet (325 mg total) by mouth daily., Disp: 30 tablet, Rfl: 0   brompheniramine-pseudoephedrine-DM 30-2-10 MG/5ML syrup, Take 5 mLs by mouth 4 (four) times daily as needed for up to 5 days., Disp: 120 mL, Rfl: 0   busPIRone (BUSPAR) 7.5 MG tablet, Take 1 tablet (7.5 mg total) by mouth 3 (three) times daily., Disp: 270 tablet, Rfl: 0   doxycycline (VIBRA-TABS) 100 MG tablet, Take 1 tablet (100 mg total) by mouth 2 (two) times daily., Disp: 20 tablet, Rfl: 0   escitalopram (LEXAPRO) 20 MG tablet, Take 1 tablet (20 mg total) by mouth daily., Disp: 90 tablet, Rfl: 0   fluticasone (FLONASE) 50 MCG/ACT nasal spray, Place 2 sprays into both nostrils daily., Disp: 16 g, Rfl: 0   gentamicin cream (GARAMYCIN) 0.1 %, Apply 1 Application topically 2 (two) times daily., Disp: 30 g, Rfl: 1   HYDROcodone-acetaminophen (NORCO) 5-325 MG tablet, Take 1 tablet by mouth every 8 (eight) hours as needed for moderate pain., Disp: 15 tablet, Rfl: 0   ipratropium (ATROVENT) 0.03 % nasal spray, Place 2 sprays into both nostrils every 12  (twelve) hours., Disp: 30 mL, Rfl: 12   naproxen (NAPROSYN) 500 MG tablet, Take 1 tablet (500 mg total) by mouth 2 (two) times daily with a meal., Disp: 60 tablet, Rfl: 2   ondansetron (ZOFRAN-ODT) 4 MG disintegrating tablet, Take 1 tablet (4 mg total) by mouth every 8 (eight) hours as needed., Disp: 20 tablet, Rfl: 0   polyethylene glycol powder (GLYCOLAX/MIRALAX) 17 GM/SCOOP powder, Take 17 g by mouth daily as needed for mild constipation., Disp: 500 g, Rfl: 0   Medications ordered in this encounter:  Meds ordered this encounter  Medications   DISCONTD: doxycycline (VIBRA-TABS) 100 MG tablet    Sig: Take 1 tablet (100 mg total) by mouth 2 (two) times daily.    Dispense:  20 tablet    Refill:  0    Order Specific Question:   Supervising Provider    Answer:   Merrilee Jansky [[id number]]   DISCONTD: doxycycline (VIBRA-TABS) 100 MG tablet    Sig: Take 1 tablet (100 mg total) by mouth 2 (two) times daily.    Dispense:  20 tablet    Refill:  0    Order Specific Question:   Supervising Provider    Answer:   Merrilee Jansky [id number]   doxycycline (VIBRA-TABS) 100 MG tablet    Sig: Take 1 tablet (100 mg total) by mouth 2 (two) times daily.    Dispense:  20 tablet    Refill:  0    Order Specific Question:  Supervising Provider    Answer:   Merrilee Jansky [[id number]]     *If you need refills on other medications prior to your next appointment, please contact your pharmacy*  Follow-Up: Call back or seek an in-person evaluation if the symptoms worsen or if the condition fails to improve as anticipated.  [facility] [phone]  Other Instructions Upper Respiratory Infection, Adult An upper respiratory infection (URI) is a common viral infection of the nose, throat, and upper air passages that lead to the lungs. The most common type of URI is the common cold. URIs usually get better on their own, without medical treatment. What are the causes? A URI is caused by a  virus. You may catch a virus by: Breathing in droplets from an infected person's cough or sneeze. Touching something that has been exposed to the virus (is contaminated) and then touching your mouth, nose, or eyes. What increases the risk? You are more likely to get a URI if: You are very young or very old. You have close contact with others, such as at work, school, or a health care facility. You smoke. You have long-term (chronic) heart or lung disease. You have a weakened disease-fighting system (immune system). You have nasal allergies or asthma. You are experiencing a lot of stress. You have poor nutrition. What are the signs or symptoms? A URI usually involves some of the following symptoms: Runny or stuffy (congested) nose. Cough. Sneezing. Sore throat. Headache. Fatigue. Fever. Loss of appetite. Pain in your forehead, behind your eyes, and over your cheekbones (sinus pain). Muscle aches. Redness or irritation of the eyes. Pressure in the ears or face. How is this diagnosed? This condition may be diagnosed based on your medical history and symptoms, and a physical exam. Your health care provider may use a swab to take a mucus sample from your nose (nasal swab). This sample can be tested to determine what virus is causing the illness. How is this treated? URIs usually get better on their own within 7-10 days. Medicines cannot cure URIs, but your health care provider may recommend certain medicines to help relieve symptoms, such as: Over-the-counter cold medicines. Cough suppressants. Coughing is a type of defense against infection that helps to clear the respiratory system, so take these medicines only as recommended by your health care provider. Fever-reducing medicines. Follow these instructions at home: Activity Rest as needed. If you have a fever, stay home from work or school until your fever is gone or until your health care provider says your URI cannot spread to  other people (is no longer contagious). Your health care provider may have you wear a face mask to prevent your infection from spreading. Relieving symptoms Gargle with a mixture of salt and water 3-4 times a day or as needed. To make salt water, completely dissolve -1 tsp (3-6 g) of salt in 1 cup (237 mL) of warm water. Use a cool-mist humidifier to add moisture to the air. This can help you breathe more easily. Eating and drinking  Drink enough fluid to keep your urine pale yellow. Eat soups and other clear broths. General instructions  Take over-the-counter and prescription medicines only as told by your health care provider. These include cold medicines, fever reducers, and cough suppressants. Do not use any products that contain nicotine or tobacco. These products include cigarettes, chewing tobacco, and vaping devices, such as e-cigarettes. If you need help quitting, ask your health care provider. Stay away from secondhand smoke.  Stay up to date on all immunizations, including the yearly (annual) flu vaccine. Keep all follow-up visits. This is important. How to prevent the spread of infection to others URIs can be contagious. To prevent the infection from spreading: Wash your hands with soap and water for at least 20 seconds. If soap and water are not available, use hand sanitizer. Avoid touching your mouth, face, eyes, or nose. Cough or sneeze into a tissue or your sleeve or elbow instead of into your hand or into the air.  Contact a health care provider if: You are getting worse instead of better. You have a fever or chills. Your mucus is brown or red. You have yellow or brown discharge coming from your nose. You have pain in your face, especially when you bend forward. You have swollen neck glands. You have pain while swallowing. You have white areas in the back of your throat. Get help right away if: You have shortness of breath that gets worse. You have severe or  persistent: Headache. Ear pain. Sinus pain. Chest pain. You have chronic lung disease along with any of the following: Making high-pitched whistling sounds when you breathe, most often when you breathe out (wheezing). Prolonged cough (more than 14 days). Coughing up blood. A change in your usual mucus. You have a stiff neck. You have changes in your: Vision. Hearing. Thinking. Mood. These symptoms may be an emergency. Get help right away. Call 911. Do not wait to see if the symptoms will go away. Do not drive yourself to the hospital. Summary An upper respiratory infection (URI) is a common infection of the nose, throat, and upper air passages that lead to the lungs. A URI is caused by a virus. URIs usually get better on their own within 7-10 days. Medicines cannot cure URIs, but your health care provider may recommend certain medicines to help relieve symptoms. This information is not intended to replace advice given to you by your health care provider. Make sure you discuss any questions you have with your health care provider. Document Revised: 08/06/2020 Document Reviewed: 08/06/2020 Elsevier Patient Education  2024 [employer]    If you have been instructed to have an in-person evaluation today at a local Urgent Care facility, please use the link below. It will take you to a list of all of our available [facility], including address, phone number and hours of operation. Please do not delay care.  [facility]  If you or a family member do not have a primary care provider, use the link below to schedule a visit and establish care. When you choose a [facility] primary care physician or advanced practice provider, you gain a long-term partner in health. Find a Primary Care Provider  Learn more about [facility]'s in-office and virtual care options: [place] - Get Care Now

## 2022-08-08 NOTE — Progress Notes (Signed)
Virtual Visit Consent   [name], you are scheduled for a virtual visit with a [facility] provider today. Just as with appointments in the office, your consent must be obtained to participate. Your consent will be active for this visit and any virtual visit you may have with one of our providers in the next 365 days. If you have a MyChart account, a copy of this consent can be sent to you electronically.  As this is a virtual visit, video technology does not allow for your provider to perform a traditional examination. This may limit your provider's ability to fully assess your condition. If your provider identifies any concerns that need to be evaluated in person or the need to arrange testing (such as labs, EKG, etc.), we will make arrangements to do so. Although advances in technology are sophisticated, we cannot ensure that it will always work on either your end or our end. If the connection with a video visit is poor, the visit may have to be switched to a telephone visit. With either a video or telephone visit, we are not always able to ensure that we have a secure connection.  By engaging in this virtual visit, you consent to the provision of healthcare and authorize for your insurance to be billed (if applicable) for the services provided during this visit. Depending on your insurance coverage, you may receive a charge related to this service.  I need to obtain your verbal consent now. Are you willing to proceed with your visit today? [name] has provided verbal consent on 08/08/2022 for a virtual visit (video or telephone). Kelly Loveless, [name]  Date: 08/08/2022 3:08 PM  Virtual Visit via Video Note   I, [name], connected with  [name]  ([id number], Nov 14, 1975) on 08/08/22 at  3:00 PM EDT by a video-enabled telemedicine application and verified that I am speaking with the correct person using two identifiers.  Location: Patient: Virtual  Visit Location Patient: Home Provider: Virtual Visit Location Provider: Home Office   I discussed the limitations of evaluation and management by telemedicine and the availability of in person appointments. The patient expressed understanding and agreed to proceed.    History of Present Illness: [name] is a 47 y.o. who identifies as a female who was assigned female at birth, and is being seen today for continued URI symptoms. Seen virtually on 08/06/22 and diagnosed with Viral URI. Prescribed Atrovent nasal spray, Zofran, and Bromfed DM cough syrup. Has had no improvement in symptoms. She is on Day 6 of symptoms. Still having head congestion, fatigue, mild nausea still persist.    Problems:  Patient Active Problem List   Diagnosis Date Noted   Major depressive disorder, recurrent episode, moderate (HCC) 06/07/2022   Respiratory illness 03/30/2022   Restless leg syndrome 03/30/2022   Right knee pain 03/12/2022   Septic joint of right knee joint (HCC) 03/12/2022   Asthma 03/12/2022   Hypokalemia 03/12/2022   Tobacco abuse 03/12/2022   Cellulitis of right lower extremity 03/12/2022   Pain and swelling of right knee 03/04/2022   Constipation 12/18/2021   Cervical cancer screening 05/08/2019   Breast cancer screening by mammogram 05/08/2019   Tennis elbow 11/27/2018   Positive urine drug screen 07/15/2018   Closed left ankle fracture 06/14/2018   Attention deficit 05/08/2018   Acute cough 02/15/2018   Family history of colon cancer 02/15/2018   Vitamin D deficiency 09/17/2015   Muscular dystrophy (HCC) 03/26/2015  Neuropathic pain 03/26/2015   Irregular periods 02/26/2015   Allergic rhinitis 02/26/2015   Tooth pain 01/08/2015   Anxiety and depression 12/26/2014   Charcot-Marie-Tooth disease 12/26/2014   Stress incontinence 12/26/2014   Iron deficiency 12/26/2014   Current tobacco use 07/11/2013   Chronic pain 08/25/2012   Foot pain 08/25/2012   Other long term  (current) drug therapy 09/30/2011   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   Arthritis, neuropathic 03/18/2011   Closed dislocation of tarsometatarsal joint 03/18/2011   Acquired deformities of toe 03/04/2011   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   Ulcer of right foot (HCC) 12/21/2010    Allergies:  Allergies  Allergen Reactions   Bee Venom Swelling   Vancomycin Shortness Of Breath   Penicillins     Other reaction(s): RASH   Sulfamethoxazole-Trimethoprim Itching   Tramadol Nausea Only    Other reaction(s): NAUSEA   Clindamycin Nausea Only    And heartburn   Medications:  Current Outpatient Medications:    doxycycline (VIBRA-TABS) 100 MG tablet, Take 1 tablet (100 mg total) by mouth 2 (two) times daily., Disp: 20 tablet, Rfl: 0   albuterol (VENTOLIN HFA) 108 (90 Base) MCG/ACT inhaler, INHALE 2 PUFFS EVERY 6 HOURS AS NEEDED FOR WHEEZE OR SHORTNESS OF BREATH, Disp: 18 each, Rfl: 1   aspirin 325 MG tablet, Take 1 tablet (325 mg total) by mouth daily., Disp: 30 tablet, Rfl: 0   brompheniramine-pseudoephedrine-DM 30-2-10 MG/5ML syrup, Take 5 mLs by mouth 4 (four) times daily as needed for up to 5 days., Disp: 120 mL, Rfl: 0   busPIRone (BUSPAR) 7.5 MG tablet, Take 1 tablet (7.5 mg total) by mouth 3 (three) times daily., Disp: 270 tablet, Rfl: 0   escitalopram (LEXAPRO) 20 MG tablet, Take 1 tablet (20 mg total) by mouth daily., Disp: 90 tablet, Rfl: 0   fluticasone (FLONASE) 50 MCG/ACT nasal spray, Place 2 sprays into both nostrils daily., Disp: 16 g, Rfl: 0   gentamicin cream (GARAMYCIN) 0.1 %, Apply 1 Application topically 2 (two) times daily., Disp: 30 g, Rfl: 1   HYDROcodone-acetaminophen (NORCO) 5-325 MG tablet, Take 1 tablet by mouth every 8 (eight) hours as needed for moderate pain., Disp: 15 tablet, Rfl: 0   ipratropium (ATROVENT) 0.03 % nasal spray, Place 2 sprays into both nostrils every 12 (twelve) hours., Disp: 30 mL, Rfl: 12   naproxen (NAPROSYN) 500 MG  tablet, Take 1 tablet (500 mg total) by mouth 2 (two) times daily with a meal., Disp: 60 tablet, Rfl: 2   ondansetron (ZOFRAN-ODT) 4 MG disintegrating tablet, Take 1 tablet (4 mg total) by mouth every 8 (eight) hours as needed., Disp: 20 tablet, Rfl: 0   polyethylene glycol powder (GLYCOLAX/MIRALAX) 17 GM/SCOOP powder, Take 17 g by mouth daily as needed for mild constipation., Disp: 500 g, Rfl: 0  Observations/Objective: Patient is well-developed, well-nourished in no acute distress.  Resting comfortably at home.  Head is normocephalic, atraumatic.  No labored breathing.  Speech is clear and coherent with logical content.  Patient is alert and oriented at baseline.    Assessment and Plan: 1. Bacterial upper respiratory infection - doxycycline (VIBRA-TABS) 100 MG tablet; Take 1 tablet (100 mg total) by mouth 2 (two) times daily.  Dispense: 20 tablet; Refill: 0  - Worsening symptoms that have not responded to OTC medications.  - Will give Doxycycline - Continue allergy medications.  - Steam and humidifier can help - Stay well hydrated and get plenty of rest.  - Seek in  person evaluation if no symptom improvement or if symptoms worsen   Follow Up Instructions: I discussed the assessment and treatment plan with the patient. The patient was provided an opportunity to ask questions and all were answered. The patient agreed with the plan and demonstrated an understanding of the instructions.  A copy of instructions were sent to the patient via MyChart unless otherwise noted below.    The patient was advised to call back or seek an in-person evaluation if the symptoms worsen or if the condition fails to improve as anticipated.  Time:  I spent 8 minutes with the patient via telehealth technology discussing the above problems/concerns.    Kelly Loveless, [name]

## 2022-08-16 ENCOUNTER — Ambulatory Visit: Payer: Medicare HMO | Admitting: Psychology

## 2022-09-13 ENCOUNTER — Ambulatory Visit: Payer: Medicare HMO | Admitting: Psychology

## 2022-09-13 DIAGNOSIS — F331 Major depressive disorder, recurrent, moderate: Secondary | ICD-10-CM | POA: Diagnosis not present
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
## 2022-09-27 ENCOUNTER — Ambulatory Visit (INDEPENDENT_AMBULATORY_CARE_PROVIDER_SITE_OTHER): Payer: Medicare HMO | Admitting: Psychology

## 2022-09-27 DIAGNOSIS — F329 Major depressive disorder, single episode, unspecified: Secondary | ICD-10-CM | POA: Diagnosis not present

## 2022-09-27 DIAGNOSIS — F331 Major depressive disorder, recurrent, moderate: Secondary | ICD-10-CM

## 2022-09-27 NOTE — Progress Notes (Signed)
Liesel Airabella Schaadt is a 47 y.o. female patient .  Diagnosis 296.30 (Major depressive affective disorder, recurrent episode, unspecified) [n/a]  Symptoms Depressed or irritable mood. (Status: maintained) -- No Description Entered  Diminished interest in or enjoyment of activities. (Status: maintained) -- No Description Entered  Feelings of hopelessness, worthlessness, or inappropriate guilt. (Status: maintained) -- No Description Entered  Lack of energy. (Status: maintained) -- No Description Entered  Unresolved grief issues. (Status: maintained) -- No Description Entered  Medication Status compliance  Safety none  If Suicidal or Homicidal State Action Taken: unspecified  Current Risk: low Medications Hydroxyzine (Dosage: unknown)  Remiron (Dosage: unknown)  Sertraline (Dosage: unknown)  Objectives Related Problem: Appropriately grieve the loss in order to normalize mood and to return to previously adaptive level of functioning. Description: Increasingly verbalize hopeful and positive statements regarding self, others, and the future. Target Date: 2023-01-14 Frequency: Daily Modality: individual Progress: 90%  Related Problem: Appropriately grieve the loss in order to normalize mood and to return to previously adaptive level of functioning. Description: Learn and implement conflict resolution skills to resolve interpersonal problems. Target Date: 2023-01-14 Frequency: Daily Modality: individual Progress: 85%  Related Problem: Appropriately grieve the loss in order to normalize mood and to return to previously adaptive level of functioning. Description: Learn and implement problem-solving and decision-making  skills. Target Date: 2023-01-14 Frequency: Daily Modality: individual Progress: 85%  Related Problem: Appropriately grieve the loss in order to normalize mood and to return to previously adaptive level of functioning. Description: Describe current and past experiences with depression including their impact on functioning and attempts to resolve it. Target Date: 2021-07-14 Frequency: Daily Modality: individual Progress: 100%-completed  Related Problem: Complete the process of letting go of the lost significant other. Description: Report decreased time spent each day focusing on the loss. Target Date: 2022-07-15 Frequency: Daily Modality: individual Progress: 100%  Related Problem: Complete the process of letting go of the lost significant other. Description: Decrease unrealistic thoughts, statements, and feelings of being responsible for the loss. Target Date: 2021-07-14 Frequency: Daily Modality: individual Progress: 100%-completed  Related Problem: Complete the process of letting go of the lost significant other. Description: Verbalize and resolve feelings of anger or guilt focused on self or deceased loved one that interfere with the grieving process. Target Date: 2021-07-14 Frequency: Daily Modality: individual Progress: 100%-completed  Related Problem: Complete the process of letting go of the lost significant other. Description: Begin verbalizing feelings associated with the loss. Target Date: 2023-01-14 Frequency: Daily Modality: individual Progress: 85%  Related Problem: Complete the process of letting go of the lost significant other. Description: Identify what stages of grief have been experienced in the continuum of the grieving process. Target Date:  2021-07-14 Frequency: Daily Modality: individual Progress: 100%-completed  Related Problem: Complete the process of letting go of the lost significant other. Description: Participate in a therapy that addresses  issues beyond grief that have arisen as a result of the loss. Target Date: 2023-01-14 Frequency: Daily Modality: individual Progress: 85%  Related Problem: Complete the process of letting go of the lost significant other. Description: Tell in detail the story of the current loss that is triggering symptoms. Target Date: 2021-07-14 Frequency: Daily Modality: individual Progress: 100%-completed  Client Response full compliance  Service Location Location, [address] [place], [place] [address]  Service Code cpt [phone]  Validate/empathize  Related past to present  Behavioral activation plan  Facilitate problem solving  Identify automatic thoughts  Rationally challenge thoughts or beliefs/cognitive restructuring  Identify/label emotions  Emotion regulation skills  Psychiatrist  Self care activities  Self-monitoring  Session Notes: Patient agrees a video Caregility session and understands the limitations of this platform. She is at home and I am at my home office.  Dx: Major Depression  Meds: Lexapro (20mg ), Busperone (7.5)  Goals: States that she is seeking counseling to learn to manage symptoms of depression and anxiety. Will utilize insight oriented therapy and behavioral strategies to manage symptoms. Goal date is 12-24 Session notes: Hilda Lias says that yesterday was her deceased husband's birthday. She says she "handled it well". She states she has been tired and has been "sleeping a lot". She feels this contributes to over-eating. Discussed her need and desire for more structure and how she can achieve that goal. She says that her brother visited her parents when she was there. Hilda Lias states that she (uncharacteristically) hugged her brother and sister in Social worker. She wanted to show her father that she was making an effort. This was important to her. States that her brother was receptive but sister in law was not. Hilda Lias states she felt good about hugging her brother and feels that  she was about to put the past behind her and forgive. She says she is better able to focus on the "good times" they had growing up. We talked about her challenge in setting limits with parents and setting priorities without fear of parental rejection.                                                                                    Garrel Ridgel, PhD Time: 7:45a-8:30a 45 minutes

## 2022-10-07 ENCOUNTER — Telehealth: Payer: Self-pay

## 2022-10-07 DIAGNOSIS — F32A Depression, unspecified: Secondary | ICD-10-CM

## 2022-10-07 DIAGNOSIS — F419 Anxiety disorder, unspecified: Secondary | ICD-10-CM

## 2022-10-07 NOTE — Telephone Encounter (Signed)
Received a faxed refill request from [facility] pharmacy for escitalopram 20mg . Attempted to call pt to make an appointment but there was no answer, voicemail box was full.  Pt last seen 03/30/22. Needs an appointment to fill medications. Sending to [facility] pool for further follow up.

## 2022-10-11 ENCOUNTER — Ambulatory Visit: Payer: Medicare HMO | Admitting: Psychology

## 2022-10-12 MED ORDER — ESCITALOPRAM OXALATE 20 MG PO TABS
20.0000 mg | ORAL_TABLET | Freq: Every day | ORAL | 0 refills | Status: DC
Start: 2022-10-12 — End: 2022-10-13

## 2022-10-12 NOTE — Telephone Encounter (Signed)
Sent to pharmacy.  Please get her scheduled.  Thanks.

## 2022-10-12 NOTE — Telephone Encounter (Signed)
Patient is scheduled for a virtual on 10/13/22 at 11:00.
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
## 2022-10-13 ENCOUNTER — Telehealth: Payer: Self-pay | Admitting: Family Medicine

## 2022-10-13 ENCOUNTER — Encounter: Payer: Self-pay | Admitting: Family Medicine

## 2022-10-13 ENCOUNTER — Telehealth: Payer: Medicare HMO | Admitting: Family Medicine

## 2022-10-13 DIAGNOSIS — F419 Anxiety disorder, unspecified: Secondary | ICD-10-CM | POA: Diagnosis not present

## 2022-10-13 DIAGNOSIS — F32A Depression, unspecified: Secondary | ICD-10-CM

## 2022-10-13 DIAGNOSIS — Z1211 Encounter for screening for malignant neoplasm of colon: Secondary | ICD-10-CM

## 2022-10-13 MED ORDER — BUSPIRONE HCL 10 MG PO TABS
10.0000 mg | ORAL_TABLET | Freq: Two times a day (BID) | ORAL | 3 refills | Status: DC
Start: 2022-10-13 — End: 2022-11-16

## 2022-10-13 MED ORDER — ESCITALOPRAM OXALATE 20 MG PO TABS
20.0000 mg | ORAL_TABLET | Freq: Every day | ORAL | 0 refills | Status: DC
Start: 2022-10-13 — End: 2023-05-16

## 2022-10-13 MED ORDER — BUSPIRONE HCL 10 MG PO TABS
10.0000 mg | ORAL_TABLET | Freq: Three times a day (TID) | ORAL | 3 refills | Status: DC
Start: 2022-10-13 — End: 2022-10-13

## 2022-10-13 MED ORDER — HYDROXYZINE HCL 10 MG PO TABS: 10.0000 mg | ORAL_TABLET | Freq: Three times a day (TID) | ORAL | 0 refills | Status: DC | PRN

## 2022-10-13 NOTE — Assessment & Plan Note (Signed)
Chronic issue with recent exacerbation.  We will increase her buspirone to 10 mg twice daily.  She will continue Lexapro 20 mg daily.  Will also send in hydroxyzine 10 mg 3 times daily as needed for anxiety.  Discussed only taking this as needed and if it made her excessively drowsy she should discontinue it and let us know.  She will continue to see her therapist.  Will follow-up in 2 months.  She will let her know if she notices any side effects from the increased dose of buspirone.
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
## 2022-10-13 NOTE — Telephone Encounter (Signed)
Please call the pharmacy.  Please have them discontinue the buspirone 10 mg 3 times daily.  I sent in a new prescription for buspirone 10 mg twice daily.  Please also let the patient know that she will be taking the buspirone 10 mg twice daily.  Thanks.

## 2022-10-13 NOTE — Telephone Encounter (Signed)
Called Pharmacy and requested they cancel the Buspirone 10 mg TID and keep the Buspirone 10 mg Bid. Pharmacist states he cancelled the TID and was keeping the BID. Called Patient to let her know it is 2 X  a day not three.Patient voiced understanding.

## 2022-10-25 ENCOUNTER — Ambulatory Visit (INDEPENDENT_AMBULATORY_CARE_PROVIDER_SITE_OTHER): Payer: Medicare HMO | Admitting: Psychology

## 2022-10-25 DIAGNOSIS — F331 Major depressive disorder, recurrent, moderate: Secondary | ICD-10-CM | POA: Diagnosis not present

## 2022-10-25 NOTE — Progress Notes (Signed)
[name] is a 47 y.o. female patient .  Diagnosis 296.30 (Major depressive affective disorder, recurrent episode, unspecified) [n/a]  Symptoms Depressed or irritable mood. (Status: maintained) -- No Description Entered  Diminished interest in or enjoyment of activities. (Status: maintained) -- No Description Entered  Feelings of hopelessness, worthlessness, or inappropriate guilt. (Status: maintained) -- No Description Entered  Lack of energy. (Status: maintained) -- No Description Entered  Unresolved grief issues. (Status: maintained) -- No Description Entered  Medication Status compliance  Safety none  If Suicidal or Homicidal State Action Taken: unspecified  Current Risk: low Medications Hydroxyzine (Dosage: unknown)  Remiron (Dosage: unknown)  Sertraline (Dosage: unknown)  Objectives Related Problem: Appropriately grieve the loss in order to normalize mood and to return to previously adaptive level of functioning. Description: Increasingly verbalize hopeful and positive statements regarding self, others, and the future. Target Date: 2023-01-14 Frequency: Daily Modality: individual Progress: 90%  Related Problem: Appropriately grieve the loss in order to normalize mood and to return to previously adaptive level of functioning. Description: Learn and implement conflict resolution skills to resolve interpersonal problems. Target Date: 2023-01-14 Frequency: Daily Modality: individual Progress: 85%  Related Problem: Appropriately grieve the loss in order to normalize mood and to return to previously adaptive level of functioning. Description: Learn and implement  problem-solving and decision-making skills. Target Date: 2023-01-14 Frequency: Daily Modality: individual Progress: 85%  Related Problem: Appropriately grieve the loss in order to normalize mood and to return to previously adaptive level of functioning. Description: Describe current and past experiences with depression including their impact on functioning and attempts to resolve it. Target Date: 2021-07-14 Frequency: Daily Modality: individual Progress: 100%-completed  Related Problem: Complete the process of letting go of the lost significant other. Description: Report decreased time spent each day focusing on the loss. Target Date: 2022-07-15 Frequency: Daily Modality: individual Progress: 100%  Related Problem: Complete the process of letting go of the lost significant other. Description: Decrease unrealistic thoughts, statements, and feelings of being responsible for the loss. Target Date: 2021-07-14 Frequency: Daily Modality: individual Progress: 100%-completed  Related Problem: Complete the process of letting go of the lost significant other. Description: Verbalize and resolve feelings of anger or guilt focused on self or deceased loved one that interfere with the grieving process. Target Date: 2021-07-14 Frequency: Daily Modality: individual Progress: 100%-completed  Related Problem: Complete the process of letting go of the lost significant other. Description: Begin verbalizing feelings associated with the loss. Target Date: 2023-01-14 Frequency: Daily Modality: individual Progress: 85%  Related Problem: Complete the process of letting go of the lost significant other. Description: Identify what  stages of grief have been experienced in the continuum of the grieving process. Target Date: 2021-07-14 Frequency: Daily Modality: individual Progress: 100%-completed  Related Problem: Complete the process of letting go of the lost significant other. Description:  Participate in a therapy that addresses issues beyond grief that have arisen as a result of the loss. Target Date: 2023-01-14 Frequency: Daily Modality: individual Progress: 85%  Related Problem: Complete the process of letting go of the lost significant other. Description: Tell in detail the story of the current loss that is triggering symptoms. Target Date: 2021-07-14 Frequency: Daily Modality: individual Progress: 100%-completed  Client Response full compliance  Service Location Location, [address] [place], [place] [address]  Service Code cpt [phone]  Validate/empathize  Related past to present  Behavioral activation plan  Facilitate problem solving  Identify automatic thoughts  Rationally challenge thoughts or beliefs/cognitive restructuring  Identify/label emotions  Emotion regulation skills  Psychiatrist  Self care activities  Self-monitoring  Session Notes: Patient agrees a video Caregility session and understands the limitations of this platform. She is at home and I am at my home office.  Dx: Major Depression  Meds: Lexapro (20mg ), Busperone (7.5)  Goals: States that she is seeking counseling to learn to manage symptoms of depression and anxiety. Will utilize insight oriented therapy and behavioral strategies to manage symptoms. Goal date is 12-24 Session notes: [name] has been trying to do better with planning her days. She is very frustrated in that she is feeling that everyone, friends, neighbors and family, are all taking advantage of her. She still struggles to take care of herself and is putting everyone else's need ahead of her own. Plans to improve diet and join a gym. She did manage to set up an ADHD evaluation.  [name] feels overwhelmed by the pressure she continues to feel from her parents. Feels powerless to change and fear of rejection is the underlying reason. We discussed that she can take "calculated" risks with her parents.                                                                                     Garrel Ridgel, PhD Time: 7:45a-8:30a 45 minutes

## 2022-11-08 ENCOUNTER — Ambulatory Visit: Payer: Medicare HMO | Admitting: Psychology

## 2022-11-10 ENCOUNTER — Telehealth: Payer: Medicare HMO | Admitting: Physician Assistant

## 2022-11-10 DIAGNOSIS — J019 Acute sinusitis, unspecified: Secondary | ICD-10-CM | POA: Diagnosis not present

## 2022-11-10 DIAGNOSIS — B9689 Other specified bacterial agents as the cause of diseases classified elsewhere: Secondary | ICD-10-CM | POA: Diagnosis not present

## 2022-11-10 MED ORDER — DOXYCYCLINE HYCLATE 100 MG PO TABS: 100.0000 mg | ORAL_TABLET | Freq: Two times a day (BID) | ORAL | 0 refills | Status: DC

## 2022-11-10 NOTE — Progress Notes (Signed)
Virtual Visit Consent   [name], you are scheduled for a virtual visit with a [facility] provider today. Just as with appointments in the office, your consent must be obtained to participate. Your consent will be active for this visit and any virtual visit you may have with one of our providers in the next 365 days. If you have a MyChart account, a copy of this consent can be sent to you electronically.  As this is a virtual visit, video technology does not allow for your provider to perform a traditional examination. This may limit your provider's ability to fully assess your condition. If your provider identifies any concerns that need to be evaluated in person or the need to arrange testing (such as labs, EKG, etc.), we will make arrangements to do so. Although advances in technology are sophisticated, we cannot ensure that it will always work on either your end or our end. If the connection with a video visit is poor, the visit may have to be switched to a telephone visit. With either a video or telephone visit, we are not always able to ensure that we have a secure connection.  By engaging in this virtual visit, you consent to the provision of healthcare and authorize for your insurance to be billed (if applicable) for the services provided during this visit. Depending on your insurance coverage, you may receive a charge related to this service.  I need to obtain your verbal consent now. Are you willing to proceed with your visit today? [name] has provided verbal consent on 11/10/2022 for a virtual visit (video or telephone). [name], [place]  Date: 11/10/2022 8:49 AM  Virtual Visit via Video Note   I, [name], connected with  Onda Pry  ([id number], May 21, 1975) on 11/10/22 at  8:45 AM EDT by a video-enabled telemedicine application and verified that I am speaking with the correct person using two identifiers.  Location: Patient:  Virtual Visit Location Patient: Home Provider: Virtual Visit Location Provider: Home Office   I discussed the limitations of evaluation and management by telemedicine and the availability of in person appointments. The patient expressed understanding and agreed to proceed.    History of Present Illness: [name] is a 47 y.o. who identifies as a female who was assigned female at birth, and is being seen today for some ongoing sinus symptoms acutely worsening over the past few days. Now with facial pain and thick nasal discharge. Has been using Flonase and Neti Pot without substantial relief. Denies fever, chills. Denies chest congestion but slight dry cough.  Has also just started Mucinex but stopped cause it makes her feels bad.   HPI: HPI  Problems:  Patient Active Problem List   Diagnosis Date Noted   Major depressive disorder, recurrent episode, moderate (HCC) 06/07/2022   Respiratory illness 03/30/2022   Restless leg syndrome 03/30/2022   Right knee pain 03/12/2022   Septic joint of right knee joint (HCC) 03/12/2022   Asthma 03/12/2022   Hypokalemia 03/12/2022   Tobacco abuse 03/12/2022   Cellulitis of right lower extremity 03/12/2022   Pain and swelling of right knee 03/04/2022   Constipation 12/18/2021   Cervical cancer screening 05/08/2019   Breast cancer screening by mammogram 05/08/2019   Tennis elbow 11/27/2018   Positive urine drug screen 07/15/2018   Closed left ankle fracture 06/14/2018   Attention deficit 05/08/2018   Acute cough 02/15/2018   Family history of colon cancer 02/15/2018  Vitamin D deficiency 09/17/2015   Muscular dystrophy (HCC) 03/26/2015   Neuropathic pain 03/26/2015   Irregular periods 02/26/2015   Allergic rhinitis 02/26/2015   Tooth pain 01/08/2015   Anxiety and depression 12/26/2014   Charcot-Marie-Tooth disease 12/26/2014   Stress incontinence 12/26/2014   Iron deficiency 12/26/2014   Current tobacco use 07/11/2013   Chronic  pain 08/25/2012   Foot pain 08/25/2012   Other long term (current) drug therapy 09/30/2011   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   Arthritis, neuropathic 03/18/2011   Closed dislocation of tarsometatarsal joint 03/18/2011   Acquired deformities of toe 03/04/2011   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   Ulcer of right foot (HCC) 12/21/2010    Allergies:  Allergies  Allergen Reactions   Bee Venom Swelling   Vancomycin Shortness Of Breath   Penicillins     Other reaction(s): RASH   Sulfamethoxazole-Trimethoprim Itching   Tramadol Nausea Only    Other reaction(s): NAUSEA   Clindamycin Nausea Only    And heartburn   Medications:  Current Outpatient Medications:    doxycycline (VIBRA-TABS) 100 MG tablet, Take 1 tablet (100 mg total) by mouth 2 (two) times daily., Disp: 20 tablet, Rfl: 0   albuterol (VENTOLIN HFA) 108 (90 Base) MCG/ACT inhaler, INHALE 2 PUFFS EVERY 6 HOURS AS NEEDED FOR WHEEZE OR SHORTNESS OF BREATH, Disp: 18 each, Rfl: 1   aspirin 325 MG tablet, Take 1 tablet (325 mg total) by mouth daily., Disp: 30 tablet, Rfl: 0   busPIRone (BUSPAR) 10 MG tablet, Take 1 tablet (10 mg total) by mouth 2 (two) times daily., Disp: 60 tablet, Rfl: 3   escitalopram (LEXAPRO) 20 MG tablet, Take 1 tablet (20 mg total) by mouth daily., Disp: 90 tablet, Rfl: 0   fluticasone (FLONASE) 50 MCG/ACT nasal spray, Place 2 sprays into both nostrils daily., Disp: 16 g, Rfl: 0   hydrOXYzine (ATARAX) 10 MG tablet, Take 1 tablet (10 mg total) by mouth 3 (three) times daily as needed for anxiety., Disp: 30 tablet, Rfl: 0   ipratropium (ATROVENT) 0.03 % nasal spray, Place 2 sprays into both nostrils every 12 (twelve) hours., Disp: 30 mL, Rfl: 12   naproxen (NAPROSYN) 500 MG tablet, Take 1 tablet (500 mg total) by mouth 2 (two) times daily with a meal., Disp: 60 tablet, Rfl: 2   ondansetron (ZOFRAN-ODT) 4 MG disintegrating tablet, Take 1 tablet (4 mg total) by mouth every 8 (eight) hours as  needed., Disp: 20 tablet, Rfl: 0   polyethylene glycol powder (GLYCOLAX/MIRALAX) 17 GM/SCOOP powder, Take 17 g by mouth daily as needed for mild constipation., Disp: 500 g, Rfl: 0  Observations/Objective: Patient is well-developed, well-nourished in no acute distress.  Resting comfortably at home.  Head is normocephalic, atraumatic.  No labored breathing. Speech is clear and coherent with logical content.  Patient is alert and oriented at baseline.   Assessment and Plan: 1. Acute bacterial sinusitis - doxycycline (VIBRA-TABS) 100 MG tablet; Take 1 tablet (100 mg total) by mouth 2 (two) times daily.  Dispense: 20 tablet; Refill: 0  Rx Doxycycline.  Increase fluids.  Rest.  Saline nasal spray.  Probiotic.  Mucinex as directed.  Humidifier in bedroom. Flonase and OTC antihistamine.  Call or return to clinic if symptoms are not improving.   Follow Up Instructions: I discussed the assessment and treatment plan with the patient. The patient was provided an opportunity to ask questions and all were answered. The patient agreed with the plan and demonstrated an understanding of  the instructions.  A copy of instructions were sent to the patient via MyChart unless otherwise noted below.   The patient was advised to call back or seek an in-person evaluation if the symptoms worsen or if the condition fails to improve as anticipated.    Kelly Climes, [name]

## 2022-11-10 NOTE — Patient Instructions (Signed)
Anida Simona Huh, thank you for joining Kelly Climes, [name] for today's virtual visit.  While this provider is not your primary care provider (PCP), if your PCP is located in our provider database this encounter information will be shared with them immediately following your visit.   A [facility] MyChart account gives you access to today's visit and all your visits, tests, and labs performed at [facility] " click here if you don't have a [facility] MyChart account or go to [email or web address]  Consent: (Patient) [name] provided verbal consent for this virtual visit at the beginning of the encounter.  Current Medications:  Current Outpatient Medications:    albuterol (VENTOLIN HFA) 108 (90 Base) MCG/ACT inhaler, INHALE 2 PUFFS EVERY 6 HOURS AS NEEDED FOR WHEEZE OR SHORTNESS OF BREATH, Disp: 18 each, Rfl: 1   aspirin 325 MG tablet, Take 1 tablet (325 mg total) by mouth daily., Disp: 30 tablet, Rfl: 0   busPIRone (BUSPAR) 10 MG tablet, Take 1 tablet (10 mg total) by mouth 2 (two) times daily., Disp: 60 tablet, Rfl: 3   escitalopram (LEXAPRO) 20 MG tablet, Take 1 tablet (20 mg total) by mouth daily., Disp: 90 tablet, Rfl: 0   fluticasone (FLONASE) 50 MCG/ACT nasal spray, Place 2 sprays into both nostrils daily., Disp: 16 g, Rfl: 0   hydrOXYzine (ATARAX) 10 MG tablet, Take 1 tablet (10 mg total) by mouth 3 (three) times daily as needed for anxiety., Disp: 30 tablet, Rfl: 0   ipratropium (ATROVENT) 0.03 % nasal spray, Place 2 sprays into both nostrils every 12 (twelve) hours., Disp: 30 mL, Rfl: 12   naproxen (NAPROSYN) 500 MG tablet, Take 1 tablet (500 mg total) by mouth 2 (two) times daily with a meal., Disp: 60 tablet, Rfl: 2   ondansetron (ZOFRAN-ODT) 4 MG disintegrating tablet, Take 1 tablet (4 mg total) by mouth every 8 (eight) hours as needed., Disp: 20 tablet, Rfl: 0   polyethylene glycol powder (GLYCOLAX/MIRALAX) 17 GM/SCOOP powder, Take 17 g by  mouth daily as needed for mild constipation., Disp: 500 g, Rfl: 0   Medications ordered in this encounter:  No orders of the defined types were placed in this encounter.    *If you need refills on other medications prior to your next appointment, please contact your pharmacy*  Follow-Up: Call back or seek an in-person evaluation if the symptoms worsen or if the condition fails to improve as anticipated.  [facility] [phone]  Other Instructions Please take antibiotic as directed.  Increase fluid intake.  Use Saline nasal spray.  Take a daily multivitamin. Continue Flonase. Start Claritin or Zyrtec OTC.  Place a humidifier in the bedroom.  Please call or return clinic if symptoms are not improving.  Sinusitis Sinusitis is redness, soreness, and swelling (inflammation) of the paranasal sinuses. Paranasal sinuses are air pockets within the bones of your face (beneath the eyes, the middle of the forehead, or above the eyes). In healthy paranasal sinuses, mucus is able to drain out, and air is able to circulate through them by way of your nose. However, when your paranasal sinuses are inflamed, mucus and air can become trapped. This can allow bacteria and other germs to grow and cause infection. Sinusitis can develop quickly and last only a short time (acute) or continue over a long period (chronic). Sinusitis that lasts for more than 12 weeks is considered chronic.  CAUSES  Causes of sinusitis include: Allergies. Structural abnormalities, such as displacement of  the cartilage that separates your nostrils (deviated septum), which can decrease the air flow through your nose and sinuses and affect sinus drainage. Functional abnormalities, such as when the small hairs (cilia) that line your sinuses and help remove mucus do not work properly or are not present. SYMPTOMS  Symptoms of acute and chronic sinusitis are the same. The primary symptoms are pain and pressure around the  affected sinuses. Other symptoms include: Upper toothache. Earache. Headache. Bad breath. Decreased sense of smell and taste. A cough, which worsens when you are lying flat. Fatigue. Fever. Thick drainage from your nose, which often is green and may contain pus (purulent). Swelling and warmth over the affected sinuses. DIAGNOSIS  Your caregiver will perform a physical exam. During the exam, your caregiver may: Look in your nose for signs of abnormal growths in your nostrils (nasal polyps). Tap over the affected sinus to check for signs of infection. View the inside of your sinuses (endoscopy) with a special imaging device with a light attached (endoscope), which is inserted into your sinuses. If your caregiver suspects that you have chronic sinusitis, one or more of the following tests may be recommended: Allergy tests. Nasal culture A sample of mucus is taken from your nose and sent to a lab and screened for bacteria. Nasal cytology A sample of mucus is taken from your nose and examined by your caregiver to determine if your sinusitis is related to an allergy. TREATMENT  Most cases of acute sinusitis are related to a viral infection and will resolve on their own within 10 days. Sometimes medicines are prescribed to help relieve symptoms (pain medicine, decongestants, nasal steroid sprays, or saline sprays).  However, for sinusitis related to a bacterial infection, your caregiver will prescribe antibiotic medicines. These are medicines that will help kill the bacteria causing the infection.  Rarely, sinusitis is caused by a fungal infection. In theses cases, your caregiver will prescribe antifungal medicine. For some cases of chronic sinusitis, surgery is needed. Generally, these are cases in which sinusitis recurs more than 3 times per year, despite other treatments. HOME CARE INSTRUCTIONS  Drink plenty of water. Water helps thin the mucus so your sinuses can drain more easily. Use a  humidifier. Inhale steam 3 to 4 times a day (for example, sit in the bathroom with the shower running). Apply a warm, moist washcloth to your face 3 to 4 times a day, or as directed by your caregiver. Use saline nasal sprays to help moisten and clean your sinuses. Take over-the-counter or prescription medicines for pain, discomfort, or fever only as directed by your caregiver. SEEK IMMEDIATE MEDICAL CARE IF: You have increasing pain or severe headaches. You have nausea, vomiting, or drowsiness. You have swelling around your face. You have vision problems. You have a stiff neck. You have difficulty breathing. MAKE SURE YOU:  Understand these instructions. Will watch your condition. Will get help right away if you are not doing well or get worse. Document Released: 01/04/2005 Document Revised: 03/29/2011 Document Reviewed: 01/19/2011 [facility] Patient Information 2014 [place], [place].    If you have been instructed to have an in-person evaluation today at a local Urgent Care facility, please use the link below. It will take you to a list of all of our available [facility], including address, phone number and hours of operation. Please do not delay care.  [facility]  If you or a family member do not have a primary care provider, use the link  below to schedule a visit and establish care. When you choose a [facility] primary care physician or advanced practice provider, you gain a long-term partner in health. Find a Primary Care Provider  Learn more about [facility]'s in-office and virtual care options: [facility] - Get Care Now

## 2022-11-16 ENCOUNTER — Other Ambulatory Visit: Payer: Self-pay | Admitting: Family Medicine

## 2022-11-16 DIAGNOSIS — F32A Depression, unspecified: Secondary | ICD-10-CM

## 2022-11-22 ENCOUNTER — Ambulatory Visit (INDEPENDENT_AMBULATORY_CARE_PROVIDER_SITE_OTHER): Payer: Medicare HMO | Admitting: Psychology

## 2022-11-22 DIAGNOSIS — F329 Major depressive disorder, single episode, unspecified: Secondary | ICD-10-CM

## 2022-11-22 DIAGNOSIS — F331 Major depressive disorder, recurrent, moderate: Secondary | ICD-10-CM

## 2022-11-22 NOTE — Progress Notes (Signed)
Tranae Chiyoko Torrico is a 47 y.o. female patient .  Diagnosis 296.30 (Major depressive affective disorder, recurrent episode, unspecified) [n/a]  Symptoms Depressed or irritable mood. (Status: maintained) -- No Description Entered  Diminished interest in or enjoyment of activities. (Status: maintained) -- No Description Entered  Feelings of hopelessness, worthlessness, or inappropriate guilt. (Status: maintained) -- No Description Entered  Lack of energy. (Status: maintained) -- No Description Entered  Unresolved grief issues. (Status: maintained) -- No Description Entered  Medication Status compliance  Safety none  If Suicidal or Homicidal State Action Taken: unspecified  Current Risk: low Medications Hydroxyzine (Dosage: unknown)  Remiron (Dosage: unknown)  Sertraline (Dosage: unknown)  Objectives Related Problem: Appropriately grieve the loss in order to normalize mood and to return to previously adaptive level of functioning. Description: Increasingly verbalize hopeful and positive statements regarding self, others, and the future. Target Date: 2023-01-14 Frequency: Daily Modality: individual Progress: 90%  Related Problem: Appropriately grieve the loss in order to normalize mood and to return to previously adaptive level of functioning. Description: Learn and implement conflict resolution skills to resolve interpersonal problems. Target Date: 2023-01-14 Frequency: Daily Modality: individual Progress: 85%  Related Problem: Appropriately grieve the loss in order to normalize mood and to return to previously adaptive level of  functioning. Description: Learn and implement problem-solving and decision-making skills. Target Date: 2023-01-14 Frequency: Daily Modality: individual Progress: 85%  Related Problem: Appropriately grieve the loss in order to normalize mood and to return to previously adaptive level of functioning. Description: Describe current and past experiences with depression including their impact on functioning and attempts to resolve it. Target Date: 2021-07-14 Frequency: Daily Modality: individual Progress: 100%-completed  Related Problem: Complete the process of letting go of the lost significant other. Description: Report decreased time spent each day focusing on the loss. Target Date: 2022-07-15 Frequency: Daily Modality: individual Progress: 100%  Related Problem: Complete the process of letting go of the lost significant other. Description: Decrease unrealistic thoughts, statements, and feelings of being responsible for the loss. Target Date: 2021-07-14 Frequency: Daily Modality: individual Progress: 100%-completed  Related Problem: Complete the process of letting go of the lost significant other. Description: Verbalize and resolve feelings of anger or guilt focused on self or deceased loved one that interfere with the grieving process. Target Date: 2021-07-14 Frequency: Daily Modality: individual Progress: 100%-completed  Related Problem: Complete the process of letting go of the lost significant other. Description: Begin verbalizing feelings associated with the loss. Target Date: 2023-01-14 Frequency: Daily Modality: individual Progress: 85%  Related  Problem: Complete the process of letting go of the lost significant other. Description: Identify what stages of grief have been experienced in the continuum of the grieving process. Target Date: 2021-07-14 Frequency: Daily Modality: individual Progress: 100%-completed  Related Problem: Complete the process of letting go of the  lost significant other. Description: Participate in a therapy that addresses issues beyond grief that have arisen as a result of the loss. Target Date: 2023-01-14 Frequency: Daily Modality: individual Progress: 85%  Related Problem: Complete the process of letting go of the lost significant other. Description: Tell in detail the story of the current loss that is triggering symptoms. Target Date: 2021-07-14 Frequency: Daily Modality: individual Progress: 100%-completed  Client Response full compliance  Service Location Location, [address] [place], [place] [address]  Service Code cpt [phone]  Validate/empathize  Related past to present  Behavioral activation plan  Facilitate problem solving  Identify automatic thoughts  Rationally challenge thoughts or beliefs/cognitive restructuring  Identify/label emotions  Emotion regulation skills  Psychiatrist  Self care activities  Self-monitoring  Session Notes: Patient agrees a video Caregility session and understands the limitations of this platform. She is at home and I am at my home office.  Dx: Major Depression  Meds: Lexapro (20mg ), Busperone (7.5)  Goals: States that she is seeking counseling to learn to manage symptoms of depression and anxiety. Will utilize insight oriented therapy and behavioral strategies to manage symptoms. Goal date is 12-24 Session notes: Hilda Lias says she continues to witness problems with her brother and sister in law in how they treat her and her parents. We talked about the importance of maintaining boundaries and not getting in the middle between brother and parents. Hilda Lias says that she has really been missing her sister and feels the quality of her life would be so much better if her sister was alive. She is struggling to remain positive and feels that life keeps throwing her curve balls and it is discouraging. She gets overly focused on the perceptions of others and it compounds her anxiety/worry.  Discussed strategies to "eliminate the noise" around her and stay focused on the real issues that are important to her.                                                                                      Garrel Ridgel, PhD Time: 7:45a-8:30a 45 minutes

## 2022-12-06 ENCOUNTER — Ambulatory Visit: Payer: Medicare HMO | Admitting: Psychology

## 2022-12-06 NOTE — Progress Notes (Unsigned)
[name] is a 47 y.o. female patient .  Diagnosis 296.30 (Major depressive affective disorder, recurrent episode, unspecified) [n/a]  Symptoms Depressed or irritable mood. (Status: maintained) -- No Description Entered  Diminished interest in or enjoyment of activities. (Status: maintained) -- No Description Entered  Feelings of hopelessness, worthlessness, or inappropriate guilt. (Status: maintained) -- No Description Entered  Lack of energy. (Status: maintained) -- No Description Entered  Unresolved grief issues. (Status: maintained) -- No Description Entered  Medication Status compliance  Safety none  If Suicidal or Homicidal State Action Taken: unspecified  Current Risk: low Medications Hydroxyzine (Dosage: unknown)  Remiron (Dosage: unknown)  Sertraline (Dosage: unknown)  Objectives Related Problem: Appropriately grieve the loss in order to normalize mood and to return to previously adaptive level of functioning. Description: Increasingly verbalize hopeful and positive statements regarding self, others, and the future. Target Date: 2023-01-14 Frequency: Daily Modality: individual Progress: 90%  Related Problem: Appropriately grieve the loss in order to normalize mood and to return to previously adaptive level of functioning. Description: Learn and implement conflict resolution skills to resolve interpersonal problems. Target Date: 2023-01-14 Frequency: Daily Modality: individual Progress: 85%  Related Problem: Appropriately grieve the loss in order to normalize mood and to return to previously adaptive  level of functioning. Description: Learn and implement problem-solving and decision-making skills. Target Date: 2023-01-14 Frequency: Daily Modality: individual Progress: 85%  Related Problem: Appropriately grieve the loss in order to normalize mood and to return to previously adaptive level of functioning. Description: Describe current and past experiences with depression including their impact on functioning and attempts to resolve it. Target Date: 2021-07-14 Frequency: Daily Modality: individual Progress: 100%-completed  Related Problem: Complete the process of letting go of the lost significant other. Description: Report decreased time spent each day focusing on the loss. Target Date: 2022-07-15 Frequency: Daily Modality: individual Progress: 100%  Related Problem: Complete the process of letting go of the lost significant other. Description: Decrease unrealistic thoughts, statements, and feelings of being responsible for the loss. Target Date: 2021-07-14 Frequency: Daily Modality: individual Progress: 100%-completed  Related Problem: Complete the process of letting go of the lost significant other. Description: Verbalize and resolve feelings of anger or guilt focused on self or deceased loved one that interfere with the grieving process. Target Date: 2021-07-14 Frequency: Daily Modality: individual Progress: 100%-completed  Related Problem: Complete the process of letting go of the lost significant other. Description: Begin verbalizing feelings  associated with the loss. Target Date: 2023-01-14 Frequency: Daily Modality: individual Progress: 85%  Related Problem: Complete the process of letting go of the lost significant other. Description: Identify what stages of grief have been experienced in the continuum of the grieving process. Target Date: 2021-07-14 Frequency: Daily Modality: individual Progress: 100%-completed  Related Problem: Complete the process of letting  go of the lost significant other. Description: Participate in a therapy that addresses issues beyond grief that have arisen as a result of the loss. Target Date: 2023-01-14 Frequency: Daily Modality: individual Progress: 85%  Related Problem: Complete the process of letting go of the lost significant other. Description: Tell in detail the story of the current loss that is triggering symptoms. Target Date: 2021-07-14 Frequency: Daily Modality: individual Progress: 100%-completed  Client Response full compliance  Service Location Location, [address] [place], [place] [address]  Service Code cpt [phone]  Validate/empathize  Related past to present  Behavioral activation plan  Facilitate problem solving  Identify automatic thoughts  Rationally challenge thoughts or beliefs/cognitive restructuring  Identify/label emotions  Emotion regulation skills  Psychiatrist  Self care activities  Self-monitoring  Session Notes: Patient agrees a video Caregility session and understands the limitations of this platform. She is at home and I am at my home office.  Dx: Major Depression  Meds: Lexapro (20mg ), Busperone (7.5)  Goals: States that she is seeking counseling to learn to manage symptoms of depression and anxiety. Will utilize insight oriented therapy and behavioral strategies to manage symptoms. Goal date is 12-24 Session notes: Anne Hahn, PhD Time: 7:45a-8:30a 45 minutes

## 2022-12-08 ENCOUNTER — Ambulatory Visit: Payer: Medicare HMO | Admitting: Psychology

## 2022-12-08 DIAGNOSIS — F89 Unspecified disorder of psychological development: Secondary | ICD-10-CM
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
## 2022-12-15 ENCOUNTER — Telehealth: Payer: Medicare HMO | Admitting: Nurse Practitioner

## 2022-12-15 DIAGNOSIS — T3695XA Adverse effect of unspecified systemic antibiotic, initial encounter: Secondary | ICD-10-CM

## 2022-12-15 DIAGNOSIS — B379 Candidiasis, unspecified: Secondary | ICD-10-CM | POA: Diagnosis not present

## 2022-12-15 DIAGNOSIS — T7840XA Allergy, unspecified, initial encounter: Secondary | ICD-10-CM | POA: Diagnosis not present

## 2022-12-15 DIAGNOSIS — R110 Nausea: Secondary | ICD-10-CM

## 2022-12-15 DIAGNOSIS — J0140 Acute pansinusitis, unspecified: Secondary | ICD-10-CM | POA: Diagnosis not present

## 2022-12-15 MED ORDER — PROMETHAZINE HCL 12.5 MG PO TABS: 12.5000 mg | ORAL_TABLET | Freq: Four times a day (QID) | ORAL | 0 refills | Status: DC | PRN

## 2022-12-15 MED ORDER — CETIRIZINE HCL 10 MG PO TABS: 10.0000 mg | ORAL_TABLET | Freq: Every day | ORAL | 2 refills | Status: DC

## 2022-12-15 MED ORDER — FLUCONAZOLE 150 MG PO TABS: 150.0000 mg | ORAL_TABLET | Freq: Once | ORAL | 0 refills | Status: AC

## 2022-12-15 MED ORDER — AZITHROMYCIN 250 MG PO TABS: ORAL_TABLET | ORAL | 0 refills | Status: AC

## 2022-12-15 NOTE — Progress Notes (Signed)
Virtual Visit Consent   [name], you are scheduled for a virtual visit with a [facility] provider today. Just as with appointments in the office, your consent must be obtained to participate. Your consent will be active for this visit and any virtual visit you may have with one of our providers in the next 365 days. If you have a MyChart account, a copy of this consent can be sent to you electronically.  As this is a virtual visit, video technology does not allow for your provider to perform a traditional examination. This may limit your provider's ability to fully assess your condition. If your provider identifies any concerns that need to be evaluated in person or the need to arrange testing (such as labs, EKG, etc.), we will make arrangements to do so. Although advances in technology are sophisticated, we cannot ensure that it will always work on either your end or our end. If the connection with a video visit is poor, the visit may have to be switched to a telephone visit. With either a video or telephone visit, we are not always able to ensure that we have a secure connection.  By engaging in this virtual visit, you consent to the provision of healthcare and authorize for your insurance to be billed (if applicable) for the services provided during this visit. Depending on your insurance coverage, you may receive a charge related to this service.  I need to obtain your verbal consent now. Are you willing to proceed with your visit today? [name] has provided verbal consent on 12/15/2022 for a virtual visit (video or telephone). Kelly Simas, FNP  Date: 12/15/2022 6:10 PM  Virtual Visit via Video Note   I, [name], connected with  [name]  ([id number], 04-Jun-1975) on 12/15/22 at  6:15 PM EST by a video-enabled telemedicine application and verified that I am speaking with the correct person using two identifiers.  Location: Patient: Virtual Visit  Location Patient: Home Provider: Virtual Visit Location Provider: Home Office   I discussed the limitations of evaluation and management by telemedicine and the availability of in person appointments. The patient expressed understanding and agreed to proceed.    History of Present Illness: [name] is a 47 y.o. who identifies as a female who was assigned female at birth, and is being seen today for sinus congestion  She has sinus pressure with facial pain, especially around nose and eyes  Used Netty Pot without relief, also tried tylenol sinus  She is on Flonase daily  She was treated for a sinus infection once month ago and feels she did not have full resolution of symptoms since that time   She has taken a COVID test that was negative   She started to feel much worse 2 nights ago  The drainage is making her feel nauseated with decreased appetite   Problems:  Patient Active Problem List   Diagnosis Date Noted   Major depressive disorder, recurrent episode, moderate (HCC) 06/07/2022   Respiratory illness 03/30/2022   Restless leg syndrome 03/30/2022   Right knee pain 03/12/2022   Septic joint of right knee joint (HCC) 03/12/2022   Asthma 03/12/2022   Hypokalemia 03/12/2022   Tobacco abuse 03/12/2022   Cellulitis of right lower extremity 03/12/2022   Pain and swelling of right knee 03/04/2022   Constipation 12/18/2021   Cervical cancer screening 05/08/2019   Breast cancer screening by mammogram 05/08/2019   Tennis elbow 11/27/2018   Positive  urine drug screen 07/15/2018   Closed left ankle fracture 06/14/2018   Attention deficit 05/08/2018   Acute cough 02/15/2018   Family history of colon cancer 02/15/2018   Vitamin D deficiency 09/17/2015   Muscular dystrophy (HCC) 03/26/2015   Neuropathic pain 03/26/2015   Irregular periods 02/26/2015   Allergic rhinitis 02/26/2015   Tooth pain 01/08/2015   Anxiety and depression 12/26/2014   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   Stress incontinence 12/26/2014   Iron deficiency 12/26/2014   Current tobacco use 07/11/2013   Chronic pain 08/25/2012   Foot pain 08/25/2012   Other long term (current) drug therapy 09/30/2011   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   Arthritis, neuropathic 03/18/2011   Closed dislocation of tarsometatarsal joint 03/18/2011   Acquired deformities of toe 03/04/2011   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, Disp: 30 tablet, Rfl: 0   busPIRone (BUSPAR) 10 MG tablet, TAKE 1 TABLET BY MOUTH TWICE A DAY, Disp: 180 tablet, Rfl: 2   doxycycline (VIBRA-TABS) 100 MG tablet, Take 1 tablet (100 mg total) by mouth 2 (two) times daily., Disp: 20 tablet, Rfl: 0   escitalopram (LEXAPRO) 20 MG tablet, Take 1 tablet (20 mg total) by mouth daily., Disp: 90 tablet, Rfl: 0   fluticasone (FLONASE) 50 MCG/ACT nasal spray, Place 2 sprays into both nostrils daily., Disp: 16 g, Rfl: 0   hydrOXYzine (ATARAX) 10 MG tablet, Take 1 tablet (10 mg total) by mouth 3 (three) times daily as needed for anxiety., Disp: 30 tablet, Rfl: 0   ipratropium (ATROVENT) 0.03 % nasal spray, Place 2 sprays into both nostrils every 12 (twelve) hours., Disp: 30 mL, Rfl: 12   naproxen (NAPROSYN) 500 MG tablet, Take 1 tablet (500 mg total) by mouth 2 (two) times daily with a meal., Disp: 60 tablet, Rfl: 2   ondansetron  (ZOFRAN-ODT) 4 MG disintegrating tablet, Take 1 tablet (4 mg total) by mouth every 8 (eight) hours as needed., Disp: 20 tablet, Rfl: 0   polyethylene glycol powder (GLYCOLAX/MIRALAX) 17 GM/SCOOP powder, Take 17 g by mouth daily as needed for mild constipation., Disp: 500 g, Rfl: 0  Observations/Objective: Patient is well-developed, well-nourished in no acute distress.  Resting comfortably  at home.  Head is normocephalic, atraumatic.  No labored breathing.  Speech is clear and coherent with logical content.  Patient is alert and oriented at baseline.    Assessment and Plan:  1. Subacute pansinusitis  - azithromycin (ZITHROMAX) 250 MG tablet; Take 2 tablets on day 1, then 1 tablet daily on days 2 through 5  Dispense: 6 tablet; Refill: 0  2. Antibiotic-induced yeast infection  - fluconazole (DIFLUCAN) 150 MG tablet; Take 1 tablet (150 mg total) by mouth once for 1 dose.  Dispense: 1 tablet; Refill: 0  3. Allergy, initial encounter  - cetirizine (ZYRTEC ALLERGY) 10 MG  tablet; Take 1 tablet (10 mg total) by mouth at bedtime.  Dispense: 30 tablet; Refill: 2     Continue Flonase daily    Follow Up Instructions: I discussed the assessment and treatment plan with the patient. The patient was provided an opportunity to ask questions and all were answered. The patient agreed with the plan and demonstrated an understanding of the instructions.  A copy of instructions were sent to the patient via MyChart unless otherwise noted below.    The patient was advised to call back or seek an in-person evaluation if the symptoms worsen or if the condition fails to improve as anticipated.    Kelly Simas, FNP

## 2022-12-20 ENCOUNTER — Ambulatory Visit (INDEPENDENT_AMBULATORY_CARE_PROVIDER_SITE_OTHER): Payer: Medicare HMO | Admitting: Psychology

## 2022-12-20 DIAGNOSIS — F339 Major depressive disorder, recurrent, unspecified: Secondary | ICD-10-CM

## 2022-12-20 DIAGNOSIS — F89 Unspecified disorder of psychological development: Secondary | ICD-10-CM

## 2022-12-20 NOTE — Progress Notes (Signed)
[name] is a 47 y.o. female patient .  Diagnosis 296.30 (Major depressive affective disorder, recurrent episode, unspecified) [n/a]  Symptoms Depressed or irritable mood. (Status: maintained) -- No Description Entered  Diminished interest in or enjoyment of activities. (Status: maintained) -- No Description Entered  Feelings of hopelessness, worthlessness, or inappropriate guilt. (Status: maintained) -- No Description Entered  Lack of energy. (Status: maintained) -- No Description Entered  Unresolved grief issues. (Status: maintained) -- No Description Entered  Medication Status compliance  Safety none  If Suicidal or Homicidal State Action Taken: unspecified  Current Risk: low Medications Hydroxyzine (Dosage: unknown)  Remiron (Dosage: unknown)  Sertraline (Dosage: unknown)  Objectives Related Problem: Appropriately grieve the loss in order to normalize mood and to return to previously adaptive level of functioning. Description: Increasingly verbalize hopeful and positive statements regarding self, others, and the future. Target Date: 2023-01-14 Frequency: Daily Modality: individual Progress: 100%  Related Problem: Appropriately grieve the loss in order to normalize mood and to return to previously adaptive level of functioning. Description: Learn and implement conflict resolution skills to resolve interpersonal problems. Target Date: 2023-01-14 Frequency: Daily Modality: individual Progress: 100%  Related Problem: Appropriately grieve the loss in order to normalize mood and to return to previously adaptive  level of functioning. Description: Learn and implement problem-solving and decision-making skills. Target Date: 2023-01-14 Frequency: Daily Modality: individual Progress: 100%  Related Problem: Appropriately grieve the loss in order to normalize mood and to return to previously adaptive level of functioning. Description: Describe current and past experiences with depression including their impact on functioning and attempts to resolve it. Target Date: 2021-07-14 Frequency: Daily Modality: individual Progress: 100%-completed  Related Problem: Complete the process of letting go of the lost significant other. Description: Report decreased time spent each day focusing on the loss. Target Date: 2022-07-15 Frequency: Daily Modality: individual Progress: 100%  Related Problem: Complete the process of letting go of the lost significant other. Description: Decrease unrealistic thoughts, statements, and feelings of being responsible for the loss. Target Date: 2021-07-14 Frequency: Daily Modality: individual Progress: 100%-completed  Related Problem: Complete the process of letting go of the lost significant other. Description: Verbalize and resolve feelings of anger or guilt focused on self or deceased loved one that interfere with the grieving process. Target Date: 2021-07-14 Frequency: Daily Modality: individual Progress: 100%-completed  Related Problem: Complete the process of letting go of the lost significant other. Description: Begin verbalizing feelings  associated with the loss. Target Date: 2023-07-15 Frequency: Daily Modality: individual Progress: 90%  Related Problem: Complete the process of letting go of the lost significant other. Description: Identify what stages of grief have been experienced in the continuum of the grieving process. Target Date: 2021-07-14 Frequency: Daily Modality: individual Progress: 100%-completed  Related Problem: Complete the process of letting  go of the lost significant other. Description: Participate in a therapy that addresses issues beyond grief that have arisen as a result of the loss. Target Date: 2023-07-15 Frequency: Daily Modality: individual Progress: 95%  Related Problem: Complete the process of letting go of the lost significant other. Description: Tell in detail the story of the current loss that is triggering symptoms. Target Date: 2021-07-14 Frequency: Daily Modality: individual Progress: 100%-completed  Client Response full compliance  Service Location Location, [address] [place], [place] [address]  Service Code cpt [phone]  Validate/empathize  Related past to present  Behavioral activation plan  Facilitate problem solving  Identify automatic thoughts  Rationally challenge thoughts or beliefs/cognitive restructuring  Identify/label emotions  Emotion regulation skills  Psychiatrist  Self care activities  Self-monitoring  Session Notes: Patient agrees a video Caregility session and understands the limitations of this platform. She is at home and I am at my home office.  Dx: Major Depression  Meds: Lexapro (20mg ), Busperone (7.5)  Goals: States that she is seeking counseling to learn to manage symptoms of depression and anxiety. Also has significant FOO conflicts with many boundary issues.  Will utilize insight oriented therapy, family systems therapy and behavioral strategies to manage symptoms. Goal date is 6-25 Session notes: [name] says that she has been having memories of her parents not nurturing or comforting her when distressed. She feels it is one of the reasons she feels so fragile when her parents (continuously) challenge her. She is feeling emotionally rejected by her family as an adult in the same way she felt as a child. She feels powerless to change her reaction to parents. Lives in fear that she will upset her parents and they will then reject her. This will highlight her sense of being  alone in the world. We discussed strategies to take the "risk" of prioritizing her own needs over her parents. Will attempt to implement prior to next session.                                                                                         Garrel Ridgel, PhD Time: 7:45a-8:30a 45 minutes

## 2022-12-22 ENCOUNTER — Ambulatory Visit: Payer: Medicare HMO | Admitting: Psychology

## 2022-12-22 DIAGNOSIS — F89 Unspecified disorder of psychological development: Secondary | ICD-10-CM

## 2022-12-22 NOTE — Progress Notes (Signed)
Date: 12/22/2022   Appointment Start Time: 12pm Duration: 65 minutes Provider: Helmut Muster, PsyD Type of Session: Testing Appointment for Evaluation  Location of Patient: Home Location of Provider: Provider's Home (private office) Type of Contact: Caregility video visit with audio  Session Content: Today's appointment was a telepsychological visit. [name] is aware it is her responsibility to secure confidentiality on her end of the session. Prior to proceeding with today's appointment, [name]'s physical location at the time of this appointment was obtained as well a phone number she could be reached at in the event of technical difficulties. [name] denied anyone else being present in the room or on the virtual appointment. This provider reviewed that video should not be captured, photos should not be taken, nor should testing stimuli be copied or recorded as it would be a copyright violation. [name] expressed understanding of the aforementioned, and verbally consented to proceed. The WAIS-IV was administered, scored, and interpreted by this evaluator. [name] is aware of the limitations of teleheath visits and verbally consented to proceed.  Billing codes will be input on the feedback appointment. There are no billing codes for the testing appointment.   Provisional DSM-5 diagnosis(es):  F89 Unspecified Disorder of Psychological Development   Plan: [name] was scheduled for a feedback appointment on 01/05/2023 at 11am via Caregility video visit with audio.                Kelly Gouge, PsyD

## 2023-01-03 ENCOUNTER — Ambulatory Visit: Payer: Medicare HMO | Admitting: Psychology

## 2023-01-03 NOTE — Progress Notes (Unsigned)
[name] is a 47 y.o. female patient .  Diagnosis 296.30 (Major depressive affective disorder, recurrent episode, unspecified) [n/a]  Symptoms Depressed or irritable mood. (Status: maintained) -- No Description Entered  Diminished interest in or enjoyment of activities. (Status: maintained) -- No Description Entered  Feelings of hopelessness, worthlessness, or inappropriate guilt. (Status: maintained) -- No Description Entered  Lack of energy. (Status: maintained) -- No Description Entered  Unresolved grief issues. (Status: maintained) -- No Description Entered  Medication Status compliance  Safety none  If Suicidal or Homicidal State Action Taken: unspecified  Current Risk: low Medications Hydroxyzine (Dosage: unknown)  Remiron (Dosage: unknown)  Sertraline (Dosage: unknown)  Objectives Related Problem: Appropriately grieve the loss in order to normalize mood and to return to previously adaptive level of functioning. Description: Increasingly verbalize hopeful and positive statements regarding self, others, and the future. Target Date: 2023-01-14 Frequency: Daily Modality: individual Progress: 100%  Related Problem: Appropriately grieve the loss in order to normalize mood and to return to previously adaptive level of functioning. Description: Learn and implement conflict resolution skills to resolve interpersonal problems. Target Date: 2023-01-14 Frequency: Daily Modality: individual Progress: 100%  Related Problem: Appropriately grieve the loss in order to normalize mood and to  return to previously adaptive level of functioning. Description: Learn and implement problem-solving and decision-making skills. Target Date: 2023-01-14 Frequency: Daily Modality: individual Progress: 100%  Related Problem: Appropriately grieve the loss in order to normalize mood and to return to previously adaptive level of functioning. Description: Describe current and past experiences with depression including their impact on functioning and attempts to resolve it. Target Date: 2021-07-14 Frequency: Daily Modality: individual Progress: 100%-completed  Related Problem: Complete the process of letting go of the lost significant other. Description: Report decreased time spent each day focusing on the loss. Target Date: 2022-07-15 Frequency: Daily Modality: individual Progress: 100%  Related Problem: Complete the process of letting go of the lost significant other. Description: Decrease unrealistic thoughts, statements, and feelings of being responsible for the loss. Target Date: 2021-07-14 Frequency: Daily Modality: individual Progress: 100%-completed  Related Problem: Complete the process of letting go of the lost significant other. Description: Verbalize and resolve feelings of anger or guilt focused on self or deceased loved one that interfere with the grieving process. Target Date: 2021-07-14 Frequency: Daily Modality: individual Progress: 100%-completed  Related Problem:  Complete the process of letting go of the lost significant other. Description: Begin verbalizing feelings associated with the loss. Target Date: 2023-07-15 Frequency: Daily Modality: individual Progress: 90%  Related Problem: Complete the process of letting go of the lost significant other. Description: Identify what stages of grief have been experienced in the continuum of the grieving process. Target Date: 2021-07-14 Frequency: Daily Modality: individual Progress: 100%-completed  Related Problem:  Complete the process of letting go of the lost significant other. Description: Participate in a therapy that addresses issues beyond grief that have arisen as a result of the loss. Target Date: 2023-07-15 Frequency: Daily Modality: individual Progress: 95%  Related Problem: Complete the process of letting go of the lost significant other. Description: Tell in detail the story of the current loss that is triggering symptoms. Target Date: 2021-07-14 Frequency: Daily Modality: individual Progress: 100%-completed  Client Response full compliance  Service Location Location, [address] [place], [place] [address]  Service Code cpt [phone]  Validate/empathize  Related past to present  Behavioral activation plan  Facilitate problem solving  Identify automatic thoughts  Rationally challenge thoughts or beliefs/cognitive restructuring  Identify/label emotions  Emotion regulation skills  Psychiatrist  Self care activities  Self-monitoring  Session Notes: Patient agrees a video Caregility session and understands the limitations of this platform. She is at home and I am at my home office.  Dx: Major Depression  Meds: Lexapro (20mg ), Busperone (7.5)  Goals: States that she is seeking counseling to learn to manage symptoms of depression and anxiety. Also has significant FOO conflicts with many boundary issues.  Will utilize insight oriented therapy, family systems therapy and behavioral strategies to manage symptoms. Goal date is 6-25 Session notes: Anne Hahn, PhD Time: 7:45a-8:30a 45 minutes

## 2023-01-05 ENCOUNTER — Ambulatory Visit: Payer: Medicare HMO | Admitting: Psychology

## 2023-01-05 DIAGNOSIS — F89 Unspecified disorder of psychological development: Secondary | ICD-10-CM

## 2023-01-05 NOTE — Progress Notes (Signed)
Date: 01/05/2023 Appointment Start Time: 11am Duration: 10 minutes Provider: Helmut Muster, PsyD Type of Session: Individual Therapy  Location of Patient: Home (private location) Location of Provider: Provider's Home (private office) Type of Contact: Caregility video visit with audio  Session Content: Today's appointment was a telepsychological visit due to COVID-19. [name] is aware it is her responsibility to secure confidentiality on her end of the session. She provided verbal consent to proceed with today's appointment. Prior to proceeding with today's appointment, [name]'s physical location at the time of this appointment was obtained as well a phone number she could be reached at in the event of technical difficulties. [name] is aware of the limitations of teleheath visits and verbally consented to proceed.  This provider conducted a check-in. [name] shared she was unable to complete the remaining measure due to technological issues. The measure was re-administered.   Plan: [name] will be scheduled for a feedback appointment upon completion of the remaining measure. She was agreeable with the plan and denied having any other issues or concerns.                 Kelly Gouge, PsyD

## 2023-01-17 ENCOUNTER — Ambulatory Visit: Payer: Medicare HMO | Admitting: Psychology

## 2023-01-21 ENCOUNTER — Telehealth: Payer: Self-pay | Admitting: Psychology

## 2023-01-21 NOTE — Telephone Encounter (Signed)
 This clinical research associate contacted [name] to check-in as she had not completed the remaining measure. She expressed a desire to continue the evaluation. This clinical research associate stated the measure would be resent but would expire in one week. She noted understanding and a plan to complete it within this time frame. This clinical research associate stated he would contact her to schedule the feedback appointment once the remaining measure was completed. She noted understanding.

## 2023-01-26 ENCOUNTER — Encounter: Payer: Self-pay | Admitting: *Deleted

## 2023-01-31 ENCOUNTER — Ambulatory Visit: Payer: Medicare HMO | Admitting: Psychology

## 2023-02-14 ENCOUNTER — Ambulatory Visit: Payer: Medicare HMO | Admitting: Psychology

## 2023-02-16 ENCOUNTER — Ambulatory Visit: Payer: Medicare HMO | Admitting: *Deleted

## 2023-02-16 VITALS — Ht 67.0 in | Wt 153.0 lb

## 2023-02-16 DIAGNOSIS — Z0000 Encounter for general adult medical examination without abnormal findings: Secondary | ICD-10-CM | POA: Diagnosis not present

## 2023-02-16 NOTE — Patient Instructions (Signed)
Ms. Kington , Thank you for taking time to come for your Medicare Wellness Visit. I appreciate your ongoing commitment to your health goals. Please review the following plan we discussed and let me know if I can assist you in the future.   Referrals/Orders/Follow-Ups/Clinician Recommendations: Remember to update your vaccines.  This is a list of the screening recommended for you and due dates:  Health Maintenance  Topic Date Due   COVID-19 Vaccine (1) Never done   Hepatitis C Screening  Never done   Colon Cancer Screening  Never done   Mammogram  08/15/2020   Flu Shot  04/18/2023*   Medicare Annual Wellness Visit  02/16/2024   Pap with HPV screening  05/07/2024   DTaP/Tdap/Td vaccine (2 - Td or Tdap) 12/23/2030   Pneumococcal Vaccination  Completed   HIV Screening  Completed   HPV Vaccine  Aged Out  *Topic was postponed. The date shown is not the original due date.    Advanced directives: (Declined) Advance directive discussed with you today. Even though you declined this today, please call our office should you change your mind, and we can give you the proper paperwork for you to fill out.  Next Medicare Annual Wellness Visit scheduled for next year: Yes 02/20/24 @ 8:10
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
## 2023-02-17 ENCOUNTER — Ambulatory Visit (INDEPENDENT_AMBULATORY_CARE_PROVIDER_SITE_OTHER): Payer: Medicare HMO | Admitting: Psychology

## 2023-02-17 DIAGNOSIS — F902 Attention-deficit hyperactivity disorder, combined type: Secondary | ICD-10-CM
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
## 2023-02-28 ENCOUNTER — Ambulatory Visit: Payer: Medicare HMO | Admitting: Psychology

## 2023-02-28 DIAGNOSIS — F329 Major depressive disorder, single episode, unspecified: Secondary | ICD-10-CM

## 2023-02-28 DIAGNOSIS — F902 Attention-deficit hyperactivity disorder, combined type: Secondary | ICD-10-CM

## 2023-02-28 NOTE — Progress Notes (Addendum)
[name] is a 48 y.o. female patient .  Diagnosis 296.30 (Major depressive affective disorder, recurrent episode, unspecified) [n/a]  Symptoms Depressed or irritable mood. (Status: maintained) -- No Description Entered  Diminished interest in or enjoyment of activities. (Status: maintained) -- No Description Entered  Feelings of hopelessness, worthlessness, or inappropriate guilt. (Status: maintained) -- No Description Entered  Lack of energy. (Status: maintained) -- No Description Entered  Unresolved grief issues. (Status: maintained) -- No Description Entered  Medication Status compliance  Safety none  If Suicidal or Homicidal State Action Taken: unspecified  Current Risk: low Medications Hydroxyzine (Dosage: unknown)  Remiron (Dosage: unknown)  Sertraline (Dosage: unknown)  Objectives Related Problem: Appropriately grieve the loss in order to normalize mood and to return to previously adaptive level of functioning. Description: Increasingly verbalize hopeful and positive statements regarding self, others, and the future. Target Date: 2023-01-14 Frequency: Daily Modality: individual Progress: 100%  Related Problem: Appropriately grieve the loss in order to normalize mood and to return to previously adaptive level of functioning. Description: Learn and implement conflict resolution skills to resolve interpersonal problems. Target Date: 2023-01-14 Frequency: Daily Modality: individual Progress: 100%  Related Problem: Appropriately grieve the loss in  order to normalize mood and to return to previously adaptive level of functioning. Description: Learn and implement problem-solving and decision-making skills. Target Date: 2023-01-14 Frequency: Daily Modality: individual Progress: 100%  Related Problem: Appropriately grieve the loss in order to normalize mood and to return to previously adaptive level of functioning. Description: Describe current and past experiences with depression including their impact on functioning and attempts to resolve it. Target Date: 2021-07-14 Frequency: Daily Modality: individual Progress: 100%-completed  Related Problem: Complete the process of letting go of the lost significant other. Description: Report decreased time spent each day focusing on the loss. Target Date: 2022-07-15 Frequency: Daily Modality: individual Progress: 100%  Related Problem: Complete the process of letting go of the lost significant other. Description: Decrease unrealistic thoughts, statements, and feelings of being responsible for the loss. Target Date: 2021-07-14 Frequency: Daily Modality: individual Progress: 100%-completed  Related Problem: Complete the process of letting go of the lost significant other. Description: Verbalize and resolve feelings of anger or guilt focused on self or deceased loved one that interfere with  the grieving process. Target Date: 2021-07-14 Frequency: Daily Modality: individual Progress: 100%-completed  Related Problem: Complete the process of letting go of the lost significant other. Description: Begin verbalizing feelings associated with the loss. Target Date: 2023-07-15 Frequency: Daily Modality: individual Progress: 90%  Related Problem: Complete the process of letting go of the lost significant other. Description: Identify what stages of grief have been experienced in the continuum of the grieving process. Target Date: 2021-07-14 Frequency: Daily Modality: individual Progress:  100%-completed  Related Problem: Complete the process of letting go of the lost significant other. Description: Participate in a therapy that addresses issues beyond grief that have arisen as a result of the loss. Target Date: 2023-07-15 Frequency: Daily Modality: individual Progress: 95%  Related Problem: Complete the process of letting go of the lost significant other. Description: Tell in detail the story of the current loss that is triggering symptoms. Target Date: 2021-07-14 Frequency: Daily Modality: individual Progress: 100%-completed  Client Response full compliance  Service Location Location, [address] [place], [place] [address]  Service Code cpt [phone]  Validate/empathize  Related past to present  Behavioral activation plan  Facilitate problem solving  Identify automatic thoughts  Rationally challenge thoughts or beliefs/cognitive restructuring  Identify/label emotions  Emotion regulation skills  Psychiatrist  Self care activities  Self-monitoring  Session Notes: Patient agrees a video Caregility session and understands the limitations of this platform. She is at home and I am at my home office.  Dx: Major Depression  Meds: Lexapro (20mg ), Busperone (7.5)  Goals: States that she is seeking counseling to learn to manage symptoms of depression and anxiety. Also has significant FOO conflicts with many boundary issues.  Will utilize insight oriented therapy, family systems therapy and behavioral strategies to manage symptoms. Goal date is 6-25 Session notes: [name] talked about feeling socially isolated. She is trying to eliminate the people in her life that are negative and not good for her. She did not do this in the past and realizes this is in her best interest. She says she no longer has a car and is now dependent on everyone else to get anywhere. She is very distressed that she no longer has an ability to be mobile and is getting little help or understanding  from her family. Her depression has worsened due to her circumstances. We talked about reaching out to the small network of people she can trust. We talked about the need to establish with a primary care doctor given her doctor is leaving. She has not received any medication to date for her ADHD.                                                                                           Garrel Ridgel, PhD Time: 7:35a-8:20a 45 minutes

## 2023-03-01 ENCOUNTER — Telehealth: Payer: Self-pay

## 2023-03-01 NOTE — Telephone Encounter (Signed)
She would need an appointment to discuss this and I would need a copy of the evaluation before prescribing medication for this. I can not access the notes from her behavioral health providers so she will need to have them send me the ADHD evaluation to review before her appointment. She could also discuss this with her new provider when she see's them in April.

## 2023-03-01 NOTE — Telephone Encounter (Signed)
Copied from CRM [id number]. Topic: Clinical - Medication Question >> Feb 28, 2023  5:03 PM Denese Killings wrote: Reason for CRM: Patient had an evaluation for ADHD with another Doctor named Jill Alexanders. Patient wants to know if she can be prescribed ADHD medication.

## 2023-03-01 NOTE — Telephone Encounter (Signed)
Spoke with patient in regards to previous call. Patient says she would print off a copy of the evaluation and mail it to the provider. Patient says she would get an appointment sooner after provider and office has received a copy.

## 2023-03-14 ENCOUNTER — Telehealth: Payer: Self-pay

## 2023-03-14 ENCOUNTER — Ambulatory Visit (INDEPENDENT_AMBULATORY_CARE_PROVIDER_SITE_OTHER): Payer: Medicare HMO | Admitting: Psychology

## 2023-03-14 DIAGNOSIS — F902 Attention-deficit hyperactivity disorder, combined type: Secondary | ICD-10-CM

## 2023-03-14 DIAGNOSIS — F329 Major depressive disorder, single episode, unspecified: Secondary | ICD-10-CM | POA: Diagnosis not present

## 2023-03-14 NOTE — Telephone Encounter (Signed)
 I have not received anything from her psychologist regarding the evaluation they did. She will need to have an in office visit to discuss the results of the evaluation and to discuss treatment options, though we will need to have the evaluation results prior to prescribing medication.

## 2023-03-14 NOTE — Progress Notes (Signed)
 [name] is a 48 y.o. female patient .  Diagnosis 296.30 (Major depressive affective disorder, recurrent episode, unspecified) [n/a]  Symptoms Depressed or irritable mood. (Status: maintained) -- No Description Entered  Diminished interest in or enjoyment of activities. (Status: maintained) -- No Description Entered  Feelings of hopelessness, worthlessness, or inappropriate guilt. (Status: maintained) -- No Description Entered  Lack of energy. (Status: maintained) -- No Description Entered  Unresolved grief issues. (Status: maintained) -- No Description Entered  Medication Status compliance  Safety none  If Suicidal or Homicidal State Action Taken: unspecified  Current Risk: low Medications Hydroxyzine (Dosage: unknown)  Remiron (Dosage: unknown)  Sertraline (Dosage: unknown)  Objectives Related Problem: Appropriately grieve the loss in order to normalize mood and to return to previously adaptive level of functioning. Description: Increasingly verbalize hopeful and positive statements regarding self, others, and the future. Target Date: 2023-01-14 Frequency: Daily Modality: individual Progress: 100%  Related Problem: Appropriately grieve the loss in order to normalize mood and to return to previously adaptive level of functioning. Description: Learn and implement conflict resolution skills to resolve interpersonal problems. Target Date: 2023-01-14 Frequency: Daily Modality: individual Progress: 100%  Related Problem: Appropriately grieve the loss in  order to normalize mood and to return to previously adaptive level of functioning. Description: Learn and implement problem-solving and decision-making skills. Target Date: 2023-01-14 Frequency: Daily Modality: individual Progress: 100%  Related Problem: Appropriately grieve the loss in order to normalize mood and to return to previously adaptive level of functioning. Description: Describe current and past experiences with depression including their impact on functioning and attempts to resolve it. Target Date: 2021-07-14 Frequency: Daily Modality: individual Progress: 100%-completed  Related Problem: Complete the process of letting go of the lost significant other. Description: Report decreased time spent each day focusing on the loss. Target Date: 2022-07-15 Frequency: Daily Modality: individual Progress: 100%  Related Problem: Complete the process of letting go of the lost significant other. Description: Decrease unrealistic thoughts, statements, and feelings of being responsible for the loss. Target Date: 2021-07-14 Frequency: Daily Modality: individual Progress: 100%-completed  Related Problem: Complete the process of letting go of the lost significant other. Description: Verbalize and resolve feelings of anger or guilt focused on self or deceased loved one that interfere with  the grieving process. Target Date: 2021-07-14 Frequency: Daily Modality: individual Progress: 100%-completed  Related Problem: Complete the process of letting go of the lost significant other. Description: Begin verbalizing feelings associated with the loss. Target Date: 2023-07-15 Frequency: Daily Modality: individual Progress: 90%  Related Problem: Complete the process of letting go of the lost significant other. Description: Identify what stages of grief have been experienced in the continuum of the grieving process. Target Date: 2021-07-14 Frequency: Daily Modality: individual Progress:  100%-completed  Related Problem: Complete the process of letting go of the lost significant other. Description: Participate in a therapy that addresses issues beyond grief that have arisen as a result of the loss. Target Date: 2023-07-15 Frequency: Daily Modality: individual Progress: 95%  Related Problem: Complete the process of letting go of the lost significant other. Description: Tell in detail the story of the current loss that is triggering symptoms. Target Date: 2021-07-14 Frequency: Daily Modality: individual Progress: 100%-completed  Client Response full compliance  Service Location Location, [address] [place], [place] [address]  Service Code cpt [phone]  Validate/empathize  Related past to present  Behavioral activation plan  Facilitate problem solving  Identify automatic thoughts  Rationally challenge thoughts or beliefs/cognitive restructuring  Identify/label emotions  Emotion regulation skills  Psychiatrist  Self care activities  Self-monitoring  Session Notes: Patient agrees a video Caregility session and understands the limitations of this platform. She is at home and I am at my home office.  Dx: Major Depression  Meds: Lexapro (20mg ), Busperone (7.5)  Goals: States that she is seeking counseling to learn to manage symptoms of depression and anxiety. Also has significant FOO conflicts with many boundary issues.  Will utilize insight oriented therapy, family systems therapy and behavioral strategies to manage symptoms. Goal date is 6-25 Session notes: [name] still has not gotten the medication. Explained how she can have records sent t her doctor so he can write her a prescription. She is determined that she is going to make it a good week, especially since she is turning 48 on Wednesday. She has no plans and says she cannot afford to do anything special. We talked about working to make her "special day" good and fulfilling. She has a very positive attitude  and is much less depressed. Is not very efficient but hoping medication will help.                                                                                            Garrel Ridgel, PhD Time: 7:40a-8:30a 50 minutes                    Garrel Ridgel, PhD

## 2023-03-14 NOTE — Telephone Encounter (Signed)
 Copied from CRM [id number]. Topic: Clinical - Medication Question >> Mar 14, 2023  1:08 PM Sonny Dandy B wrote: Reason for CRM: pt called  to speak with dr. Birdie Sons regarding an evaluation that was done by behavioral health. She states is was send to the provider and would like to know if he would review it and prescribe her some medication. Please call pt back [phone]

## 2023-03-14 NOTE — Telephone Encounter (Signed)
 Spoke with patient to make her aware of Dr Purvis Sheffield recommendations. Patient says she did the release of medical information at the last visit. Patient says she is going to follow up with the psychologist office to make sure they sent the requested paperwork. Patient says she will follow up with our office to get scheduled after she has confirmed we have received evaluation. Patient verbalized understanding and has no further questions at this time. FYI

## 2023-03-18 ENCOUNTER — Telehealth: Payer: Medicare HMO | Admitting: Physician Assistant

## 2023-03-18 DIAGNOSIS — B9689 Other specified bacterial agents as the cause of diseases classified elsewhere: Secondary | ICD-10-CM

## 2023-03-18 DIAGNOSIS — R051 Acute cough: Secondary | ICD-10-CM

## 2023-03-18 DIAGNOSIS — J019 Acute sinusitis, unspecified: Secondary | ICD-10-CM

## 2023-03-18 MED ORDER — BENZONATATE 100 MG PO CAPS: 100.0000 mg | ORAL_CAPSULE | Freq: Three times a day (TID) | ORAL | 0 refills | Status: DC | PRN

## 2023-03-18 MED ORDER — DOXYCYCLINE HYCLATE 100 MG PO TABS: 100.0000 mg | ORAL_TABLET | Freq: Two times a day (BID) | ORAL | 0 refills | Status: DC

## 2023-03-18 NOTE — Patient Instructions (Signed)
 Ruben Simona Huh, thank you for joining [name], [name] for today's virtual visit.  While this provider is not your primary care provider (PCP), if your PCP is located in our provider database this encounter information will be shared with them immediately following your visit.   A [facility] MyChart account gives you access to today's visit and all your visits, tests, and labs performed at [facility] " click here if you don't have a [facility] MyChart account or go to [email or web address]  Consent: (Patient) Chyrl Nahjae Hoeg provided verbal consent for this virtual visit at the beginning of the encounter.  Current Medications:  Current Outpatient Medications:    benzonatate (TESSALON) 100 MG capsule, Take 1-2 capsules (100-200 mg total) by mouth 3 (three) times daily as needed., Disp: 30 capsule, Rfl: 0   doxycycline (VIBRA-TABS) 100 MG tablet, Take 1 tablet (100 mg total) by mouth 2 (two) times daily., Disp: 20 tablet, Rfl: 0   albuterol (VENTOLIN HFA) 108 (90 Base) MCG/ACT inhaler, INHALE 2 PUFFS EVERY 6 HOURS AS NEEDED FOR WHEEZE OR SHORTNESS OF BREATH, Disp: 18 each, Rfl: 1   aspirin 325 MG tablet, Take 1 tablet (325 mg total) by mouth daily. (Patient not taking: Reported on 02/16/2023), Disp: 30 tablet, Rfl: 0   busPIRone (BUSPAR) 10 MG tablet, TAKE 1 TABLET BY MOUTH TWICE A DAY, Disp: 180 tablet, Rfl: 2   cetirizine (ZYRTEC ALLERGY) 10 MG tablet, Take 1 tablet (10 mg total) by mouth at bedtime., Disp: 30 tablet, Rfl: 2   escitalopram (LEXAPRO) 20 MG tablet, Take 1 tablet (20 mg total) by mouth daily., Disp: 90 tablet, Rfl: 0   fluticasone (FLONASE) 50 MCG/ACT nasal spray, Place 2 sprays into both nostrils daily., Disp: 16 g, Rfl: 0   hydrOXYzine (ATARAX) 10 MG tablet, Take 1 tablet (10 mg total) by mouth 3 (three) times daily as needed for anxiety., Disp: 30 tablet, Rfl: 0   ipratropium (ATROVENT) 0.03 % nasal spray, Place 2 sprays into both nostrils  every 12 (twelve) hours., Disp: 30 mL, Rfl: 12   ondansetron (ZOFRAN-ODT) 4 MG disintegrating tablet, Take 1 tablet (4 mg total) by mouth every 8 (eight) hours as needed., Disp: 20 tablet, Rfl: 0   polyethylene glycol powder (GLYCOLAX/MIRALAX) 17 GM/SCOOP powder, Take 17 g by mouth daily as needed for mild constipation., Disp: 500 g, Rfl: 0   Medications ordered in this encounter:  Meds ordered this encounter  Medications   doxycycline (VIBRA-TABS) 100 MG tablet    Sig: Take 1 tablet (100 mg total) by mouth 2 (two) times daily.    Dispense:  20 tablet    Refill:  0    Supervising Provider:   Merrilee Jansky [[id number]]   benzonatate (TESSALON) 100 MG capsule    Sig: Take 1-2 capsules (100-200 mg total) by mouth 3 (three) times daily as needed.    Dispense:  30 capsule    Refill:  0    Supervising Provider:   Merrilee Jansky [[id number]]     *If you need refills on other medications prior to your next appointment, please contact your pharmacy*  Follow-Up: Call back or seek an in-person evaluation if the symptoms worsen or if the condition fails to improve as anticipated.  [facility] [phone]  Other Instructions Sinus Infection, Adult A sinus infection, also called sinusitis, is inflammation of your sinuses. Sinuses are hollow spaces in the bones around your face. Your sinuses are located: Around your eyes.  In the middle of your forehead. Behind your nose. In your cheekbones. Mucus normally drains out of your sinuses. When your nasal tissues become inflamed or swollen, mucus can become trapped or blocked. This allows bacteria, viruses, and fungi to grow, which leads to infection. Most infections of the sinuses are caused by a virus. A sinus infection can develop quickly. It can last for up to 4 weeks (acute) or for more than 12 weeks (chronic). A sinus infection often develops after a cold. What are the causes? This condition is caused by anything that creates  swelling in the sinuses or stops mucus from draining. This includes: Allergies. Asthma. Infection from bacteria or viruses. Deformities or blockages in your nose or sinuses. Abnormal growths in the nose (nasal polyps). Pollutants, such as chemicals or irritants in the air. Infection from fungi. This is rare. What increases the risk? You are more likely to develop this condition if you: Have a weak body defense system (immune system). Do a lot of swimming or diving. Overuse nasal sprays. Smoke. What are the signs or symptoms? The main symptoms of this condition are pain and a feeling of pressure around the affected sinuses. Other symptoms include: Stuffy nose or congestion that makes it difficult to breathe through your nose. Thick yellow or greenish drainage from your nose. Tenderness, swelling, and warmth over the affected sinuses. A cough that may get worse at night. Decreased sense of smell and taste. Extra mucus that collects in the throat or the back of the nose (postnasal drip) causing a sore throat or bad breath. Tiredness (fatigue). Fever. How is this diagnosed? This condition is diagnosed based on: Your symptoms. Your medical history. A physical exam. Tests to find out if your condition is acute or chronic. This may include: Checking your nose for nasal polyps. Viewing your sinuses using a device that has a light (endoscope). Testing for allergies or bacteria. Imaging tests, such as an MRI or CT scan. In rare cases, a bone biopsy may be done to rule out more serious types of fungal sinus disease. How is this treated? Treatment for a sinus infection depends on the cause and whether your condition is chronic or acute. If caused by a virus, your symptoms should go away on their own within 10 days. You may be given medicines to relieve symptoms. They include: Medicines that shrink swollen nasal passages (decongestants). A spray that eases inflammation of the nostrils  (topical intranasal corticosteroids). Rinses that help get rid of thick mucus in your nose (nasal saline washes). Medicines that treat allergies (antihistamines). Over-the-counter pain relievers. If caused by bacteria, your health care provider may recommend waiting to see if your symptoms improve. Most bacterial infections will get better without antibiotic medicine. You may be given antibiotics if you have: A severe infection. A weak immune system. If caused by narrow nasal passages or nasal polyps, surgery may be needed. Follow these instructions at home: Medicines Take, use, or apply over-the-counter and prescription medicines only as told by your health care provider. These may include nasal sprays. If you were prescribed an antibiotic medicine, take it as told by your health care provider. Do not stop taking the antibiotic even if you start to feel better. Hydrate and humidify  Drink enough fluid to keep your urine pale yellow. Staying hydrated will help to thin your mucus. Use a cool mist humidifier to keep the humidity level in your home above 50%. Inhale steam for 10-15 minutes, 3-4 times a day, or as  told by your health care provider. You can do this in the bathroom while a hot shower is running. Limit your exposure to cool or dry air. Rest Rest as much as possible. Sleep with your head raised (elevated). Make sure you get enough sleep each night. General instructions  Apply a warm, moist washcloth to your face 3-4 times a day or as told by your health care provider. This will help with discomfort. Use nasal saline washes as often as told by your health care provider. Wash your hands often with soap and water to reduce your exposure to germs. If soap and water are not available, use hand sanitizer. Do not smoke. Avoid being around people who are smoking (secondhand smoke). Keep all follow-up visits. This is important. Contact a health care provider if: You have a fever. Your  symptoms get worse. Your symptoms do not improve within 10 days. Get help right away if: You have a severe headache. You have persistent vomiting. You have severe pain or swelling around your face or eyes. You have vision problems. You develop confusion. Your neck is stiff. You have trouble breathing. These symptoms may be an emergency. Get help right away. Call 911. Do not wait to see if the symptoms will go away. Do not drive yourself to the hospital. Summary A sinus infection is soreness and inflammation of your sinuses. Sinuses are hollow spaces in the bones around your face. This condition is caused by nasal tissues that become inflamed or swollen. The swelling traps or blocks the flow of mucus. This allows bacteria, viruses, and fungi to grow, which leads to infection. If you were prescribed an antibiotic medicine, take it as told by your health care provider. Do not stop taking the antibiotic even if you start to feel better. Keep all follow-up visits. This is important. This information is not intended to replace advice given to you by your health care provider. Make sure you discuss any questions you have with your health care provider. Document Revised: 12/09/2020 Document Reviewed: 12/09/2020 Elsevier Patient Education  2024 Elsevier Inc.   If you have been instructed to have an in-person evaluation today at a local Urgent Care facility, please use the link below. It will take you to a list of all of our available [facility], including address, phone number and hours of operation. Please do not delay care.  [facility]  If you or a family member do not have a primary care provider, use the link below to schedule a visit and establish care. When you choose a [facility] primary care physician or advanced practice provider, you gain a long-term partner in health. Find a Primary Care Provider  Learn more about [facility]'s in-office and virtual care  options: [facility] - Get Care Now

## 2023-03-18 NOTE — Progress Notes (Signed)
 Virtual Visit Consent   [name], you are scheduled for a virtual visit with a [facility] provider today. Just as with appointments in the office, your consent must be obtained to participate. Your consent will be active for this visit and any virtual visit you may have with one of our providers in the next 365 days. If you have a MyChart account, a copy of this consent can be sent to you electronically.  As this is a virtual visit, video technology does not allow for your provider to perform a traditional examination. This may limit your provider's ability to fully assess your condition. If your provider identifies any concerns that need to be evaluated in person or the need to arrange testing (such as labs, EKG, etc.), we will make arrangements to do so. Although advances in technology are sophisticated, we cannot ensure that it will always work on either your end or our end. If the connection with a video visit is poor, the visit may have to be switched to a telephone visit. With either a video or telephone visit, we are not always able to ensure that we have a secure connection.  By engaging in this virtual visit, you consent to the provision of healthcare and authorize for your insurance to be billed (if applicable) for the services provided during this visit. Depending on your insurance coverage, you may receive a charge related to this service.  I need to obtain your verbal consent now. Are you willing to proceed with your visit today? [name] has provided verbal consent on 03/18/2023 for a virtual visit (video or telephone). [name], [name]  Date: 03/18/2023 8:01 AM   Virtual Visit via Video Note   I, [name], connected with  [name]  ([id number], [birth year]) on 03/18/23 at  8:00 AM EST by a video-enabled telemedicine application and verified that I am speaking with the correct person using two identifiers.  Location: Patient:  Virtual Visit Location Patient: Home Provider: Virtual Visit Location Provider: Home Office   I discussed the limitations of evaluation and management by telemedicine and the availability of in person appointments. The patient expressed understanding and agreed to proceed.    History of Present Illness: [name] is a 48 y.o. who identifies as a female who was assigned female at birth, and is being seen today for worsening sinus congestion.  HPI: URI  This is a new problem. The current episode started yesterday. The problem has been gradually worsening. There has been no fever. Associated symptoms include congestion, coughing, headaches, rhinorrhea and sinus pain. Pertinent negatives include no diarrhea, ear pain, nausea, plugged ear sensation, sore throat, swollen glands, vomiting or wheezing. Associated symptoms comments: fatigue. Treatments tried: netipot, flonase, ipratropium bromide nasal spray. The treatment provided no relief.     Problems:  Patient Active Problem List   Diagnosis Date Noted   Major depressive disorder, recurrent episode, moderate (HCC) 06/07/2022   Respiratory illness 03/30/2022   Restless leg syndrome 03/30/2022   Right knee pain 03/12/2022   Septic joint of right knee joint (HCC) 03/12/2022   Asthma 03/12/2022   Hypokalemia 03/12/2022   Tobacco abuse 03/12/2022   Cellulitis of right lower extremity 03/12/2022   Pain and swelling of right knee 03/04/2022   Constipation 12/18/2021   Cervical cancer screening 05/08/2019   Breast cancer screening by mammogram 05/08/2019   Tennis elbow 11/27/2018   Positive urine drug screen 07/15/2018   Closed left ankle fracture  06/14/2018   Attention deficit 05/08/2018   Acute cough 02/15/2018   Family history of colon cancer 02/15/2018   Vitamin D deficiency 09/17/2015   Muscular dystrophy (HCC) 03/26/2015   Neuropathic pain 03/26/2015   Irregular periods 02/26/2015   Allergic rhinitis 02/26/2015   Tooth  pain 01/08/2015   Anxiety and depression 12/26/2014   Charcot-Marie-Tooth disease 12/26/2014   Stress incontinence 12/26/2014   Iron deficiency 12/26/2014   Current tobacco use 07/11/2013   Chronic pain 08/25/2012   Foot pain 08/25/2012   Other long term (current) drug therapy 09/30/2011   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   Arthritis, neuropathic 03/18/2011   Closed dislocation of tarsometatarsal joint 03/18/2011   Acquired deformities of toe 03/04/2011   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 (Patient not taking: Reported on 02/16/2023), Disp: 30 tablet, Rfl: 0   busPIRone (BUSPAR) 10 MG tablet, TAKE 1 TABLET BY MOUTH TWICE A DAY, Disp: 180 tablet, Rfl: 2   cetirizine (ZYRTEC ALLERGY) 10 MG tablet, Take 1 tablet (10 mg total) by mouth at bedtime., Disp: 30 tablet, Rfl: 2   escitalopram (LEXAPRO) 20 MG tablet, Take 1 tablet (20 mg total) by mouth daily., Disp: 90 tablet, Rfl: 0   fluticasone (FLONASE) 50 MCG/ACT nasal spray, Place 2 sprays into both nostrils daily., Disp: 16 g, Rfl: 0   hydrOXYzine (ATARAX) 10 MG tablet, Take 1 tablet (10 mg total) by mouth 3 (three) times daily as needed for anxiety., Disp: 30 tablet, Rfl: 0   ipratropium (ATROVENT) 0.03 % nasal spray, Place 2 sprays into both nostrils every 12 (twelve) hours., Disp: 30 mL, Rfl: 12   ondansetron (ZOFRAN-ODT) 4 MG  disintegrating tablet, Take 1 tablet (4 mg total) by mouth every 8 (eight) hours as needed., Disp: 20 tablet, Rfl: 0   polyethylene glycol powder (GLYCOLAX/MIRALAX) 17 GM/SCOOP powder, Take 17 g by mouth daily as needed for mild constipation., Disp: 500 g, Rfl: 0  Observations/Objective: Patient is well-developed, well-nourished in no acute distress.  Resting comfortably at home.  Head is normocephalic, atraumatic.  No labored breathing.  Speech is clear and coherent with logical content.  Patient is alert and oriented at baseline.    Assessment and Plan: There are no diagnoses linked to this encounter. - Acute worsening of symptoms on chronic sinusitis  - Will give Doxycycline - Continue allergy medications.  - Tessalon for cough as needed - Steam and humidifier can help - Continue nasal rinses - Stay well hydrated and get plenty of rest.  - Seek in person evaluation if no symptom improvement or if symptoms worsen   Follow Up Instructions: I discussed the assessment and treatment plan with the patient. The patient was provided an opportunity to ask questions and all were answered. The patient agreed with the plan and demonstrated an understanding of the instructions.  A copy of instructions were sent to the patient via MyChart unless otherwise noted below.    The patient was advised to call back or seek an in-person evaluation if the symptoms worsen or if the condition fails to improve as anticipated.    [name], [name]

## 2023-03-28 ENCOUNTER — Ambulatory Visit: Payer: Medicare HMO | Admitting: Psychology

## 2023-03-28 DIAGNOSIS — F339 Major depressive disorder, recurrent, unspecified: Secondary | ICD-10-CM | POA: Diagnosis not present

## 2023-03-28 DIAGNOSIS — F902 Attention-deficit hyperactivity disorder, combined type: Secondary | ICD-10-CM

## 2023-03-28 DIAGNOSIS — F329 Major depressive disorder, single episode, unspecified: Secondary | ICD-10-CM | POA: Insufficient documentation

## 2023-03-28 NOTE — Progress Notes (Signed)
 [name] is a 48 y.o. female patient .  Diagnosis 296.30 (Major depressive affective disorder, recurrent episode, unspecified) [n/a]  Symptoms Depressed or irritable mood. (Status: maintained) -- No Description Entered  Diminished interest in or enjoyment of activities. (Status: maintained) -- No Description Entered  Feelings of hopelessness, worthlessness, or inappropriate guilt. (Status: maintained) -- No Description Entered  Lack of energy. (Status: maintained) -- No Description Entered  Unresolved grief issues. (Status: maintained) -- No Description Entered  Medication Status compliance  Safety none  If Suicidal or Homicidal State Action Taken: unspecified  Current Risk: low Medications Hydroxyzine (Dosage: unknown)  Remiron (Dosage: unknown)  Sertraline (Dosage: unknown)  Objectives Related Problem: Appropriately grieve the loss in order to normalize mood and to return to previously adaptive level of functioning. Description: Increasingly verbalize hopeful and positive statements regarding self, others, and the future. Target Date: 2023-01-14 Frequency: Daily Modality: individual Progress: 100%  Related Problem: Appropriately grieve the loss in order to normalize mood and to return to previously adaptive level of functioning. Description: Learn and implement conflict resolution skills to resolve interpersonal problems. Target Date: 2023-01-14 Frequency: Daily Modality: individual Progress: 100%  Related Problem: Appropriately grieve the loss in order to normalize mood and to return to previously adaptive level of functioning. Description: Learn and implement problem-solving and decision-making skills. Target Date: 2023-01-14 Frequency: Daily Modality: individual Progress: 100%  Related Problem: Appropriately grieve the loss in order to normalize mood and to return to previously adaptive level of functioning. Description: Describe current and past  experiences with depression including their impact on functioning and attempts to resolve it. Target Date: 2021-07-14 Frequency: Daily Modality: individual Progress: 100%-completed  Related Problem: Complete the process of letting go of the lost significant other. Description: Report decreased time spent each day focusing on the loss. Target Date: 2022-07-15 Frequency: Daily Modality: individual Progress: 100%  Related Problem: Complete the process of letting go of the lost significant other. Description: Decrease unrealistic thoughts, statements, and feelings of being responsible for the loss. Target Date: 2021-07-14 Frequency: Daily Modality: individual Progress: 100%-completed  Related Problem: Complete the process of letting go of the lost significant other. Description: Verbalize and resolve feelings of anger or guilt focused on self or deceased loved one that interfere with the grieving process. Target Date: 2021-07-14 Frequency: Daily Modality: individual Progress: 100%-completed  Related Problem: Complete the process of letting go of the lost significant other. Description: Begin verbalizing feelings associated with the loss. Target Date: 2023-07-15 Frequency: Daily Modality: individual Progress: 90%  Related Problem: Complete the process of letting go of the lost significant other. Description: Identify what stages of grief have been experienced in the continuum of the grieving process. Target Date: 2021-07-14 Frequency: Daily Modality: individual Progress: 100%-completed  Related Problem: Complete the process of letting go of the lost significant other. Description: Participate in a therapy that addresses issues beyond grief that have arisen as a result of the loss. Target Date: 2023-07-15 Frequency: Daily Modality: individual Progress: 95%  Related Problem: Complete the process of letting go of the lost significant other. Description: Tell in detail the story of  the current loss that is triggering symptoms. Target Date: 2021-07-14 Frequency: Daily Modality: individual Progress: 100%-completed  Client Response full compliance  Service Location Location, [address] [place], [place] [address]  Service Code cpt [phone]  Validate/empathize  Related past to present  Behavioral activation plan  Facilitate problem solving  Identify automatic thoughts  Rationally challenge thoughts or beliefs/cognitive restructuring  Identify/label emotions  Emotion regulation skills  Psychiatrist  Self care activities  Self-monitoring  Session Notes: Patient agrees a Engineering geologist session and understands the limitations of this platform. She is at home and I am at my home office.  Dx: Major Depression  Meds: Lexapro (20mg ), Busperone (7.5)  Goals: States that she is seeking counseling to learn to manage symptoms of depression and anxiety. Also has significant FOO conflicts with many boundary issues.  Will utilize insight oriented therapy, family systems therapy and behavioral strategies to manage symptoms. Goal date is 6-25 Session notes: [name] says she has no joy in her life, except for time with her granddaughter. She is feeling more depressed lately and feels it is related to being "stuck home". She does not have a car and has to depend on so many other people. We talked about what option she has to get transportation. Also, the anniversary of her husban's death is approaching and that is always a factor contributing to depressive feelings. [name]'s progress toward self-reliance and independence has stalled and regressed. She is no longer persuing school and her confidence is diminished. We talked about establishing a plan to seek transportation and start to re-organize her schedule to improve self care and establish goals.                                                                                                  Garrel Ridgel, PhD Time:  7:45a-8:30a 45 minutes

## 2023-04-11 ENCOUNTER — Ambulatory Visit (INDEPENDENT_AMBULATORY_CARE_PROVIDER_SITE_OTHER): Payer: Medicare HMO | Admitting: Psychology

## 2023-04-11 DIAGNOSIS — F339 Major depressive disorder, recurrent, unspecified: Secondary | ICD-10-CM

## 2023-04-11 DIAGNOSIS — F902 Attention-deficit hyperactivity disorder, combined type: Secondary | ICD-10-CM

## 2023-04-11 NOTE — Progress Notes (Signed)
 Chinita Renarda Mullinix is a 48 y.o. female patient .  Diagnosis 296.30 (Major depressive affective disorder, recurrent episode, unspecified) [n/a]  Symptoms Depressed or irritable mood. (Status: maintained) -- No Description Entered  Diminished interest in or enjoyment of activities. (Status: maintained) -- No Description Entered  Feelings of hopelessness, worthlessness, or inappropriate guilt. (Status: maintained) -- No Description Entered  Lack of energy. (Status: maintained) -- No Description Entered  Unresolved grief issues. (Status: maintained) -- No Description Entered  Medication Status compliance  Safety none  If Suicidal or Homicidal State Action Taken: unspecified  Current Risk: low Medications Hydroxyzine (Dosage: unknown)  Remiron (Dosage: unknown)  Sertraline (Dosage: unknown)  Objectives Related Problem: Appropriately grieve the loss in order to normalize mood and to return to previously adaptive level of functioning. Description: Increasingly verbalize hopeful and positive statements regarding self, others, and the future. Target Date: 2023-01-14 Frequency: Daily Modality: individual Progress: 100%  Related Problem: Appropriately grieve the loss in order to normalize mood and to return to previously adaptive level of functioning. Description: Learn and implement conflict resolution skills to resolve interpersonal problems. Target Date: 2023-01-14 Frequency: Daily Modality: individual Progress: 100%  Related Problem: Appropriately grieve the loss in order to normalize mood and to return to previously adaptive level of functioning. Description: Learn and implement problem-solving and decision-making skills. Target Date: 2023-01-14 Frequency: Daily Modality: individual Progress: 100%  Related Problem: Appropriately grieve the loss in order to normalize mood and to return to previously adaptive level of functioning. Description:  Describe current and past experiences with depression including their impact on functioning and attempts to resolve it. Target Date: 2021-07-14 Frequency: Daily Modality: individual Progress: 100%-completed  Related Problem: Complete the process of letting go of the lost significant other. Description: Report decreased time spent each day focusing on the loss. Target Date: 2022-07-15 Frequency: Daily Modality: individual Progress: 100%  Related Problem: Complete the process of letting go of the lost significant other. Description: Decrease unrealistic thoughts, statements, and feelings of being responsible for the loss. Target Date: 2021-07-14 Frequency: Daily Modality: individual Progress: 100%-completed  Related Problem: Complete the process of letting go of the lost significant other. Description: Verbalize and resolve feelings of anger or guilt focused on self or deceased loved one that interfere with the grieving process. Target Date: 2021-07-14 Frequency: Daily Modality: individual Progress: 100%-completed  Related Problem: Complete the process of letting go of the lost significant other. Description: Begin verbalizing feelings associated with the loss. Target Date: 2023-07-15 Frequency: Daily Modality: individual Progress: 90%  Related Problem: Complete the process of letting go of the lost significant other. Description: Identify what stages of grief have been experienced in the continuum of the grieving process. Target Date: 2021-07-14 Frequency: Daily Modality: individual Progress: 100%-completed  Related Problem: Complete the process of letting go of the lost significant other. Description: Participate in a therapy that addresses issues beyond grief that have arisen as a result of the loss. Target Date: 2023-07-15 Frequency: Daily Modality: individual Progress: 95%  Related Problem: Complete the process of letting go of the lost significant other. Description:  Tell in detail the story of the current loss that is triggering symptoms. Target Date: 2021-07-14 Frequency: Daily Modality: individual Progress: 100%-completed  Client Response full compliance  Service Location Location, [address] [place], [place] [address]  Service Code cpt [phone]  Validate/empathize  Related past to present  Behavioral activation plan  Facilitate problem solving  Identify automatic thoughts  Rationally challenge thoughts or beliefs/cognitive restructuring  Identify/label emotions  Emotion regulation skills  Psychiatrist  Self care activities  Self-monitoring  Session Notes: Patient agrees a video Caregility session and understands the limitations of this platform. She is at home and I am at my home office.  Dx: Major Depression  Meds: Lexapro (20mg ), Busperone (7.5)  Goals: States that she is seeking counseling to learn to manage symptoms of depression and anxiety. Also has significant FOO conflicts with many boundary issues.  Will utilize insight oriented therapy, family systems therapy and behavioral strategies to manage symptoms. Goal date is 6-25 Session notes: Hilda Lias states that she is not feeling very good, as it is the anniversary of her husband's death. It has been on her mind all month and she replays all of the events surrounding his death. She says that there isn't any joy in her life and does not ever feel joy. She doesn't see her daughter very often, but they talk daily. She says she has no electricity because she doesn't have the money to pay the bill. Her parents will not lend her any money and she is stuck in a bad situation in that she has no other options. Hilda Lias says that her father has been angry with her because he is taking steroids for a medical condition, and is frequently agitated. She states she would like to work, but has no way to get to work. She will not ask daughter for help because she doesn't want to burden her. We talked about  ways to seek employment to get out of her situation.                                                                                                      Garrel Ridgel, PhD Time: 7:45a-8:30a 45 minutes

## 2023-04-25 ENCOUNTER — Ambulatory Visit (INDEPENDENT_AMBULATORY_CARE_PROVIDER_SITE_OTHER): Payer: Medicare HMO | Admitting: Psychology

## 2023-04-25 DIAGNOSIS — F339 Major depressive disorder, recurrent, unspecified: Secondary | ICD-10-CM

## 2023-04-25 DIAGNOSIS — F902 Attention-deficit hyperactivity disorder, combined type: Secondary | ICD-10-CM

## 2023-04-25 NOTE — Progress Notes (Signed)
 [name] is a 48 y.o. female patient .  Diagnosis 296.30 (Major depressive affective disorder, recurrent episode, unspecified) [n/a]  Symptoms Depressed or irritable mood. (Status: maintained) -- No Description Entered  Diminished interest in or enjoyment of activities. (Status: maintained) -- No Description Entered  Feelings of hopelessness, worthlessness, or inappropriate guilt. (Status: maintained) -- No Description Entered  Lack of energy. (Status: maintained) -- No Description Entered  Unresolved grief issues. (Status: maintained) -- No Description Entered  Medication Status compliance  Safety none  If Suicidal or Homicidal State Action Taken: unspecified  Current Risk: low Medications Hydroxyzine (Dosage: unknown)  Remiron (Dosage: unknown)  Sertraline (Dosage: unknown)  Objectives Related Problem: Appropriately grieve the loss in order to normalize mood and to return to previously adaptive level of functioning. Description: Increasingly verbalize hopeful and positive statements regarding self, others, and the future. Target Date: 2023-01-14 Frequency: Daily Modality: individual Progress: 100%  Related Problem: Appropriately grieve the loss in order to normalize mood and to return to previously adaptive level of functioning. Description: Learn and implement conflict resolution skills to resolve interpersonal problems. Target Date: 2023-01-14 Frequency: Daily Modality: individual Progress: 100%  Related Problem: Appropriately grieve the loss in order to normalize mood and to return to previously adaptive level of functioning. Description: Learn and implement problem-solving and decision-making skills. Target Date: 2023-01-14 Frequency: Daily Modality: individual Progress: 100%  Related Problem: Appropriately grieve the loss in order to normalize mood and to return to previously adaptive level of  functioning. Description: Describe current and past experiences with depression including their impact on functioning and attempts to resolve it. Target Date: 2021-07-14 Frequency: Daily Modality: individual Progress: 100%-completed  Related Problem: Complete the process of letting go of the lost significant other. Description: Report decreased time spent each day focusing on the loss. Target Date: 2022-07-15 Frequency: Daily Modality: individual Progress: 100%  Related Problem: Complete the process of letting go of the lost significant other. Description: Decrease unrealistic thoughts, statements, and feelings of being responsible for the loss. Target Date: 2021-07-14 Frequency: Daily Modality: individual Progress: 100%-completed  Related Problem: Complete the process of letting go of the lost significant other. Description: Verbalize and resolve feelings of anger or guilt focused on self or deceased loved one that interfere with the grieving process. Target Date: 2021-07-14 Frequency: Daily Modality: individual Progress: 100%-completed  Related Problem: Complete the process of letting go of the lost significant other. Description: Begin verbalizing feelings associated with the loss. Target Date: 2023-07-15 Frequency: Daily Modality: individual Progress: 90%  Related Problem: Complete the process of letting go of the lost significant other. Description: Identify what stages of grief have been experienced in the continuum of the grieving process. Target Date: 2021-07-14 Frequency: Daily Modality: individual Progress: 100%-completed  Related Problem: Complete the process of letting go of the lost significant other. Description: Participate in a therapy that addresses issues beyond grief that have arisen as a result of the loss. Target Date: 2023-07-15 Frequency: Daily Modality: individual Progress: 95%  Related Problem: Complete the process of letting go of the lost  significant other. Description: Tell in detail the story of the current loss that is triggering symptoms. Target Date: 2021-07-14 Frequency: Daily Modality: individual Progress: 100%-completed  Client Response full compliance  Service Location Location, [address] [place], [place] [address]  Service  Code cpt [id number]  Validate/empathize  Related past to present  Behavioral activation plan  Facilitate problem solving  Identify automatic thoughts  Rationally challenge thoughts or beliefs/cognitive restructuring  Identify/label emotions  Emotion regulation skills  Psychiatrist  Self care activities  Self-monitoring  Session Notes: Patient agrees a video Caregility session and understands the limitations of this platform. She is at home and I am at my home office.  Dx: Major Depression  Meds: Lexapro (20mg ), Busperone (7.5)  Goals: States that she is seeking counseling to learn to manage symptoms of depression and anxiety. Also has significant FOO conflicts with many boundary issues.  Will utilize insight oriented therapy, family systems therapy and behavioral strategies to manage symptoms. Goal date is 6-25 Session notes: [name] states that she finally got electricity. Also, she says her dad is going to consider selling her one of his older cars. She is more hopeful than she has been in the past several months. She hopes to get back to school and perhaps find a job. She does thinks "it's her time". Next session will focus on some specific plans moving forward with strategies to meet goals.                                                                                                         Garrel Ridgel, PhD Time: 7:45a-8:30a 45 minutes

## 2023-05-09 ENCOUNTER — Ambulatory Visit (INDEPENDENT_AMBULATORY_CARE_PROVIDER_SITE_OTHER): Payer: Medicare HMO | Admitting: Psychology

## 2023-05-09 DIAGNOSIS — F902 Attention-deficit hyperactivity disorder, combined type: Secondary | ICD-10-CM

## 2023-05-09 DIAGNOSIS — F339 Major depressive disorder, recurrent, unspecified: Secondary | ICD-10-CM

## 2023-05-09 NOTE — Progress Notes (Signed)
 [name] is a 48 y.o. female patient .  Diagnosis 296.30 (Major depressive affective disorder, recurrent episode, unspecified) [n/a]  Symptoms Depressed or irritable mood. (Status: maintained) -- No Description Entered  Diminished interest in or enjoyment of activities. (Status: maintained) -- No Description Entered  Feelings of hopelessness, worthlessness, or inappropriate guilt. (Status: maintained) -- No Description Entered  Lack of energy. (Status: maintained) -- No Description Entered  Unresolved grief issues. (Status: maintained) -- No Description Entered  Medication Status compliance  Safety none  If Suicidal or Homicidal State Action Taken: unspecified  Current Risk: low Medications Hydroxyzine  (Dosage: unknown)  Remiron (Dosage: unknown)  Sertraline  (Dosage: unknown)  Objectives Related Problem: Appropriately grieve the loss in order to normalize mood and to return to previously adaptive level of functioning. Description: Increasingly verbalize hopeful and positive statements regarding self, others, and the future. Target Date: 2023-01-14 Frequency: Daily Modality: individual Progress: 100%  Related Problem: Appropriately grieve the loss in order to normalize mood and to return to previously adaptive level of functioning. Description: Learn and implement conflict resolution skills to resolve interpersonal problems. Target Date: 2023-01-14 Frequency: Daily Modality: individual Progress: 100%  Related Problem: Appropriately grieve the loss in order to normalize mood and to return to previously adaptive level of functioning. Description: Learn and implement problem-solving and decision-making skills. Target Date: 2023-01-14 Frequency: Daily Modality: individual Progress: 100%  Related Problem: Appropriately grieve the loss in order to normalize mood and to return to  previously adaptive level of functioning. Description: Describe current and past experiences with depression including their impact on functioning and attempts to resolve it. Target Date: 2021-07-14 Frequency: Daily Modality: individual Progress: 100%-completed  Related Problem: Complete the process of letting go of the lost significant other. Description: Report decreased time spent each day focusing on the loss. Target Date: 2022-07-15 Frequency: Daily Modality: individual Progress: 100%  Related Problem: Complete the process of letting go of the lost significant other. Description: Decrease unrealistic thoughts, statements, and feelings of being responsible for the loss. Target Date: 2021-07-14 Frequency: Daily Modality: individual Progress: 100%-completed  Related Problem: Complete the process of letting go of the lost significant other. Description: Verbalize and resolve feelings of anger or guilt focused on self or deceased loved one that interfere with the grieving process. Target Date: 2021-07-14 Frequency: Daily Modality: individual Progress: 100%-completed  Related Problem: Complete the process of letting go of the lost significant other. Description: Begin verbalizing feelings associated with the loss. Target Date: 2023-07-15 Frequency: Daily Modality: individual Progress: 90%  Related Problem: Complete the process of letting go of the lost significant other. Description: Identify what stages of grief have been experienced in the continuum of the grieving process. Target Date: 2021-07-14 Frequency: Daily Modality: individual Progress: 100%-completed  Related Problem: Complete the process of letting go of the lost significant other. Description: Participate in a therapy that addresses issues beyond grief that have arisen as a result of the loss. Target Date: 2023-07-15 Frequency: Daily Modality: individual Progress: 95%  Related Problem: Complete the process of  letting go of the lost significant other. Description: Tell in detail the story of the current loss that is triggering symptoms. Target Date: 2021-07-14 Frequency: Daily Modality: individual Progress: 100%-completed  Client Response full  compliance  Service Location Location, [address] [place], [place] [address]  Service Code cpt [phone]  Validate/empathize  Related past to present  Behavioral activation plan  Facilitate problem solving  Identify automatic thoughts  Rationally challenge thoughts or beliefs/cognitive restructuring  Identify/label emotions  Emotion regulation skills  Psychiatrist  Self care activities  Self-monitoring  Session Notes: Patient agrees a video Caregility session and understands the limitations of this platform. She is at home and I am at my home office.  Dx: Major Depression  Meds: Lexapro  (20mg ), Busperone (7.5)  Goals: States that she is seeking counseling to learn to manage symptoms of depression and anxiety. Also has significant FOO conflicts with many boundary issues.  Will utilize insight oriented therapy, family systems therapy and behavioral strategies to manage symptoms. Goal date is 6-25 Session notes: [name] states that she is still having problems with hs landlord. Her father was brought into the situation by the landlord and she feels betrayed. She is working hard to protect herself which means she is having to isolate from [name]. Has significan financial limitations that interfere with her progress. Without transportation, she is unable to work toward greater independence. Only positive relationships in her life at this time, is with her daughter and her family, but she only sees them 1-2 times/month. Will start to develop a financial plan for us  to discuss at next session. She is having difficulty with focus but will meet with doctor soon and hopes to get ADHD meds.                                                                                                            Jola Nash, PhD Time: 7:45a-8:30a 45 minutes

## 2023-05-10 ENCOUNTER — Ambulatory Visit: Admitting: Physician Assistant

## 2023-05-13 ENCOUNTER — Telehealth: Admitting: Physician Assistant

## 2023-05-13 DIAGNOSIS — T3695XA Adverse effect of unspecified systemic antibiotic, initial encounter: Secondary | ICD-10-CM

## 2023-05-13 DIAGNOSIS — R110 Nausea: Secondary | ICD-10-CM | POA: Diagnosis not present

## 2023-05-13 DIAGNOSIS — R051 Acute cough: Secondary | ICD-10-CM | POA: Diagnosis not present

## 2023-05-13 DIAGNOSIS — B379 Candidiasis, unspecified: Secondary | ICD-10-CM

## 2023-05-13 DIAGNOSIS — J019 Acute sinusitis, unspecified: Secondary | ICD-10-CM

## 2023-05-13 DIAGNOSIS — B9689 Other specified bacterial agents as the cause of diseases classified elsewhere: Secondary | ICD-10-CM

## 2023-05-13 DIAGNOSIS — J302 Other seasonal allergic rhinitis: Secondary | ICD-10-CM

## 2023-05-13 MED ORDER — ONDANSETRON 4 MG PO TBDP: 4.0000 mg | ORAL_TABLET | Freq: Three times a day (TID) | ORAL | 0 refills | Status: AC | PRN

## 2023-05-13 MED ORDER — FLUCONAZOLE 150 MG PO TABS: 150.0000 mg | ORAL_TABLET | ORAL | 0 refills | Status: AC | PRN

## 2023-05-13 MED ORDER — DOXYCYCLINE HYCLATE 100 MG PO TABS: 100.0000 mg | ORAL_TABLET | Freq: Two times a day (BID) | ORAL | 0 refills | Status: DC

## 2023-05-13 MED ORDER — LORATADINE 10 MG PO TABS: 10.0000 mg | ORAL_TABLET | Freq: Every day | ORAL | 0 refills | Status: DC

## 2023-05-13 MED ORDER — BENZONATATE 100 MG PO CAPS: 100.0000 mg | ORAL_CAPSULE | Freq: Three times a day (TID) | ORAL | 0 refills | Status: DC | PRN

## 2023-05-13 NOTE — Patient Instructions (Signed)
 Tiffany Windle Hatch, thank you for joining Angelia Kelp, [name] for today's virtual visit.  While this provider is not your primary care provider (PCP), if your PCP is located in our provider database this encounter information will be shared with them immediately following your visit.   A [facility] MyChart account gives you access to today's visit and all your visits, tests, and labs performed at [facility] " click here if you don't have a [facility] MyChart account or go to [email or web address]  Consent: (Patient) Quana Aneshia Jacquet provided verbal consent for this virtual visit at the beginning of the encounter.  Current Medications:  Current Outpatient Medications:    benzonatate  (TESSALON ) 100 MG capsule, Take 1-2 capsules (100-200 mg total) by mouth 3 (three) times daily as needed., Disp: 30 capsule, Rfl: 0   doxycycline  (VIBRA -TABS) 100 MG tablet, Take 1 tablet (100 mg total) by mouth 2 (two) times daily., Disp: 20 tablet, Rfl: 0   fluconazole  (DIFLUCAN ) 150 MG tablet, Take 1 tablet (150 mg total) by mouth every 3 (three) days as needed., Disp: 3 tablet, Rfl: 0   ondansetron  (ZOFRAN -ODT) 4 MG disintegrating tablet, Take 1 tablet (4 mg total) by mouth every 8 (eight) hours as needed., Disp: 20 tablet, Rfl: 0   albuterol  (VENTOLIN  HFA) 108 (90 Base) MCG/ACT inhaler, INHALE 2 PUFFS EVERY 6 HOURS AS NEEDED FOR WHEEZE OR SHORTNESS OF BREATH, Disp: 18 each, Rfl: 1   aspirin  325 MG tablet, Take 1 tablet (325 mg total) by mouth daily. (Patient not taking: Reported on 02/16/2023), Disp: 30 tablet, Rfl: 0   busPIRone  (BUSPAR ) 10 MG tablet, TAKE 1 TABLET BY MOUTH TWICE A DAY, Disp: 180 tablet, Rfl: 2   cetirizine  (ZYRTEC  ALLERGY) 10 MG tablet, Take 1 tablet (10 mg total) by mouth at bedtime., Disp: 30 tablet, Rfl: 2   escitalopram  (LEXAPRO ) 20 MG tablet, Take 1 tablet (20 mg total) by mouth daily., Disp: 90 tablet, Rfl: 0   fluticasone  (FLONASE ) 50 MCG/ACT nasal spray,  Place 2 sprays into both nostrils daily., Disp: 16 g, Rfl: 0   hydrOXYzine  (ATARAX ) 10 MG tablet, Take 1 tablet (10 mg total) by mouth 3 (three) times daily as needed for anxiety., Disp: 30 tablet, Rfl: 0   ipratropium (ATROVENT ) 0.03 % nasal spray, Place 2 sprays into both nostrils every 12 (twelve) hours., Disp: 30 mL, Rfl: 12   polyethylene glycol powder (GLYCOLAX /MIRALAX ) 17 GM/SCOOP powder, Take 17 g by mouth daily as needed for mild constipation., Disp: 500 g, Rfl: 0   Medications ordered in this encounter:  Meds ordered this encounter  Medications   doxycycline  (VIBRA -TABS) 100 MG tablet    Sig: Take 1 tablet (100 mg total) by mouth 2 (two) times daily.    Dispense:  20 tablet    Refill:  0    Supervising Provider:   LAMPTEY, PHILIP O [[id number]]   benzonatate  (TESSALON ) 100 MG capsule    Sig: Take 1-2 capsules (100-200 mg total) by mouth 3 (three) times daily as needed.    Dispense:  30 capsule    Refill:  0    Supervising Provider:   LAMPTEY, PHILIP O [[id number]]   fluconazole  (DIFLUCAN ) 150 MG tablet    Sig: Take 1 tablet (150 mg total) by mouth every 3 (three) days as needed.    Dispense:  3 tablet    Refill:  0    Supervising Provider:   LAMPTEY, PHILIP O [[id number]]   ondansetron  (ZOFRAN -ODT) 4 MG disintegrating tablet  Sig: Take 1 tablet (4 mg total) by mouth every 8 (eight) hours as needed.    Dispense:  20 tablet    Refill:  0    Supervising Provider:   LAMPTEY, PHILIP O [[id number]]     *If you need refills on other medications prior to your next appointment, please contact your pharmacy*  Follow-Up: Call back or seek an in-person evaluation if the symptoms worsen or if the condition fails to improve as anticipated.  [facility] [phone]  Other Instructions  Sinus Infection, Adult A sinus infection, also called sinusitis, is inflammation of your sinuses. Sinuses are hollow spaces in the bones around your face. Your sinuses are located: Around your  eyes. In the middle of your forehead. Behind your nose. In your cheekbones. Mucus normally drains out of your sinuses. When your nasal tissues become inflamed or swollen, mucus can become trapped or blocked. This allows bacteria, viruses, and fungi to grow, which leads to infection. Most infections of the sinuses are caused by a virus. A sinus infection can develop quickly. It can last for up to 4 weeks (acute) or for more than 12 weeks (chronic). A sinus infection often develops after a cold. What are the causes? This condition is caused by anything that creates swelling in the sinuses or stops mucus from draining. This includes: Allergies. Asthma. Infection from bacteria or viruses. Deformities or blockages in your nose or sinuses. Abnormal growths in the nose (nasal polyps). Pollutants, such as chemicals or irritants in the air. Infection from fungi. This is rare. What increases the risk? You are more likely to develop this condition if you: Have a weak body defense system (immune system). Do a lot of swimming or diving. Overuse nasal sprays. Smoke. What are the signs or symptoms? The main symptoms of this condition are pain and a feeling of pressure around the affected sinuses. Other symptoms include: Stuffy nose or congestion that makes it difficult to breathe through your nose. Thick yellow or greenish drainage from your nose. Tenderness, swelling, and warmth over the affected sinuses. A cough that may get worse at night. Decreased sense of smell and taste. Extra mucus that collects in the throat or the back of the nose (postnasal drip) causing a sore throat or bad breath. Tiredness (fatigue). Fever. How is this diagnosed? This condition is diagnosed based on: Your symptoms. Your medical history. A physical exam. Tests to find out if your condition is acute or chronic. This may include: Checking your nose for nasal polyps. Viewing your sinuses using a device that has a  light (endoscope). Testing for allergies or bacteria. Imaging tests, such as an MRI or CT scan. In rare cases, a bone biopsy may be done to rule out more serious types of fungal sinus disease. How is this treated? Treatment for a sinus infection depends on the cause and whether your condition is chronic or acute. If caused by a virus, your symptoms should go away on their own within 10 days. You may be given medicines to relieve symptoms. They include: Medicines that shrink swollen nasal passages (decongestants). A spray that eases inflammation of the nostrils (topical intranasal corticosteroids). Rinses that help get rid of thick mucus in your nose (nasal saline washes). Medicines that treat allergies (antihistamines). Over-the-counter pain relievers. If caused by bacteria, your health care provider may recommend waiting to see if your symptoms improve. Most bacterial infections will get better without antibiotic medicine. You may be given antibiotics if you have:  A severe infection. A weak immune system. If caused by narrow nasal passages or nasal polyps, surgery may be needed. Follow these instructions at home: Medicines Take, use, or apply over-the-counter and prescription medicines only as told by your health care provider. These may include nasal sprays. If you were prescribed an antibiotic medicine, take it as told by your health care provider. Do not stop taking the antibiotic even if you start to feel better. Hydrate and humidify  Drink enough fluid to keep your urine pale yellow. Staying hydrated will help to thin your mucus. Use a cool mist humidifier to keep the humidity level in your home above 50%. Inhale steam for 10-15 minutes, 3-4 times a day, or as told by your health care provider. You can do this in the bathroom while a hot shower is running. Limit your exposure to cool or dry air. Rest Rest as much as possible. Sleep with your head raised (elevated). Make sure you  get enough sleep each night. General instructions  Apply a warm, moist washcloth to your face 3-4 times a day or as told by your health care provider. This will help with discomfort. Use nasal saline washes as often as told by your health care provider. Wash your hands often with soap and water to reduce your exposure to germs. If soap and water are not available, use hand sanitizer. Do not smoke. Avoid being around people who are smoking (secondhand smoke). Keep all follow-up visits. This is important. Contact a health care provider if: You have a fever. Your symptoms get worse. Your symptoms do not improve within 10 days. Get help right away if: You have a severe headache. You have persistent vomiting. You have severe pain or swelling around your face or eyes. You have vision problems. You develop confusion. Your neck is stiff. You have trouble breathing. These symptoms may be an emergency. Get help right away. Call 911. Do not wait to see if the symptoms will go away. Do not drive yourself to the hospital. Summary A sinus infection is soreness and inflammation of your sinuses. Sinuses are hollow spaces in the bones around your face. This condition is caused by nasal tissues that become inflamed or swollen. The swelling traps or blocks the flow of mucus. This allows bacteria, viruses, and fungi to grow, which leads to infection. If you were prescribed an antibiotic medicine, take it as told by your health care provider. Do not stop taking the antibiotic even if you start to feel better. Keep all follow-up visits. This is important. This information is not intended to replace advice given to you by your health care provider. Make sure you discuss any questions you have with your health care provider. Document Revised: 12/09/2020 Document Reviewed: 12/09/2020 Elsevier Patient Education  2024 Elsevier [employer]   If you have been instructed to have an in-person evaluation today at a local  Urgent Care facility, please use the link below. It will take you to a list of all of our available [facility], including address, phone number and hours of operation. Please do not delay care.  [facility]  If you or a family member do not have a primary care provider, use the link below to schedule a visit and establish care. When you choose a [facility] primary care physician or advanced practice provider, you gain a long-term partner in health. Find a Primary Care Provider  Learn more about [facility] in-office and virtual care options: [facility] - Get Care Now

## 2023-05-13 NOTE — Progress Notes (Signed)
 Virtual Visit Consent   [name], you are scheduled for a virtual visit with a [facility] provider today. Just as with appointments in the office, your consent must be obtained to participate. Your consent will be active for this visit and any virtual visit you may have with one of our providers in the next 365 days. If you have a MyChart account, a copy of this consent can be sent to you electronically.  As this is a virtual visit, video technology does not allow for your provider to perform a traditional examination. This may limit your provider's ability to fully assess your condition. If your provider identifies any concerns that need to be evaluated in person or the need to arrange testing (such as labs, EKG, etc.), we will make arrangements to do so. Although advances in technology are sophisticated, we cannot ensure that it will always work on either your end or our end. If the connection with a video visit is poor, the visit may have to be switched to a telephone visit. With either a video or telephone visit, we are not always able to ensure that we have a secure connection.  By engaging in this virtual visit, you consent to the provision of healthcare and authorize for your insurance to be billed (if applicable) for the services provided during this visit. Depending on your insurance coverage, you may receive a charge related to this service.  I need to obtain your verbal consent now. Are you willing to proceed with your visit today? [name] has provided verbal consent on 05/13/2023 for a virtual visit (video or telephone). Angelia Kelp, [name]  Date: 05/13/2023 1:01 PM   Virtual Visit via Video Note   I, Angelia Kelp, connected with  [name]  ([id number], 02-May-1975) on 05/13/23 at 12:45 PM EDT by a video-enabled telemedicine application and verified that I am speaking with the correct person using two identifiers.  Location: Patient:  Virtual Visit Location Patient: Home Provider: Virtual Visit Location Provider: Home Office   I discussed the limitations of evaluation and management by telemedicine and the availability of in person appointments. The patient expressed understanding and agreed to proceed.    History of Present Illness: [name] is a 48 y.o. who identifies as a female who was assigned female at birth, and is being seen today for sinus congestion.  HPI: Sinusitis This is a new problem. The current episode started in the past 7 days. The problem has been gradually worsening since onset. There has been no fever. The pain is moderate. Associated symptoms include chills, congestion, coughing, headaches and sinus pressure. Pertinent negatives include no diaphoresis, ear pain, hoarse voice, shortness of breath or sore throat. (Nausea, pressure worsens with bending forward, mild dizziness) Past treatments include saline nose sprays (neti pot, flonase , antihistamine, tylenol , ibuprofen). The treatment provided no relief.     Problems:  Patient Active Problem List   Diagnosis Date Noted   Major depression 03/28/2023   Major depressive disorder, recurrent episode, moderate (HCC) 06/07/2022   Respiratory illness 03/30/2022   Restless leg syndrome 03/30/2022   Right knee pain 03/12/2022   Septic joint of right knee joint (HCC) 03/12/2022   Asthma 03/12/2022   Hypokalemia 03/12/2022   Tobacco abuse 03/12/2022   Cellulitis of right lower extremity 03/12/2022   Pain and swelling of right knee 03/04/2022   Constipation 12/18/2021   Cervical cancer screening 05/08/2019   Breast cancer screening by mammogram 05/08/2019  Tennis elbow 11/27/2018   Positive urine drug screen 07/15/2018   Closed left ankle fracture 06/14/2018   Attention deficit 05/08/2018   Acute cough 02/15/2018   Family history of colon cancer 02/15/2018   Vitamin D  deficiency 09/17/2015   Muscular dystrophy (HCC) 03/26/2015    Neuropathic pain 03/26/2015   Irregular periods 02/26/2015   Allergic rhinitis 02/26/2015   Tooth pain 01/08/2015   Anxiety and depression 12/26/2014   Charcot-Marie-Tooth disease 12/26/2014   Stress incontinence 12/26/2014   Iron deficiency 12/26/2014   Current tobacco use 07/11/2013   Chronic pain 08/25/2012   Foot pain 08/25/2012   Other long term (current) drug therapy 09/30/2011   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   Arthritis, neuropathic 03/18/2011   Closed dislocation of tarsometatarsal joint 03/18/2011   Acquired deformities of toe 03/04/2011   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   Ulcer of right foot (HCC) 12/21/2010    Allergies:  Allergies  Allergen Reactions   Bee Venom Swelling   Vancomycin Shortness Of Breath   Penicillins     Other reaction(s): RASH   Sulfamethoxazole-Trimethoprim Itching   Tramadol Nausea Only    Other reaction(s): NAUSEA   Clindamycin  Nausea Only    And heartburn   Medications:  Current Outpatient Medications:    benzonatate  (TESSALON ) 100 MG capsule, Take 1-2 capsules (100-200 mg total) by mouth 3 (three) times daily as needed., Disp: 30 capsule, Rfl: 0   doxycycline  (VIBRA -TABS) 100 MG tablet, Take 1 tablet (100 mg total) by mouth 2 (two) times daily., Disp: 20 tablet, Rfl: 0   fluconazole  (DIFLUCAN ) 150 MG tablet, Take 1 tablet (150 mg total) by mouth every 3 (three) days as needed., Disp: 3 tablet, Rfl: 0   loratadine  (CLARITIN ) 10 MG tablet, Take 1 tablet (10 mg total) by mouth daily., Disp: 90 tablet, Rfl: 0   ondansetron  (ZOFRAN -ODT) 4 MG disintegrating tablet, Take 1 tablet (4 mg total) by mouth every 8 (eight) hours as needed., Disp: 20 tablet, Rfl: 0   albuterol  (VENTOLIN  HFA) 108 (90 Base) MCG/ACT inhaler, INHALE 2 PUFFS EVERY 6 HOURS AS NEEDED FOR WHEEZE OR SHORTNESS OF BREATH, Disp: 18 each, Rfl: 1   aspirin  325 MG tablet, Take 1 tablet (325 mg total) by mouth daily. (Patient not taking: Reported on  02/16/2023), Disp: 30 tablet, Rfl: 0   busPIRone  (BUSPAR ) 10 MG tablet, TAKE 1 TABLET BY MOUTH TWICE A DAY, Disp: 180 tablet, Rfl: 2   escitalopram  (LEXAPRO ) 20 MG tablet, Take 1 tablet (20 mg total) by mouth daily., Disp: 90 tablet, Rfl: 0   fluticasone  (FLONASE ) 50 MCG/ACT nasal spray, Place 2 sprays into both nostrils daily., Disp: 16 g, Rfl: 0   hydrOXYzine  (ATARAX ) 10 MG tablet, Take 1 tablet (10 mg total) by mouth 3 (three) times daily as needed for anxiety., Disp: 30 tablet, Rfl: 0   ipratropium (ATROVENT ) 0.03 % nasal spray, Place 2 sprays into both nostrils every 12 (twelve) hours., Disp: 30 mL, Rfl: 12   polyethylene glycol powder (GLYCOLAX /MIRALAX ) 17 GM/SCOOP powder, Take 17 g by mouth daily as needed for mild constipation., Disp: 500 g, Rfl: 0  Observations/Objective: Patient is well-developed, well-nourished in no acute distress.  Resting comfortably at home.  Head is normocephalic, atraumatic.  No labored breathing.  Speech is clear and coherent with logical content.  Patient is alert and oriented at baseline.    Assessment and Plan: 1. Acute bacterial sinusitis (Primary) - doxycycline  (VIBRA -TABS) 100 MG tablet; Take 1 tablet (100 mg total)  by mouth 2 (two) times daily.  Dispense: 20 tablet; Refill: 0  2. Acute cough - benzonatate  (TESSALON ) 100 MG capsule; Take 1-2 capsules (100-200 mg total) by mouth 3 (three) times daily as needed.  Dispense: 30 capsule; Refill: 0  3. Antibiotic-induced yeast infection - fluconazole  (DIFLUCAN ) 150 MG tablet; Take 1 tablet (150 mg total) by mouth every 3 (three) days as needed.  Dispense: 3 tablet; Refill: 0  4. Nausea - ondansetron  (ZOFRAN -ODT) 4 MG disintegrating tablet; Take 1 tablet (4 mg total) by mouth every 8 (eight) hours as needed.  Dispense: 20 tablet; Refill: 0  5. Seasonal allergies - loratadine  (CLARITIN ) 10 MG tablet; Take 1 tablet (10 mg total) by mouth daily.  Dispense: 90 tablet; Refill: 0  - Worsening symptoms that  have not responded to OTC medications.  - Will give Doxycycline  - Tessalon  for cough - Zofran  for nausea - Continue allergy medications.  - Steam and humidifier can help - Stay well hydrated and get plenty of rest. - Diflucan  given as prophylaxis as patient tends to get vaginal yeast infections with antibiotic use  - Seek in person evaluation if no symptom improvement or if symptoms worsen   Follow Up Instructions: I discussed the assessment and treatment plan with the patient. The patient was provided an opportunity to ask questions and all were answered. The patient agreed with the plan and demonstrated an understanding of the instructions.  A copy of instructions were sent to the patient via MyChart unless otherwise noted below.    The patient was advised to call back or seek an in-person evaluation if the symptoms worsen or if the condition fails to improve as anticipated.    Angelia Kelp, [name]

## 2023-05-17 ENCOUNTER — Encounter: Payer: Self-pay | Admitting: Nurse Practitioner

## 2023-05-17 ENCOUNTER — Ambulatory Visit (INDEPENDENT_AMBULATORY_CARE_PROVIDER_SITE_OTHER): Payer: Medicare HMO | Admitting: Nurse Practitioner

## 2023-05-17 VITALS — BP 90/60 | HR 79 | Temp 98.6°F | Ht 67.0 in | Wt 144.6 lb

## 2023-05-17 DIAGNOSIS — Z1211 Encounter for screening for malignant neoplasm of colon: Secondary | ICD-10-CM

## 2023-05-17 DIAGNOSIS — E785 Hyperlipidemia, unspecified: Secondary | ICD-10-CM

## 2023-05-17 DIAGNOSIS — F419 Anxiety disorder, unspecified: Secondary | ICD-10-CM

## 2023-05-17 DIAGNOSIS — Z716 Tobacco abuse counseling: Secondary | ICD-10-CM | POA: Diagnosis not present

## 2023-05-17 DIAGNOSIS — F32A Depression, unspecified: Secondary | ICD-10-CM | POA: Diagnosis not present

## 2023-05-17 DIAGNOSIS — Z1231 Encounter for screening mammogram for malignant neoplasm of breast: Secondary | ICD-10-CM | POA: Diagnosis not present

## 2023-05-17 DIAGNOSIS — Z13220 Encounter for screening for lipoid disorders: Secondary | ICD-10-CM

## 2023-05-17 DIAGNOSIS — E559 Vitamin D deficiency, unspecified: Secondary | ICD-10-CM | POA: Diagnosis not present

## 2023-05-17 DIAGNOSIS — Z122 Encounter for screening for malignant neoplasm of respiratory organs: Secondary | ICD-10-CM

## 2023-05-17 DIAGNOSIS — F988 Other specified behavioral and emotional disorders with onset usually occurring in childhood and adolescence: Secondary | ICD-10-CM

## 2023-05-17 MED ORDER — NICOTINE 7 MG/24HR TD PT24: 7.0000 mg | MEDICATED_PATCH | TRANSDERMAL | 2 refills | Status: AC

## 2023-05-17 MED ORDER — BUSPIRONE HCL 15 MG PO TABS
15.0000 mg | ORAL_TABLET | Freq: Two times a day (BID) | ORAL | 1 refills | Status: AC
Start: 2023-05-17 — End: ?

## 2023-05-17 MED ORDER — ESCITALOPRAM OXALATE 20 MG PO TABS: 20.0000 mg | ORAL_TABLET | Freq: Every day | ORAL | 0 refills | Status: AC
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
## 2023-05-17 NOTE — Patient Instructions (Signed)
 YOUR MAMMOGRAM IS DUE, PLEASE CALL AND GET THIS SCHEDULED! [facility] - call [phone]

## 2023-05-23 ENCOUNTER — Ambulatory Visit (INDEPENDENT_AMBULATORY_CARE_PROVIDER_SITE_OTHER): Payer: Medicare HMO | Admitting: Psychology

## 2023-05-23 DIAGNOSIS — F902 Attention-deficit hyperactivity disorder, combined type: Secondary | ICD-10-CM

## 2023-05-23 DIAGNOSIS — F339 Major depressive disorder, recurrent, unspecified: Secondary | ICD-10-CM | POA: Diagnosis not present
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
## 2023-05-30 DIAGNOSIS — F988 Other specified behavioral and emotional disorders with onset usually occurring in childhood and adolescence: Secondary | ICD-10-CM | POA: Insufficient documentation

## 2023-05-30 DIAGNOSIS — Z716 Tobacco abuse counseling: Secondary | ICD-10-CM | POA: Insufficient documentation

## 2023-05-30 NOTE — Assessment & Plan Note (Signed)
 Nicotine  dependence with smoking one pack per day for 25 years. Interested in quitting and open to nicotine  replacement therapy. - Prescribe nicotine  patches.

## 2023-05-30 NOTE — Assessment & Plan Note (Signed)
 Current medication includes Lexapro  20 mg and Buspar  10 mg. Symptoms not fully controlled. Discussed increasing Buspar . - Increase Buspar  to 15 mg. - Monitor response to increased Buspar  dosage.

## 2023-05-30 NOTE — Assessment & Plan Note (Signed)
 ADD diagnosed with symptoms of difficulty focusing and managing tasks. Awaiting notes from psychologist for further evaluation.  - Review ADHD notes from psychologist.

## 2023-06-02 DIAGNOSIS — Z1211 Encounter for screening for malignant neoplasm of colon: Secondary | ICD-10-CM | POA: Diagnosis not present

## 2023-06-06 ENCOUNTER — Ambulatory Visit: Admitting: Psychology

## 2023-06-06 NOTE — Progress Notes (Unsigned)
 [name] is a 48 y.o. female patient .  Diagnosis 296.30 (Major depressive affective disorder, recurrent episode, unspecified) [n/a]  Symptoms Depressed or irritable mood. (Status: maintained) -- No Description Entered  Diminished interest in or enjoyment of activities. (Status: maintained) -- No Description Entered  Feelings of hopelessness, worthlessness, or inappropriate guilt. (Status: maintained) -- No Description Entered  Lack of energy. (Status: maintained) -- No Description Entered  Unresolved grief issues. (Status: maintained) -- No Description Entered  Medication Status compliance  Safety none  If Suicidal or Homicidal State Action Taken: unspecified  Current Risk: low Medications Hydroxyzine  (Dosage: unknown)  Remiron (Dosage: unknown)  Sertraline  (Dosage: unknown)  Objectives Related Problem: Appropriately grieve the loss in order to normalize mood and to return to previously adaptive level of functioning. Description: Increasingly verbalize hopeful and positive statements regarding self, others, and the future. Target Date: 2023-01-14 Frequency: Daily Modality: individual Progress: 100%  Related Problem: Appropriately grieve the loss in order to normalize mood and to return to previously adaptive level of functioning. Description: Learn and implement conflict resolution skills to resolve interpersonal problems. Target Date: 2023-01-14 Frequency: Daily Modality: individual Progress: 100%  Related Problem: Appropriately grieve the loss in order to normalize mood and to return to previously adaptive level of functioning. Description: Learn and implement problem-solving and decision-making skills. Target Date: 2023-01-14 Frequency: Daily Modality: individual Progress: 100%  Related Problem: Appropriately  grieve the loss in order to normalize mood and to return to previously adaptive level of functioning. Description: Describe current and past experiences with depression including their impact on functioning and attempts to resolve it. Target Date: 2021-07-14 Frequency: Daily Modality: individual Progress: 100%-completed  Related Problem: Complete the process of letting go of the lost significant other. Description: Report decreased time spent each day focusing on the loss. Target Date: 2022-07-15 Frequency: Daily Modality: individual Progress: 100%  Related Problem: Complete the process of letting go of the lost significant other. Description: Decrease unrealistic thoughts, statements, and feelings of being responsible for the loss. Target Date: 2021-07-14 Frequency: Daily Modality: individual Progress: 100%-completed  Related Problem: Complete the process of letting go of the lost significant other. Description: Verbalize and resolve feelings of anger or guilt focused on self or deceased loved one that interfere with the grieving process. Target Date: 2021-07-14 Frequency: Daily Modality: individual Progress: 100%-completed  Related Problem: Complete the process of letting go of the lost significant other. Description: Begin verbalizing feelings associated with the loss. Target Date: 2023-07-15 Frequency: Daily Modality: individual Progress: 90%  Related Problem: Complete the process of letting go of the lost significant other. Description: Identify what stages of grief have been experienced in the continuum of the grieving process. Target Date: 2021-07-14 Frequency: Daily Modality: individual Progress: 100%-completed  Related Problem: Complete the process of letting go of the lost significant other. Description: Participate in a therapy that addresses issues beyond grief that have arisen as a result of the loss. Target Date: 2023-07-15 Frequency: Daily Modality:  individual Progress: 95%  Related Problem: Complete the process of letting go of the  lost significant other. Description: Tell in detail the story of the current loss that is triggering symptoms. Target Date: 2021-07-14 Frequency: Daily Modality: individual Progress: 100%-completed  Client Response full compliance  Service Location Location, [address] [place], [place] [address]  Service Code cpt [phone]  Validate/empathize  Related past to present  Behavioral activation plan  Facilitate problem solving  Identify automatic thoughts  Rationally challenge thoughts or beliefs/cognitive restructuring  Identify/label emotions  Emotion regulation skills  Psychiatrist  Self care activities  Self-monitoring  Session Notes: Patient agrees a video Caregility session and understands the limitations of this platform. She is at home and I am at my home office.  Dx: Major Depression  Meds: Lexapro  (20mg ), Busperone (7.5)  Goals: States that she is seeking counseling to learn to manage symptoms of depression and anxiety. Also has significant FOO conflicts with many boundary issues.  Will utilize insight oriented therapy, family systems therapy and behavioral strategies to manage symptoms. Goal date is 6-25 Session notes: Arsenio Bigger, PhD Time: 9:40a-10:30a 50 minutes

## 2023-06-11 LAB — COLOGUARD: COLOGUARD: NEGATIVE

## 2023-06-20 ENCOUNTER — Ambulatory Visit (INDEPENDENT_AMBULATORY_CARE_PROVIDER_SITE_OTHER): Payer: Medicare HMO | Admitting: Psychology

## 2023-06-20 DIAGNOSIS — F339 Major depressive disorder, recurrent, unspecified: Secondary | ICD-10-CM | POA: Diagnosis not present

## 2023-06-20 DIAGNOSIS — F902 Attention-deficit hyperactivity disorder, combined type: Secondary | ICD-10-CM | POA: Diagnosis not present

## 2023-06-20 NOTE — Progress Notes (Signed)
 [name] is a 48 y.o. female patient .  Diagnosis 296.30 (Major depressive affective disorder, recurrent episode, unspecified) [n/a]  Symptoms Depressed or irritable mood. (Status: maintained) -- No Description Entered  Diminished interest in or enjoyment of activities. (Status: maintained) -- No Description Entered  Feelings of hopelessness, worthlessness, or inappropriate guilt. (Status: maintained) -- No Description Entered  Lack of energy. (Status: maintained) -- No Description Entered  Unresolved grief issues. (Status: maintained) -- No Description Entered  Medication Status compliance  Safety none  If Suicidal or Homicidal State Action Taken: unspecified  Current Risk: low Medications Hydroxyzine  (Dosage: unknown)  Remiron (Dosage: unknown)  Sertraline  (Dosage: unknown)  Objectives Related Problem: Appropriately grieve the loss in order to normalize mood and to return to previously adaptive level of functioning. Description: Increasingly verbalize hopeful and positive statements regarding self, others, and the future. Target Date: 2023-01-14 Frequency: Daily Modality: individual Progress: 100%  Related Problem: Appropriately grieve the loss in order to normalize mood and to return to previously adaptive level of functioning. Description: Learn and implement conflict resolution skills to resolve interpersonal problems. Target Date: 2023-01-14 Frequency: Daily Modality: individual Progress: 100%  Related Problem: Appropriately grieve the loss in order to normalize mood and to return to previously adaptive level of functioning. Description: Learn and implement problem-solving and decision-making skills. Target Date: 2023-01-14 Frequency: Daily Modality: individual Progress: 100%   Related Problem: Appropriately grieve the loss in order to normalize mood and to return to previously adaptive level of functioning. Description: Describe current and past experiences with depression including their impact on functioning and attempts to resolve it. Target Date: 2021-07-14 Frequency: Daily Modality: individual Progress: 100%-completed  Related Problem: Complete the process of letting go of the lost significant other. Description: Report decreased time spent each day focusing on the loss. Target Date: 2022-07-15 Frequency: Daily Modality: individual Progress: 100%  Related Problem: Complete the process of letting go of the lost significant other. Description: Decrease unrealistic thoughts, statements, and feelings of being responsible for the loss. Target Date: 2021-07-14 Frequency: Daily Modality: individual Progress: 100%-completed  Related Problem: Complete the process of letting go of the lost significant other. Description: Verbalize and resolve feelings of anger or guilt focused on self or deceased loved one that interfere with the grieving process. Target Date: 2021-07-14 Frequency: Daily Modality: individual Progress: 100%-completed  Related Problem: Complete the process of letting go of the lost significant other. Description: Begin verbalizing feelings associated with the loss. Target Date: 2023-07-15 Frequency: Daily Modality: individual Progress: 90%  Related Problem: Complete the process of letting go of the lost significant other. Description: Identify what stages of grief have been experienced in the continuum of the grieving process. Target Date: 2021-07-14 Frequency: Daily Modality: individual Progress: 100%-completed  Related Problem: Complete the process of letting go of the lost significant other. Description: Participate in a therapy that addresses issues beyond grief that have arisen as a result of the loss. Target Date:  2023-07-15 Frequency: Daily  Modality: individual Progress: 95%  Related Problem: Complete the process of letting go of the lost significant other. Description: Tell in detail the story of the current loss that is triggering symptoms. Target Date: 2021-07-14 Frequency: Daily Modality: individual Progress: 100%-completed  Client Response full compliance  Service Location Location, [address] [place], [place] [address]  Service Code cpt [phone]  Validate/empathize  Related past to present  Behavioral activation plan  Facilitate problem solving  Identify automatic thoughts  Rationally challenge thoughts or beliefs/cognitive restructuring  Identify/label emotions  Emotion regulation skills  Psychiatrist  Self care activities  Self-monitoring  Session Notes: Patient agrees a video Caregility session and understands the limitations of this platform. She is at home and I am at my home office.  Dx: Major Depression  Meds: Lexapro  (20mg ), Busperone (7.5)  Goals/Plan: States that she is seeking counseling to learn to manage symptoms of depression and anxiety. Also has significant FOO conflicts with many boundary issues.  Will utilize insight oriented therapy, family systems therapy and behavioral strategies to manage symptoms. Goal date is 12-25 Session notes: [name] continues to have problems with her home and landlord. We talked about how this stress fosters her insecurity dependence on parents. She says she is "working on" being more assertive ans standing up for herself. She discussed the pain she feels by her parents ongoing support of her brother while dismissing her material and emotional needs. This replicates what she has experienced her whole life. She continues, however to try and point out her struggles, only to feel rejection and disappointment.  Discussed plan to help her achieve greater autonomy. Will work on getting information about acquiring a car.                                                                                                                    Jola Nash, PhD Time: 8:40a-9:30a 50 minutes

## 2023-07-04 ENCOUNTER — Ambulatory Visit (INDEPENDENT_AMBULATORY_CARE_PROVIDER_SITE_OTHER): Admitting: Psychology

## 2023-07-04 DIAGNOSIS — F902 Attention-deficit hyperactivity disorder, combined type: Secondary | ICD-10-CM

## 2023-07-04 DIAGNOSIS — F339 Major depressive disorder, recurrent, unspecified: Secondary | ICD-10-CM | POA: Diagnosis not present

## 2023-07-04 NOTE — Progress Notes (Signed)
 [name] is a 48 y.o. female patient .  Diagnosis 296.30 (Major depressive affective disorder, recurrent episode, unspecified) [n/a]  Symptoms Depressed or irritable mood. (Status: maintained) -- No Description Entered  Diminished interest in or enjoyment of activities. (Status: maintained) -- No Description Entered  Feelings of hopelessness, worthlessness, or inappropriate guilt. (Status: maintained) -- No Description Entered  Lack of energy. (Status: maintained) -- No Description Entered  Unresolved grief issues. (Status: maintained) -- No Description Entered  Medication Status compliance  Safety none  If Suicidal or Homicidal State Action Taken: unspecified  Current Risk: low Medications Hydroxyzine  (Dosage: unknown)  Remiron (Dosage: unknown)  Sertraline  (Dosage: unknown)  Objectives Related Problem: Appropriately grieve the loss in order to normalize mood and to return to previously adaptive level of functioning. Description: Increasingly verbalize hopeful and positive statements regarding self, others, and the future. Target Date: 2023-01-14 Frequency: Daily Modality: individual Progress: 100%  Related Problem: Appropriately grieve the loss in order to normalize mood and to return to previously adaptive level of functioning. Description: Learn and implement conflict resolution skills to resolve interpersonal problems. Target Date: 2023-01-14 Frequency: Daily Modality: individual Progress: 100%  Related Problem: Appropriately grieve the loss in order to normalize mood and to return to previously adaptive level of functioning. Description: Learn and implement problem-solving and decision-making skills. Target Date: 2023-01-14 Frequency: Daily Modality:  individual Progress: 100%  Related Problem: Appropriately grieve the loss in order to normalize mood and to return to previously adaptive level of functioning. Description: Describe current and past experiences with depression including their impact on functioning and attempts to resolve it. Target Date: 2021-07-14 Frequency: Daily Modality: individual Progress: 100%-completed  Related Problem: Complete the process of letting go of the lost significant other. Description: Report decreased time spent each day focusing on the loss. Target Date: 2022-07-15 Frequency: Daily Modality: individual Progress: 100%  Related Problem: Complete the process of letting go of the lost significant other. Description: Decrease unrealistic thoughts, statements, and feelings of being responsible for the loss. Target Date: 2021-07-14 Frequency: Daily Modality: individual Progress: 100%-completed  Related Problem: Complete the process of letting go of the lost significant other. Description: Verbalize and resolve feelings of anger or guilt focused on self or deceased loved one that interfere with the grieving process. Target Date: 2021-07-14 Frequency: Daily Modality: individual Progress: 100%-completed  Related Problem: Complete the process of letting go of the lost significant other. Description: Begin verbalizing feelings associated with the loss. Target Date: 2023-07-15 Frequency: Daily Modality: individual Progress: 90%  Related Problem: Complete the process of letting go of the lost significant other. Description: Identify what stages of grief have been experienced in the continuum of the grieving process. Target Date: 2021-07-14 Frequency: Daily Modality: individual Progress: 100%-completed  Related Problem: Complete the process of letting go of the lost significant other. Description: Participate in a therapy that addresses issues beyond grief  that have arisen as a result of the  loss. Target Date: 2023-07-15 Frequency: Daily Modality: individual Progress: 95%  Related Problem: Complete the process of letting go of the lost significant other. Description: Tell in detail the story of the current loss that is triggering symptoms. Target Date: 2021-07-14 Frequency: Daily Modality: individual Progress: 100%-completed  Client Response full compliance  Service Location Location, [address] [place], [place] [address]  Service Code cpt [phone]  Validate/empathize  Related past to present  Behavioral activation plan  Facilitate problem solving  Identify automatic thoughts  Rationally challenge thoughts or beliefs/cognitive restructuring  Identify/label emotions  Emotion regulation skills  Psychiatrist  Self care activities  Self-monitoring  Session Notes: Patient agrees a video Caregility session and understands the limitations of this platform. She is at home and I am at my home office.  Dx: Major Depression  Meds: Lexapro  (20mg ), Busperone (7.5)  Goals/Plan: States that she is seeking counseling to learn to manage symptoms of depression and anxiety. Also has significant FOO conflicts with many boundary issues.  Will utilize insight oriented therapy, family systems therapy and behavioral strategies to manage symptoms. Goal date is 12-25 Session notes: [name] did not have water in her home for 2 weeks. Her landlord took his time. She feels she has little recourse to complain, fearing retribution. She talked about the lack of support from father who tends to take the side of everyone else. She is also thinking that her mom plays a more significant role in the rejection she feels from family. She is writing her mother a letter to express her feelings about how she is treated. Read letter and we reviewed and edited. Also talked about possible reactions and how to respond. [name] still gets scattered and disorganized. Discussed strategy to organize her days and  priorities.                                                                                                                      Jola Nash, PhD Time: 8:00a-8:30a 50 minutes

## 2023-07-18 ENCOUNTER — Ambulatory Visit: Admitting: Psychology

## 2023-08-01 ENCOUNTER — Ambulatory Visit (INDEPENDENT_AMBULATORY_CARE_PROVIDER_SITE_OTHER): Admitting: Psychology

## 2023-08-01 DIAGNOSIS — F339 Major depressive disorder, recurrent, unspecified: Secondary | ICD-10-CM | POA: Diagnosis not present

## 2023-08-01 DIAGNOSIS — F902 Attention-deficit hyperactivity disorder, combined type: Secondary | ICD-10-CM

## 2023-08-01 NOTE — Progress Notes (Signed)
 [name] is a 48 y.o. female patient .  Diagnosis 296.30 (Major depressive affective disorder, recurrent episode, unspecified) [n/a]  Symptoms Depressed or irritable mood. (Status: maintained) -- No Description Entered  Diminished interest in or enjoyment of activities. (Status: maintained) -- No Description Entered  Feelings of hopelessness, worthlessness, or inappropriate guilt. (Status: maintained) -- No Description Entered  Lack of energy. (Status: maintained) -- No Description Entered  Unresolved grief issues. (Status: maintained) -- No Description Entered  Medication Status compliance  Safety none  If Suicidal or Homicidal State Action Taken: unspecified  Current Risk: low Medications Hydroxyzine  (Dosage: unknown)  Remiron (Dosage: unknown)  Sertraline  (Dosage: unknown)  Objectives Related Problem: Appropriately grieve the loss in order to normalize mood and to return to previously adaptive level of functioning. Description: Increasingly verbalize hopeful and positive statements regarding self, others, and the future. Target Date: 2023-01-14 Frequency: Daily Modality: individual Progress: 100%  Related Problem: Appropriately grieve the loss in order to normalize mood and to return to previously adaptive level of functioning. Description: Learn and implement conflict resolution skills to resolve interpersonal problems. Target Date: 2023-01-14 Frequency: Daily Modality: individual Progress: 100%  Related Problem: Appropriately grieve the loss in order to normalize mood and to return to previously adaptive level of functioning. Description: Learn and implement problem-solving and decision-making skills. Target Date:  2023-01-14 Frequency: Daily Modality: individual Progress: 100%  Related Problem: Appropriately grieve the loss in order to normalize mood and to return to previously adaptive level of functioning. Description: Describe current and past experiences with depression including their impact on functioning and attempts to resolve it. Target Date: 2021-07-14 Frequency: Daily Modality: individual Progress: 100%-completed  Related Problem: Complete the process of letting go of the lost significant other. Description: Report decreased time spent each day focusing on the loss. Target Date: 2022-07-15 Frequency: Daily Modality: individual Progress: 100%  Related Problem: Complete the process of letting go of the lost significant other. Description: Decrease unrealistic thoughts, statements, and feelings of being responsible for the loss. Target Date: 2021-07-14 Frequency: Daily Modality: individual Progress: 100%-completed  Related Problem: Complete the process of letting go of the lost significant other. Description: Verbalize and resolve feelings of anger or guilt focused on self or deceased loved one that interfere with the grieving process. Target Date: 2021-07-14 Frequency: Daily Modality: individual Progress: 100%-completed  Related Problem: Complete the process of letting go of the lost significant other. Description: Begin verbalizing feelings associated with the loss. Target Date: 2023-07-15 Frequency: Daily Modality: individual Progress: 90%  Related Problem: Complete the process of letting go of the lost significant other. Description: Identify what stages of grief have been experienced in the continuum of the grieving process. Target Date: 2021-07-14 Frequency: Daily Modality: individual Progress: 100%-completed  Related Problem: Complete the process of letting go  of the lost significant other. Description: Participate in a therapy that addresses issues beyond grief that  have arisen as a result of the loss. Target Date: 2023-07-15 Frequency: Daily Modality: individual Progress: 95%  Related Problem: Complete the process of letting go of the lost significant other. Description: Tell in detail the story of the current loss that is triggering symptoms. Target Date: 2021-07-14 Frequency: Daily Modality: individual Progress: 100%-completed  Client Response full compliance  Service Location Location, [address] [place], [place] [address]  Service Code cpt [phone]  Validate/empathize  Related past to present  Behavioral activation plan  Facilitate problem solving  Identify automatic thoughts  Rationally challenge thoughts or beliefs/cognitive restructuring  Identify/label emotions  Emotion regulation skills  Psychiatrist  Self care activities  Self-monitoring  Session Notes: Patient agrees a video Caregility session and understands the limitations of this platform. She is at home and I am at my home office.  Dx: Major Depression  Meds: Lexapro  (20mg ), Busperone (7.5)  Goals/Plan: States that she is seeking counseling to learn to manage symptoms of depression and anxiety. Also has significant FOO conflicts with many boundary issues.  Will utilize insight oriented therapy, family systems therapy and behavioral strategies to manage symptoms. Goal date is 12-25 Session notes: [name] says she is doing okay. Her brother had brain surgery and is now recovering at home. It was not cancer and he will now have some rehab. She says that she still doesn't have hot water and ac isn't working well. She feels she has no options because needs to be near parents since she doesn't have a car. [name] says she spends most of her time helping other people. These are mostly neighbors. We talked about the need to improve self care and focus on her physical and emotional well-being.                                                                                                                            CONI ALM KERNS, PhD Time: 7:40a-8:30a 50 minutes

## 2023-08-15 ENCOUNTER — Ambulatory Visit: Admitting: Psychology

## 2023-08-15 DIAGNOSIS — F902 Attention-deficit hyperactivity disorder, combined type: Secondary | ICD-10-CM | POA: Diagnosis not present

## 2023-08-15 DIAGNOSIS — F339 Major depressive disorder, recurrent, unspecified: Secondary | ICD-10-CM | POA: Diagnosis not present

## 2023-08-15 NOTE — Progress Notes (Signed)
 [name] is a 48 y.o. female patient .  Diagnosis 296.30 (Major depressive affective disorder, recurrent episode, unspecified) [n/a]  Symptoms Depressed or irritable mood. (Status: maintained) -- No Description Entered  Diminished interest in or enjoyment of activities. (Status: maintained) -- No Description Entered  Feelings of hopelessness, worthlessness, or inappropriate guilt. (Status: maintained) -- No Description Entered  Lack of energy. (Status: maintained) -- No Description Entered  Unresolved grief issues. (Status: maintained) -- No Description Entered  Medication Status compliance  Safety none  If Suicidal or Homicidal State Action Taken: unspecified  Current Risk: low Medications Hydroxyzine  (Dosage: unknown)  Remiron (Dosage: unknown)  Sertraline  (Dosage: unknown)  Objectives Related Problem: Appropriately grieve the loss in order to normalize mood and to return to previously adaptive level of functioning. Description: Increasingly verbalize hopeful and positive statements regarding self, others, and the future. Target Date: 2023-01-14 Frequency: Daily Modality: individual Progress: 100%  Related Problem: Appropriately grieve the loss in order to normalize mood and to return to previously adaptive level of functioning. Description: Learn and implement conflict resolution skills to resolve interpersonal problems. Target Date: 2023-01-14 Frequency: Daily Modality: individual Progress: 100%  Related Problem: Appropriately grieve the loss in order to normalize mood and to return to previously adaptive level of functioning. Description: Learn and implement problem-solving and decision-making  skills. Target Date: 2023-01-14 Frequency: Daily Modality: individual Progress: 100%  Related Problem: Appropriately grieve the loss in order to normalize mood and to return to previously adaptive level of functioning. Description: Describe current and past experiences with depression including their impact on functioning and attempts to resolve it. Target Date: 2021-07-14 Frequency: Daily Modality: individual Progress: 100%-completed  Related Problem: Complete the process of letting go of the lost significant other. Description: Report decreased time spent each day focusing on the loss. Target Date: 2022-07-15 Frequency: Daily Modality: individual Progress: 100%  Related Problem: Complete the process of letting go of the lost significant other. Description: Decrease unrealistic thoughts, statements, and feelings of being responsible for the loss. Target Date: 2021-07-14 Frequency: Daily Modality: individual Progress: 100%-completed  Related Problem: Complete the process of letting go of the lost significant other. Description: Verbalize and resolve feelings of anger or guilt focused on self or deceased loved one that interfere with the grieving process. Target Date: 2021-07-14 Frequency: Daily Modality: individual Progress: 100%-completed  Related Problem: Complete the process of letting go of the lost significant other. Description: Begin verbalizing feelings associated with the loss. Target Date: 2023-07-15 Frequency: Daily Modality: individual Progress: 90%  Related Problem: Complete the process of letting go of the lost significant other. Description: Identify what stages of grief have been experienced in the continuum of the grieving process. Target Date: 2021-07-14  Frequency: Daily Modality: individual Progress: 100%-completed  Related Problem: Complete the process of letting go of the lost significant other. Description: Participate in a therapy that addresses  issues beyond grief that have arisen as a result of the loss. Target Date: 2023-07-15 Frequency: Daily Modality: individual Progress: 95%  Related Problem: Complete the process of letting go of the lost significant other. Description: Tell in detail the story of the current loss that is triggering symptoms. Target Date: 2021-07-14 Frequency: Daily Modality: individual Progress: 100%-completed  Client Response full compliance  Service Location Location, [address] [place], [place] [address]  Service Code cpt [phone]  Validate/empathize  Related past to present  Behavioral activation plan  Facilitate problem solving  Identify automatic thoughts  Rationally challenge thoughts or beliefs/cognitive restructuring  Identify/label emotions  Emotion regulation skills  Psychiatrist  Self care activities  Self-monitoring  Session Notes: Patient agrees a video Caregility session and understands the limitations of this platform. She is at home and I am at my home office.  Dx: Major Depression  Meds: Lexapro  (20mg ), Busperone (7.5)  Goals/Plan: States that she is seeking counseling to learn to manage symptoms of depression and anxiety. Also has significant FOO conflicts with many boundary issues.  Will utilize insight oriented therapy, family systems therapy and behavioral strategies to manage symptoms. Goal date is 12-25 Session notes: [name] is still without hot water. She cannot get landlord to respond. Will write letter and withhold rent. She states she is okay lately. She has been talking more with brother who just had brain surgery. He is recovering okay. She is feeling trapped because she doesn't have transportation. This also keeps her from getting a job, so she feels stuck. We talked about things she can do to help her moods. Agreed to call daughter and accept offer to stay with her a few days. [name] has many circumstances contributing to her feeling powerless and needs to work  toward engaging in activities that support her independence.                                                                                                                               CONI ALM KERNS, PhD Time: 9:40a-10:30a 50 minutes

## 2023-08-29 ENCOUNTER — Ambulatory Visit: Admitting: Psychology

## 2023-09-09 ENCOUNTER — Telehealth: Admitting: Physician Assistant

## 2023-09-09 DIAGNOSIS — J329 Chronic sinusitis, unspecified: Secondary | ICD-10-CM | POA: Diagnosis not present

## 2023-09-09 MED ORDER — DOXYCYCLINE HYCLATE 100 MG PO CAPS: 100.0000 mg | ORAL_CAPSULE | Freq: Two times a day (BID) | ORAL | 0 refills | Status: AC

## 2023-09-09 NOTE — Patient Instructions (Signed)
 Ralyn [name] Mani, thank you for joining [name], [name] for today's virtual visit.  While this provider is not your primary care provider (PCP), if your PCP is located in our provider database this encounter information will be shared with them immediately following your visit.   A [facility] MyChart account gives you access to today's visit and all your visits, tests, and labs performed at [facility]  click here if you don't have a [facility] MyChart account or go to [email or web address]  Consent: (Patient) [name] provided verbal consent for this virtual visit at the beginning of the encounter.  Current Medications:  Current Outpatient Medications:    albuterol  (VENTOLIN  HFA) 108 (90 Base) MCG/ACT inhaler, INHALE 2 PUFFS EVERY 6 HOURS AS NEEDED FOR WHEEZE OR SHORTNESS OF BREATH, Disp: 18 each, Rfl: 1   aspirin  325 MG tablet, Take 1 tablet (325 mg total) by mouth daily., Disp: 30 tablet, Rfl: 0   busPIRone  (BUSPAR ) 15 MG tablet, Take 1 tablet (15 mg total) by mouth 2 (two) times daily., Disp: 90 tablet, Rfl: 1   doxycycline  (VIBRA -TABS) 100 MG tablet, Take 1 tablet (100 mg total) by mouth 2 (two) times daily., Disp: 20 tablet, Rfl: 0   escitalopram  (LEXAPRO ) 20 MG tablet, Take 1 tablet (20 mg total) by mouth daily., Disp: 90 tablet, Rfl: 0   fluconazole  (DIFLUCAN ) 150 MG tablet, Take 1 tablet (150 mg total) by mouth every 3 (three) days as needed., Disp: 3 tablet, Rfl: 0   fluticasone  (FLONASE ) 50 MCG/ACT nasal spray, Place 2 sprays into both nostrils daily., Disp: 16 g, Rfl: 0   nicotine  (NICODERM CQ ) 7 mg/24hr patch, Place 1 patch (7 mg total) onto the skin daily., Disp: 28 patch, Rfl: 2   ondansetron  (ZOFRAN -ODT) 4 MG disintegrating tablet, Take 1 tablet (4 mg total) by mouth every 8 (eight) hours as needed., Disp: 20 tablet, Rfl: 0   polyethylene glycol powder (GLYCOLAX /MIRALAX ) 17 GM/SCOOP powder, Take 17 g by mouth daily as needed for mild  constipation., Disp: 500 g, Rfl: 0   Medications ordered in this encounter:  No orders of the defined types were placed in this encounter.    *If you need refills on other medications prior to your next appointment, please contact your pharmacy*  Follow-Up: Call back or seek an in-person evaluation if the symptoms worsen or if the condition fails to improve as anticipated.  [facility] [phone]  Other Instructions Take fluticasone  as directed   You were given a prescription for antibiotics. Please monitor symptoms and if symptoms worsen or fail to improve.   Follow up with your regular doctor in 1 week for reassessment and seek care sooner if your symptoms worsen or fail to improve.    If you have been instructed to have an in-person evaluation today at a local Urgent Care facility, please use the link below. It will take you to a list of all of our available [facility], including address, phone number and hours of operation. Please do not delay care.  [facility]  If you or a family member do not have a primary care provider, use the link below to schedule a visit and establish care. When you choose a [facility] primary care physician or advanced practice provider, you gain a long-term partner in health. Find a Primary Care Provider  Learn more about [facility]'s in-office and virtual care options: [facility] - Get Care Now
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
## 2023-09-12 ENCOUNTER — Ambulatory Visit: Admitting: Psychology

## 2023-09-12 DIAGNOSIS — F339 Major depressive disorder, recurrent, unspecified: Secondary | ICD-10-CM | POA: Diagnosis not present

## 2023-09-12 DIAGNOSIS — F902 Attention-deficit hyperactivity disorder, combined type: Secondary | ICD-10-CM
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
## 2023-09-26 ENCOUNTER — Ambulatory Visit (INDEPENDENT_AMBULATORY_CARE_PROVIDER_SITE_OTHER): Admitting: Psychology

## 2023-09-26 DIAGNOSIS — F902 Attention-deficit hyperactivity disorder, combined type: Secondary | ICD-10-CM | POA: Diagnosis not present

## 2023-09-26 DIAGNOSIS — F339 Major depressive disorder, recurrent, unspecified: Secondary | ICD-10-CM
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
## 2023-10-10 ENCOUNTER — Ambulatory Visit: Admitting: Psychology

## 2023-11-07 ENCOUNTER — Ambulatory Visit: Admitting: Psychology

## 2023-11-27 ENCOUNTER — Telehealth: Admitting: Physician Assistant

## 2023-11-27 DIAGNOSIS — J302 Other seasonal allergic rhinitis: Secondary | ICD-10-CM | POA: Diagnosis not present

## 2023-11-27 DIAGNOSIS — J309 Allergic rhinitis, unspecified: Secondary | ICD-10-CM | POA: Diagnosis not present

## 2023-11-27 MED ORDER — LEVOCETIRIZINE DIHYDROCHLORIDE 5 MG PO TABS: 5.0000 mg | ORAL_TABLET | Freq: Every evening | ORAL | 0 refills | Status: AC

## 2023-11-27 MED ORDER — AZELASTINE HCL 0.1 % NA SOLN: 2.0000 | Freq: Two times a day (BID) | NASAL | 12 refills | Status: AC

## 2023-11-27 NOTE — Progress Notes (Signed)
 Virtual Visit Consent   [name], you are scheduled for a virtual visit with a [facility] provider today. Just as with appointments in the office, your consent must be obtained to participate. Your consent will be active for this visit and any virtual visit you may have with one of our providers in the next 365 days. If you have a MyChart account, a copy of this consent can be sent to you electronically.  As this is a virtual visit, video technology does not allow for your provider to perform a traditional examination. This may limit your provider's ability to fully assess your condition. If your provider identifies any concerns that need to be evaluated in person or the need to arrange testing (such as labs, EKG, etc.), we will make arrangements to do so. Although advances in technology are sophisticated, we cannot ensure that it will always work on either your end or our end. If the connection with a video visit is poor, the visit may have to be switched to a telephone visit. With either a video or telephone visit, we are not always able to ensure that we have a secure connection.  By engaging in this virtual visit, you consent to the provision of healthcare and authorize for your insurance to be billed (if applicable) for the services provided during this visit. Depending on your insurance coverage, you may receive a charge related to this service.  I need to obtain your verbal consent now. Are you willing to proceed with your visit today? [name] has provided verbal consent on 11/27/2023 for a virtual visit (video or telephone). Teena Shuck, [place]  Date: 11/27/2023 2:24 PM   Virtual Visit via Video Note   I, Teena Shuck, connected with  [name]  ([id number], 01/11/1976) on 11/27/23 at  1:30 PM EST by a video-enabled telemedicine application and verified that I am speaking with the correct person using two identifiers.  Location: Patient: Virtual Visit  Location Patient: Home Provider: Virtual Visit Location Provider: Home Office   I discussed the limitations of evaluation and management by telemedicine and the availability of in person appointments. The patient expressed understanding and agreed to proceed.    History of Present Illness: [name] is a 48 y.o. who identifies as a female who was assigned female at birth, and is being seen today for seasonal allergies.  HPI: Sinus Problem This is a chronic problem. The current episode started yesterday. The problem is unchanged. There has been no fever. Associated symptoms include congestion and headaches. Past treatments include nothing. The treatment provided no relief.    Problems:  Patient Active Problem List   Diagnosis Date Noted   Attention deficit disorder 05/30/2023   Tobacco abuse counseling 05/30/2023   Major depression 03/28/2023   Major depressive disorder, recurrent episode, moderate (HCC) 06/07/2022   Respiratory illness 03/30/2022   Restless leg syndrome 03/30/2022   Right knee pain 03/12/2022   Septic joint of right knee joint (HCC) 03/12/2022   Asthma 03/12/2022   Hypokalemia 03/12/2022   Tobacco abuse 03/12/2022   Cellulitis of right lower extremity 03/12/2022   Pain and swelling of right knee 03/04/2022   Constipation 12/18/2021   Cervical cancer screening 05/08/2019   Breast cancer screening by mammogram 05/08/2019   Tennis elbow 11/27/2018   Positive urine drug screen 07/15/2018   Closed left ankle fracture 06/14/2018   Attention deficit 05/08/2018   Acute cough 02/15/2018   Family history of colon cancer 02/15/2018  Vitamin D  deficiency 09/17/2015   Muscular dystrophy (HCC) 03/26/2015   Neuropathic pain 03/26/2015   Irregular periods 02/26/2015   Allergic rhinitis 02/26/2015   Tooth pain 01/08/2015   Anxiety and depression 12/26/2014   Charcot-Marie-Tooth disease 12/26/2014   Stress incontinence 12/26/2014   Iron deficiency 12/26/2014    Current tobacco use 07/11/2013   Chronic pain 08/25/2012   Foot pain 08/25/2012   Other long term (current) drug therapy 09/30/2011   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   Arthritis, neuropathic 03/18/2011   Closed dislocation of tarsometatarsal joint 03/18/2011   Acquired deformities of toe 03/04/2011   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   Ulcer of right foot (HCC) 12/21/2010    Allergies:  Allergies  Allergen Reactions   Bee Venom Swelling   Vancomycin Shortness Of Breath   Penicillins     Other reaction(s): RASH   Sulfamethoxazole-Trimethoprim Itching   Tramadol Nausea Only    Other reaction(s): NAUSEA   Clindamycin  Nausea Only    And heartburn   Medications:  Current Outpatient Medications:    albuterol  (VENTOLIN  HFA) 108 (90 Base) MCG/ACT inhaler, INHALE 2 PUFFS EVERY 6 HOURS AS NEEDED FOR WHEEZE OR SHORTNESS OF BREATH, Disp: 18 each, Rfl: 1   aspirin  325 MG tablet, Take 1 tablet (325 mg total) by mouth daily., Disp: 30 tablet, Rfl: 0   busPIRone  (BUSPAR ) 15 MG tablet, Take 1 tablet (15 mg total) by mouth 2 (two) times daily., Disp: 90 tablet, Rfl: 1   doxycycline  (VIBRA -TABS) 100 MG tablet, Take 1 tablet (100 mg total) by mouth 2 (two) times daily., Disp: 20 tablet, Rfl: 0   escitalopram  (LEXAPRO ) 20 MG tablet, Take 1 tablet (20 mg total) by mouth daily., Disp: 90 tablet, Rfl: 0   fluconazole  (DIFLUCAN ) 150 MG tablet, Take 1 tablet (150 mg total) by mouth every 3 (three) days as needed., Disp: 3 tablet, Rfl: 0   fluticasone  (FLONASE ) 50 MCG/ACT nasal spray, Place 2 sprays into both nostrils daily., Disp: 16 g, Rfl: 0   nicotine  (NICODERM CQ ) 7 mg/24hr patch, Place 1 patch (7 mg total) onto the skin daily., Disp: 28 patch, Rfl: 2   ondansetron  (ZOFRAN -ODT) 4 MG disintegrating tablet, Take 1 tablet (4 mg total) by mouth every 8 (eight) hours as needed., Disp: 20 tablet, Rfl: 0   polyethylene glycol powder (GLYCOLAX /MIRALAX ) 17 GM/SCOOP powder, Take 17 g by mouth  daily as needed for mild constipation., Disp: 500 g, Rfl: 0  Observations/Objective: Patient is well-developed, well-nourished in no acute distress.  Resting comfortably  at home.  Head is normocephalic, atraumatic.  No labored breathing.  Speech is clear and coherent with logical content.  Patient is alert and oriented at baseline.    Assessment and Plan: 1. Seasonal allergies (Primary)  Patient presenting with  allergic rhinitis. Differentials include allergic rhinitis, COVID,  bacterial pneumonia, URI, sinusitis. Do not suspect underlying cardiopulmonary process. I considered, but think unlikely, dangerous causes of this patient's symptoms to include ACS, CHF or pneumonia, pneumothorax. Patient is nontoxic and not in need of emergent medical intervention. Plan: reassurance, reassessment, discharge with PCP follow-up  Follow Up Instructions: I discussed the assessment and treatment plan with the patient. The patient was provided an opportunity to ask questions and all were answered. The patient agreed with the plan and demonstrated an understanding of the instructions.  A copy of instructions were sent to the patient via MyChart unless otherwise noted below.    The patient was advised to call back or seek an  in-person evaluation if the symptoms worsen or if the condition fails to improve as anticipated.    Teena Shuck, [name]

## 2023-11-27 NOTE — Patient Instructions (Addendum)
  Analy [name], thank you for joining Teena Shuck, [name] for today's virtual visit.  While this provider is not your primary care provider (PCP), if your PCP is located in our provider database this encounter information will be shared with them immediately following your visit.   A [facility] MyChart account gives you access to today's visit and all your visits, tests, and labs performed at [facility]  click here if you don't have a [facility] MyChart account or go to [email or web address]  Consent: (Patient) [name] provided verbal consent for this virtual visit at the beginning of the encounter.  Current Medications:  Current Outpatient Medications:    albuterol  (VENTOLIN  HFA) 108 (90 Base) MCG/ACT inhaler, INHALE 2 PUFFS EVERY 6 HOURS AS NEEDED FOR WHEEZE OR SHORTNESS OF BREATH, Disp: 18 each, Rfl: 1   aspirin  325 MG tablet, Take 1 tablet (325 mg total) by mouth daily., Disp: 30 tablet, Rfl: 0   busPIRone  (BUSPAR ) 15 MG tablet, Take 1 tablet (15 mg total) by mouth 2 (two) times daily., Disp: 90 tablet, Rfl: 1   doxycycline  (VIBRA -TABS) 100 MG tablet, Take 1 tablet (100 mg total) by mouth 2 (two) times daily., Disp: 20 tablet, Rfl: 0   escitalopram  (LEXAPRO ) 20 MG tablet, Take 1 tablet (20 mg total) by mouth daily., Disp: 90 tablet, Rfl: 0   fluconazole  (DIFLUCAN ) 150 MG tablet, Take 1 tablet (150 mg total) by mouth every 3 (three) days as needed., Disp: 3 tablet, Rfl: 0   fluticasone  (FLONASE ) 50 MCG/ACT nasal spray, Place 2 sprays into both nostrils daily., Disp: 16 g, Rfl: 0   nicotine  (NICODERM CQ ) 7 mg/24hr patch, Place 1 patch (7 mg total) onto the skin daily., Disp: 28 patch, Rfl: 2   ondansetron  (ZOFRAN -ODT) 4 MG disintegrating tablet, Take 1 tablet (4 mg total) by mouth every 8 (eight) hours as needed., Disp: 20 tablet, Rfl: 0   polyethylene glycol powder (GLYCOLAX /MIRALAX ) 17 GM/SCOOP powder, Take 17 g by mouth daily as needed for mild  constipation., Disp: 500 g, Rfl: 0   Medications ordered in this encounter:  No orders of the defined types were placed in this encounter.    *If you need refills on other medications prior to your next appointment, please contact your pharmacy*  Follow-Up: Call back or seek an in-person evaluation if the symptoms worsen or if the condition fails to improve as anticipated.  [facility] [phone]  Other Instructions  Follow up with primary provider in 24-48 hours. Report to nearest ER with any worsening symptoms.   If you have been instructed to have an in-person evaluation today at a local Urgent Care facility, please use the link below. It will take you to a list of all of our available [facility], including address, phone number and hours of operation. Please do not delay care.  [facility]  If you or a family member do not have a primary care provider, use the link below to schedule a visit and establish care. When you choose a [facility] primary care physician or advanced practice provider, you gain a long-term partner in health. Find a Primary Care Provider  Learn more about [facility]'s in-office and virtual care options:  - Get Care Now

## 2023-12-19 ENCOUNTER — Ambulatory Visit: Admitting: Psychology
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
## 2023-12-20 ENCOUNTER — Telehealth: Admitting: Physician Assistant

## 2023-12-20 DIAGNOSIS — J019 Acute sinusitis, unspecified: Secondary | ICD-10-CM

## 2023-12-20 DIAGNOSIS — B9689 Other specified bacterial agents as the cause of diseases classified elsewhere: Secondary | ICD-10-CM

## 2023-12-20 MED ORDER — DOXYCYCLINE HYCLATE 100 MG PO TABS: 100.0000 mg | ORAL_TABLET | Freq: Two times a day (BID) | ORAL | 0 refills | Status: AC

## 2023-12-20 NOTE — Progress Notes (Signed)
 Virtual Visit Consent   [name], you are scheduled for a virtual visit with a [facility] provider today. Just as with appointments in the office, your consent must be obtained to participate. Your consent will be active for this visit and any virtual visit you may have with one of our providers in the next 365 days. If you have a MyChart account, a copy of this consent can be sent to you electronically.  As this is a virtual visit, video technology does not allow for your provider to perform a traditional examination. This may limit your provider's ability to fully assess your condition. If your provider identifies any concerns that need to be evaluated in person or the need to arrange testing (such as labs, EKG, etc.), we will make arrangements to do so. Although advances in technology are sophisticated, we cannot ensure that it will always work on either your end or our end. If the connection with a video visit is poor, the visit may have to be switched to a telephone visit. With either a video or telephone visit, we are not always able to ensure that we have a secure connection.  By engaging in this virtual visit, you consent to the provision of healthcare and authorize for your insurance to be billed (if applicable) for the services provided during this visit. Depending on your insurance coverage, you may receive a charge related to this service.  I need to obtain your verbal consent now. Are you willing to proceed with your visit today? [name] has provided verbal consent on 12/20/2023 for a virtual visit (video or telephone). Harlene PEDLAR Ward, [name]  Date: 12/20/2023 6:58 PM   Virtual Visit via Video Note   I, Harlene PEDLAR Ward, connected with  [name]  ([id number], [birth year]) on 12/20/23 at  7:00 PM EST by a video-enabled telemedicine application and verified that I am speaking with the correct person using two identifiers.  Location: Patient: Virtual Visit  Location Patient: Home Provider: Virtual Visit Location Provider: Home Office   I discussed the limitations of evaluation and management by telemedicine and the availability of in person appointments. The patient expressed understanding and agreed to proceed.    History of Present Illness: [name] is a 48 y.o. who identifies as a female who was assigned female at birth, and is being seen today for sinus pressure and pain, congestion, cough that started a little more than 1 week ago.  Denies fever, chills, shortness of breath or wheezing.  She has been using the nasal spray prescribed at last visit with minimal relief.  HPI: HPI  Problems:  Patient Active Problem List   Diagnosis Date Noted   Attention deficit disorder 05/30/2023   Tobacco abuse counseling 05/30/2023   Major depression 03/28/2023   Major depressive disorder, recurrent episode, moderate (HCC) 06/07/2022   Respiratory illness 03/30/2022   Restless leg syndrome 03/30/2022   Right knee pain 03/12/2022   Septic joint of right knee joint (HCC) 03/12/2022   Asthma 03/12/2022   Hypokalemia 03/12/2022   Tobacco abuse 03/12/2022   Cellulitis of right lower extremity 03/12/2022   Pain and swelling of right knee 03/04/2022   Constipation 12/18/2021   Cervical cancer screening 05/08/2019   Breast cancer screening by mammogram 05/08/2019   Tennis elbow 11/27/2018   Positive urine drug screen 07/15/2018   Closed left ankle fracture 06/14/2018   Attention deficit 05/08/2018   Acute cough 02/15/2018   Family history of colon  cancer 02/15/2018   Vitamin D  deficiency 09/17/2015   Muscular dystrophy (HCC) 03/26/2015   Neuropathic pain 03/26/2015   Irregular periods 02/26/2015   Allergic rhinitis 02/26/2015   Tooth pain 01/08/2015   Anxiety and depression 12/26/2014   Charcot-Marie-Tooth disease 12/26/2014   Stress incontinence 12/26/2014   Iron deficiency 12/26/2014   Current tobacco use 07/11/2013   Chronic pain  08/25/2012   Foot pain 08/25/2012   Other long term (current) drug therapy 09/30/2011   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   Arthritis, neuropathic 03/18/2011   Closed dislocation of tarsometatarsal joint 03/18/2011   Acquired deformities of toe 03/04/2011   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   Ulcer of right foot (HCC) 12/21/2010    Allergies:  Allergies  Allergen Reactions   Bee Venom Swelling   Vancomycin Shortness Of Breath   Penicillins     Other reaction(s): RASH   Sulfamethoxazole-Trimethoprim Itching   Tramadol Nausea Only    Other reaction(s): NAUSEA   Clindamycin  Nausea Only    And heartburn   Medications:  Current Outpatient Medications:    doxycycline  (VIBRA -TABS) 100 MG tablet, Take 1 tablet (100 mg total) by mouth 2 (two) times daily for 7 days., Disp: 14 tablet, Rfl: 0   albuterol  (VENTOLIN  HFA) 108 (90 Base) MCG/ACT inhaler, INHALE 2 PUFFS EVERY 6 HOURS AS NEEDED FOR WHEEZE OR SHORTNESS OF BREATH, Disp: 18 each, Rfl: 1   aspirin  325 MG tablet, Take 1 tablet (325 mg total) by mouth daily., Disp: 30 tablet, Rfl: 0   azelastine  (ASTELIN ) 0.1 % nasal spray, Place 2 sprays into both nostrils 2 (two) times daily. Use in each nostril as directed, Disp: 30 mL, Rfl: 12   busPIRone  (BUSPAR ) 15 MG tablet, Take 1 tablet (15 mg total) by mouth 2 (two) times daily., Disp: 90 tablet, Rfl: 1   escitalopram  (LEXAPRO ) 20 MG tablet, Take 1 tablet (20 mg total) by mouth daily., Disp: 90 tablet, Rfl: 0   fluconazole  (DIFLUCAN ) 150 MG tablet, Take 1 tablet (150 mg total) by mouth every 3 (three) days as needed., Disp: 3 tablet, Rfl: 0   fluticasone  (FLONASE ) 50 MCG/ACT nasal spray, Place 2 sprays into both nostrils daily., Disp: 16 g, Rfl: 0   levocetirizine (XYZAL  ALLERGY 24HR) 5 MG tablet, Take 1 tablet (5 mg total) by mouth every evening for 14 days., Disp: 14 tablet, Rfl: 0   nicotine  (NICODERM CQ ) 7 mg/24hr patch, Place 1 patch (7 mg total) onto the skin daily., Disp: 28  patch, Rfl: 2   ondansetron  (ZOFRAN -ODT) 4 MG disintegrating tablet, Take 1 tablet (4 mg total) by mouth every 8 (eight) hours as needed., Disp: 20 tablet, Rfl: 0   polyethylene glycol powder (GLYCOLAX /MIRALAX ) 17 GM/SCOOP powder, Take 17 g by mouth daily as needed for mild constipation., Disp: 500 g, Rfl: 0  Observations/Objective: Patient is well-developed, well-nourished in no acute distress.  Resting comfortably  at home.  Head is normocephalic, atraumatic.  No labored breathing.  Speech is clear and coherent with logical content.  Patient is alert and oriented at baseline.    Assessment and Plan: 1. Acute bacterial sinusitis (Primary)  Antibiotic prescribed.  Supportive care discussed.   Follow Up Instructions: I discussed the assessment and treatment plan with the patient. The patient was provided an opportunity to ask questions and all were answered. The patient agreed with the plan and demonstrated an understanding of the instructions.  A copy of instructions were sent to the patient via MyChart unless otherwise noted below.  The patient was advised to call back or seek an in-person evaluation if the symptoms worsen or if the condition fails to improve as anticipated.    Harlene PEDLAR Ward, [name]

## 2023-12-20 NOTE — Patient Instructions (Signed)
 Aliea [name] Mani, thank you for joining Harlene PEDLAR Ward, [name] for today's virtual visit.  While this provider is not your primary care provider (PCP), if your PCP is located in our provider database this encounter information will be shared with them immediately following your visit.   A [facility] MyChart account gives you access to today's visit and all your visits, tests, and labs performed at [facility]  click here if you don't have a [facility] MyChart account or go to [email or web address]  Consent: (Patient) [name] provided verbal consent for this virtual visit at the beginning of the encounter.  Current Medications:  Current Outpatient Medications:    doxycycline  (VIBRA -TABS) 100 MG tablet, Take 1 tablet (100 mg total) by mouth 2 (two) times daily for 7 days., Disp: 14 tablet, Rfl: 0   albuterol  (VENTOLIN  HFA) 108 (90 Base) MCG/ACT inhaler, INHALE 2 PUFFS EVERY 6 HOURS AS NEEDED FOR WHEEZE OR SHORTNESS OF BREATH, Disp: 18 each, Rfl: 1   aspirin  325 MG tablet, Take 1 tablet (325 mg total) by mouth daily., Disp: 30 tablet, Rfl: 0   azelastine  (ASTELIN ) 0.1 % nasal spray, Place 2 sprays into both nostrils 2 (two) times daily. Use in each nostril as directed, Disp: 30 mL, Rfl: 12   busPIRone  (BUSPAR ) 15 MG tablet, Take 1 tablet (15 mg total) by mouth 2 (two) times daily., Disp: 90 tablet, Rfl: 1   escitalopram  (LEXAPRO ) 20 MG tablet, Take 1 tablet (20 mg total) by mouth daily., Disp: 90 tablet, Rfl: 0   fluconazole  (DIFLUCAN ) 150 MG tablet, Take 1 tablet (150 mg total) by mouth every 3 (three) days as needed., Disp: 3 tablet, Rfl: 0   fluticasone  (FLONASE ) 50 MCG/ACT nasal spray, Place 2 sprays into both nostrils daily., Disp: 16 g, Rfl: 0   levocetirizine (XYZAL  ALLERGY 24HR) 5 MG tablet, Take 1 tablet (5 mg total) by mouth every evening for 14 days., Disp: 14 tablet, Rfl: 0   nicotine  (NICODERM CQ ) 7 mg/24hr patch, Place 1 patch (7 mg total) onto the  skin daily., Disp: 28 patch, Rfl: 2   ondansetron  (ZOFRAN -ODT) 4 MG disintegrating tablet, Take 1 tablet (4 mg total) by mouth every 8 (eight) hours as needed., Disp: 20 tablet, Rfl: 0   polyethylene glycol powder (GLYCOLAX /MIRALAX ) 17 GM/SCOOP powder, Take 17 g by mouth daily as needed for mild constipation., Disp: 500 g, Rfl: 0   Medications ordered in this encounter:  Meds ordered this encounter  Medications   doxycycline  (VIBRA -TABS) 100 MG tablet    Sig: Take 1 tablet (100 mg total) by mouth 2 (two) times daily for 7 days.    Dispense:  14 tablet    Refill:  0    Supervising Provider:   BLAISE ALEENE KIDD [[id number]]     *If you need refills on other medications prior to your next appointment, please contact your pharmacy*  Follow-Up: Call back or seek an in-person evaluation if the symptoms worsen or if the condition fails to improve as anticipated.  [facility] [phone]  Other Instructions Take antibiotic as prescribed.  Continue with nasal spray.  Can take tylenol  or ibuprofen as needed.  If no improvement recommended in person evaluation with primary care physician or Urgent Care.    If you have been instructed to have an in-person evaluation today at a local Urgent Care facility, please use the link below. It will take you to a list of all of our available [facility]  Urgent Cares, including address, phone number and hours of operation. Please do not delay care.  [facility]  If you or a family member do not have a primary care provider, use the link below to schedule a visit and establish care. When you choose a [facility] primary care physician or advanced practice provider, you gain a long-term partner in health. Find a Primary Care Provider  Learn more about [facility]'s in-office and virtual care options: [facility] - Get Care Now

## 2024-01-02 ENCOUNTER — Ambulatory Visit (INDEPENDENT_AMBULATORY_CARE_PROVIDER_SITE_OTHER): Admitting: Psychology

## 2024-01-02 DIAGNOSIS — F902 Attention-deficit hyperactivity disorder, combined type: Secondary | ICD-10-CM

## 2024-01-02 DIAGNOSIS — F339 Major depressive disorder, recurrent, unspecified: Secondary | ICD-10-CM
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
## 2024-01-14 ENCOUNTER — Telehealth: Admitting: Family Medicine

## 2024-01-14 DIAGNOSIS — B9689 Other specified bacterial agents as the cause of diseases classified elsewhere: Secondary | ICD-10-CM

## 2024-01-14 DIAGNOSIS — J019 Acute sinusitis, unspecified: Secondary | ICD-10-CM

## 2024-01-14 DIAGNOSIS — R051 Acute cough: Secondary | ICD-10-CM | POA: Diagnosis not present

## 2024-01-14 MED ORDER — DOXYCYCLINE HYCLATE 100 MG PO TABS: 100.0000 mg | ORAL_TABLET | Freq: Two times a day (BID) | ORAL | 0 refills | Status: AC

## 2024-01-14 NOTE — Progress Notes (Signed)
 E-Visit for Sinus Problems  We are sorry that you are not feeling well.  Here is how we plan to help!  Based on what you have shared with me it looks like you have sinusitis.  Sinusitis is inflammation and infection in the sinus cavities of the head.  Based on your presentation I believe you most likely have Acute Bacterial Sinusitis.  This is an infection caused by bacteria and is treated with antibiotics. I have prescribed Doxycycline  100mg  by mouth twice a day for 7 days. You may use an oral decongestant such as Mucinex  D or if you have glaucoma or high blood pressure use plain Mucinex . Saline nasal spray help and can safely be used as often as needed for congestion.  If you develop worsening sinus pain, fever or notice severe headache and vision changes, or if symptoms are not better after completion of antibiotic, please schedule an appointment with a health care provider.    Sinus infections are not as easily transmitted as other respiratory infection, however we still recommend that you avoid close contact with loved ones, especially the very young and elderly.  Remember to wash your hands thoroughly throughout the day as this is the number one way to prevent the spread of infection!  Home Care: Only take medications as instructed by your medical team. Complete the entire course of an antibiotic. Do not take these medications with alcohol. A steam or ultrasonic humidifier can help congestion.  You can place a towel over your head and breathe in the steam from hot water  coming from a faucet. Avoid close contacts especially the very young and the elderly. Cover your mouth when you cough or sneeze. Always remember to wash your hands.  Get Help Right Away If: You develop worsening fever or sinus pain. You develop a severe head ache or visual changes. Your symptoms persist after you have completed your treatment plan.  Make sure you Understand these instructions. Will watch your  condition. Will get help right away if you are not doing well or get worse.  Your e-visit answers were reviewed by a board certified advanced clinical practitioner to complete your personal care plan.  Depending on the condition, your plan could have included both over the counter or prescription medications.  If there is a problem please reply  once you have received a response from your provider.  Your safety is important to us .  If you have drug allergies check your prescription carefully.    You can use MyChart to ask questions about today's visit, request a non-urgent call back, or ask for a work or school excuse for 24 hours related to this e-Visit. If it has been greater than 24 hours you will need to follow up with your provider, or enter a new e-Visit to address those concerns.  You will get an e-mail in the next two days asking about your experience.  I hope that your e-visit has been valuable and will speed your recovery. Thank you for using e-visits.  I have spent 5 minutes in review of e-visit questionnaire, review and updating patient chart, medical decision making and response to patient.   Shenna Brissette, FNP

## 2024-01-16 ENCOUNTER — Ambulatory Visit: Admitting: Psychology

## 2024-01-30 ENCOUNTER — Ambulatory Visit: Admitting: Psychology

## 2024-01-30 DIAGNOSIS — F902 Attention-deficit hyperactivity disorder, combined type: Secondary | ICD-10-CM
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
## 2024-02-13 ENCOUNTER — Ambulatory Visit: Admitting: Psychology
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
## 2024-02-20 ENCOUNTER — Ambulatory Visit: Payer: Medicare HMO

## 2024-02-21 ENCOUNTER — Ambulatory Visit

## 2024-02-27 ENCOUNTER — Ambulatory Visit: Admitting: Psychology

## 2024-03-12 ENCOUNTER — Ambulatory Visit: Admitting: Psychology

## 2024-03-26 ENCOUNTER — Ambulatory Visit: Admitting: Psychology

## 2024-04-09 ENCOUNTER — Ambulatory Visit: Admitting: Psychology

## 2024-04-23 ENCOUNTER — Ambulatory Visit: Admitting: Psychology

## 2024-05-07 ENCOUNTER — Ambulatory Visit: Admitting: Psychology

## 2024-05-21 ENCOUNTER — Ambulatory Visit: Admitting: Psychology
